# Patient Record
Sex: Male | Born: 1950
Health system: Southern US, Community
[De-identification: ages and names within clinical notes are randomized; demographics above are authoritative.]

## PROBLEM LIST (undated history)

## (undated) DIAGNOSIS — D649 Anemia, unspecified: Secondary | ICD-10-CM

## (undated) DIAGNOSIS — I2510 Atherosclerotic heart disease of native coronary artery without angina pectoris: Secondary | ICD-10-CM

## (undated) DIAGNOSIS — I6529 Occlusion and stenosis of unspecified carotid artery: Secondary | ICD-10-CM

## (undated) DIAGNOSIS — E559 Vitamin D deficiency, unspecified: Secondary | ICD-10-CM

## (undated) DIAGNOSIS — I10 Essential (primary) hypertension: Secondary | ICD-10-CM

## (undated) DIAGNOSIS — E785 Hyperlipidemia, unspecified: Secondary | ICD-10-CM

## (undated) DIAGNOSIS — E119 Type 2 diabetes mellitus without complications: Secondary | ICD-10-CM

## (undated) HISTORY — DX: Occlusion and stenosis of unspecified carotid artery: I65.29

## (undated) HISTORY — DX: Hyperlipidemia, unspecified: E78.5

## (undated) HISTORY — DX: Vitamin D deficiency, unspecified: E55.9

## (undated) HISTORY — DX: Anemia, unspecified: D64.9

## (undated) HISTORY — DX: Essential (primary) hypertension: I10

## (undated) HISTORY — DX: Type 2 diabetes mellitus without complications: E11.9

## (undated) HISTORY — DX: Atherosclerotic heart disease of native coronary artery without angina pectoris: I25.10

---

## 2000-09-17 ENCOUNTER — Encounter: Admission: RE | Admit: 2000-09-17 | Discharge: 2000-09-17 | Payer: Self-pay | Admitting: Gastroenterology

## 2000-09-17 ENCOUNTER — Encounter: Payer: Self-pay | Admitting: Gastroenterology

## 2000-12-19 ENCOUNTER — Encounter (INDEPENDENT_AMBULATORY_CARE_PROVIDER_SITE_OTHER): Payer: Self-pay | Admitting: *Deleted

## 2000-12-19 ENCOUNTER — Ambulatory Visit (HOSPITAL_COMMUNITY): Admission: RE | Admit: 2000-12-19 | Discharge: 2000-12-19 | Payer: Self-pay | Admitting: Gastroenterology

## 2003-04-13 ENCOUNTER — Ambulatory Visit (HOSPITAL_COMMUNITY): Admission: RE | Admit: 2003-04-13 | Discharge: 2003-04-13 | Payer: Self-pay | Admitting: Internal Medicine

## 2004-04-18 ENCOUNTER — Ambulatory Visit (HOSPITAL_COMMUNITY): Admission: RE | Admit: 2004-04-18 | Discharge: 2004-04-18 | Payer: Self-pay | Admitting: Internal Medicine

## 2006-03-11 HISTORY — PX: CORONARY ARTERY BYPASS GRAFT: SHX141

## 2006-07-24 ENCOUNTER — Emergency Department (HOSPITAL_COMMUNITY): Admission: EM | Admit: 2006-07-24 | Discharge: 2006-07-24 | Payer: Self-pay | Admitting: Emergency Medicine

## 2006-08-05 ENCOUNTER — Encounter: Payer: Self-pay | Admitting: Cardiology

## 2006-08-29 ENCOUNTER — Inpatient Hospital Stay (HOSPITAL_COMMUNITY): Admission: AD | Admit: 2006-08-29 | Discharge: 2006-09-08 | Payer: Self-pay | Admitting: Cardiology

## 2006-08-29 ENCOUNTER — Ambulatory Visit: Payer: Self-pay

## 2006-08-29 ENCOUNTER — Ambulatory Visit: Payer: Self-pay | Admitting: Cardiology

## 2006-09-01 ENCOUNTER — Encounter: Payer: Self-pay | Admitting: Cardiology

## 2006-09-03 ENCOUNTER — Ambulatory Visit: Payer: Self-pay | Admitting: Thoracic Surgery (Cardiothoracic Vascular Surgery)

## 2006-09-03 ENCOUNTER — Encounter: Payer: Self-pay | Admitting: Thoracic Surgery (Cardiothoracic Vascular Surgery)

## 2006-09-29 ENCOUNTER — Ambulatory Visit: Payer: Self-pay | Admitting: Thoracic Surgery (Cardiothoracic Vascular Surgery)

## 2006-09-29 ENCOUNTER — Encounter
Admission: RE | Admit: 2006-09-29 | Discharge: 2006-09-29 | Payer: Self-pay | Admitting: Thoracic Surgery (Cardiothoracic Vascular Surgery)

## 2006-09-29 ENCOUNTER — Ambulatory Visit: Payer: Self-pay | Admitting: Cardiology

## 2006-10-09 ENCOUNTER — Encounter (HOSPITAL_COMMUNITY): Admission: RE | Admit: 2006-10-09 | Discharge: 2007-01-07 | Payer: Self-pay | Admitting: Cardiology

## 2006-12-16 ENCOUNTER — Ambulatory Visit: Payer: Self-pay | Admitting: Cardiology

## 2007-04-03 ENCOUNTER — Inpatient Hospital Stay (HOSPITAL_COMMUNITY): Admission: EM | Admit: 2007-04-03 | Discharge: 2007-04-05 | Payer: Self-pay | Admitting: Emergency Medicine

## 2007-04-04 ENCOUNTER — Encounter (INDEPENDENT_AMBULATORY_CARE_PROVIDER_SITE_OTHER): Payer: Self-pay | Admitting: Internal Medicine

## 2007-04-20 ENCOUNTER — Ambulatory Visit: Payer: Self-pay | Admitting: Cardiology

## 2007-05-06 ENCOUNTER — Ambulatory Visit: Payer: Self-pay

## 2008-01-07 ENCOUNTER — Ambulatory Visit: Payer: Self-pay | Admitting: Cardiology

## 2008-01-28 ENCOUNTER — Ambulatory Visit: Payer: Self-pay

## 2008-01-28 ENCOUNTER — Encounter: Payer: Self-pay | Admitting: Cardiology

## 2008-01-28 ENCOUNTER — Encounter: Payer: Self-pay | Admitting: Cardiovascular Disease

## 2008-01-30 IMAGING — CR DG CHEST 2V
2 series · 2 of 2 positions shown · non-contrast
Comparison: 09/06/06.

CLINICAL DATA: Chest pain.  CABG three weeks ago.
 TWO VIEW CHEST:

[view not recorded (1 of 2)]
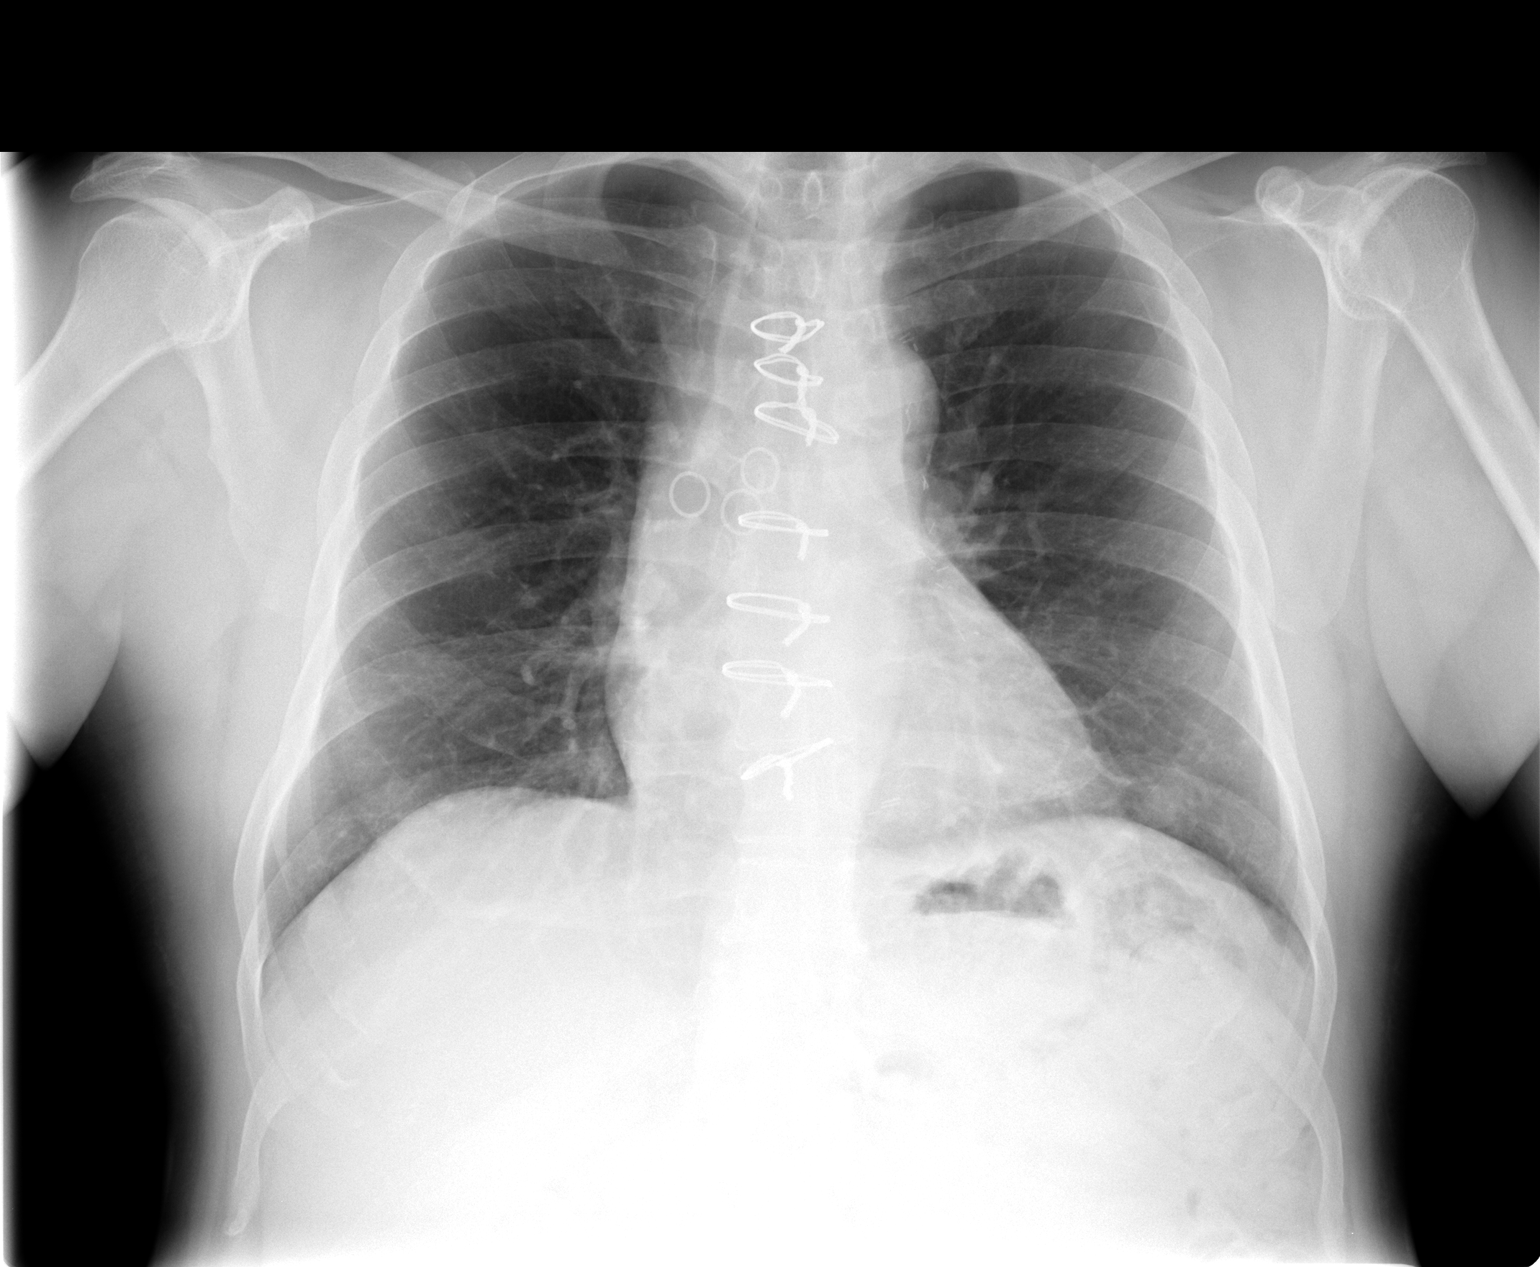

[view not recorded (2 of 2)]
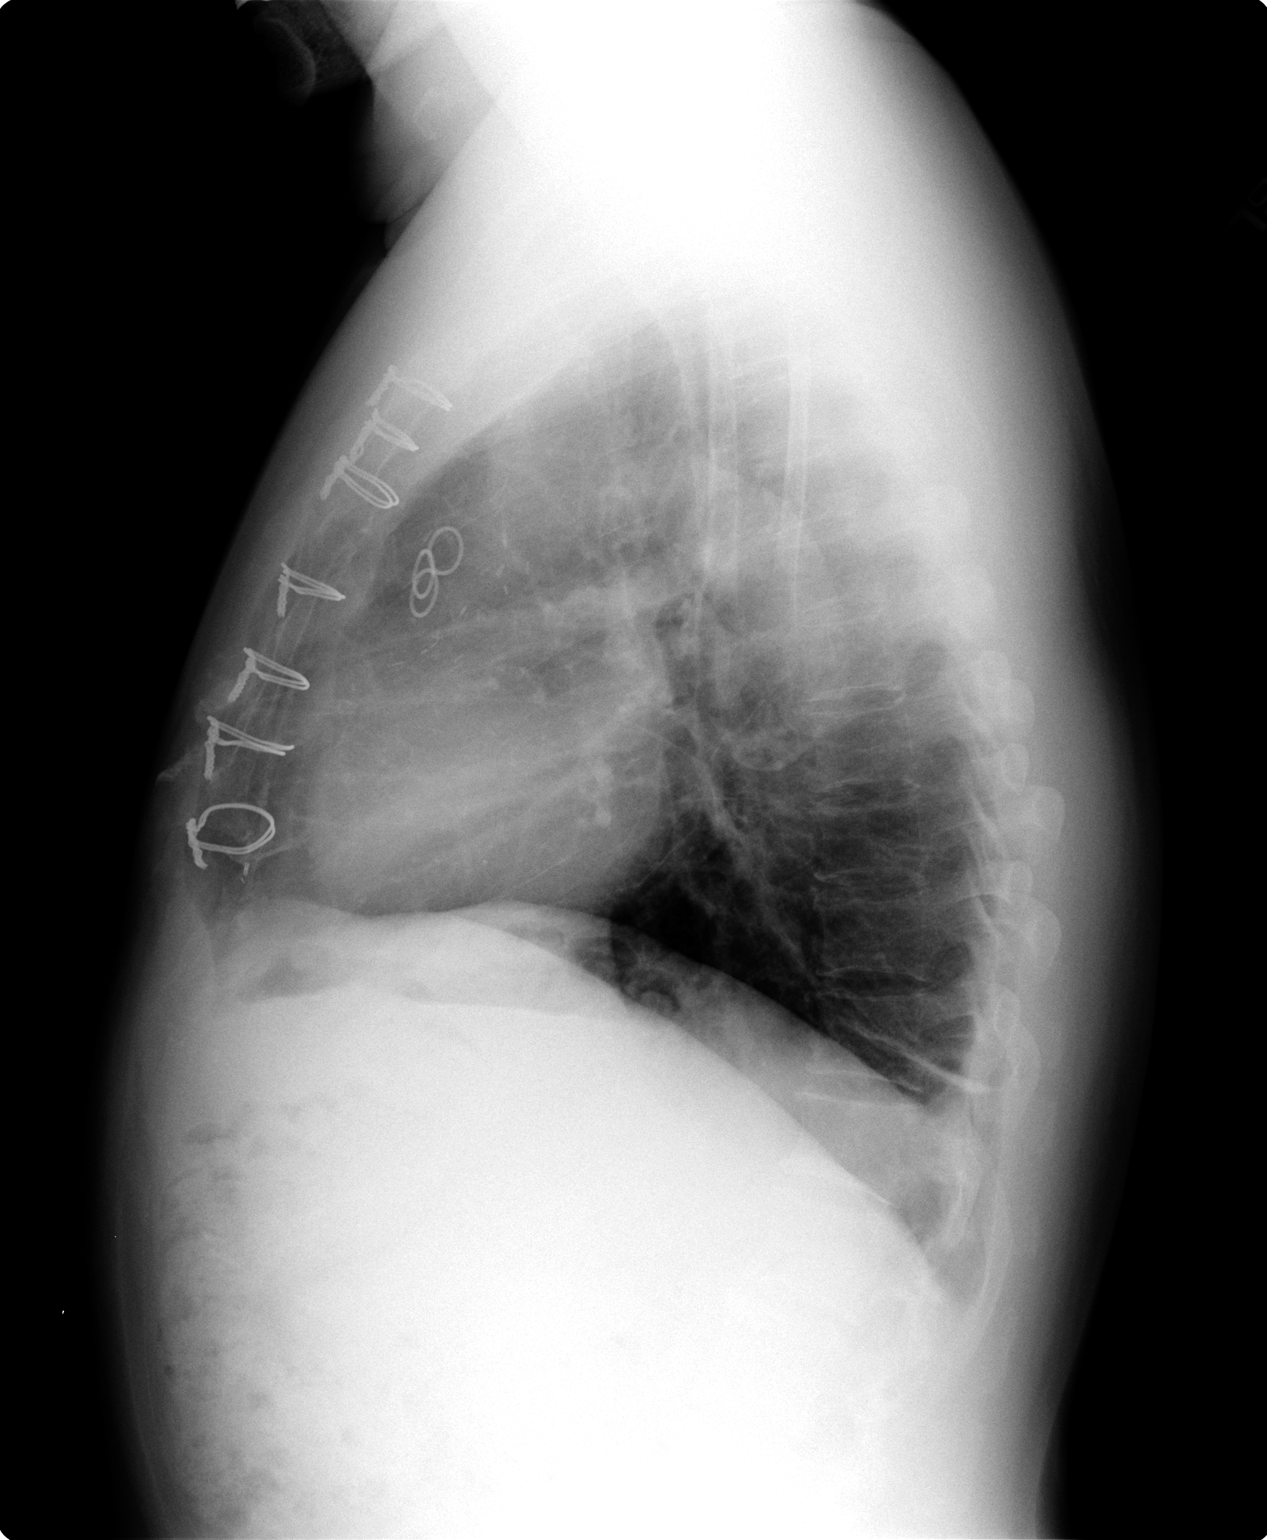

[2 of 2 positions shown; findings below may reference images not displayed]

FINDINGS: Trachea is midline.  Heart size normal.  Sternal wires are unchanged in position and alignment.  Left basilar scarring.  Lungs are otherwise clear.
IMPRESSION: No acute findings.

## 2008-09-15 ENCOUNTER — Encounter: Payer: Self-pay | Admitting: Cardiology

## 2010-03-01 DIAGNOSIS — I6529 Occlusion and stenosis of unspecified carotid artery: Secondary | ICD-10-CM | POA: Insufficient documentation

## 2010-03-01 DIAGNOSIS — I2510 Atherosclerotic heart disease of native coronary artery without angina pectoris: Secondary | ICD-10-CM

## 2010-03-01 DIAGNOSIS — E782 Mixed hyperlipidemia: Secondary | ICD-10-CM

## 2010-03-07 ENCOUNTER — Ambulatory Visit: Payer: Self-pay | Admitting: Cardiology

## 2010-03-07 ENCOUNTER — Encounter: Payer: Self-pay | Admitting: Cardiology

## 2010-03-28 ENCOUNTER — Encounter: Payer: Self-pay | Admitting: Cardiology

## 2010-03-28 ENCOUNTER — Ambulatory Visit: Admission: RE | Admit: 2010-03-28 | Discharge: 2010-03-28 | Payer: Self-pay | Source: Home / Self Care

## 2010-03-29 ENCOUNTER — Telehealth (INDEPENDENT_AMBULATORY_CARE_PROVIDER_SITE_OTHER): Payer: Self-pay | Admitting: *Deleted

## 2010-04-12 NOTE — Letter (Signed)
Summary: [facility]  [facility]   Imported By: Marylou Mccoy 03/22/2010 09:49:18  _____________________________________________________________________  External Attachment:    Type:   Image     Comment:   External Document

## 2010-04-12 NOTE — Assessment & Plan Note (Signed)
Summary: 57YR F/U MEDICATION REFILL/SL  Medications Added GLIMEPIRIDE 4 MG TABS (GLIMEPIRIDE) 1 1/2 by mouth daily METFORMIN HCL 500 MG TABS (METFORMIN HCL) 2 by mouth two times a day CRESTOR 20 MG TABS (ROSUVASTATIN CALCIUM) 1 by mouth daily WELCHOL 625 MG TABS (COLESEVELAM HCL) 3 tab by mouth two times a day ASPIRIN 81 MG  TABS (ASPIRIN) 1 by mouth daily      Allergies Added: NKDA  Visit Type:  Follow-up Primary Provider:  Dr. Marisue Brooklyn  CC:  CAD.  History of Present Illness: The patient presents for followup of his known coronary disease. Since I last saw him he has had no new cardiovascular complaints. It seems like he participates for the most part and risk reduction. He doesn't smoke. He has his lipids and diabetes followed. He doesn't exercise routinely though he walks routinely at work. He denies any ongoing symptoms such as chest pressure, neck or arm discomfort. He has no shortness of breath, PND or orthopnea. He has no palpitations, presyncope or syncope. He has had no weight gain or edema. He does complain of sneezing and sinus problems since his surgery. This has been ongoing for a year and very bothersome to him.  Current Medications (verified): 1)  Metoprolol Tartrate 25 Mg Tabs (Metoprolol Tartrate) .[name].. 1 By Mouth Two Times A Day 2)  Glimepiride 4 Mg Tabs (Glimepiride) .[name].. 1 1/2 By Mouth Daily 3)  Metformin Hcl 500 Mg Tabs (Metformin Hcl) .... 2 By Mouth Two Times A Day 4)  Crestor 20 Mg Tabs (Rosuvastatin Calcium) .[name].. 1 By Mouth Daily 5)  Welchol 625 Mg Tabs (Colesevelam Hcl) .... 3 Tab By Mouth Two Times A Day 6)  Aspirin 81 Mg  Tabs (Aspirin) .[name].. 1 By Mouth Daily  Allergies (verified): No Known Drug Allergies  Past History:  Past Medical History: Coronary artery disease 60-80% right internal carotid  artery stenosis Dyslipidemia,  Iron-deficiency anemia.   Past Surgical History: CABG (LIMA to LAD, SVG to OM, SVG to posterior or lateral, SVG to  diagonal 2008)  Review of Systems       As stated in the HPI and negative for all other systems.   Vital Signs:  Patient profile:   60 year old male Height:      61 inches Weight:      136 pounds BMI:     25.79 Pulse rate:   97 / minute Resp:     16 per minute BP sitting:   152 / 88  (right arm)  Vitals Entered By: Marrion Coy, CNA (March 07, 2010 9:58 AM)  Physical Exam  General:  Well developed, well nourished, in no acute distress. Head:  normocephalic and atraumatic Eyes:  PERRLA/EOM intact; conjunctiva and lids normal. Mouth:  Teeth, gums and palate normal. Oral mucosa normal. Neck:  Neck supple, no JVD. No masses, thyromegaly or abnormal cervical nodes. Chest Wall:  Well-healed sternotomy scar Lungs:  Clear bilaterally to auscultation and percussion. Abdomen:  Bowel sounds positive; abdomen soft and non-tender without masses, organomegaly, or hernias noted. No hepatosplenomegaly. Msk:  Back normal, normal gait. Muscle strength and tone normal. Extremities:  No clubbing or cyanosis. Neurologic:  Alert and oriented x 3. Skin:  Intact without lesions or rashes. Cervical Nodes:  no significant adenopathy Inguinal Nodes:  no significant adenopathy Psych:  Normal affect.   Detailed Cardiovascular Exam  Neck    Carotids: Carotids full and equal bilaterally without bruits.      Neck Veins: Normal, no  JVD.    Heart    Inspection: no deformities or lifts noted.      Palpation: normal PMI with no thrills palpable.      Auscultation: regular rate and rhythm, S1, S2 without murmurs, rubs, gallops, or clicks.    Vascular    Abdominal Aorta: no palpable masses, pulsations, or audible bruits.      Femoral Pulses: normal femoral pulses bilaterally.      Pedal Pulses: normal pedal pulses bilaterally.      Radial Pulses: normal radial pulses bilaterally.      Peripheral Circulation: no clubbing, cyanosis, or edema noted with normal capillary refill.      EKG  Procedure date:  03/07/2010  Findings:      Sinus rhythm, rate 101, old inferior infarct with probable posterior involvement, inferior and lateral T-wave inversions  Impression & Recommendations:  Problem # 1:  CAD (ICD-414.00) The patient has had no new anginal symptoms. We will continue with risk reduction. For that and I did discuss with him at length the benefits of walking. He tells me he has his diabetes and lipids followed closely by Dr. Elisabeth Most.  I will defer to her management with an LDL goal less than 70 and HDL greater than 40. Orders: EKG w/ Interpretation (93000)  Problem # 2:  CAROTID STENOSIS (ICD-433.10) He is overdue for carotid Dopplers. I have arrange for these to be done and discussed with him the importance of followup and management of this. He will continue with risk reduction. Orders: Carotid Duplex (Carotid Duplex)  Problem # 3:  DYSLIPIDEMIA (ICD-272.4) Lipid goals are listed above.  Problem # 4:  SINUS PAIN (ICD-478.19) This was his predominant complaint. I assured him that I did not think this was related to his cardiac situation. I have offered to refer him to an ENT and he will discuss a referral with his primary physician as he sure his insurance will require this.  Patient Instructions: 1)  Your physician recommends that you schedule a follow-up appointment in: 18 months with Dr Antoine Poche 2)  Your physician recommends that you continue on your current medications as directed. Please refer to the Current Medication list given to you today. 3)  Your physician has requested that you have a carotid duplex. This test is an ultrasound of the carotid arteries in your neck. It looks at blood flow through these arteries that supply the brain with blood. Allow one hour for this exam. There are no restrictions or special instructions.

## 2010-04-12 NOTE — Progress Notes (Signed)
   Walk in Patient Form Recieved " Pt needs Refill on Metoprolol" sent to [facility] [place]  March 29, 2010 9:40 AM

## 2010-07-24 NOTE — H&P (Signed)
William Haney, LOCKLIN NO.:  [id number]   MEDICAL RECORD NO.:  [id number]          PATIENT TYPE:  INP   LOCATION:  3743                         FACILITY:  [facility]   PHYSICIAN:  Rollene Rotunda, MD, FACCDATE OF BIRTH:  1950/08/10   DATE OF ADMISSION:  08/29/2006  DATE OF DISCHARGE:                              HISTORY & PHYSICAL   PRIMARY CARE PHYSICIAN:  Lovenia Kim, D.O.   REASON FOR PRESENTATION:  The patient was referred for exercise  treadmill test, abnormal EKG and multiple cardiovascular risk factors.  He is 60 years old.  Has had no prior cardiac history.  However, his EKG  recently demonstrated T-wave inversions in leads II, III and aVF with  possible old inferior infarct and premature ectopic complexes.  The  patient did not describe any symptoms.  He says he does some exercising  with push-ups but does not exercise routinely.  He denies any chest  pressure, neck discomfort, arm discomfort, activity  induced nausea and  vomiting, excessive  diaphoresis.  He has no shortness of breath, PND or  orthopnea.   He was on the treadmill for only the first stage of Bruce protocol.  This was for 4.7 METS and 3 minutes.  His heart rate went up very  quickly to 166 beats per minute.  He actually had a blood pressure drop  from an initial of 120 to 111 systolic.  She quickly developed ST-  segment depression in the anterior leads.  These persisted into  recovery.  He had no chest pain or shortness of breath.  Because of  this, the a treadmill test was stopped.   PAST MEDICAL HISTORY:  1. The patient has had diabetes for 10 years and seems to be poorly      controlled.  2. He also has dyslipidemia.  3. Iron-deficiency anemia.   PAST SURGICAL HISTORY:  None.   ALLERGIES:  None.   MEDICATIONS:  1. Aspirin 81 mg daily.  2. Crestor 20 mg daily.  3. Avandia 4 mg daily.  4. Glimepiride 6 mg daily.   SOCIAL HISTORY:  Works at [employer].  He is married and has  four  children.  Likes gardening.  He has smoked one half pack cigarettes per  day for 30 years, quitting 6 months ago.   FAMILY HISTORY:  Noncontributory for early coronary disease though his  father had heart disease in the 57s.   REVIEW OF SYSTEMS:  As stated in the HPI and positive for pain in of  both his knees.  Negative for all other systems.   PHYSICAL EXAMINATION:  GENERAL:  The patient is in no distress.  VITAL SIGNS:  Blood pressure 136/84, heart rate 99 and regular.  HEENT:  Pupils equal, round, reactive.  Fundi within normal.  Mucosa  normal.  NECK:  No jugular distension at 45 degrees.  Carotid upstroke brisk and  symmetrical.  No bruits, thyromegaly.  LYMPHATICS:  No cervical, axillary, inguinal.  LUNGS:  Clear to auscultation bilaterally.  BACK:  No costovertebral angle tenderness.  CHEST:  Unremarkable.  HEART:  PMI not displaced or sustained.  S1-S2 within normal limits.  No  S3, no S4, no clicks, no rubs, no murmurs.  ABDOMEN:  Flat.  Positive bowel sounds.  Normal frequency and pitch.  No  bruits, rebound, guarding.  No midline pulsatile mass.  No hepatomegaly.  No splenomegaly.  SKIN:  No rashes, no nodules.  EXTREMITIES:  2+ pulses throughout.  No edema.  No cyanosis or clubbing.  NEUROLOGIC:  Oriented to person, place, and time.  Cranial nerves  grossly intact.  Motor grossly intact.   ASSESSMENT/PLAN:  Abnormal treadmill test.  The patient had an early  positive abnormal treadmill test.  He dropped his blood pressure.  He  had ST-segment depression in the anterior leads.  He has significant  cardiovascular risk factors and a baseline abnormal EKG.  At this point  I am highly suspicious of high-grade obstructive coronary disease and I  am concerned about multivessel disease or left main.  He is not having  any symptoms.  I think it is most prudent and rule out myocardial  infarction. based on his persistent EKG changes.  He will be on heparin  and aspirin.   We will manage him with beta blockers and check risk  factors with a lipid profile.  He will need cardiac catheterization.  I  have extensively outlined the risks and benefits of this and he agrees  to proceed.  1. Dyslipidemia.  Will check a lipid profile.  Continue his Crestor      for now.  2. Diabetes.  He will continue his oral medicines.  Will cover with      sliding scale.  3. Tobacco.  He will continue to be educated about the need to remain      off of cigarettes.  4. Follow-up will be in the hospital as described.      Rollene Rotunda, MD, [facility]  Electronically Signed     JH/MEDQ  D:  08/29/2006  T:  08/30/2006  Job:  [id number]

## 2010-07-24 NOTE — Discharge Summary (Signed)
NAMEFLOYED, MASOUD NO.:  [id number]   MEDICAL RECORD NO.:  [id number]          PATIENT TYPE:  INP   LOCATION:  2005                         FACILITY:  [facility]   PHYSICIAN:  Salvatore Decent. Cornelius Moras, M.D. DATE OF BIRTH:  [birth year]   DATE OF ADMISSION:  08/29/2006  DATE OF DISCHARGE:  09/08/2006                               DISCHARGE SUMMARY   PRIMARY ADMITTING DIAGNOSIS:  Coronary artery disease.   ADDITIONAL/DISCHARGE DIAGNOSES:  1. Severe three-vessel coronary artery disease.  2. Hyperlipidemia.  3. Type 2 diabetes mellitus.  4 . Remote history of tobacco abuse.  1. A 60-80% right internal carotid artery stenosis.   PROCEDURES PERFORMED:  1. Cardiac catheterization.  2. Coronary artery bypass grafting x4 (left internal mammary artery to      the distal LAD, saphenous vein graft to the first diagonal,      saphenous vein graft to the circumflex marginal, second vein graft      to the posterolateral).  3. Endoscopic vein harvest right leg.   HISTORY OF PRESENT ILLNESS:  The patient is a 60 year old male with no  prior noted cardiac history.  He recently was seen by his family  physician for routine follow-up and was normal was noted to have EKG  abnormalities suggestive of an old inferior wall myocardial infarction.  He was subsequently referred for an exercise treadmill Cardiolite exam  which was positive and was stopped early in the first stage.  He was  seen by Dr. Antoine Poche and it was felt that he should be admitted for  cardiac catheterization and further cardiac workup.   HOSPITAL COURSE:  He was admitted to [facility] on August 29, 2006.  He did rule out for myocardial infarction.  Because of his  positive stress test, he underwent cardiac catheterization of September 01, 2006, and this showed severe three-vessel coronary artery disease with  moderate left ventricular dysfunction.  A cardiothoracic surgery  consultation was obtained, and the patient  was seen by Dr. Tressie Stalker  who also reviewed his films.  Dr. Cornelius Moras felt that his best course of  action would be to proceed with surgical revascularization at this time  given the diffuse nature of his disease.  He explained the risks,  benefits and alternatives of surgery to the patient, and he agreed to  proceed.  He remained pain free in the hospital prior to his surgery.  His preoperative workup included carotid Doppler studies which showed a  60-80% right ICA stenosis and no significant left ICA stenosis.  Also,  ABIs were greater than 1.0 bilaterally.  Pulmonary function studies  showed a normal FEV-1.  He was taken to the operating room on September 03, 2006, and underwent CABG x4 as described in detail above.  He tolerated  the procedure well and was transferred to the SICU in stable condition.  He was able to be extubated shortly after surgery.  He was  hemodynamically stable and doing well on postop day #1.  He did  initially require a low-dose Neo-Synephrine drip.  This was weaned and  discontinued over the course of his first postoperative day.  Overall,  his postoperative course has been uneventful.  His blood pressure has  remained in the 100 systolic range, and although he has been started on  a beta blocker and is tolerating it well, his blood pressure has not  been high enough to warrant starting an ACE inhibitor.  He has been  restarted on his statin therapy.  He was initially maintained on the  glucomander insulin protocol, and once his p.o. intake had begun, he was  started on Lantus and sliding scale insulin.  His preoperative  hemoglobin A1c was 8.0.  At this point, he has been restarted back on  his oral medications, although there has been some confusion as to what  he was actually taking at home.  His wife has brought in his home  medication bottles, and they have been subsequently clarified.  Despite  an oral agent, low-dose Lantus and sliding scale insulin has  continued  to run somewhat elevated CBG's in the 180-200 range and has now been  started on metformin.  She did have an episode of low grade fever the  evening of postop day #4 and early in the a.m. on postop day #5.  This  has subsequently resolved, and his white blood cell count has remained  normal.  He has no signs or symptoms on physical exam of infection.  He  is overall progressing well.  He is ambulating in the halls without  difficulty.  He has remained in sinus rhythm.  He has been diuresed back  down to his preoperative weight and has trace lower extremity edema on  physical exam.  He has been seen and evaluated by Dr. Cornelius Moras on the  morning of postop day #5, and it is felt that this time he may be  discharged home.  His most recent labs showed hemoglobin of 10.1,  hematocrit 31.4, white count 8.3, platelets 359, sodium 137, potassium  4.1, BUN 4, creatinine 0.72.   DISCHARGE MEDICATIONS:  1. Enteric-coated aspirin 81 mg daily.  2. Lopressor 25 mg b.i.d.  3. Crestor 20 mg q.h.s.  4. Lasix 40 mg daily x3 days.  5. K-Dur 20 mEq daily x3 days.  6. Metformin 500 mg daily.  7. Amaryl 6 mg daily.  8. Tylox one to two q.4 h p.r.n. for pain.   DISCHARGE INSTRUCTIONS:  He is asked to refrain from driving, heavy  lifting or strenuous activity.  He may continue ambulating daily and  using his incentive spirometer.  He may shower daily and clean his  incisions with soap and water.  He will continue low-fat, low-sodium,  carbohydrate modified diet.   DISCHARGE FOLLOWUP:  He will see Dr. Antoine Poche back in 2 weeks with a  chest x-ray.  He will see Dr. Cornelius Moras back the following week and will be  contacted by the [facility] office with an appointment.  He is asked to make  an appointment with Dr. Carmela Hurt in 1-2 weeks to recheck his blood  sugars.  In the interim, if he experiences any problems or has questions, he is asked to contact our office immediately.      Coral Ceo,  P.A.      Salvatore Decent. Cornelius Moras, M.D.  Electronically Signed    GC/MEDQ  D:  09/08/2006  T:  09/08/2006  Job:  [id number]   cc:   Rollene Rotunda, MD, [facility]  Lovenia Kim, D.O.  [facility] Office

## 2010-07-24 NOTE — Letter (Signed)
December 16, 2006     RE:  DANZIG, MACGREGOR  MRN:  [id number]  /  DOB:  Jul 07, 1950   To Whom it May Concern:   Mr. Hamid Brookens has been under my care with coronary disease requiring  bypass surgery.  He has had a good recovery and has participated in  cardiac rehab.  He is now able to return to work full time without  limitations.   Thank you for your attention to this matter.  If you have any questions,  please do not hesitate to call my office at [phone].    Sincerely,      Rollene Rotunda, MD, [facility]  Electronically Signed    JH/MedQ  DD: 12/16/2006  DT: 12/17/2006  Job #: [id number]

## 2010-07-24 NOTE — H&P (Signed)
NAMEPEPPER, WYNDHAM                ACCOUNT NO.:  [id number]   MEDICAL RECORD NO.:  [id number]          PATIENT TYPE:  INP   LOCATION:  3706                         FACILITY:  [facility]   PHYSICIAN:  Ladell Pier, M.D.   DATE OF BIRTH:  [birth year]   DATE OF ADMISSION:  04/03/2007  DATE OF DISCHARGE:                              HISTORY & PHYSICAL   CHIEF COMPLAINT:  Syncopal episode at [facility] today.   HISTORY OF PRESENT ILLNESS:  The patient is a 60 year old gentleman who  stated that he was at [employer] today shopping.  He suddenly just felt  blurred vision as if he was going to pass out.  That has happened to him  in the past when his blood sugar was low.  He tried to get something to  drink but before that, when he woke up, the ambulance was there.  He is  not sure how long he was out for.  He had no chest pain, no shortness of  breath.  The last time he passed out was in 2001 and that time it was  from low blood sugars.  He states that his blood sugar was checked and  it was 101.  Did not bite his tongue, did not lose control of his  bladder.  Does not think he was confused after the event.  No recent  travel.   PAST MEDICAL HISTORY:  Significant for coronary artery disease status  post bypass surgery.  He also has a history of 60-80% right internal  carotid artery stenosis.  He has a mildly reduced ejection fraction of  45%.  He also has dyslipidemia, iron-deficiency anemia, and diabetes.   FAMILY HISTORY:  Parents are both deceased, history unknown.   SOCIAL HISTORY:  Quit tobacco about a year ago.  No alcohol use.  He is  married with four children.  Works in Journalist, newspaper.   MEDICATIONS:  1. Aspirin 81 mg daily.  2. Lopressor 25 mg b.i.d.  3. Crestor 20 mg q.h.s.  4. Metformin 1000 mg twice daily.  5. Amaryl 6 mg daily.  6. WelChol 625 mg three tablets twice a day.   ALLERGIES:  None.   REVIEW OF SYSTEMS:  As per stated in the HPI, otherwise negative.   PHYSICAL EXAMINATION:  Temperature 97.7, blood pressure 120/84, pulse  93, respirations 19, pulse oximetry 99%.  HEENT:  Head is normocephalic, atraumatic.  Pupils reactive to light.  Throat without erythema.  CARDIOVASCULAR:  Regular rate and rhythm.  LUNGS:  Clear bilaterally.  ABDOMEN:  Positive bowel sounds.  EXTREMITIES:  Without edema.  NEUROLOGIC:  Nonfocal.   LABORATORIES:  Sodium is 136, potassium 4.7, chloride 105, BUN 14,  glucose 171.  Cardiac enzymes are negative.  WBC 9.2, hemoglobin 11.1,  MCV 65.3.  Chest x-ray:  No acute diseases.   ASSESSMENT AND PLAN:  1. Syncopal episode.  Unsure of the etiology.  Will admit him to the      hospital.  Check MRI, MRA, 2-D echocardiogram, carotid Dopplers.      Will check D-dimer.  Will monitor him on telemetry,  continue his      aspirin.  Will also check EKG.  2. Diabetes:  Will continue him on his home medication.  Will monitor      his blood sugars.  Accu-Cheks q.a.c. and q.h.s.  3. Dyslipidemia:  Will continue his cholesterol medications.  4. Coronary artery disease:  He has had no chest pain or shortness of      breath.  Continue him on all his cardiac medications.      Ladell Pier, M.D.  Electronically Signed     NJ/MEDQ  D:  04/03/2007  T:  04/04/2007  Job:  [id number]   cc:   Lovenia Kim, D.O.  Rollene Rotunda, MD, [facility]

## 2010-07-24 NOTE — Cardiovascular Report (Signed)
[name], [name] NO.:  [id number]   MEDICAL RECORD NO.:  [id number]          PATIENT TYPE:  INP   LOCATION:  3743                         FACILITY:  [facility]   PHYSICIAN:  Salvadore Farber, MD  DATE OF BIRTH:  [birth year]   DATE OF PROCEDURE:  09/01/2006  DATE OF DISCHARGE:                            CARDIAC CATHETERIZATION   PROCEDURE:  Left heart catheterization, left ventriculography, coronary  angiography.   INDICATIONS:  William Haney is a 60 year old gentleman with 10 years of  diabetes mellitus as well as hypercholesterolemia and family history of  premature atherosclerotic disease.  He quit smoking 6 months ago.  Recent EKG done by his primary care physician demonstrated Q-wave  inversions inferiorly.  That prompted referral for exercise Cardiolite  which showed impaired systolic function and was markedly positive for  ischemia.  He was, therefore, referred for diagnostic angiography.   PROCEDURAL TECHNIQUE:  Informed consent was obtained.  Under 1%  lidocaine local anesthesia, a 5-French sheath was placed in the right  common femoral artery using the modified Seldinger technique.  Diagnostic angiography and ventriculography using JL-4, JR-4, and  pigtail catheters.  The pigtail catheter was then pulled back to the  suprarenal abdominal aorta.  Abdominal aortography was performed by  power injection.  The patient tolerated the procedure well; and was  transferred to holding room in stable condition.  Sheath were removed  there.   COMPLICATIONS:  None.   FINDINGS:  1. LV:  127/3/15.  EF approximately 45% with hypokinesis of the mid      anterior wall and akinesis of the posterobasal wall and mid      inferior wall.  The inferoapex contracts normally.  2. Left main:  Calcified but no significant stenosis.  3. LAD:  Calcified proximally.  It gives rise to two diagonals.  The      proximal LAD is diffusely diseased with 70% stenosis.  The mid-LAD  is a focal 80% stenosis.  The LAD just distal to the second      diagonal has a 90% stenosis.  The first diagonal has a 95% ostial      stenosis and the second diagonal an 80% ostial stenosis.  4. Circumflex:  Moderate-sized vessel giving rise to marginals.  There      is a 30% stenosis of the ostium.  There are no other significant      stenoses.  5. RCA:  Large vessel.  There is a 98% stenosis proximally.  The      distal vessel is supplied via left-to-right collaterals as well as      antegrade flow.  6. No aortic stenosis or mitral regurgitation.  7. Renal arteries:  Single vessels bilaterally; both are normal.  8. Abdominal aorta:  Diffuse plaquing with approximately 20% stenosis      of the distal aorta just above its bifurcation.  The iliac arteries      are angiographically normal.   IMPRESSION/RECOMMENDATIONS:  The patient has severe 2-vessel coronary  disease with impaired systolic function in the setting of diabetes  mellitus.  I think  we will be best served by coronary artery bypass  grafting.  We will refer him for consideration of such.      Salvadore Farber, MD  Electronically Signed     WED/MEDQ  D:  09/01/2006  T:  09/01/2006  Job:  [id number]

## 2010-07-24 NOTE — Assessment & Plan Note (Signed)
OFFICE VISIT   William Haney, William Haney  DOB:  02-02-1951                                        September 29, 2006  CHART #:  [id number]   HISTORY OF PRESENT ILLNESS:  Mr. William Haney returns for routine followup  status post coronary artery bypass grafting x4 on September 03, 2006. His  postoperative recovery has been uneventful. Following hospital  discharge, he has continued to recover well. He has been seen in  followup by Marisue Brooklyn who looks after his diabetes, and apparently  his blood sugars have been under very good control. He returns to our  office for routine followup today. Overall, he feels well. He has mild  residual soreness in his chest. He denies any fevers, chills or  productive cough. He denies any exertional chest pain or chest  tightness. He has not had any shortness of breath. His appetite is good.  He is sleeping well at night. His activity level is quite good. He has  no complaints. His medications remain unchanged from the time of  hospital discharge.   PHYSICAL EXAMINATION:  Was notable for a well-appearing male with blood  pressure 121/83, pulse 88, oxygen saturation 98% on room air.  Examination of the chest is notable for a median sternotomy incision  that is healing nicely. The sternum is stable on palpation. Breath  sounds are clear to auscultation and symmetrical bilaterally. No wheezes  or rhonchi are noted. Cardiovascular exam includes regular rate and  rhythm. No murmurs, rubs, or gallops are noted. The abdomen is soft and  nontender. The extremities are warm and well perfused. There is no lower  extremity edema. The small incision from endoscopic vein harvest from  the right thigh has healed nicely. The remainder of his physical exam is  unrevealing.   DIAGNOSTIC TESTS:  Chest x-ray obtained today at the [facility] is reviewed. This demonstrate clear lung fields bilaterally.  There are no significant pleural effusions. All  of the sternal wires  appear intact. No other abnormalities are noted.   IMPRESSION:  Excellent progress following recent coronary artery bypass  grafting.   PLAN:  I have encouraged William Haney to continue to gradually increase  his physical therapy as tolerated. His only limitations remaining at  this point that he refrain from heavy lifting or strenuous use of his  arms or shoulders for at least another two months. I have encouraged her  to get started in the cardiac rehabilitation program. With his continued  recovery, I would except he should be able to return to work within  three months of the date of surgery. All of his questions have been  addressed. He has been reminded how important it will remain for him to  keep his diabetes under very good control indefinitely in the long run.   Salvatore Decent. Cornelius Moras, M.D.  Electronically Signed   CHO/MEDQ  D:  09/29/2006  T:  09/29/2006  Job:  [id number]   cc:   Rollene Rotunda, MD, [facility]  Lovenia Kim, D.O.

## 2010-07-24 NOTE — Assessment & Plan Note (Signed)
[facility]                            CARDIOLOGY OFFICE NOTE   William Haney, William Haney                         MRN:          [id number]  DATE:12/16/2006                            DOB:          [birth year]    PRIMARY William PHYSICIAN:  Dr. Marisue Haney.   REASON FOR PRESENTATION:  Evaluate patient who is status post CABG.   HISTORY OF PRESENT ILLNESS:  Patient is a pleasant 60 year old gentleman  with coronary disease status post CABG.  He has done well since I last  saw him.  He completed cardiac rehab last week.  He does plan on  exercising on his own.  He does not describe a chest discomfort or  shortness of breath.  He has had no palpitations, presyncope, or  syncope.  He has had PND or orthopnea.   PAST MEDICAL HISTORY:  1. Coronary artery disease (status post LIMA to the distal LAD,      saphenous vein graft to the 1st diagonal, saphenous vein graft to      the circumflex marginal, saphenous vein graft to the      posterolateral).  A 60% to 80% right internal carotid artery      stenosis, mildly reduced ejection fraction (45%).  2. Dyslipidemia.  3. Iron deficiency anemia.   ALLERGIES:  NONE.   MEDICATIONS:  1. Aspirin 81 mg daily.  2. Lopressor 25 mg b.i.d.  3. Crestor 20 mg nightly.  4. Metformin 500 mg daily.  5. Amaryl 6 mg daily.  6. Welchol 625 mg 3 tablets b.i.d.   REVIEW OF SYSTEMS:  As stated in the HPI, and otherwise negative for  other systems.   PHYSICAL EXAMINATION:  Patient is in no distress.  Blood pressure 128/78.  Heart rate 78 and regular.  Weight 141 pounds.  Body mass index 23.  HEENT:  Eyes unremarkable.  Pupils equal, round, and reactive to light.  Fundi not visualized.  Oral mucosa unremarkable.  NECK:  No jugular venous distention.  Wave form within normal limits.  Carotid upstroke brisk and symmetric.  No bruits.  No thyromegaly.  LYMPHATICS:  No cervical, axillary, or inguinal adenopathy.  LUNGS:  Clear to  auscultation bilaterally.  BACK:  No costovertebral angle tenderness.  CHEST:  Well-healed sternotomy scar.  HEART:  PMI not displaced or sustained.  S1 and S2 within normal limits.  No S3.  No S4.  No clicks.  No rubs.  No murmurs.  ABDOMEN:  Flat.  Positive bowel sounds.  Normal in frequency and pitch.  No bruits.  No rebound.  No guarding.  No midline pulsatile mass.  No  organomegaly.  SKIN:  No rashes.  No nodules.  EXTREMITIES:  Two plus pulses.  Trace right lower extremity edema.  Well-  healed endoscopic saphenous vein graft harvest site on the right.  NEUROLOGIC:  Grossly intact.   EKG:  Sinus rhythm.  Rate 78.  Axis within normal limits.  Old inferior  infarct with persistent T wave inversion.  No acute ST-T wave changes.   ASSESSMENT AND PLAN:  1. Coronary disease.  Patient is doing well status post bypass.  I      encouraged him to continue to exercise now that he has graduated on      cardiac rehab.  He will continue with aggressive risk reduction.  2. Dyslipidemia.  Per Dr. Elisabeth Haney.  The goal is an LDL less than 70,      and HDL in the 40s.  3. Diabetes mellitus.  Per Dr. Elisabeth Haney.  4. Peripheral vascular disease.  Patient probably should have carotid      Doppler in 1 year, and I will arrange this.  5. Followup.  I will see the patient back in 6 months, and then      probably yearly thereafter.     William Rotunda, MD, William Haney  Electronically Signed    JH/MedQ  DD: 12/16/2006  DT: 12/17/2006  Job #: [id number]   cc:   William Haney, D.O.

## 2010-07-24 NOTE — Consult Note (Signed)
William Haney, William Haney                ACCOUNT NO.:  [id number]   MEDICAL RECORD NO.:  [id number]          PATIENT TYPE:  INP   LOCATION:  3743                         FACILITY:  [facility]   PHYSICIAN:  Salvatore Decent. Cornelius Moras, M.D. DATE OF BIRTH:  Aug 17, 1950   DATE OF CONSULTATION:  09/01/2006  DATE OF DISCHARGE:                                 CONSULTATION   REQUESTING PHYSICIAN:  Dr. Randa Evens.   REASON FOR CONSULTATION:  Severe three-vessel coronary artery disease.   HISTORY OF PRESENT ILLNESS:  William Haney is a 60 year old male from  [place], [place], with no previous history of coronary artery  disease but risk factors notable for history of type 2 diabetes  mellitus, hyperlipidemia, and a previous history of tobacco abuse.  The  patient also has a family history of premature coronary artery disease.  The patient reports recently feeling his usual self, with no significant  problems.  However, he recently was seen in follow-up by his primary  care physician, Dr. Marisue Brooklyn, and a 12-lead electrocardiogram was  noted to have findings suggestive of old inferior wall myocardial  infarction.  He was subsequently referred for an exercise treadmill  Cardiolite exam which was early positive, notable for ST-segment  depression across the anterior leads, early during the first stage of a  standard Bruce protocol.  The patient was admitted to the hospital and  ruled out for acute myocardial infarction.  He underwent cardiac  catheterization today by Dr. Samule Ohm demonstrating severe three-vessel  coronary artery disease with moderate left ventricular dysfunction.  Cardiac surgical consultation has been requested to consider surgical  revascularization.   REVIEW OF SYSTEMS:  GENERAL:  The patient reports normal appetite.  He  states he has gained 5-10 pounds since he quit smoking 6 months ago.  He  does admit to progressive exertional fatigue in recent months.  CARDIAC:  The patient  denies any chest pain, chest tightness or chest pressure  either with activity or at rest.  In retrospect, he does report that a  couple of months ago he had suffered an episode of substernal chest  pressure that occurred in the setting of stress.  He blamed this to  stress and other factors at the time.  He denies any shortness of breath  either with activity or at rest.  He denies any PND, orthopnea, or lower  extremity edema.  He is not had any palpitations or syncope.  RESPIRATORY:  Negative.  The patient denies productive cough,  hemoptysis, wheezing.  GASTROINTESTINAL:  Negative.  The patient has no  difficulty swallowing.  He denies any hematochezia, hematemesis, melena.  MUSCULOSKELETAL:  Notable for chronic arthritis and arthralgias  afflicting both knees.  NEUROLOGIC:  Negative.  The patient denies  symptoms suggestive of previous TIA or stroke.  GENITOURINARY:  Negative.  HEENT:  Negative.   PAST MEDICAL HISTORY:  1. Type 2 diabetes mellitus.  2. Hyperlipidemia.  3. Iron deficient anemia.  4. Remote tobacco use.   PAST SURGICAL HISTORY:  None.   FAMILY HISTORY:  The patient has numerous family members with history  of  coronary artery disease including father had heart disease in his 25s  and brother who had bypass surgery.   MEDICATIONS PRIOR TO ADMISSION:  1. Aspirin 81 mg daily.  2. Crestor 20 mg daily.  3. Avandia 4 mg daily.  4. Glimepiride 6 mg daily.   DRUG ALLERGIES:  NONE KNOWN.   SOCIAL HISTORY:  The patient is married with four children.  He works  and lives here in [place], [place].  He works in a pharmacy.  He has a previous history of tobacco use, although he quit smoking 6  months ago.   PHYSICAL EXAM:  GENERAL APPEARANCE:  Notable for a well-appearing male  who appears his stated age in no acute distress.  VITAL SIGNS:  He is afebrile and most recent blood pressure measured  106/59.  HEENT:  Exam is unrevealing.  NECK:  The neck is  supple.  There is no cervical or supraclavicular  lymphadenopathy.  There is no jugular venous distension.  No carotid  bruits are noted.  CHEST:  Auscultation of the chest demonstrates clear breath sounds which  are symmetrical bilaterally.  No wheezes or rhonchi are appreciated.  CARDIOVASCULAR:  Exam includes regular rate and rhythm.  No murmurs,  rubs or gallops noted.  ABDOMEN:  The abdomen is soft and nontender.  Bowel sounds are present.  EXTREMITIES:  Warm and well-perfused.  There is no lower extremity  edema.  Distal pulses are slightly diminished but palpable in both lower  legs in the dorsalis pedis position.  SKIN:  Skin is clean, dry healthy-appearing throughout.  RECTAL AND GU:  Exams are both deferred.  NEUROLOGIC:  Examination is grossly nonfocal.   DIAGNOSTIC TEST:  Cardiac catheterization performed by Dr. Samule Ohm today  is reviewed.  This demonstrates 50% stenosis of the distal left main  coronary artery with 70% proximal stenosis of the left anterior  descending coronary artery, 80% stenosis of mid left anterior descending  coronary artery and focal 90% stenosis of the left anterior descending  coronary artery after takeoff of the second diagonal branch.  There is  99% proximal stenosis of a small first diagonal branch.  There is 50%  ostial stenosis of the left circumflex coronary artery.  There is  subtotal proximal occlusion of the right coronary artery with left-to-  right collateral filling of the posterior descending coronary artery and  its branches.  Left ventricular function is moderately reduced with  inferior basilar akinesis.  Ejection fraction is estimated between 35  and 40%.  There is no mitral regurgitation.   IMPRESSION:  Severe three-vessel coronary artery disease with moderate  left ventricular dysfunction.  William Haney is remarkably free of symptoms of present.  However, I feel he would benefit from surgical  revascularization to decrease his risk  of future cardiac event and  prolong his long-term survival.   PLAN:  I have discussed options at length with William Haney this  afternoon.  Alternative treatment strategies have been reviewed.  He  understands and accepts all associated risks of surgery including but  not limited to risk of death, stroke, myocardial infarction, congestive  heart failure, respiratory failure, pneumonia, bleeding requiring blood  transfusion, arrhythmia, infection, and recurrent coronary artery  disease.  All of his questions have been addressed.  We have also discussed his long-term prognosis with risk for recurrent  coronary artery disease in the distant future.  We have discussed the  need for tight diabetes management and the impact this  will make on his  long-term care.  All of his questions have been addressed.  We plan for  surgery Wednesday, September 03, 2006.      Salvatore Decent. Cornelius Moras, M.D.  Electronically Signed     CHO/MEDQ  D:  09/01/2006  T:  09/02/2006  Job:  [id number]   cc:   Lovenia Kim, D.O.

## 2010-07-24 NOTE — Assessment & Plan Note (Signed)
[facility]                            CARDIOLOGY OFFICE NOTE   William Haney, William Haney                         MRN:          [id number]  DATE:01/07/2008                            DOB:          1951/03/07    PRIMARY CARE PHYSICIAN:  Lovenia Kim, DO   REASON FOR PRESENTATION:  Evaluate the patient with coronary artery  disease.   HISTORY OF PRESENT ILLNESS:  The patient returns for followup of the  above.  Since I last saw him, he has had no new cardiovascular  complaints.  He exercises a couple days a week and he is active on his  feet at his job.  With this level of activity, he denies any chest  discomfort, neck, or arm discomfort.  He has had no palpitations,  presyncope, or syncope.  He has had no PND or orthopnea.  Of note, he  had a syncopal episode prior to the last appointment.  He had a drop in  his blood pressure that was probably related to this.  Since that time,  he has had no further symptoms.   PAST MEDICAL HISTORY:  Coronary artery disease (status post CABG in June  2008.  LIMA to the LAD, SVG to first diagonal, SVG to circumflex  marginal branch, SVG to posterolateral), 60-80% right internal carotid  artery stenosis, mildly reduced ejection fraction (45%), dyslipidemia,  and iron-deficiency anemia.   ALLERGIES:  None.   MEDICATIONS:  1. Aspirin 81 mg daily.  2. Crestor 20 mg nightly.  3. Amaryl 6 mg daily.  4. Welchol June 625 mg 3 tablets b.i.d.  5. Metoprolol 25 mg b.i.d.  6. Metformin 1000 mg b.i.d.   REVIEW OF SYSTEMS:  As stated in the HPI and otherwise negative for  other systems.   PHYSICAL EXAMINATION:  GENERAL:  The patient is in no distress.  VITAL SIGNS:  Blood pressure 119/83, heart rate 83 and regular, weight  140 pounds, and body mass index 23.  HEENT:  Eyes are unremarkable; pupils equal, round, and reactive to  light; fundi not visualized; oral mucosa unremarkable.  NECK:  No jugular venous distention at  45 degrees; carotid upstroke  brisk and symmetric; no bruits, no thyromegaly.  LYMPHATICS:  No cervical, axillary, or inguinal adenopathy.  LUNGS:  Clear to auscultation bilaterally.  BACK:  No costovertebral angle tenderness.  CHEST:  Well-healed sternotomy scar.  HEART:  PMI not displaced or sustained; S1 and S2 within normals limits;  no S3, no S4; no clicks, rubs, murmurs.  ABDOMEN:  Flat; positive bowel sounds; normal in frequency and pitch; no  bruits, rebound, guarding, or midline pulsatile mass; no hepatomegaly,  no splenomegaly.  SKIN:  No rashes; no nodules.  EXTREMITIES:  2+ pulses; trace lower extremity edema; well-healed  endoscopic saphenous vein graft harvest site on the right.  NEURO:  Oriented to person, place and time; cranial nerves II through XII  grossly intact; motor grossly intact.   EKG; sinus rhythm, rate 83, axis within normal limits, intervals within  normal limits, inferior infarct with a persistent inferolateral T-wave  inversions are not changed from previous EKGs, early transition lead V2  indicates posterior involvement of the inferior infarct.   ASSESSMENT AND PLAN:  1. Coronary artery disease.  The patient has had no ongoing symptoms.      No further cardiovascular testing is suggested.  I will screen him      periodically with stress perfusion study since he had no chest pain      or shortness of breath prior to his diagnosis.  2. Syncope.  He has had no further episodes of this.  No further      cardiovascular testing is suggested.  3. Cardiomyopathy.  The patient does have a reduced ejection fraction.      He has not been reassessed since his bypass.  I will go ahead and      get an echocardiogram to make sure his ejection fraction is      improved, though at least not worsened.  4. Peripheral vascular disease.  He missed a carotid Doppler and he      will get rescheduled for this.  5. Dyslipidemia, per Dr. Elisabeth Most.  He is having aggressive  therapy.      I would suggest a goal should be an LDL less than 70 and HDL      greater than 40.  6. Diabetes, per Dr. Elisabeth Most.  7. Followup.  We will see the patient back in 1 year or sooner based      on the results of the above.     Rollene Rotunda, MD, [facility]  Electronically Signed    JH/MedQ  DD: 01/07/2008  DT: 01/08/2008  Job #: [id number]   cc:   Lovenia Kim, D.O.

## 2010-07-24 NOTE — Discharge Summary (Signed)
NAMECORNELIOUS, William Haney                ACCOUNT NO.:  [id number]   MEDICAL RECORD NO.:  [id number]          PATIENT TYPE:  INP   LOCATION:  3706                         FACILITY:  [facility]   PHYSICIAN:  Ladell Pier, M.D.   DATE OF BIRTH:  [birth year]   DATE OF ADMISSION:  04/03/2007  DATE OF DISCHARGE:  04/05/2007                               DISCHARGE SUMMARY   DISCHARGE DIAGNOSES:  1. Syncopal episode.  2. Coronary artery disease, status post coronary artery bypass graft.  3. Diabetes.  4. Dyslipidemia.  5. Iron-deficiency anemia.  6. Internal carotid artery stenosis 60-80% on preoperative carotid      Doppler done in June 2008.  7. Mildly elevated creatinine kinase.   DISCHARGE MEDICATIONS:  1. Aspirin 81 mg daily.  2. Lopressor 12.5 mg twice daily.  3. Crestor 20 mg q.h.s.  4. Metformin 1000 mg twice daily.  5. Amaryl 60 mg daily.  6. Welchol 625 mg three tabs twice daily.   FOLLOWUP APPOINTMENT:  The patient is to follow up with Dr. Antoine Poche and  Dr. Marisue Brooklyn.  Follow up with Dr. Elisabeth Most on Monday or Tuesday.   PROCEDURES:  None.   CONSULTATIONS:  None.   HISTORY OF PRESENT ILLNESS:  The patient is a 60 year old gentleman who  stated that he was [employer] today shopping.  He had just come off the  night shift, only slept for 4 hours, and went to mosque and then he took  all his medicines, had a vagal.  Then he went to [employer].  When he was in  [place], he felt like his vision went black.  He could not see anything,  then he just passed out.  He is not sure how long he was out for.  He  does not remember coming around until the ambulance came.  There was not  witnessed jerking movements.  He did not loose control of his bladder or  bowels.  There was no chest pain or shortness of breath.  He stated that  this happened previously in 2000/2001, and at that time his blood sugar  was low.  His blood sugar checked by EMS at this time was 101.   PAST MEDICAL HISTORY:  Per  admission H&P.   FAMILY HISTORY:  Per admission H&P.   SOCIAL HISTORY:  Per admission H&P.   MEDICATIONS:  Per admission H&P.   ALLERGIES:  Per admission H&P.   REVIEW OF SYSTEMS:  Per admission H&P.   DISCHARGE PHYSICAL EXAMINATION:  VITAL SIGNS:  Temperature 98.4, pulse  is 87, respirations 20, blood pressure 105/75, pulse ox 94% on room air,  blood sugar 145-160.  HEENT:  Head is normocephalic atraumatic.  Pupils are equal, round, and  reactive to light.  Throat without erythema.  CARDIOVASCULAR:  Regular rate and rhythm.  LUNGS:  Clear bilaterally.  ABDOMEN:  Positive bowel sounds.  EXTREMITIES:  Without edema.   HOSPITAL COURSE:  1. Syncopal episode.  Unsure of the etiology.  His blood sugar was      normal.  His blood pressure has been running in the 90s systolic.  He said it has run in the 90s prior to him coming to the hospital      when he was in rehab at one time.  He had MRI MRA that did not show      any acute stroke.  Since it is the weekend, a carotid Doppler or 2D      echo cannot be done.  He will follow up with Dr. Elisabeth Most on      Monday to schedule his carotid Doppler and 2D echo.  He had a D-      dimer done that was normal.  A chest x-ray did not show anything      acute.  Not sure if his syncopal episode could be related to his      ICA stenosis as he has ICA stenosis on one side.  2. ICA stenosis.  The patient will follow up with PCP and/or Dr.      Antoine Poche regarding this.  3. Diabetes.  Continue him on his previous medications.  4. Dyslipidemia.  Continue him on his previous medications.  5. Anemia.  He will continue taking his iron supplement.  6. Mildly elevated creatinine kinase.  The patient's creatinine kinase      was mildly elevated.  Suspect this is secondary to the fall.  It is      trending down.  He can follow up outpatient.   DISCHARGE LABORATORY:  CK 541, MB 2.4, relative index 0.4, troponin  0.03.  Hemoglobin A1c 7.  First set of  cardiac enzymes:  CK 641, MB 2.8,  relative index 0.4, troponin 0.03.  Lipid profile:  Total cholesterol  93, triglycerides 109, HDL 31, LDL 40.  WBC 9.1, hemoglobin 10.9,  platelets 279.  D-dimer 0.37.  Urinalysis negative.  Chest x-ray:  No  acute cardiopulmonary process.  MRI MRA showed no acute intracranial  abnormality, advanced scattered white matter signal abnormality together  with multiple lacunar infarcts in the inferior cerebellum.  MRA with  possible mild atherosclerotic disease of the bilateral MCA.  Head CT was  no acute abnormality.  His EKG showed T wave inverted in the lateral  leads.      Ladell Pier, M.D.  Electronically Signed     NJ/MEDQ  D:  04/05/2007  T:  04/05/2007  Job:  [id number]   cc:   Rollene Rotunda, MD, [facility]  Lovenia Kim, D.O.

## 2010-07-24 NOTE — Op Note (Signed)
William Haney, William Haney                ACCOUNT NO.:  [id number]   MEDICAL RECORD NO.:  [id number]          PATIENT TYPE:  INP   LOCATION:  2315                         FACILITY:  [facility]   PHYSICIAN:  Salvatore Decent. Cornelius Moras, M.D. DATE OF BIRTH:  1950/08/27   DATE OF PROCEDURE:  09/03/2006  DATE OF DISCHARGE:                               OPERATIVE REPORT   PREOPERATIVE DIAGNOSIS:  Severe three vessel coronary artery disease.   POSTOPERATIVE DIAGNOSIS:  Severe three vessel coronary artery disease.   PROCEDURE:  Median sternotomy for coronary artery bypass grafting x4  (left internal mammary artery to distal left anterior descending  coronary artery, saphenous vein graft to first diagonal branch,  saphenous vein graft to circumflex marginal branch, saphenous vein graft  to right posterolateral branch, endoscopic saphenous vein harvest from  right thigh and right lower leg).   SURGEON:  Salvatore Decent. Cornelius Moras, M.D.   ASSISTANT:  Coral Ceo, P.A.-C.   ANESTHESIA:  General.   BRIEF CLINICAL NOTE:  The patient is a 60 year old male with a history  of diabetes and hyperlipidemia who was recently noted to have abnormal  electrocardiogram on routine follow-up.  He subsequently underwent a  stress test which was early positive and abnormal prompting hospital  admission.  The patient underwent elective cardiac catheterization  revealing severe three vessel coronary artery disease with mild left  ventricular dysfunction.  A full consultation note has been dictated  previously.  The patient and his family have been counseled at length  regarding the indications, risks, and potential benefits of surgery.  Alternative treatment strategies have been discussed.  They understand  and accept all associated risks of surgery and desire to proceed as  described.   OPERATIVE FINDINGS:  1. Mild left ventricular dysfunction with akinetic posterolateral wall      consistent with previous transmural myocardial  infarction  2. Good quality left internal mammary artery and saphenous vein      conduit for grafting.  3. Good quality target vessels for grafting with the exception of the      posterolateral branch of the distal right coronary artery.   OPERATIVE NOTE IN DETAIL:  The patient was brought to the operating room  on the above mentioned date and central monitoring was established by  the anesthesia service under the care and direction of Dr. Jairo Ben.  Specifically, a Swan-Ganz catheter is placed through the right  internal jugular approach.  A radial arterial line was placed.  Intravenous antibiotics were administered.  Following induction with  general endotracheal anesthesia, a Foley catheter is placed.  The  patient's chest, abdomen, both groins, and both lower extremities were  prepared and draped in a sterile manner.   Baseline transesophageal echocardiogram was performed by Dr. Jean Rosenthal.  This demonstrates mild left ventricular dysfunction.  The posterolateral  wall was akinetic.  The inferior wall was hypokinetic.  The remainder of  the left ventricle appears to contract normally.  No other significant  abnormalities are appreciated.   A median sternotomy incision is performed and the left internal mammary  artery is dissected from  the chest wall and prepared for bypass  grafting.  The left internal mammary artery is a good quality conduit.  Simultaneously, saphenous vein was obtained from the patient's right  thigh and the upper portion of the right lower leg using endoscopic vein  harvest technique.  The saphenous vein is a good quality conduit.  After  the saphenous vein is removed, the small incisions in the right lower  extremity are closed in multiple layers with running absorbable suture.  The patient is heparinized systemically.  The left internal mammary  artery is transected distally and noted to have excellent flow.   The pericardium is opened.  The ascending  aorta is normal in appearance.  The ascending aorta and the right atrium were cannulated for  cardiopulmonary bypass.  Adequate heparinization is verified.  Cardiopulmonary bypass is begun and the surface of the heart was  inspected.  There are dense adhesions along the posterolateral wall of  the left ventricle between the visceral and epicardial surface of the  pericardium in the vicinity of previous transmural myocardial  infarction.  These adhesions were divided sharply.  The underlying  posterolateral wall is completely chronically infarcted with very thin  wall scar tissue.  There is no aneurysm.  Distal sites are selected for  coronary bypass grafting.  Of note, the dominant terminal branch of the  distal right coronary arteries system is a posterolateral branch which  perfuses the posterolateral wall.  The posterior descending coronary  artery is quite small and the distal right coronary artery, itself, is  chronically occluded.  A temperature probe is placed in the left  ventricular septum.  A cardioplegic catheter was placed in the ascending  aorta.   The patient is allowed to cool passively to 32 degrees systemic  temperature.  The aortic crossclamp was applied and cold blood  cardioplegia is administered initially in antegrade fashion through the  aortic root.  Iced saline slush was applied for topical hypothermia.  The initial cardioplegic arrest and myocardial cooling is felt to be  excellent.  Repeat doses of cardioplegia are administered intermittently  throughout the crossclamp portion of the operation through the aortic  root and down the subsequently placed vein grafts to maintain left  ventricular septal temperature below 15 degrees centigrade.   The following distal coronary anastomoses were performed:  1. The posterolateral branch of the distal right coronary artery is      grafted with a saphenous vein graft in an end-to-side fashion.     This vessel was 1.5 mm  in diameter but is a poor quality target      vessel due to no significant runoff.  A 1 mm probe will pass      distally.  Neither probe will pass proximally as the distal right      coronary artery and its branches are chronically occluded.  2. The circumflex marginal branch is grafted with a saphenous vein      graft in an end-to-side fashion.  This vessel measures 2 mm in      diameter and is a good quality target vessel for grafting.  3. The first diagonal branch of the left anterior descending coronary      artery is grafted with a saphenous vein graft in an end-to-side      fashion.  This vessel measured 1.3 mm in diameter and is a good      quality target vessel at the site of distal grafting, although it  is diffusely diseased proximally.  4. The distal left anterior descending coronary artery is grafted with      the left internal mammary artery in an end-to-side fashion.  This      vessel was diffusely diseased and the distal anastomoses is placed      quite far towards the apex of the heart.  However, at the site of      distal grafting, it measures 1.5 mm in diameter and is a good      quality target vessel.   All three proximal saphenous vein anastomoses were performed directly to  the ascending aorta prior to removal of the aortic crossclamp.  The left  ventricular septal temperature rises rapidly with reperfusion of the  left internal mammary artery.  The aortic crossclamp was removed after  total crossclamp time of 68 minutes.  The heart is defibrillated and  subsequently normal sinus rhythm resumes spontaneously.  All proximal  and distal coronary anastomoses are inspected for hemostasis and  appropriate graft orientation.  Epicardial pacing wires were fixed to  the right ventricular outflow tract and to the right atrial appendage.  The patient is rewarmed to 37 degrees centigrade temperature.  The  patient weaned from cardiopulmonary bypass without difficulty.   The  patient's rhythm at separation from bypass is normal sinus rhythm.  No  inotropic support is required.  Total cardiopulmonary bypass time for  the operation is 89 minutes.  Follow up transesophageal echocardiogram  performed by Dr. Jean Rosenthal after separation from bypass demonstrates  preserved left ventricular function with no significant changes.   The venous and arterial cannulae are removed uneventfully.  Protamine is  administered to reverse the anticoagulation.  The mediastinum and left  chest are irrigated with saline solution containing vancomycin.  Meticulous surgical hemostasis ascertained.  The mediastinum and left  chest are drained with three chest tubes exited through separate stab  incisions inferiorly.  The soft tissues and pericardium are  reapproximated loosely anterior to the aorta.   The On-Q continuous pain management system was utilized to facilitate  postoperative pain control.  Two 10 inch catheters supplied with the On- Q kit are tunneled into the deep subcutaneous tissues and positioned  just lateral to the lateral border of the sternum on either side.  Each  catheter is flushed with 5 mL of 0.5% bupivacaine solution and  ultimately connected to a continuous infusion pump.  The sternum was  closed with double strength sternal wire.  The soft tissues anterior to  the sternum are closed in multiple layers and the skin is closed with a  running subcuticular skin closure.   The patient tolerated the procedure well and was transported to the  surgical intensive care unit in stable condition.  There are no  intraoperative complications.  All sponge, instrument and needle counts  were verified correct at the completion of the operation.  The patient  was transfused 2 units packed red blood cells during cardiopulmonary  bypass due to iron deficiency anemia which was present preoperatively  and exacerbated by hemodilution during surgery.      Salvatore Decent. Cornelius Moras,  M.D.  Electronically Signed     CHO/MEDQ  D:  09/03/2006  T:  09/03/2006  Job:  [id number]   cc:   Rollene Rotunda, MD, [facility]  Salvadore Farber, MD  Lovenia Kim, D.O.

## 2010-07-24 NOTE — Assessment & Plan Note (Signed)
[facility]                            CARDIOLOGY OFFICE NOTE   ARSHIA, RONDON                         MRN:          [id number]  DATE:04/20/2007                            DOB:          1951/02/05    PRIMARY CARE PHYSICIAN:  Lovenia Kim, D.O.   REASON FOR PRESENTATION:  Evaluate patient with recent hospitalization  for syncope.   HISTORY OF PRESENT ILLNESS:  The the patient is a pleasant 60 year old  gentleman with coronary disease, as described below.  He was  hospitalized on April 03, 2007 with syncope.  This happened while he  was standing in line at [facility].  There were some exacerbating factors,  as he had not eaten that morning.  He was up on his feet.  He had not  slept much.  He was walking around and felt things go black and then  went down.  He did not have any other prodrome.  He knew where he was  when he came to.  It is not clear how long he was down.  There was no  seizure activity witnessed.  EMS was called.  His blood sugar was  checked, and it was fine.  He did not have orthostatic blood pressure  drops in the ER.  He had no further cardiovascular testing.  He had no  stress testing, although I suspect he was monitored on telemetry.   Since then, he has had some lightheadedness, like a fullness in his  head.  He has not had any near episodes of syncope or syncope.  He has  not had any palpitations.  He denies any chest discomfort, neck or arm  discomfort.  He has had no shortness of breath.  Denies any PND or  orthopnea.  He does get a little lightheaded, particularly when he  stands.  He did have a lower blood pressure apparently in the hospital  and had his dose of beta-blocker reduced.  He has been checking his  blood pressure at home, and his pressures have actually been in the  160s.  However, at Dr. Hardie Pulley office and here they are normal.  He  is not sure if his blood pressure cuff is accurate.   PAST MEDICAL  HISTORY:  1. Coronary artery disease (status post LIMA to the LAD, SVG to first      diagonal, SVG to the circumflex marginal, SVG to posterolateral).  2. 60%-80% right internal carotid artery stenosis,  3. Mildly reduced ejection fraction (45%).  4. Dyslipidemia.  5. Iron-deficiency anemia.   ALLERGIES:  None.   CURRENT MEDICATIONS:  1. Aspirin 81 mg daily.  2. Lopressor 12.5 mg b.i.d.  3. Crestor 20 mg at bedtime.  4. Metformin 500 mg b.i.d.  5. Amaryl 6 mg daily.  6. Welchol 625 mg 3 tablets b.i.d..   REVIEW OF SYSTEMS:  As stated in the HPI, otherwise negative for other  systems.   PHYSICAL EXAMINATION:  GENERAL:  The patient is in no distress.  VITAL SIGNS:  Blood pressure 128/88, heart rate 70 and regular, weight  144 pounds,  body mass index 23.  HEENT:  Eyes unremarkable.  Pupils equal, round, and reactive to light.  Fundi not visualized.  Oral mucosa unremarkable.  NECK:  No jugular venous distention at 45 degrees.  Carotid upstroke  brisk and symmetric, no bruits, no thyromegaly.  LYMPHATIC:  No cervical, axillary, or inguinal adenopathy.  LUNGS:  Clear to auscultation bilaterally.  BACK:  No costovertebral angle tenderness.  CHEST:  Well-healed sternotomy scar.  HEART:  PMI not displaced or sustained.  S1 and S2 within normal limits,  no S3, no S4, no clicks, no rubs, no murmurs.  ABDOMEN:  Flat, positive bowel sounds, normal in frequency and pitch, no  bruits, no rebound, no guarding, no midline pulsatile mass, no  hepatomegaly, no splenomegaly.  SKIN:  No rashes, no nodules.  EXTREMITIES:  2+ pulses, trace lower extremity edema, well-healed  endoscopic saphenous vein graft harvest site on the right.  NEUROLOGIC:  Grossly intact.   ASSESSMENT AND PLAN:  1. Syncope.  The patient had a syncopal episode.  He did have mild      orthostasis in the office today.  (Blood pressure dropped from      112/73 to 103/75 upon standing.  It did rebound after 2-5 minutes.       He did feel dizzy with the blood pressure drop.)  It sounded like a      vagal episode, with perhaps some orthostatic component.  At this      point, I described maneuvers to avoid a precipitous drop in blood      pressure.  I have asked him to pump his legs and sit slowly.  If      this persists, I would probably prescribe compression stockings.  I      do not think any further cardiovascular testing is suggested at      this point.  I do agree that his blood pressure is running on the      lower side, and that he should stay on a reduced dose of beta      blocker.  2. Hypertension.  Blood pressure has been low actually.  This is      mentioned above.  It is not clear whether his blood pressure cuff      at home is accurate, and he is going to bring it somewhere to have      been verified, as he says he is running high at home.  Once he      keeps a blood pressure diary with an accurate cuff, we will be able      to make a determination of what to do next.  3. Peripheral vascular disease.  He does have carotid stenosis      already, as described above.  We were going to get Dopplers in the      spring.  We will get the now.  4. Dyslipidemia.  Per Dr. Elisabeth Most, with a goal LDL of less than 70      and HDL greater than 40.  5. Diabetes.  The patient is on oral medicines per Dr. Elisabeth Most.  6. Coronary disease.  No further cardiovascular sting is suggested.      He will continue with risk      reduction.  7. Followup.  I will see him back in April 2009.     Rollene Rotunda, MD, [facility]  Electronically Signed    JH/MedQ  DD: 04/20/2007  DT: 04/21/2007  Job #: [id number]  cc:   Lovenia Kim, D.O.

## 2010-07-24 NOTE — Assessment & Plan Note (Signed)
[facility]                            CARDIOLOGY OFFICE NOTE   SAMMUEL, William Haney                         MRN:          [id number]  DATE:09/29/2006                            DOB:          [birth year]    PRIMARY CARE PHYSICIAN:  Lovenia Kim, D.O.   REASON FOR PRESENTATION:  Evaluate patient with coronary disease status  post CABG.   HISTORY OF PRESENT ILLNESS:  The patient presents for followup of the  above.  He did very well with bypass surgery.  He is recovering nicely  and has some mild incisional pain.  He saw Dr. Cornelius Moras today and had a  chest x-ray which he reports was okay. He has had no symptoms consistent  with angina, though he never had any of these symptoms before.  He has  had no shortness of breath.  He has done a little walking for exercise.  He has not been having any PND or orthopnea.  He has not been having any  palpitations, presyncope, or syncope. He has had no fevers or chills.  He has had some mild right leg swelling.   PAST MEDICAL HISTORY:  1. Coronary artery disease (catheterization demonstrated left main no      significant stenosis, LAD diffuse 70% followed by mid 80% followed      by 90%, first diagonal 95% ostial stenosis, second diagonal 80%      ostial stenosis, circumflex 30% stenosis at the ostium.  The right      coronary artery had greater than 90% proximal stenosis and then had      left-to-right collaterals.  The EF was approximately 45% with      hypokinesis.  The patient underwent CABG by Dr. Cornelius Moras with a LIMA to      the distal LAD, saphenous vein graft to first diagonal, saphenous      vein graft to circumflex marginal, saphenous vein graft to      posterolateral).  2. A 60-80% right internal carotid artery stenosis.  3. Diabetes mellitus.  4. Dyslipidemia.  5. Iron-deficiency anemia.   ALLERGIES:  None.   MEDICATIONS:  1. Aspirin 81 mg daily.  2. Lopressor 25 mg b.i.d.  3. Crestor 20 mg daily.  4. Metformin 500 mg daily.  5. Amaryl 6 mg daily.  6. Welchol 625 mg 3 tablets b.i.d.   REVIEW OF SYSTEMS:  As stated in the HPI, otherwise negative for all  other systems.   PHYSICAL EXAMINATION:  GENERAL:  The patient is in no acute distress.  VITAL SIGNS:  Blood pressure 119/82, heart rate 81 and regular, weight  136 pounds, body mass index 23.  HEENT:  Eyelids unremarkable.  Pupils equal, round, and reactive to  light.  Fundi not visualized.  Oral mucosa unremarkable.  NECK:  No jugular venous distention at 45 degrees.  Carotid upstroke  brisk and symmetric.  No bruits, no thyromegaly.  LYMPHATICS:  No cervical, axillary, or inguinal adenopathy.  LUNGS:  Clear to auscultation bilaterally.  BACK:  No costovertebral angle tenderness.  CHEST:  Well-healed sternotomy scar.  HEART:  PMI not displaced or sustained.  S1 and S2 within normal limits.  No S3, no S4, no clicks, no rubs, no murmurs.  ABDOMEN:  Flat, positive bowel sounds normal in frequency and pitch.  No  bruits, rebound, guarding, or midline pulsatile mass.  No hepatomegaly  or splenomegaly.  SKIN:  No rashes, no nodules.  EXTREMITIES:  2+ pulses, trace right lower extremity edema, well-healed  endoscopic saphenous vein graft harvest site on the right.  NEUROLOGIC:  Oriented to person, place, and time.  Cranial nerves II-XII  grossly intact.  Motor grossly intact.   EKG:  Sinus rhythm, rate 82, axis within normal limits, inferior T wave  inversion, inferior Q waves, and old inferior infarct.  No acute  changes.   ASSESSMENT AND PLAN:  1. Coronary disease.  The patient is doing well status post coronary      artery bypass grafting.  We are going to practice aggressive      secondary risk reduction.  2. Mild reduced ejection fraction.  His blood pressure is marginal.      In the future, I am going to add an ACE inhibitor for its multiple      benefits.  For now, we are going to keep a watch on his blood      pressure  while at rehab and see if he can start this at any point.  3. Dyslipidemia.  When he comes back to see me, I will make sure he      gets a lipid profile and liver enzymes if he has not had one      before.  The goal will be an LDL of less than 70 and HDL in the      40s.  4. Diabetes per Dr. Elisabeth Most.  We discussed the need for closer      followup of this.  5. Followup.  I will see him back in 3 months or sooner if needed.     Rollene Rotunda, MD, [facility]  Electronically Signed    JH/MedQ  DD: 09/29/2006  DT: 09/29/2006  Job #: [id number]   cc:   Lovenia Kim, D.O.

## 2010-07-27 NOTE — Procedures (Signed)
[place]. [facility]  Patient:    BRYDEN, DARDEN Visit Number: [id number] MRN: [id number]          Service Type: Attending:  Petra Kuba, M.D. Dictated by:   Petra Kuba, M.D. Proc. Date: 12/19/00   CC:         Ammie Dalton, M.D.                           Procedure Report  PROCEDURE:  Colonoscopy.  INDICATION:  Anemia, questionable etiology.  Consent was signed after risks, benefits, methods, and options thoroughly discussed in the office.  MEDICATIONS:  Demerol 60 mg, Versed 6 mg.  DESCRIPTION OF PROCEDURE:  Rectal inspection was pertinent for small internal hemorrhoids.  Digital exam was negative.  The pediatric video colonoscope was inserted and fairly easily advanced around the colon to the cecum, which did require rolling him on his back and some abdominal pressure.  The cecum was identified by the appendiceal orifice and the ileocecal valve.  No abnormalities were seen on insertion, nor was there any blood.  The scope was inserted a short way in the terminal ileum, which was normal.  Photo documentation was obtained.  The scope was slowly withdrawn.  The prep was adequate.  There was some liquid stool that required washing and suctioning, but on slow withdrawal through the colon, no abnormalities were seen.  Once back in the rectum, the scope was retroflexed, pertinent for some small internal hemorrhoids.  The scope was straightened and readvanced a short way up the sigmoid, air was suctioned, the scope removed.  The patient tolerated the procedure well.  There was no obvious complication.  ENDOSCOPIC DIAGNOSES: 1. Small internal-external hemorrhoids. 2. Otherwise negative exam to the terminal ileum.  PLAN:  Continue workup with an EGD.  Repeat screening in five to 10 years or p.r.n. Dictated by:   Petra Kuba, M.D. Attending:  Petra Kuba, M.D. DD:  12/19/00 TD:  12/19/00 Job: [id number]

## 2010-07-27 NOTE — Procedures (Signed)
[place]. [facility]  Patient:    LYNDON, CHAPEL Visit Number: [id number] MRN: [id number]          Service Type: Attending:  Petra Kuba, M.D. Dictated by:   Petra Kuba, M.D. Proc. Date: 12/19/00   CC:         Osie Cheeks. Christakos, M.D.                           Procedure Report  PROCEDURE:  Esophagogastroduodenoscopy with biopsy.  INDICATION:  Patient with questionable history of ulcers, anemia, and nondiagnostic colonoscopy.  Want to recheck the upper end and rule out any abnormality.  Consent was signed after risks, benefits, methods, options thoroughly discussed in the office.  MEDICATIONS:  Additional 20 mg Demerol, 2 mg Versed, since this followed the colonoscopy.  DESCRIPTION OF PROCEDURE:  The video endoscope was inserted by direct vision. The esophagus was normal.  In the distal esophagus was a small hiatal hernia. The scope passed into the stomach and advanced through a normal pylorus into a normal duodenal bulb, around the C-loop to a normal second portion of the duodenum.  The scope was withdrawn back to the bulb, and a good look there ruled out ulcers in that location.  The scope was withdrawn back to the stomach and retroflexed.  High in the cardia, a small hiatal hernia was confirmed.  The fundus, angularis, lesser and greater curve were evaluated on retroflex visualization.  Other than some minimal gastritis and antritis, no other abnormalities were seen.  The scope was straightened, and straight visualization in the stomach did not reveal any additional findings.  The scope was advanced to the antrum, and one biopsy for the CLOtest was obtained to rule out Helicobacter.  The scope was then reinserted into the second portion of the duodenum.  Again no abnormalities were seen, including a good look at the bulb, and scattered second portion of the duodenum biopsies were obtained to rule out any malabsorption.  The scope was then slowly  withdrawn back to the stomach, which was re-evaluated on straight and retroflex visualization without additional findings.  Air was suctioned and the scope slowly withdrawn.  Again a good look at the esophagus on slow withdrawal was normal except for a small hiatal hernia.  The scope was removed.  The patient tolerated the procedure well.  There was no obvious immediate complication.  ENDOSCOPIC DIAGNOSES: 1. Small hiatal hernia. 2. Minimal gastritis, status post CLO biopsy. 3. Otherwise within normal limits EGD, status post duodenal biopsy to rule out    malabsorption.  PLAN:  Await pathology, recheck CBC and smear.  Possibly will need a hematology consult.  Possibly will need to recheck guaiacs just to make sure no further workup plans are needed.  Would consider a one-time small bowel follow-through and CT if needed. Dictated by:   Petra Kuba, M.D. Attending:  Petra Kuba, M.D. DD:  12/19/00 TD:  12/19/00 Job: [id number]

## 2010-11-05 ENCOUNTER — Other Ambulatory Visit (HOSPITAL_COMMUNITY): Payer: Self-pay | Admitting: Internal Medicine

## 2010-11-05 ENCOUNTER — Ambulatory Visit (HOSPITAL_COMMUNITY)
Admission: RE | Admit: 2010-11-05 | Discharge: 2010-11-05 | Disposition: A | Discharge status: Home / Self Care | Payer: Managed Care, Other (non HMO) | Source: Ambulatory Visit | Attending: Internal Medicine | Admitting: Internal Medicine

## 2010-11-05 ENCOUNTER — Encounter: Payer: Self-pay | Admitting: Cardiology

## 2010-11-05 DIAGNOSIS — Z0000 Encounter for general adult medical examination without abnormal findings: Secondary | ICD-10-CM

## 2010-11-05 DIAGNOSIS — I10 Essential (primary) hypertension: Secondary | ICD-10-CM

## 2010-11-05 DIAGNOSIS — E119 Type 2 diabetes mellitus without complications: Secondary | ICD-10-CM | POA: Insufficient documentation

## 2010-11-09 ENCOUNTER — Encounter: Payer: Self-pay | Admitting: Cardiology

## 2010-11-29 LAB — URINALYSIS, ROUTINE W REFLEX MICROSCOPIC
Bilirubin Urine: NEGATIVE
Ketones, ur: NEGATIVE
Leukocytes, UA: NEGATIVE
Nitrite: NEGATIVE
Protein, ur: NEGATIVE
Urobilinogen, UA: 0.2
pH: 5.5

## 2010-11-29 LAB — LIPID PANEL
Cholesterol: 93
LDL Cholesterol: 40
Total CHOL/HDL Ratio: 3

## 2010-11-29 LAB — CBC
HCT: 34.8 — ABNORMAL LOW
Hemoglobin: 11.1 — ABNORMAL LOW
MCHC: 31.1
Platelets: 279
Platelets: 319
RBC: 5.32
RBC: 5.34
WBC: 9.1
WBC: 9.2

## 2010-11-29 LAB — CARDIAC PANEL(CRET KIN+CKTOT+MB+TROPI)
CK, MB: 2.4
CK, MB: 2.8
Relative Index: 0.4
Total CK: 641 — ABNORMAL HIGH
Troponin I: 0.03
Troponin I: 0.03

## 2010-11-29 LAB — TSH: TSH: 1.144

## 2010-11-29 LAB — BASIC METABOLIC PANEL
CO2: 26
Calcium: 8.9
Chloride: 102
GFR calc Af Amer: >60
Sodium: 134 — ABNORMAL LOW

## 2010-11-29 LAB — DIFFERENTIAL
Eosinophils Relative: 6 — ABNORMAL HIGH
Lymphs Abs: 1.8
Monocytes Relative: 6

## 2010-11-29 LAB — HEMOGLOBIN A1C: Mean Plasma Glucose: 172

## 2010-11-29 LAB — I-STAT 8, (EC8 V) (CONVERTED LAB)
BUN: 14
Bicarbonate: 21.3
HCT: 41
Hemoglobin: 13.9
Operator id: 294501
Sodium: 136
TCO2: 23

## 2010-11-29 LAB — D-DIMER, QUANTITATIVE

## 2010-11-29 LAB — POCT CARDIAC MARKERS: Myoglobin, poc: 301

## 2010-11-29 LAB — CK TOTAL AND CKMB (NOT AT ARMC): Relative Index: 0.7

## 2010-12-26 LAB — CBC
HCT: 25.7 — ABNORMAL LOW
HCT: 28.1 — ABNORMAL LOW
HCT: 28.8 — ABNORMAL LOW
HCT: 33.9 — ABNORMAL LOW
HCT: 34 — ABNORMAL LOW
HCT: 34 — ABNORMAL LOW
HCT: 34.7 — ABNORMAL LOW
Hemoglobin: 10.1 — ABNORMAL LOW
Hemoglobin: 10.7 — ABNORMAL LOW
Hemoglobin: 10.7 — ABNORMAL LOW
Hemoglobin: 11.4 — ABNORMAL LOW
Hemoglobin: 8.2 — ABNORMAL LOW
Hemoglobin: 8.9 — ABNORMAL LOW
Hemoglobin: 9.1 — ABNORMAL LOW
MCHC: 31
MCHC: 31.5
MCHC: 31.6
MCHC: 31.7
MCHC: 31.7
MCHC: 31.8
MCHC: 32.1
MCHC: 32.1
MCHC: 32.3
MCHC: 32.3
MCV: 64.4 — ABNORMAL LOW
MCV: 64.6 — ABNORMAL LOW
MCV: 64.7 — ABNORMAL LOW
MCV: 65.7 — ABNORMAL LOW
MCV: 69.3 — ABNORMAL LOW
MCV: 69.6 — ABNORMAL LOW
MCV: 70.2 — ABNORMAL LOW
MCV: 70.3 — ABNORMAL LOW
Platelets: 130 — ABNORMAL LOW
Platelets: 166
Platelets: 262
Platelets: 287
Platelets: 297
Platelets: 301
Platelets: 312
Platelets: 359
Platelets: 97 — ABNORMAL LOW
RBC: 3.71 — ABNORMAL LOW
RBC: 4.01 — ABNORMAL LOW
RBC: 4.11 — ABNORMAL LOW
RBC: 5.14
RBC: 5.25
RBC: 5.33
RBC: 5.6
RDW: 17.3 — ABNORMAL HIGH
RDW: 17.4 — ABNORMAL HIGH
RDW: 17.6 — ABNORMAL HIGH
RDW: 17.6 — ABNORMAL HIGH
RDW: 23.8 — ABNORMAL HIGH
RDW: 23.9 — ABNORMAL HIGH
RDW: 23.9 — ABNORMAL HIGH
RDW: 24.2 — ABNORMAL HIGH
RDW: 24.8 — ABNORMAL HIGH
WBC: 10.3
WBC: 11.1 — ABNORMAL HIGH
WBC: 11.6 — ABNORMAL HIGH
WBC: 6.1
WBC: 8
WBC: 8.5
WBC: 8.9

## 2010-12-26 LAB — BASIC METABOLIC PANEL
BUN: 4 — ABNORMAL LOW
CO2: 26
CO2: 28
CO2: 28
Calcium: 7.8 — ABNORMAL LOW
Calcium: 8.7
Calcium: 9
Chloride: 100
Chloride: 107
Creatinine, Ser: 0.72
Creatinine, Ser: 0.72
GFR calc Af Amer: >60
GFR calc Af Amer: >60
GFR calc Af Amer: >60
GFR calc non Af Amer: >60
GFR calc non Af Amer: >60
Glucose, Bld: 64 — ABNORMAL LOW
Glucose, Bld: 84
Glucose, Bld: 86
Potassium: 3.8
Potassium: 4.1
Potassium: 4.2
Potassium: 4.4
Sodium: 135
Sodium: 136
Sodium: 137

## 2010-12-26 LAB — COMPREHENSIVE METABOLIC PANEL
ALT: 25
Alkaline Phosphatase: 56
BUN: 6
BUN: 7
CO2: 25
CO2: 27
Calcium: 8.9
Chloride: 105
Creatinine, Ser: 0.74
Creatinine, Ser: 0.84
GFR calc non Af Amer: >60
GFR calc non Af Amer: >60
Glucose, Bld: 166 — ABNORMAL HIGH
Glucose, Bld: 242 — ABNORMAL HIGH
Potassium: 4.4
Sodium: 133 — ABNORMAL LOW
Total Bilirubin: 0.9
Total Protein: 6.3

## 2010-12-26 LAB — POCT I-STAT 3, ART BLOOD GAS (G3+)
Acid-base deficit: 2
Bicarbonate: 22.7
Bicarbonate: 26.8 — ABNORMAL HIGH
O2 Saturation: 100
Operator id: 277261
Patient temperature: 35.8
TCO2: 24
TCO2: 28
pCO2 arterial: 42.7
pH, Arterial: 7.347 — ABNORMAL LOW
pH, Arterial: 7.41

## 2010-12-26 LAB — TYPE AND SCREEN: Antibody Screen: NEGATIVE

## 2010-12-26 LAB — LIPID PANEL
Cholesterol: 124
HDL: 36 — ABNORMAL LOW
LDL Cholesterol: 41
LDL Cholesterol: 67
Total CHOL/HDL Ratio: 3.4
Triglycerides: 127
VLDL: 21
VLDL: 25

## 2010-12-26 LAB — IRON AND TIBC
Saturation Ratios: 49
TIBC: 377
UIBC: 193

## 2010-12-26 LAB — DIFFERENTIAL
Basophils Absolute: 0.2 — ABNORMAL HIGH
Basophils Relative: 2 — ABNORMAL HIGH
Lymphocytes Relative: 30
Neutro Abs: 6.1
Neutrophils Relative %: 55

## 2010-12-26 LAB — HEPARIN LEVEL (UNFRACTIONATED)
Heparin Unfractionated: 0.24 — ABNORMAL LOW
Heparin Unfractionated: 0.38
Heparin Unfractionated: 0.51
Heparin Unfractionated: 0.58
Heparin Unfractionated: 0.63

## 2010-12-26 LAB — PROTIME-INR
INR: 1
INR: 1.1
Prothrombin Time: 13.3
Prothrombin Time: 14.4
Prothrombin Time: 20.8 — ABNORMAL HIGH

## 2010-12-26 LAB — POCT I-STAT 4, (NA,K, GLUC, HGB,HCT)
Glucose, Bld: 135 — ABNORMAL HIGH
Glucose, Bld: 165 — ABNORMAL HIGH
Glucose, Bld: 176 — ABNORMAL HIGH
HCT: 23 — ABNORMAL LOW
HCT: 25 — ABNORMAL LOW
HCT: 26 — ABNORMAL LOW
Hemoglobin: 10.9 — ABNORMAL LOW
Hemoglobin: 8.8 — ABNORMAL LOW
Operator id: 3402
Operator id: 3402
Operator id: 3402
Potassium: 3.4 — ABNORMAL LOW
Potassium: 3.6
Potassium: 3.8
Potassium: 4
Sodium: 135
Sodium: 136

## 2010-12-26 LAB — URINALYSIS, ROUTINE W REFLEX MICROSCOPIC
Glucose, UA: 500 — AB
Hgb urine dipstick: NEGATIVE
Ketones, ur: NEGATIVE
Protein, ur: NEGATIVE
Urobilinogen, UA: 0.2

## 2010-12-26 LAB — CARDIAC PANEL(CRET KIN+CKTOT+MB+TROPI)
CK, MB: 1.6
CK, MB: 2.1
Total CK: 65
Troponin I: 0.04

## 2010-12-26 LAB — CREATININE, SERUM
Creatinine, Ser: 0.67
GFR calc Af Amer: >60
GFR calc Af Amer: >60

## 2010-12-26 LAB — I-STAT 8, (EC8 V) (CONVERTED LAB)
BUN: 6
Chloride: 98
Potassium: 4.6
pCO2, Ven: 52.8 — ABNORMAL HIGH
pH, Ven: 7.316 — ABNORMAL HIGH

## 2010-12-26 LAB — BLOOD GAS, ARTERIAL
Acid-Base Excess: 1
Bicarbonate: 25.1 — ABNORMAL HIGH
TCO2: 26.4
pCO2 arterial: 40.7

## 2010-12-26 LAB — RETICULOCYTES
RBC.: 5.3
Retic Count, Absolute: 84.8
Retic Ct Pct: 1.6

## 2010-12-26 LAB — TSH: TSH: 1.05

## 2010-12-26 LAB — I-STAT EC8
BUN: 5 — ABNORMAL LOW
Bicarbonate: 21.3
HCT: 29 — ABNORMAL LOW
Operator id: 277261
pCO2 arterial: 43.1

## 2010-12-26 LAB — HEMOGLOBIN A1C: Hgb A1c MFr Bld: 8 — ABNORMAL HIGH

## 2010-12-26 LAB — PLATELET COUNT: Platelets: 184

## 2010-12-26 LAB — APTT: aPTT: 64 — ABNORMAL HIGH

## 2011-05-07 ENCOUNTER — Other Ambulatory Visit: Payer: Self-pay | Admitting: Cardiology

## 2011-05-07 NOTE — Telephone Encounter (Signed)
..   Requested Prescriptions   Pending Prescriptions Disp Refills  . metoprolol tartrate (LOPRESSOR) 25 MG tablet [Pharmacy Med Name: METOPROLOL TART 25MG TAB] 60 tablet 6    Sig: TAKE ONE TABLET BY MOUTH TWICE DAILY   

## 2011-06-19 ENCOUNTER — Other Ambulatory Visit: Payer: Self-pay | Admitting: *Deleted

## 2011-06-19 DIAGNOSIS — I6529 Occlusion and stenosis of unspecified carotid artery: Secondary | ICD-10-CM

## 2011-06-21 ENCOUNTER — Encounter (INDEPENDENT_AMBULATORY_CARE_PROVIDER_SITE_OTHER): Payer: Managed Care, Other (non HMO)

## 2011-06-21 DIAGNOSIS — I6529 Occlusion and stenosis of unspecified carotid artery: Secondary | ICD-10-CM

## 2011-07-22 ENCOUNTER — Encounter: Payer: Self-pay | Admitting: *Deleted

## 2011-08-22 ENCOUNTER — Ambulatory Visit: Payer: Managed Care, Other (non HMO) | Admitting: Cardiology

## 2011-08-29 ENCOUNTER — Encounter: Payer: Self-pay | Admitting: Cardiology

## 2011-08-29 ENCOUNTER — Ambulatory Visit (INDEPENDENT_AMBULATORY_CARE_PROVIDER_SITE_OTHER): Payer: Managed Care, Other (non HMO) | Admitting: Cardiology

## 2011-08-29 VITALS — BP 158/94 | HR 98 | Ht 61.0 in | Wt 134.0 lb

## 2011-08-29 DIAGNOSIS — E785 Hyperlipidemia, unspecified: Secondary | ICD-10-CM

## 2011-08-29 DIAGNOSIS — E1159 Type 2 diabetes mellitus with other circulatory complications: Secondary | ICD-10-CM | POA: Insufficient documentation

## 2011-08-29 DIAGNOSIS — E119 Type 2 diabetes mellitus without complications: Secondary | ICD-10-CM

## 2011-08-29 DIAGNOSIS — I6529 Occlusion and stenosis of unspecified carotid artery: Secondary | ICD-10-CM

## 2011-08-29 DIAGNOSIS — I2510 Atherosclerotic heart disease of native coronary artery without angina pectoris: Secondary | ICD-10-CM

## 2011-08-29 NOTE — Assessment & Plan Note (Signed)
We discussed the fact he does not know what his hemoglobin A1c is. I reviewed with him the importance of risk factor modification and participating in diet lifestyle changes and medical management.

## 2011-08-29 NOTE — Patient Instructions (Addendum)
The current medical regimen is effective;  continue present plan and medications.  Your physician has requested that you have an exercise tolerance test. For further information please visit [email or web address]. Please also follow instruction sheet, as given.  Follow up in 1 year with Dr Hochrein.  You will receive a letter in the mail 2 months before you are due.  Please call us when you receive this letter to schedule your follow up appointment.  

## 2011-08-29 NOTE — Progress Notes (Signed)
   HPI The patient presents for followup of his known coronary disease status post CABG. Since I last saw him he has done well. He denies any chest pressure, neck or arm discomfort. 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 He denies any new shortness of breath, PND or orthopnea. He has no new palpitations, presyncope or syncope. He's had no weight gain or edema. He does try to walk about 3 times per week. However, he's not consistent about exercise.  No Known Allergies  Current Outpatient Prescriptions  Medication Sig Dispense Refill  . aspirin 81 MG tablet Take 81 mg by mouth daily.      . colesevelam (WELCHOL) 625 MG tablet Take 1,875 mg by mouth 2 (two) times daily with a meal. 3 tabs bid      . glimepiride (AMARYL) 4 MG tablet Take 4 mg by mouth daily before breakfast. Take 1-1/2 tab daily      . metFORMIN (GLUCOPHAGE) 500 MG tablet Take 500 mg by mouth daily with breakfast. 4 tabs daily      . metoprolol tartrate (LOPRESSOR) 25 MG tablet TAKE ONE TABLET BY MOUTH TWICE DAILY  60 tablet  6  . rosuvastatin (CRESTOR) 20 MG tablet Take 20 mg by mouth daily.        Past Medical History  Diagnosis Date  . Coronary artery disease   . Carotid artery stenosis     60 %-80 %  . Dyslipidemia   . Anemia   . Sinus pain   . Syncope     Past Surgical History  Procedure Date  . Coronary artery bypass graft     ROS:  As stated in the HPI and negative for all other systems.  PHYSICAL EXAM BP 158/94  Pulse 98  Ht 5\' 1"  (1.549 m)  Wt 134 lb (60.782 kg)  BMI 25.32 kg/m2 GENERAL:  Well appearing HEENT:  Pupils equal round and reactive, fundi not visualized, oral mucosa unremarkable NECK:  No jugular venous distention, waveform within normal limits, carotid upstroke brisk and symmetric, no bruits, no thyromegaly LYMPHATICS:  No cervical, inguinal adenopathy LUNGS:  Clear to auscultation bilaterally BACK:  No CVA tenderness CHEST:  Well healed sternotomy scar. HEART:  PMI  not displaced or sustained,S1 and S2 within normal limits, no S3, no S4, no clicks, no rubs, no murmurs ABD:  Flat, positive bowel sounds normal in frequency in pitch, no bruits, no rebound, no guarding, no midline pulsatile mass, no hepatomegaly, no splenomegaly EXT:  2 plus pulses throughout, no edema, no cyanosis no clubbing SKIN:  No rashes no nodules NEURO:  Cranial nerves II through XII grossly intact, motor grossly intact throughout [facility]:  Cognitively intact, oriented to person place and time  EKG: Sinus rhythm, rate 98, axis within normal limits, intervals within normal limits, old inferior posterior infarct, inferolateral T wave inversions unchanged from previous 08/29/2011  ASSESSMENT AND PLAN

## 2011-08-29 NOTE — Assessment & Plan Note (Signed)
I want his LDL to be less than 70 and his HDL to be greater than 40. I will obtain the results from his primary provider to review. We discussed this at length.

## 2011-08-29 NOTE — Assessment & Plan Note (Signed)
He needs to participate more in risk reduction. I will bring the patient back for a POET (Plain Old Exercise Test). This will allow me to screen for obstructive coronary disease, risk stratify and very importantly provide a prescription for exercise.

## 2011-08-29 NOTE — Assessment & Plan Note (Signed)
EF 60-79% right stenosi s and 40-59% LAD stenosis on Doppler in April and will have this repeated next year.

## 2011-10-28 ENCOUNTER — Encounter: Payer: Self-pay | Admitting: Nurse Practitioner

## 2011-10-28 ENCOUNTER — Ambulatory Visit (INDEPENDENT_AMBULATORY_CARE_PROVIDER_SITE_OTHER): Payer: Managed Care, Other (non HMO) | Admitting: Nurse Practitioner

## 2011-10-28 VITALS — BP 147/98 | HR 87

## 2011-10-28 DIAGNOSIS — I2510 Atherosclerotic heart disease of native coronary artery without angina pectoris: Secondary | ICD-10-CM

## 2011-10-28 NOTE — Procedures (Signed)
Exercise Treadmill Test  Pre-Exercise Testing Evaluation Rhythm: normal sinus  Rate: 87   PR:  .16 QRS:  .08  QT:  .34 QTc: .41     Test  Exercise Tolerance Test Ordering MD: Angelina Sheriff, MD  Interpreting MD: Norma Fredrickson , NP  Unique Test No: 1  Treadmill:  1  Indication for ETT: known ASHD  Contraindication to ETT: No   Stress Modality: exercise - treadmill  Cardiac Imaging Performed: non   Protocol: standard Bruce - maximal  Max BP: 183/85  Max MPHR (bpm):  157 85% MPR (bpm):  133  MPHR obtained (bpm):  162 % MPHR obtained:  103%  Reached 85% MPHR (min:sec):  2:30 Total Exercise Time (min-sec):  6:00  Workload in METS: 7.0 Borg Scale: 15  Reason ETT Terminated:  desired heart rate attained    ST Segment Analysis At Rest: Resting EKG with inferolateral T wave changes.  With Exercise: no evidence of significant ST depression  Other Information Arrhythmia:  No Angina during ETT:  absent (0) Quality of ETT:  diagnostic  ETT Interpretation:  normal - no evidence of ischemia by ST analysis  Comments: Patient presents today for routine GXT. Has known CAD with remote CABG. Other problems include DM, HLD and HTN. Does not exercise routinely. Works [employer]pm to [employer].   He exercised on the standard Bruce protocol for a total of 6 minutes. He has reduced exercise tolerance. His target was achieved at 2:30. The test was stopped due to fatigue. No chest pain reported. EKG is abnormal at rest with inferolateral T wave changes. No ischemia with exercise. Adequate blood pressure response.   Recommendations: We have discussed the need for a regular exercise program. He does seem motivated. Goal is 45 to 60 minutes per day. We will see him back as scheduled next June. Patient is agreeable to this plan and will call if any problems develop in the interim.

## 2011-12-11 ENCOUNTER — Encounter: Payer: Self-pay | Admitting: Cardiology

## 2012-06-05 ENCOUNTER — Other Ambulatory Visit: Payer: Self-pay | Admitting: Cardiology

## 2012-06-08 ENCOUNTER — Other Ambulatory Visit: Payer: Self-pay | Admitting: *Deleted

## 2012-06-08 MED ORDER — METOPROLOL TARTRATE 25 MG PO TABS: ORAL_TABLET | ORAL | Status: DC

## 2012-07-03 ENCOUNTER — Encounter: Payer: Managed Care, Other (non HMO) | Admitting: Internal Medicine

## 2012-12-10 ENCOUNTER — Other Ambulatory Visit (HOSPITAL_COMMUNITY): Payer: Self-pay | Admitting: Internal Medicine

## 2012-12-10 ENCOUNTER — Ambulatory Visit (HOSPITAL_COMMUNITY)
Admission: RE | Admit: 2012-12-10 | Discharge: 2012-12-10 | Disposition: A | Discharge status: Home / Self Care | Payer: Managed Care, Other (non HMO) | Source: Ambulatory Visit | Attending: Internal Medicine | Admitting: Internal Medicine

## 2012-12-10 ENCOUNTER — Encounter: Payer: Self-pay | Admitting: Cardiology

## 2012-12-10 DIAGNOSIS — Z951 Presence of aortocoronary bypass graft: Secondary | ICD-10-CM | POA: Insufficient documentation

## 2012-12-10 DIAGNOSIS — R05 Cough: Secondary | ICD-10-CM

## 2012-12-10 DIAGNOSIS — R059 Cough, unspecified: Secondary | ICD-10-CM | POA: Insufficient documentation

## 2012-12-25 ENCOUNTER — Encounter: Payer: Self-pay | Admitting: Cardiology

## 2013-01-15 ENCOUNTER — Other Ambulatory Visit: Payer: Self-pay | Admitting: Internal Medicine

## 2013-01-26 ENCOUNTER — Other Ambulatory Visit: Payer: Self-pay

## 2013-01-26 DIAGNOSIS — E871 Hypo-osmolality and hyponatremia: Secondary | ICD-10-CM

## 2013-01-27 LAB — BASIC METABOLIC PANEL WITH GFR
BUN: 11 mg/dL (ref 6–23)
CO2: 28 mEq/L (ref 19–32)
Calcium: 9.2 mg/dL (ref 8.4–10.5)
Chloride: 99 mEq/L (ref 96–112)
Creat: 0.87 mg/dL (ref 0.50–1.35)
GFR, Est African American: >89 mL/min
GFR, Est Non African American: >89 mL/min
Glucose, Bld: 148 mg/dL — ABNORMAL HIGH (ref 70–99)

## 2013-01-28 ENCOUNTER — Ambulatory Visit (HOSPITAL_COMMUNITY)
Admission: RE | Admit: 2013-01-28 | Discharge: 2013-01-28 | Disposition: A | Discharge status: Home / Self Care | Payer: Managed Care, Other (non HMO) | Source: Ambulatory Visit | Attending: Emergency Medicine | Admitting: Emergency Medicine

## 2013-01-28 DIAGNOSIS — Z951 Presence of aortocoronary bypass graft: Secondary | ICD-10-CM | POA: Insufficient documentation

## 2013-01-28 DIAGNOSIS — I2510 Atherosclerotic heart disease of native coronary artery without angina pectoris: Secondary | ICD-10-CM | POA: Insufficient documentation

## 2013-01-28 DIAGNOSIS — I6529 Occlusion and stenosis of unspecified carotid artery: Secondary | ICD-10-CM | POA: Insufficient documentation

## 2013-01-28 DIAGNOSIS — D649 Anemia, unspecified: Secondary | ICD-10-CM | POA: Insufficient documentation

## 2013-01-28 DIAGNOSIS — E871 Hypo-osmolality and hyponatremia: Secondary | ICD-10-CM

## 2013-01-28 DIAGNOSIS — E119 Type 2 diabetes mellitus without complications: Secondary | ICD-10-CM | POA: Insufficient documentation

## 2013-03-17 ENCOUNTER — Encounter: Payer: Self-pay | Admitting: Internal Medicine

## 2013-03-22 ENCOUNTER — Ambulatory Visit: Payer: Self-pay | Admitting: Emergency Medicine

## 2013-03-24 ENCOUNTER — Ambulatory Visit: Payer: Self-pay | Admitting: Emergency Medicine

## 2013-03-25 ENCOUNTER — Encounter: Payer: Self-pay | Admitting: Emergency Medicine

## 2013-03-25 ENCOUNTER — Ambulatory Visit (INDEPENDENT_AMBULATORY_CARE_PROVIDER_SITE_OTHER): Payer: BC Managed Care – PPO | Admitting: Emergency Medicine

## 2013-03-25 VITALS — BP 124/80 | HR 76 | Temp 98.2°F | Resp 16 | Ht 61.5 in | Wt 145.0 lb

## 2013-03-25 DIAGNOSIS — Z79899 Other long term (current) drug therapy: Secondary | ICD-10-CM

## 2013-03-25 DIAGNOSIS — I10 Essential (primary) hypertension: Secondary | ICD-10-CM

## 2013-03-25 DIAGNOSIS — E119 Type 2 diabetes mellitus without complications: Secondary | ICD-10-CM

## 2013-03-25 DIAGNOSIS — E559 Vitamin D deficiency, unspecified: Secondary | ICD-10-CM

## 2013-03-25 DIAGNOSIS — M25561 Pain in right knee: Secondary | ICD-10-CM

## 2013-03-25 DIAGNOSIS — E782 Mixed hyperlipidemia: Secondary | ICD-10-CM

## 2013-03-25 DIAGNOSIS — M25562 Pain in left knee: Secondary | ICD-10-CM

## 2013-03-25 DIAGNOSIS — R809 Proteinuria, unspecified: Secondary | ICD-10-CM

## 2013-03-25 NOTE — Progress Notes (Signed)
Subjective:    Patient ID: William Haney, male    DOB: 04-03-1950, 63 y.o.   MRN: [id number]  HPI Comments: 63 YO MALE presents for 3 month F/U for HTN, Cholesterol, DM, D. Deficient. LAST LABS SODIUM 129- 134 WITH NEG CXR T 168 TG 202 L 88 A1C 7.3  D 37 he is not exercising routinely due to knee pain. He notes he is eating a little better. He notes BS good at home. He notes BP 120s/80s.  He notes both knees ache, Right > left. He has not seen an otrtho. He notes exercise makes them worse. He notes occasional mild edema. He denies injury/ trauma.   Hyperlipidemia  Hypertension  Diabetes   Current Outpatient Prescriptions on File Prior to Visit  Medication Sig Dispense Refill  . aspirin 81 MG tablet Take 81 mg by mouth daily.      . colesevelam (WELCHOL) 625 MG tablet Take 1,875 mg by mouth 2 (two) times daily with a meal. 3 tabs bid      . glimepiride (AMARYL) 4 MG tablet Take 4 mg by mouth daily before breakfast. Take 1-1/2 tab daily      . metFORMIN (GLUCOPHAGE) 500 MG tablet TAKE 2 TABLETS BY MOUTH EVERY MORNING AND TAKE 2 TABLET EVERY EVENING  120 tablet  3  . metoprolol tartrate (LOPRESSOR) 25 MG tablet TAKE ONE TABLET BY MOUTH TWICE DAILY  60 tablet  4  . Multiple Vitamin (MULTIVITAMIN) tablet Take 1 tablet by mouth daily.       No current facility-administered medications on file prior to visit.   ALLERGIES Statins  Past Medical History  Diagnosis Date  . Coronary artery disease     s/p CABG in 2008  . Carotid artery stenosis     60 %-80 %  . Dyslipidemia   . Anemia   . Sinus pain   . Syncope   . Diabetes mellitus type II   . Hypertension   . Hyperlipidemia   . Vitamin D deficiency        Review of Systems  Musculoskeletal: Positive for arthralgias, gait problem and joint swelling.  All other systems reviewed and are negative.   BP 124/80  Pulse 76  Temp(Src) 98.2 F (36.8 C) (Temporal)  Resp 16  Ht 5' 1.5" (1.562 m)  Wt 145 lb (65.772 kg)  BMI  26.96 kg/m2     Objective:   Physical Exam  Nursing note and vitals reviewed. Constitutional: He is oriented to person, place, and time. He appears well-developed and well-nourished.  HENT:  Head: Normocephalic and atraumatic.  Right Ear: External ear normal.  Left Ear: External ear normal.  Nose: Nose normal.  Mouth/Throat: No oropharyngeal exudate.  Eyes: Conjunctivae and EOM are normal.  Neck: Normal range of motion. Neck supple. No JVD present. No thyromegaly present.  Cardiovascular: Normal rate, regular rhythm, normal heart sounds and intact distal pulses.   Pulmonary/Chest: Effort normal and breath sounds normal.  Abdominal: Soft. Bowel sounds are normal. He exhibits no distension and no mass. There is no tenderness. There is no rebound and no guarding.  Musculoskeletal: Normal range of motion. He exhibits tenderness. He exhibits no edema.  Severe crepitus bilateral. Tenderness with ROM Bilateral. + Difficulty with initiating gait. Walks with limp  Lymphadenopathy:    He has no cervical adenopathy.  Neurological: He is alert and oriented to person, place, and time. He has normal reflexes. No cranial nerve deficit. Coordination normal.  Skin: Skin is warm and  dry.  Psychiatric: He has a normal mood and affect. His behavior is normal. Judgment and thought content normal.          Assessment & Plan:  1. 3 month F/U for HTN, Cholesterol,DM, D. Deficient. Needs healthy diet, cardio QD and obtain healthy weight. Check Labs, Check BP if >130/80 call office, Check BS if >200 call office.   2.Knee pain bilateral- Ref to Ortho  3. Microalbumin elevated- recheck lab

## 2013-03-25 NOTE — Patient Instructions (Signed)
Knee Pain Knee pain can be a result of an injury or other medical conditions. Treatment will depend on the cause of your pain. HOME CARE  Only take medicine as told by your doctor.  Keep a healthy weight. Being overweight can make the knee hurt more.  Stretch before exercising or playing sports.  If there is constant knee pain, change the way you exercise. Ask your doctor for advice.  Make sure shoes fit well. Choose the right shoe for the sport or activity.  Protect your knees. Wear kneepads if needed.  Rest when you are tired. GET HELP RIGHT AWAY IF:   Your knee pain does not stop.  Your knee pain does not get better.  Your knee joint feels hot to the touch.  You have a fever. MAKE SURE YOU:   Understand these instructions.  Will watch this condition.  Will get help right away if you are not doing well or get worse. Document Released: 05/24/2008 Document Revised: 05/20/2011 Document Reviewed: 05/24/2008 [employer] Patient Information 2014 [employer].  

## 2013-03-26 LAB — HEPATIC FUNCTION PANEL
ALT: 20 U/L (ref 0–53)
AST: 16 U/L (ref 0–37)
Albumin: 4.2 g/dL (ref 3.5–5.2)
Alkaline Phosphatase: 58 U/L (ref 39–117)
BILIRUBIN DIRECT: 0.1 mg/dL (ref 0.0–0.3)
BILIRUBIN INDIRECT: 0.4 mg/dL (ref 0.0–0.9)
Total Bilirubin: 0.5 mg/dL (ref 0.3–1.2)
Total Protein: 6.7 g/dL (ref 6.0–8.3)

## 2013-03-26 LAB — LIPID PANEL
Cholesterol: 111 mg/dL (ref 0–200)
HDL: 40 mg/dL (ref >39)
LDL CALC: 51 mg/dL (ref 0–99)
TRIGLYCERIDES: 98 mg/dL (ref <150)
Total CHOL/HDL Ratio: 2.8 Ratio
VLDL: 20 mg/dL (ref 0–40)

## 2013-03-26 LAB — CBC WITH DIFFERENTIAL/PLATELET
BASOS ABS: 0.1 10*3/uL (ref 0.0–0.1)
Basophils Relative: 1 % (ref 0–1)
EOS PCT: 11 % — AB (ref 0–5)
Eosinophils Absolute: 0.9 10*3/uL — ABNORMAL HIGH (ref 0.0–0.7)
HCT: 36.7 % — ABNORMAL LOW (ref 39.0–52.0)
Hemoglobin: 11.3 g/dL — ABNORMAL LOW (ref 13.0–17.0)
Lymphocytes Relative: 23 % (ref 12–46)
Lymphs Abs: 1.9 10*3/uL (ref 0.7–4.0)
MCH: 20 pg — ABNORMAL LOW (ref 26.0–34.0)
MCHC: 30.8 g/dL (ref 30.0–36.0)
MCV: 65.1 fL — ABNORMAL LOW (ref 78.0–100.0)
Monocytes Absolute: 0.8 10*3/uL (ref 0.1–1.0)
Monocytes Relative: 10 % (ref 3–12)
NEUTROS ABS: 4.7 10*3/uL (ref 1.7–7.7)
Neutrophils Relative %: 55 % (ref 43–77)
PLATELETS: 323 10*3/uL (ref 150–400)
RBC: 5.64 MIL/uL (ref 4.22–5.81)
RDW: 16.8 % — AB (ref 11.5–15.5)
WBC: 8.4 10*3/uL (ref 4.0–10.5)

## 2013-03-26 LAB — MICROALBUMIN / CREATININE URINE RATIO
Creatinine, Urine: 90.7 mg/dL
Microalb Creat Ratio: 8 mg/g (ref 0.0–30.0)
Microalb, Ur: 0.73 mg/dL (ref 0.00–1.89)

## 2013-03-26 LAB — BASIC METABOLIC PANEL WITH GFR
BUN: 12 mg/dL (ref 6–23)
CO2: 27 mEq/L (ref 19–32)
CREATININE: 0.91 mg/dL (ref 0.50–1.35)
Calcium: 9.2 mg/dL (ref 8.4–10.5)
Chloride: 100 mEq/L (ref 96–112)
GFR, EST NON AFRICAN AMERICAN: 89 mL/min
Glucose, Bld: 129 mg/dL — ABNORMAL HIGH (ref 70–99)
Potassium: 5.4 mEq/L — ABNORMAL HIGH (ref 3.5–5.3)
Sodium: 134 mEq/L — ABNORMAL LOW (ref 135–145)

## 2013-03-26 LAB — INSULIN, FASTING: INSULIN FASTING, SERUM: 10 u[IU]/mL (ref 3–28)

## 2013-03-26 LAB — HEMOGLOBIN A1C
Hgb A1c MFr Bld: 8.6 % — ABNORMAL HIGH (ref <5.7)
MEAN PLASMA GLUCOSE: 200 mg/dL — AB (ref <117)

## 2013-03-26 LAB — URIC ACID: Uric Acid, Serum: 4.9 mg/dL (ref 4.0–7.8)

## 2013-03-31 NOTE — Progress Notes (Signed)
SCHEDULED PT FOR 2/27TH 9AM

## 2013-04-10 ENCOUNTER — Other Ambulatory Visit: Payer: Self-pay | Admitting: Internal Medicine

## 2013-05-04 ENCOUNTER — Other Ambulatory Visit: Payer: Self-pay | Admitting: Internal Medicine

## 2013-05-07 ENCOUNTER — Telehealth: Payer: Self-pay | Admitting: Internal Medicine

## 2013-05-07 ENCOUNTER — Ambulatory Visit: Payer: Self-pay | Admitting: Emergency Medicine

## 2013-05-07 NOTE — Telephone Encounter (Signed)
CALLED PT TO [place] FROM 05-07-13

## 2013-06-17 ENCOUNTER — Other Ambulatory Visit: Payer: Self-pay | Admitting: Cardiology

## 2013-06-22 ENCOUNTER — Other Ambulatory Visit: Payer: Self-pay | Admitting: Emergency Medicine

## 2013-06-27 ENCOUNTER — Other Ambulatory Visit: Payer: Self-pay | Admitting: Emergency Medicine

## 2013-06-30 ENCOUNTER — Encounter: Payer: Self-pay | Admitting: Emergency Medicine

## 2013-06-30 ENCOUNTER — Ambulatory Visit (INDEPENDENT_AMBULATORY_CARE_PROVIDER_SITE_OTHER): Payer: BC Managed Care – PPO | Admitting: Emergency Medicine

## 2013-06-30 VITALS — BP 126/88 | HR 76 | Temp 97.8°F | Resp 16 | Ht 61.5 in | Wt 143.0 lb

## 2013-06-30 DIAGNOSIS — E119 Type 2 diabetes mellitus without complications: Secondary | ICD-10-CM

## 2013-06-30 DIAGNOSIS — I10 Essential (primary) hypertension: Secondary | ICD-10-CM

## 2013-06-30 DIAGNOSIS — E782 Mixed hyperlipidemia: Secondary | ICD-10-CM

## 2013-06-30 DIAGNOSIS — M25569 Pain in unspecified knee: Secondary | ICD-10-CM

## 2013-06-30 LAB — CBC WITH DIFFERENTIAL/PLATELET
Basophils Absolute: 0.1 10*3/uL (ref 0.0–0.1)
Basophils Relative: 1 % (ref 0–1)
EOS ABS: 0.9 10*3/uL — AB (ref 0.0–0.7)
EOS PCT: 10 % — AB (ref 0–5)
HCT: 37.5 % — ABNORMAL LOW (ref 39.0–52.0)
HEMOGLOBIN: 12.1 g/dL — AB (ref 13.0–17.0)
Lymphocytes Relative: 20 % (ref 12–46)
Lymphs Abs: 1.7 10*3/uL (ref 0.7–4.0)
MCH: 20.2 pg — AB (ref 26.0–34.0)
MCHC: 32.3 g/dL (ref 30.0–36.0)
MCV: 62.6 fL — AB (ref 78.0–100.0)
MONOS PCT: 10 % (ref 3–12)
Monocytes Absolute: 0.9 10*3/uL (ref 0.1–1.0)
Neutro Abs: 5 10*3/uL (ref 1.7–7.7)
Neutrophils Relative %: 59 % (ref 43–77)
Platelets: 336 10*3/uL (ref 150–400)
RBC: 5.99 MIL/uL — ABNORMAL HIGH (ref 4.22–5.81)
RDW: 17.1 % — ABNORMAL HIGH (ref 11.5–15.5)
WBC: 8.5 10*3/uL (ref 4.0–10.5)

## 2013-06-30 NOTE — Progress Notes (Signed)
Subjective:    Patient ID: William Haney, male    DOB: Dec 26, 1950, 63 y.o.   MRN: [id number]  HPI Comments: 63 yo male presents for 3 month F/U for HTN, Cholesterol, DM, D. Deficient. He is concerned about cost of Welchol/ Crestor. He notes BP is good at home. He notes BS good. He is eating healthy. He has started treadmill 2-3 days a week. He checks feet routinely and denies skin break down or neuropathy increase. Patient was on Invokana samples but ran out, he notes he had improved numbers with samples.   Last labs  CHOL         111   03/25/2013 HDL           40   03/25/2013 LDLCALC       51   03/25/2013 TRIG          98   03/25/2013 CHOLHDL      2.8   03/25/2013 ALT           20   03/25/2013 AST           16   03/25/2013 ALKPHOS       58   03/25/2013 BILITOT      0.5   03/25/2013 CREATININE     0.91   03/25/2013 BUN              12   03/25/2013 NA              134   03/25/2013 K               5.4   03/25/2013 CL              100   03/25/2013 CO2              27   03/25/2013 WBC      8.4   03/25/2013 HGB     11.3   03/25/2013 HCT     36.7   03/25/2013 MCV     65.1   03/25/2013 PLT      323   03/25/2013 HGBA1C      8.6   03/25/2013  He notes knee improvement with shots in both.     Medication List       This list is accurate as of: 06/30/13 11:12 AM.  Always use your most recent med list.               aspirin 81 MG tablet  Take 81 mg by mouth daily.     glimepiride 4 MG tablet  Commonly known as:  AMARYL  TAKE 1 TABLET BY MOUTH TWICE A DAY     metFORMIN 500 MG tablet  Commonly known as:  GLUCOPHAGE  TAKE 2 TABLETS BY MOUTH EVERY MORNING AND TAKE 2 TABLET EVERY EVENING     metoprolol tartrate 25 MG tablet  Commonly known as:  LOPRESSOR  TAKE ONE TABLET BY MOUTH TWICE DAILY     multivitamin tablet  Take 1 tablet by mouth daily.     rosuvastatin 20 MG tablet  Commonly known as:  CRESTOR  Take 20 mg by mouth daily.     WELCHOL 625 MG tablet  Generic drug:  colesevelam  TAKE  3 TABLETS BY MOUTH TWICE A DAY         Allergies  Allergen Reactions  . Statins Other (See Comments)    Elevated LFT's   Past Medical History  Diagnosis Date  .  Coronary artery disease     s/p CABG in 2008  . Carotid artery stenosis     60 %-80 %  . Dyslipidemia   . Anemia   . Sinus pain   . Syncope   . Diabetes mellitus type II   . Hypertension   . Hyperlipidemia   . Vitamin D deficiency      Review of Systems  All other systems reviewed and are negative.  BP 126/88  Pulse 76  Temp(Src) 97.8 F (36.6 C) (Temporal)  Resp 16  Ht 5' 1.5" (1.562 m)  Wt 143 lb (64.864 kg)  BMI 26.59 kg/m2     Objective:   Physical Exam  Nursing note and vitals reviewed. Constitutional: He is oriented to person, place, and time. He appears well-developed and well-nourished.  HENT:  Head: Normocephalic and atraumatic.  Right Ear: External ear normal.  Left Ear: External ear normal.  Nose: Nose normal.  Eyes: Conjunctivae and EOM are normal.  Neck: Normal range of motion. Neck supple. No JVD present. No thyromegaly present.  Cardiovascular: Normal rate, regular rhythm, normal heart sounds and intact distal pulses.   Pulmonary/Chest: Effort normal and breath sounds normal.  Abdominal: Soft. Bowel sounds are normal. He exhibits no distension and no mass. There is no tenderness. There is no rebound and no guarding.  Musculoskeletal: Normal range of motion. He exhibits no edema and no tenderness.  Mild limp with improvement since last OV  Lymphadenopathy:    He has no cervical adenopathy.  Neurological: He is alert and oriented to person, place, and time. He has normal reflexes. No cranial nerve deficit. Coordination normal.  Skin: Skin is warm and dry.  Dry Heels  Psychiatric: He has a normal mood and affect. His behavior is normal. Judgment and thought content normal.          Assessment & Plan:  1.  3 month F/U for HTN, Cholesterol,DM, D. Deficient. Needs healthy diet,  cardio QD and obtain healthy weight. Check Labs, Check BP if >130/80 call office, Check BS if >200 call office SX Crestor # 70  , Invokana 300 mg # 10, Welchol # 10 bottles  2. dry skin DM- Advised may need podiatry evaluation with  DM Hx, epsom salt soaks, vaseline treatment QD, monitor QD and call with any concerns/ adverse changes  3. Knee pain- increase activity with water,keep f/u ortho

## 2013-06-30 NOTE — Patient Instructions (Addendum)
Bad carbs also include fruit juice, alcohol, and sweet tea. These are empty calories that do not signal to your brain that you are full.   Please remember the good carbs are still carbs which convert into sugar. So please measure them out no more than 1/2-1 cup of rice, oatmeal, pasta, and beans.  Veggies are however free foods! Pile them on.   I like lean protein at every meal such as chicken, [place], pork chops, cottage cheese, etc. Just do not fry these meats and please center your meal around vegetable, the meats should be a side dish.   No all fruit is created equal. Please see the list below, the fruit at the bottom is higher in sugars than the fruit at the top   We want weight loss that will last so you should lose 1-2 pounds a week.  THAT IS IT! Please pick THREE things a month to change. Once it is a habit check off the item. Then pick another three items off the list to become habits.  If you are already doing a habit on the list GREAT!  Cross that item off! o Don't drink your calories. Ie, alcohol, soda, fruit juice, and sweet tea.  o Drink more water. Drink a glass when you feel hungry or before each meal.  o Eat breakfast - Complex carb and protein (likeDannon light and fit yogurt, oatmeal, fruit, eggs, [place] bacon). o Measure your cereal.  Eat no more than one cup a day. (ie [place]) o Eat an apple a day. o Add a vegetable a day. o Try a new vegetable a month. o Use Pam! Stop using oil or butter to cook. o Don't finish your plate or use smaller plates. o Share your dessert. o Eat sugar free Jello for dessert or frozen grapes. o Don't eat 2-3 hours before bed. o Switch to whole wheat bread, pasta, and brown rice. o Make healthier choices when you eat out. No fries! o Pick baked chicken, NOT fried. o Don't forget to SLOW DOWN when you eat. It is not going anywhere.  o Take the stairs. o Park far away in the parking lot o [employer] (or weights) for 10 minutes while  watching TV. o Walk at work for 10 minutes during break. o Walk outside 1 time a week with your friend, kids, dog, or significant other. o Start a walking group at [place] the mall as much as you can tolerate.  o Keep a food diary. o Weigh yourself daily. o Walk for 15 minutes 3 days per week. o Cook at home more often and eat out less.  If life happens and you go back to old habits, it is okay.  Just start over. You can do it!   If you experience chest pain, get short of breath, or tired during the exercise, please stop immediately and inform your doctor.  Diabetes and Foot Care Diabetes may cause you to have problems because of poor blood supply (circulation) to your feet and legs. This may cause the skin on your feet to become thinner, break easier, and heal more slowly. Your skin may become dry, and the skin may peel and crack. You may also have nerve damage in your legs and feet causing decreased feeling in them. You may not notice minor injuries to your feet that could lead to infections or more serious problems. Taking care of your feet is one of the most important things you can  do for yourself.  HOME CARE INSTRUCTIONS  Wear shoes at all times, even in the house. Do not go barefoot. Bare feet are easily injured.  Check your feet daily for blisters, cuts, and redness. If you cannot see the bottom of your feet, use a mirror or ask someone for help.  Wash your feet with warm water (do not use hot water) and mild soap. Then pat your feet and the areas between your toes until they are completely dry. Do not soak your feet as this can dry your skin.  Apply a moisturizing lotion or petroleum jelly (that does not contain alcohol and is unscented) to the skin on your feet and to dry, brittle toenails. Do not apply lotion between your toes.  Trim your toenails straight across. Do not dig under them or around the cuticle. File the edges of your nails with an emery board or nail file.  Do  not cut corns or calluses or try to remove them with medicine.  Wear clean socks or stockings every day. Make sure they are not too tight. Do not wear knee-high stockings since they may decrease blood flow to your legs.  Wear shoes that fit properly and have enough cushioning. To break in new shoes, wear them for just a few hours a day. This prevents you from injuring your feet. Always look in your shoes before you put them on to be sure there are no objects inside.  Do not cross your legs. This may decrease the blood flow to your feet.  If you find a minor scrape, cut, or break in the skin on your feet, keep it and the skin around it clean and dry. These areas may be cleansed with mild soap and water. Do not cleanse the area with peroxide, alcohol, or iodine.  When you remove an adhesive bandage, be sure not to damage the skin around it.  If you have a wound, look at it several times a day to make sure it is healing.  Do not use heating pads or hot water bottles. They may burn your skin. If you have lost feeling in your feet or legs, you may not know it is happening until it is too late.  Make sure your health care provider performs a complete foot exam at least annually or more often if you have foot problems. Report any cuts, sores, or bruises to your health care provider immediately. SEEK MEDICAL CARE IF:   You have an injury that is not healing.  You have cuts or breaks in the skin.  You have an ingrown nail.  You notice redness on your legs or feet.  You feel burning or tingling in your legs or feet.  You have pain or cramps in your legs and feet.  Your legs or feet are numb.  Your feet always feel cold. SEEK IMMEDIATE MEDICAL CARE IF:   There is increasing redness, swelling, or pain in or around a wound.  There is a red line that goes up your leg.  Pus is coming from a wound.  You develop a fever or as directed by your health care provider.  You notice a bad smell  coming from an ulcer or wound. Document Released: 02/23/2000 Document Revised: 10/28/2012 Document Reviewed: 08/04/2012 [facility] Patient Information 2014 [place].

## 2013-07-01 LAB — LIPID PANEL
CHOL/HDL RATIO: 2.5 ratio
Cholesterol: 119 mg/dL (ref 0–200)
HDL: 48 mg/dL (ref >39)
LDL Cholesterol: 51 mg/dL (ref 0–99)
TRIGLYCERIDES: 99 mg/dL (ref <150)
VLDL: 20 mg/dL (ref 0–40)

## 2013-07-01 LAB — INSULIN, FASTING: Insulin fasting, serum: 4 u[IU]/mL (ref 3–28)

## 2013-07-01 LAB — HEPATIC FUNCTION PANEL
ALBUMIN: 4.3 g/dL (ref 3.5–5.2)
ALT: 24 U/L (ref 0–53)
AST: 20 U/L (ref 0–37)
Alkaline Phosphatase: 55 U/L (ref 39–117)
BILIRUBIN TOTAL: 0.6 mg/dL (ref 0.2–1.2)
Bilirubin, Direct: 0.2 mg/dL (ref 0.0–0.3)
Indirect Bilirubin: 0.4 mg/dL (ref 0.2–1.2)
Total Protein: 6.9 g/dL (ref 6.0–8.3)

## 2013-07-01 LAB — HEMOGLOBIN A1C
Hgb A1c MFr Bld: 8.6 % — ABNORMAL HIGH (ref <5.7)
Mean Plasma Glucose: 200 mg/dL — ABNORMAL HIGH (ref <117)

## 2013-07-01 LAB — BASIC METABOLIC PANEL WITH GFR
BUN: 8 mg/dL (ref 6–23)
CALCIUM: 9.6 mg/dL (ref 8.4–10.5)
CO2: 25 meq/L (ref 19–32)
Chloride: 98 mEq/L (ref 96–112)
Creat: 0.85 mg/dL (ref 0.50–1.35)
GFR, Est Non African American: >89 mL/min
GLUCOSE: 158 mg/dL — AB (ref 70–99)
Potassium: 5.6 mEq/L — ABNORMAL HIGH (ref 3.5–5.3)
Sodium: 131 mEq/L — ABNORMAL LOW (ref 135–145)

## 2013-07-12 ENCOUNTER — Other Ambulatory Visit: Payer: Self-pay

## 2013-07-14 ENCOUNTER — Other Ambulatory Visit: Payer: Self-pay

## 2013-07-14 MED ORDER — METOPROLOL TARTRATE 25 MG PO TABS: ORAL_TABLET | ORAL | Status: DC

## 2013-08-12 ENCOUNTER — Telehealth: Payer: Self-pay | Admitting: Cardiology

## 2013-08-12 MED ORDER — METOPROLOL TARTRATE 25 MG PO TABS: ORAL_TABLET | ORAL | Status: DC

## 2013-08-12 NOTE — Telephone Encounter (Signed)
New message     Refill metoprolol tartrate to [facility] [address]

## 2013-08-19 ENCOUNTER — Encounter: Payer: Self-pay | Admitting: Cardiology

## 2013-08-19 ENCOUNTER — Ambulatory Visit (INDEPENDENT_AMBULATORY_CARE_PROVIDER_SITE_OTHER): Payer: BC Managed Care – PPO | Admitting: Cardiology

## 2013-08-19 VITALS — BP 142/94 | HR 79 | Ht 61.5 in | Wt 144.8 lb

## 2013-08-19 DIAGNOSIS — I2510 Atherosclerotic heart disease of native coronary artery without angina pectoris: Secondary | ICD-10-CM

## 2013-08-19 DIAGNOSIS — I6529 Occlusion and stenosis of unspecified carotid artery: Secondary | ICD-10-CM

## 2013-08-19 MED ORDER — METOPROLOL TARTRATE 25 MG PO TABS: ORAL_TABLET | ORAL | Status: DC

## 2013-08-19 NOTE — Progress Notes (Signed)
HPI The patient presents for followup of his known coronary disease status post CABG. Since I last saw him he has done well. He denies any chest pressure, neck or arm discomfort. He is walking daily. [name] He denies any new shortness of breath, PND or orthopnea. He has no new palpitations, presyncope or syncope. He's had no weight gain or edema.   Allergies  Allergen Reactions  . Statins Other (See Comments)    Elevated LFT's    Current Outpatient Prescriptions  Medication Sig Dispense Refill  . aspirin 81 MG tablet Take 81 mg by mouth daily.      [name] glimepiride (AMARYL) 4 MG tablet TAKE 1 TABLET BY MOUTH TWICE A DAY  60 tablet  5  . metFORMIN (GLUCOPHAGE) 500 MG tablet TAKE 2 TABLETS BY MOUTH EVERY MORNING AND TAKE 2 TABLET EVERY EVENING  120 tablet  0  . metoprolol tartrate (LOPRESSOR) 25 MG tablet TAKE ONE TABLET BY MOUTH TWICE DAILY  30 tablet  0  . Multiple Vitamin (MULTIVITAMIN) tablet Take 1 tablet by mouth daily.      . rosuvastatin (CRESTOR) 20 MG tablet Take 20 mg by mouth daily.      Earnestine Mealing 625 MG tablet TAKE 3 TABLETS BY MOUTH TWICE A DAY  180 tablet  5   No current facility-administered medications for this visit.    Past Medical History  Diagnosis Date  . Coronary artery disease     s/p CABG in 2008  . Carotid artery stenosis     60 %-80 %  . Dyslipidemia   . Anemia   . Sinus pain   . Syncope   . Diabetes mellitus type II   . Hypertension   . Hyperlipidemia   . Vitamin D deficiency     Past Surgical History  Procedure Laterality Date  . Coronary artery bypass graft  2008    ROS:  As stated in the HPI and negative for all other systems.  PHYSICAL EXAM BP 142/94  Pulse 79  Ht 5' 1.5" (1.562 m)  Wt 144 lb 12.8 oz (65.681 kg)  BMI 26.92 kg/m2 GENERAL:  Well appearing HEENT:  Pupils equal round and reactive, fundi not visualized, oral mucosa unremarkable NECK:  No jugular venous distention, waveform within normal limits, carotid upstroke brisk and  symmetric, no bruits, no thyromegaly LYMPHATICS:  No cervical, inguinal adenopathy LUNGS:  Clear to auscultation bilaterally BACK:  No CVA tenderness CHEST:  Well healed sternotomy scar. HEART:  PMI not displaced or sustained,S1 and S2 within normal limits, no S3, no S4, no clicks, no rubs, no murmurs ABD:  Flat, positive bowel sounds normal in frequency in pitch, no bruits, no rebound, no guarding, no midline pulsatile mass, no hepatomegaly, no splenomegaly EXT:  2 plus pulses throughout, no edema, no cyanosis no clubbing  EKG: Sinus rhythm, rate 79, axis within normal limits, intervals within normal limits, old inferior posterior infarct, inferolateral T wave inversions unchanged from previous, PAC.  08/19/2013  ASSESSMENT AND PLAN  CAD:  The patient has no new sypmtoms.  No further cardiovascular testing is indicated.  We will continue with aggressive risk reduction and meds as listed.  HTN:  BP is slightly elevated today. However, this is unusual.  He'll continue the meds as listed.  DYSLIPIDEMIA:  His lipid profile done recently was excellent. He will remain on the meds as listed.  DM:  His A1C was 8.6. He was recently started on Invokana.   I will defer to  MCKEOWN,WILLIAM DAVID, MD  CAROTID STENOSIS:  He is overdue for follow up and we will arrange this.

## 2013-08-19 NOTE — Patient Instructions (Addendum)
Your physician recommends that you schedule a follow-up appointment in: 18 months. No changes were made today in your therapy.  Your physician has requested that you have a carotid duplex. This test is an ultrasound of the carotid arteries in your neck. It looks at blood flow through these arteries that supply the brain with blood. Allow one hour for this exam. There are no restrictions or special instructions.

## 2013-08-24 ENCOUNTER — Inpatient Hospital Stay (HOSPITAL_COMMUNITY): Admission: RE | Admit: 2013-08-24 | Payer: BC Managed Care – PPO | Source: Ambulatory Visit

## 2013-08-25 ENCOUNTER — Other Ambulatory Visit: Payer: Self-pay | Admitting: Cardiology

## 2013-08-26 ENCOUNTER — Other Ambulatory Visit (HOSPITAL_COMMUNITY): Payer: Self-pay | Admitting: Cardiology

## 2013-08-26 DIAGNOSIS — I6529 Occlusion and stenosis of unspecified carotid artery: Secondary | ICD-10-CM

## 2013-08-31 ENCOUNTER — Other Ambulatory Visit: Payer: Self-pay | Admitting: Emergency Medicine

## 2013-08-31 MED ORDER — CANAGLIFLOZIN 300 MG PO TABS: ORAL_TABLET | ORAL | Status: DC

## 2013-09-01 ENCOUNTER — Ambulatory Visit (HOSPITAL_COMMUNITY)
Admission: RE | Admit: 2013-09-01 | Discharge: 2013-09-01 | Disposition: A | Discharge status: Home / Self Care | Payer: BC Managed Care – PPO | Source: Ambulatory Visit | Attending: Cardiology | Admitting: Cardiology

## 2013-09-01 DIAGNOSIS — I63239 Cerebral infarction due to unspecified occlusion or stenosis of unspecified carotid arteries: Secondary | ICD-10-CM | POA: Insufficient documentation

## 2013-09-01 DIAGNOSIS — I6529 Occlusion and stenosis of unspecified carotid artery: Secondary | ICD-10-CM

## 2013-09-01 NOTE — Progress Notes (Signed)
Carotid Duplex Completed. °Brianna L Mazza,RVT °

## 2013-10-10 ENCOUNTER — Other Ambulatory Visit: Payer: Self-pay | Admitting: Emergency Medicine

## 2013-10-11 ENCOUNTER — Encounter: Payer: Self-pay | Admitting: Internal Medicine

## 2013-10-11 ENCOUNTER — Ambulatory Visit (INDEPENDENT_AMBULATORY_CARE_PROVIDER_SITE_OTHER): Payer: BC Managed Care – PPO | Admitting: Internal Medicine

## 2013-10-11 VITALS — BP 136/82 | HR 64 | Temp 97.4°F | Resp 16 | Ht 61.5 in | Wt 146.0 lb

## 2013-10-11 DIAGNOSIS — E782 Mixed hyperlipidemia: Secondary | ICD-10-CM

## 2013-10-11 DIAGNOSIS — E119 Type 2 diabetes mellitus without complications: Secondary | ICD-10-CM

## 2013-10-11 DIAGNOSIS — Z79899 Other long term (current) drug therapy: Secondary | ICD-10-CM | POA: Insufficient documentation

## 2013-10-11 DIAGNOSIS — E559 Vitamin D deficiency, unspecified: Secondary | ICD-10-CM

## 2013-10-11 DIAGNOSIS — I10 Essential (primary) hypertension: Secondary | ICD-10-CM

## 2013-10-11 LAB — LIPID PANEL
CHOL/HDL RATIO: 2.9 ratio
Cholesterol: 115 mg/dL (ref 0–200)
HDL: 40 mg/dL (ref >39)
LDL CALC: 54 mg/dL (ref 0–99)
TRIGLYCERIDES: 107 mg/dL (ref <150)
VLDL: 21 mg/dL (ref 0–40)

## 2013-10-11 LAB — HEPATIC FUNCTION PANEL
ALT: 19 U/L (ref 0–53)
AST: 16 U/L (ref 0–37)
Albumin: 4 g/dL (ref 3.5–5.2)
Alkaline Phosphatase: 55 U/L (ref 39–117)
BILIRUBIN INDIRECT: 0.5 mg/dL (ref 0.2–1.2)
BILIRUBIN TOTAL: 0.6 mg/dL (ref 0.2–1.2)
Bilirubin, Direct: 0.1 mg/dL (ref 0.0–0.3)
TOTAL PROTEIN: 6.3 g/dL (ref 6.0–8.3)

## 2013-10-11 LAB — CBC WITH DIFFERENTIAL/PLATELET
BASOS ABS: 0.1 10*3/uL (ref 0.0–0.1)
Basophils Relative: 1 % (ref 0–1)
Eosinophils Absolute: 0.8 10*3/uL — ABNORMAL HIGH (ref 0.0–0.7)
Eosinophils Relative: 10 % — ABNORMAL HIGH (ref 0–5)
HCT: 38.3 % — ABNORMAL LOW (ref 39.0–52.0)
HEMOGLOBIN: 11.9 g/dL — AB (ref 13.0–17.0)
LYMPHS PCT: 23 % (ref 12–46)
Lymphs Abs: 1.9 10*3/uL (ref 0.7–4.0)
MCH: 20.1 pg — ABNORMAL LOW (ref 26.0–34.0)
MCHC: 31.1 g/dL (ref 30.0–36.0)
MCV: 64.6 fL — ABNORMAL LOW (ref 78.0–100.0)
MONO ABS: 0.8 10*3/uL (ref 0.1–1.0)
Monocytes Relative: 10 % (ref 3–12)
NEUTROS ABS: 4.5 10*3/uL (ref 1.7–7.7)
Neutrophils Relative %: 56 % (ref 43–77)
Platelets: 294 10*3/uL (ref 150–400)
RBC: 5.93 MIL/uL — ABNORMAL HIGH (ref 4.22–5.81)
RDW: 18 % — AB (ref 11.5–15.5)
WBC: 8.1 10*3/uL (ref 4.0–10.5)

## 2013-10-11 LAB — BASIC METABOLIC PANEL WITH GFR
BUN: 14 mg/dL (ref 6–23)
CHLORIDE: 101 meq/L (ref 96–112)
CO2: 24 mEq/L (ref 19–32)
Calcium: 9 mg/dL (ref 8.4–10.5)
Creat: 0.9 mg/dL (ref 0.50–1.35)
GFR, Est Non African American: 89 mL/min
Glucose, Bld: 148 mg/dL — ABNORMAL HIGH (ref 70–99)
Potassium: 5.7 mEq/L — ABNORMAL HIGH (ref 3.5–5.3)
SODIUM: 134 meq/L — AB (ref 135–145)

## 2013-10-11 LAB — HEMOGLOBIN A1C
Hgb A1c MFr Bld: 8.1 % — ABNORMAL HIGH (ref <5.7)
Mean Plasma Glucose: 186 mg/dL — ABNORMAL HIGH (ref <117)

## 2013-10-11 LAB — MAGNESIUM: Magnesium: 2 mg/dL (ref 1.5–2.5)

## 2013-10-11 LAB — TSH: TSH: 2.192 u[IU]/mL (ref 0.350–4.500)

## 2013-10-11 NOTE — Progress Notes (Signed)
Patient ID: William Haney, male   DOB: [birth year], 63 y.o.   MRN: [id number]   This very nice 63 y.o.MWF presents for 3 month follow up with Hypertension, Hyperlipidemia, T2_NIDDM and Vitamin D Deficiency.    Patient is treated for HTN & BP has been controlled at home. Today's BP: 136/82 mmHg. In 2008 patient was Dx'd with HTN and also had a CABG that year. Patient is followed by Dr Percival Spanish. Patient denies any cardiac type chest pain, palpitations, dyspnea/orthopnea/PND, dizziness, claudication, or dependent edema.   Hyperlipidemia is controlled with diet & meds. Patient denies myalgias or other med SE's. Last Lipids were at goal - 06/30/2013: Cholesterol, Total 119; HDL Cholesterol by NMR 48; LDL (calc) 51; Triglycerides 99   Also, the patient has history of T2_NIDDM predating since 2000 and patient reports episodes of reactive hypoglycemia and therefore self d/c'd his Invokana. He denies any symptoms of  diabetic polys, paresthesias or visual blurring.  Last A1c was 06/30/2013: Hemoglobin-A1c 8.6*    Further, Patient has history of Vitamin D Deficiency and patient supplements vitamin D without any suspected side-effects. Last vitamin D was severely low at 30 in Oct 2014.    Medication List   aspirin 81 MG tablet  Take 81 mg by mouth daily.     Canagliflozin 300 MG Tabs  Commonly known as:  INVOKANA  1poQD AD     glimepiride 4 MG tablet  Commonly known as:  AMARYL  TAKE 1 TABLET BY MOUTH TWICE A DAY     meloxicam 15 MG tablet  Commonly known as:  MOBIC     metFORMIN 500 MG tablet  Commonly known as:  GLUCOPHAGE  TAKE 2 TABLETS BY MOUTH EVERY MORNING AND TAKE 2 TABLET EVERY EVENING     metoprolol tartrate 25 MG tablet  Commonly known as:  LOPRESSOR  1 tablet twice daily     multivitamin tablet  Take 1 tablet by mouth daily.     rosuvastatin 20 MG tablet  Commonly known as:  CRESTOR  Take 20 mg by mouth daily.     WELCHOL 625 MG tablet  Generic drug:  colesevelam  TAKE 3  TABLETS BY MOUTH TWICE A DAY     Allergies  Allergen Reactions  . Statins Other (See Comments)    Elevated LFT's   PMHx:   Past Medical History  Diagnosis Date  . Coronary artery disease     s/p CABG in 2008  . Carotid artery stenosis     60 %-80 %  . Dyslipidemia   . Anemia   . Sinus pain   . Syncope   . Diabetes mellitus type II   . Hypertension   . Hyperlipidemia   . Vitamin D deficiency    FHx:    Reviewed / unchanged SHx:    Reviewed / unchanged  Systems Review:  Constitutional: Denies fever, chills, wt changes, headaches, insomnia, fatigue, night sweats, change in appetite. Eyes: Denies redness, blurred vision, diplopia, discharge, itchy, watery eyes.  ENT: Denies discharge, congestion, post nasal drip, epistaxis, sore throat, earache, hearing loss, dental pain, tinnitus, vertigo, sinus pain, snoring.  CV: Denies chest pain, palpitations, irregular heartbeat, syncope, dyspnea, diaphoresis, orthopnea, PND, claudication or edema. Respiratory: denies cough, dyspnea, DOE, pleurisy, hoarseness, laryngitis, wheezing.  Gastrointestinal: Denies dysphagia, odynophagia, heartburn, reflux, water brash, abdominal pain or cramps, nausea, vomiting, bloating, diarrhea, constipation, hematemesis, melena, hematochezia  or hemorrhoids. Genitourinary: Denies dysuria, frequency, urgency, nocturia, hesitancy, discharge, hematuria or flank pain. Musculoskeletal: Denies arthralgias, myalgias,  stiffness, jt. swelling, pain, limping or strain/sprain.  Skin: Denies pruritus, rash, hives, warts, acne, eczema or change in skin lesion(s). Neuro: No weakness, tremor, incoordination, spasms, paresthesia or pain. Psychiatric: Denies confusion, memory loss or sensory loss. Endo: Denies change in weight, skin or hair change.  Heme/Lymph: No excessive bleeding, bruising or enlarged lymph nodes.  Exam:  BP 136/82  Pulse 64  Temp(Src) 97.4 F (36.3 C) (Temporal)  Resp 16  Ht 5' 1.5" (1.562 m)  Wt  146 lb (66.225 kg)  BMI 27.14 kg/m2  Appears well nourished and in no distress. Eyes: PERRLA, EOMs, conjunctiva no swelling or erythema. Sinuses: No frontal/maxillary tenderness ENT/Mouth: EAC's clear, TM's nl w/o erythema, bulging. Nares clear w/o erythema, swelling, exudates. Oropharynx clear without erythema or exudates. Oral hygiene is good. Tongue normal, non obstructing. Hearing intact.  Neck: Supple. Thyroid nl. Car 2+/2+ without bruits, nodes or JVD. Chest: Respirations nl with BS clear & equal w/o rales, rhonchi, wheezing or stridor.  Cor: Heart sounds normal w/ regular rate and rhythm without sig. murmurs, gallops, clicks, or rubs. Peripheral pulses normal and equal  without edema.  Abdomen: Soft & bowel sounds normal. Non-tender w/o guarding, rebound, hernias, masses, or organomegaly.  Lymphatics: Unremarkable.  Musculoskeletal: Full ROM all peripheral extremities, joint stability, 5/5 strength, and normal gait.  Skin: Warm, dry without exposed rashes, lesions or ecchymosis apparent.  Neuro: Cranial nerves intact, reflexes equal bilaterally. Sensory-motor testing grossly intact. Tendon reflexes grossly intact.  Pysch: Alert & oriented x 3. Insight and judgement nl & appropriate. No ideations.  Assessment and Plan:  1. Hypertension - Continue monitor blood pressure at home. Continue diet/meds same.  2. Hyperlipidemia - Continue diet/meds, exercise,& lifestyle modific  3. T2_NIDDM -  D/C Glimipiride and re-start Invokana 300 mg x 1/2 tab qd  4. Vitamin D Deficiency - Continue supplementation.  5. ASCAD s/p CABG  Recommended regular exercise, BP monitoring, weight control, and discussed med and SE's. Recommended labs to assess and monitor clinical status. Further disposition pending results of labs.

## 2013-10-11 NOTE — Patient Instructions (Addendum)
Stop Amaryl / Glimipiride  Take Invokana 300 mg x 1/2 tab = 150 mg daily +++++++++++++++++++++++++++++++++++  Recommend the book "The END of DIETING" by Dr Baker Janus   and the book "The END of DIABETES " by Dr Excell Seltzer  At [email or web address] - get book & Audio CD's      Being diabetic has a  300% increased risk for heart attack, stroke, cancer, and alzheimer- type vascular dementia. It is very important that you work harder with diet by avoiding all foods that are white except chicken & fish. Avoid white rice (brown & wild rice is OK), white potatoes (sweetpotatoes in moderation is OK), White bread or wheat bread or anything made out of white flour like bagels, donuts, rolls, buns, biscuits, cakes, pastries, cookies, pizza crust, and pasta (made from white flour & egg whites) - vegetarian pasta or spinach or wheat pasta is OK. Multigrain breads like Arnold's or [employer], or multigrain sandwich thins or flatbreads.  Diet, exercise and weight loss can reverse and cure diabetes in the early stages.  Diet, exercise and weight loss is very important in the control and prevention of complications of diabetes which affects every system in your body, ie. Brain - dementia/stroke, eyes - glaucoma/blindness, heart - heart attack/heart failure, kidneys - dialysis, stomach - gastric paralysis, intestines - malabsorption, nerves - severe painful neuritis, circulation - gangrene & loss of a leg(s), and finally cancer and Alzheimers.    I recommend avoid fried & greasy foods,  sweets/candy, white rice (brown or wild rice or Quinoa is OK), white potatoes (sweet potatoes are OK) - anything made from white flour - bagels, doughnuts, rolls, buns, biscuits,white and wheat breads, pizza crust and traditional pasta made of white flour & egg white(vegetarian pasta or spinach or wheat pasta is OK).  Multi-grain bread is OK - like multi-grain flat bread or sandwich thins. Avoid alcohol in excess. Exercise is also  important.    Eat all the vegetables you want - avoid meat, especially red meat and dairy - especially cheese.  Cheese is the most concentrated form of trans-fats which is the worst thing to clog up our arteries. Veggie cheese is OK which can be found in the fresh produce section at [employer] or [employer] or [employer]    Low Blood Sugar Low blood sugar (hypoglycemia) means that the level of sugar in your blood is lower than it should be. Signs of low blood sugar include:  Getting sweaty.  Feeling hungry.  Feeling dizzy or weak.  Feeling sleepier than normal.  Feeling nervous.  Headaches.  Having a fast heartbeat. Low blood sugar can happen fast and can be an emergency. Your doctor can do tests to check your blood sugar level. You can have low blood sugar and not have diabetes. HOME CARE  Check your blood sugar as told by your doctor. If it is less than 70 mg/dl or as told by your doctor, take 1 of the following:  3 to 4 glucose tablets.   cup clear juice.   cup soda pop, not diet.  1 cup milk.  5 to 6 hard candies.  Recheck blood sugar after 15 minutes. Repeat until it is at the right level.  Eat a snack if it is more than 1 hour until the next meal.  Only take medicine as told by your doctor.  Do not skip meals. Eat on time.  Do not drink alcohol except with meals.  Check your blood glucose before  driving.  Check your blood glucose before and after exercise.  Always carry treatment with you, such as glucose pills.  Always wear a medical alert bracelet if you have diabetes. GET HELP RIGHT AWAY IF:   Your blood glucose goes below 70 mg/dl or as told by your doctor, and you:  Are confused.  Are not able to swallow.  Pass out (faint).  You cannot treat yourself. You may need someone to help you.  You have low blood sugar problems often.  You have problems from your medicines.  You are not feeling better after 3 to 4 days.  You have vision  changes. MAKE SURE YOU:   Understand these instructions.  Will watch this condition.  Will get help right away if you are not doing well or get worse. Document Released: 05/22/2009 Document Revised: 05/20/2011 Document Reviewed: 05/22/2009 [facility] Patient Information 2015 [place], [place]. This information is not intended to replace advice given to you by your health care provider. Make sure you discuss any questions you have with your health care provider.

## 2013-10-12 LAB — VITAMIN D 25 HYDROXY (VIT D DEFICIENCY, FRACTURES): Vit D, 25-Hydroxy: 32 ng/mL (ref 30–89)

## 2013-10-12 LAB — INSULIN, FASTING: Insulin fasting, serum: 7 u[IU]/mL (ref 3–28)

## 2013-11-10 ENCOUNTER — Other Ambulatory Visit: Payer: Self-pay | Admitting: Emergency Medicine

## 2013-12-29 ENCOUNTER — Other Ambulatory Visit: Payer: Self-pay | Admitting: *Deleted

## 2013-12-29 MED ORDER — ROSUVASTATIN CALCIUM 20 MG PO TABS: 20.0000 mg | ORAL_TABLET | Freq: Every day | ORAL | Status: DC

## 2013-12-30 ENCOUNTER — Encounter: Payer: Self-pay | Admitting: Emergency Medicine

## 2014-02-02 ENCOUNTER — Ambulatory Visit (INDEPENDENT_AMBULATORY_CARE_PROVIDER_SITE_OTHER): Payer: BC Managed Care – PPO | Admitting: Physician Assistant

## 2014-02-02 ENCOUNTER — Encounter: Payer: Self-pay | Admitting: Physician Assistant

## 2014-02-02 VITALS — BP 130/80 | HR 76 | Temp 98.2°F | Resp 16 | Ht 61.5 in | Wt 148.0 lb

## 2014-02-02 DIAGNOSIS — I2510 Atherosclerotic heart disease of native coronary artery without angina pectoris: Secondary | ICD-10-CM

## 2014-02-02 DIAGNOSIS — I10 Essential (primary) hypertension: Secondary | ICD-10-CM

## 2014-02-02 DIAGNOSIS — K120 Recurrent oral aphthae: Secondary | ICD-10-CM

## 2014-02-02 DIAGNOSIS — Z23 Encounter for immunization: Secondary | ICD-10-CM

## 2014-02-02 DIAGNOSIS — E782 Mixed hyperlipidemia: Secondary | ICD-10-CM

## 2014-02-02 DIAGNOSIS — E559 Vitamin D deficiency, unspecified: Secondary | ICD-10-CM

## 2014-02-02 DIAGNOSIS — R6889 Other general symptoms and signs: Secondary | ICD-10-CM

## 2014-02-02 DIAGNOSIS — E118 Type 2 diabetes mellitus with unspecified complications: Secondary | ICD-10-CM

## 2014-02-02 DIAGNOSIS — Z79899 Other long term (current) drug therapy: Secondary | ICD-10-CM

## 2014-02-02 DIAGNOSIS — Z0001 Encounter for general adult medical examination with abnormal findings: Secondary | ICD-10-CM

## 2014-02-02 LAB — CBC WITH DIFFERENTIAL/PLATELET
Basophils Absolute: 0.1 10*3/uL (ref 0.0–0.1)
Basophils Relative: 1 % (ref 0–1)
EOS PCT: 11 % — AB (ref 0–5)
Eosinophils Absolute: 0.9 10*3/uL — ABNORMAL HIGH (ref 0.0–0.7)
HCT: 39.8 % (ref 39.0–52.0)
HEMOGLOBIN: 12.2 g/dL — AB (ref 13.0–17.0)
LYMPHS ABS: 2 10*3/uL (ref 0.7–4.0)
LYMPHS PCT: 24 % (ref 12–46)
MCH: 19.9 pg — ABNORMAL LOW (ref 26.0–34.0)
MCHC: 30.7 g/dL (ref 30.0–36.0)
MCV: 65 fL — AB (ref 78.0–100.0)
MONOS PCT: 10 % (ref 3–12)
Monocytes Absolute: 0.8 10*3/uL (ref 0.1–1.0)
Neutro Abs: 4.4 10*3/uL (ref 1.7–7.7)
Neutrophils Relative %: 54 % (ref 43–77)
PLATELETS: 342 10*3/uL (ref 150–400)
RBC: 6.12 MIL/uL — AB (ref 4.22–5.81)
RDW: 17.8 % — ABNORMAL HIGH (ref 11.5–15.5)
WBC: 8.2 10*3/uL (ref 4.0–10.5)

## 2014-02-02 MED ORDER — METFORMIN HCL ER 500 MG PO TB24: ORAL_TABLET | ORAL | Status: DC

## 2014-02-02 MED ORDER — METFORMIN HCL 500 MG PO TABS: ORAL_TABLET | ORAL | Status: DC

## 2014-02-02 NOTE — Progress Notes (Signed)
Complete Physical  Assessment and Plan: 1. Atherosclerosis of native coronary artery of native heart without angina pectoris Control blood pressure, cholesterol, glucose, increase exercise.   2. Essential hypertension Discussed adding ACE/ARB but patient wants to wait until after the holidays  3. Hyperlipidemia -continue medications, check lipids, decrease fatty foods, increase activity.  -Questionable myalgias versus OA- check CPK  4. Vitamin D deficiency Check vitamin D  5. Aphthous ulcer of mouth Check labs, B12, iron, do mouth wash with 1 capful of peroxide and 1 capful of white vinegra twice a day, if not better call the office.   6. Encounter for general adult medical examination with abnormal findings - CBC with Differential - BASIC METABOLIC PANEL WITH GFR - Hepatic function panel - Lipid panel - TSH - Hemoglobin A1c - Insulin, fasting - Vit D  25 hydroxy (rtn osteoporosis monitoring) - Urinalysis, Routine w reflex microscopic - Microalbumin / creatinine urine ratio - Vitamin B12 - Magnesium - Iron and TIBC - Ferritin - Uric acid - CK, total  7. Type 2 diabetes mellitus with complication Discussed general issues about diabetes pathophysiology and management., Educational material distributed., Suggested low cholesterol diet., Encouraged aerobic exercise., Discussed foot care., Reminded to get yearly retinal exam. Feels that he does not do well with invokana, has decreased his glyburide to 1/2 BID, long discussion about uncontrolled DM, his CABG and his kidney function, if still very elevated need to try another medication like Januvia/Onglyza.  - [facility] DIABETES FOOT EXAM  8. Need for prophylactic vaccination against Streptococcus pneumoniae (pneumococcus) - Pneumococcal conjugate vaccine 13-valent IM   Discussed med's effects and SE's. Screening labs and tests as requested with regular follow-up as recommended.  HPI Patient presents for a complete physical.    His blood pressure has been controlled at home, today their BP is BP: 130/80 mmHg He does workout, walks. He denies chest pain, shortness of breath, dizziness.  He is on cholesterol medication, crestor and has some myaglias. His cholesterol is at goal. The cholesterol last visit was:   Lab Results  Component Value Date   CHOL 115 10/11/2013   HDL 40 10/11/2013   LDLCALC 54 10/11/2013   TRIG 107 10/11/2013   CHOLHDL 2.9 10/11/2013   He has had diabetes since 2000.  He has been working on diet and exercise for diabetes with CAD, he is on bASA, MF and amaryl, took himself off invokana, and denies paresthesia of the feet, polydipsia and polyuria. Last A1C in the office was:  Lab Results  Component Value Date   HGBA1C 8.1* 10/11/2013   Patient is on Vitamin D supplement.   Lab Results  Component Value Date   VD25OH 32 10/11/2013     He has been having bilateral knee pain, has been having steroid injections with Dr. Berenice Primas that have helped and has been able to walk, but he states he wants the surgery for next year.  He has seen Dr. Percival Spanish and doing well, he had a CABG in 2008. He is on bASA, he is on lopressor but not on ACE/ARB Complains of sores in his mouth for the last 6 months.   Current Medications:  Current Outpatient Prescriptions on File Prior to Visit  Medication Sig Dispense Refill  . aspirin 81 MG tablet Take 81 mg by mouth daily.    . Canagliflozin (INVOKANA) 300 MG TABS 1poQD AD 42 tablet 0  . glimepiride (AMARYL) 4 MG tablet TAKE 1 TABLET BY MOUTH TWICE A DAY 60 tablet 5  .  meloxicam (MOBIC) 15 MG tablet     . metoprolol tartrate (LOPRESSOR) 25 MG tablet 1 tablet twice daily 180 tablet 3  . Multiple Vitamin (MULTIVITAMIN) tablet Take 1 tablet by mouth daily.    . rosuvastatin (CRESTOR) 20 MG tablet Take 1 tablet (20 mg total) by mouth daily. 30 tablet 11  . WELCHOL 625 MG tablet TAKE 3 TABLETS BY MOUTH TWICE A DAY 180 tablet 5   No current facility-administered  medications on file prior to visit.   Health Maintenance:  Immunization History  Administered Date(s) Administered  . Influenza Whole 12/10/2012  . Influenza-Unspecified 12/31/2013  . Pneumococcal Polysaccharide-23 04/13/2003  . Td 04/18/2004    Tetanus:2006 Pneumovax: 2005 Prevnar 13: DUE Flu vaccine:2015 Zostavax: declines DEXA: Colonoscopy: 2014 due 2024 EGD: Dr. Clydene Laming eye doctor - jan 2015 Dr. Percival Spanish Dr. Watt Climes  Allergies:  Allergies  Allergen Reactions  . Statins Other (See Comments)    Elevated LFT's   Medical History:  Past Medical History  Diagnosis Date  . Coronary artery disease     s/p CABG in 2008  . Carotid artery stenosis     60 %-80 %  . Dyslipidemia   . Anemia   . Sinus pain   . Syncope   . Diabetes mellitus type II   . Hypertension   . Hyperlipidemia   . Vitamin D deficiency    Surgical History:  Past Surgical History  Procedure Laterality Date  . Coronary artery bypass graft  2008    LIMA to the LAD, saphenous vein graft to diagonal, saphenous vein graft to OM, saphenous vein graft to posterolateral 2008   Family History:  Family History  Problem Relation Age of Onset  . Hypertension Father   . Heart disease Father   . Hyperlipidemia Father   . Heart attack Father   . Diabetes Sister   . Heart disease Brother    Social History:   History  Substance Use Topics  . Smoking status: Former Smoker    Quit date: 08/29/1995  . Smokeless tobacco: Not on file  . Alcohol Use: No    Review of Systems: [X]  = complains of  [ ]  = denies  General: Fatigue [ ]  Fever [ ]  Chills [ ]  Weakness [ ]   Insomnia [ ] Weight change [ ]  Night sweats [ ]   Change in appetite [ ]  Head: Head Trauma [ ]  Eyes: Wears glasses or corrective lens [ ]  Redness [ ]  Blurred vision [ ]  Diplopia [ ]  Discharge [ ]  Floaters [ ]  [id number]:[id number] [ ]  hearing loss [ ]  Tinnitus [ ]  Ear Discharge [ ]   Congestion [ ]  Sinus Pain [ ]  Post Nasal Drip [ ]  Nose Bleeds [ ]  Rhinorrhea [ ]     Difficulty Swallowing [ ]  Snoring [ ]  Sore Throat [ ]  Cardiac:  Chest pain/pressure [ ]  SOB [ ]  Orthopnea [ ]   Palpitations [ ]   Paroxysmal nocturnal dyspnea[ ]  Claudication [ ]  Edema [ ]  Difficulty walking around block or climbing stairs [ ]  Pulmonary: Cough [ ]  Wheezing[ ]   SOB [ ]   Pleurisy [ ]  Asthma [ ]  GI: Nausea [ ]  Vomiting[ ]  Dysphagia[ ]  Heartburn[ ]  Abdominal pain [ ]  Constipation [ ] ; Diarrhea [ ]  BRBPR [ ]  Melena[ ]  Bloating [ ]  Hemorrhoids [ ]  Incontinence [ ]  GU: Hematuria[ ]  Dysuria [ ]  Nocturia[ ]  Urgency [ ]   Hesitancy [ ]  Discharge [ ]  Frequency [ ]  Incontinence [ ]   Neuro: Headaches[ ]  Vertigo[ ]   Paresthesias[ ]  Spasm [ ]  Speech changes [ ]  Incoordination [ ]  Dizziness [ ]  Numbness [ ]  Ortho: Arthritis [ ]  Joint pain [ ]  Muscle pain [ ]  Joint swelling [ ]  Back Pain [ ]  Weakness [ ]  Stiffness [ ]  Skin:  Rash [ ]   Pruritis [ ]  Change in skin lesion [ ]  Change in hair [ ]  Change in nails [ ]  Psych: Depression[ ]  Anxiety[ ]  Stress [ ]  Confusion [ ]  Memory loss [ ]   Heme/Lymph: Bleeding [ ]  Bruising [ ]  History of anemia [ ]  Enlarged lymph nodes [ ]   Endocrine: Visual blurring [ ]  Paresthesia [ ]  Polyuria [ ]  Polydipsia [ ]  Polyphagia [ ]   Heat/cold intolerance [ ]  Hypoglycemia [ ]  Thyroid Issues [ ]  Diabetes [ ]   Physical Exam: Estimated body mass index is 27.51 kg/(m^2) as calculated from the following:   Height as of this encounter: 5' 1.5" (1.562 m).   Weight as of this encounter: 148 lb (67.132 kg). BP 130/80 mmHg  Pulse 76  Temp(Src) 98.2 F (36.8 C)  Resp 16  Ht 5' 1.5" (1.562 m)  Wt 148 lb (67.132 kg)  BMI 27.51 kg/m2 General Appearance: Well nourished, in no apparent distress.  Eyes: PERRLA, EOMs, conjunctiva no swelling or erythema, normal fundi and vessels.  Sinuses: No Frontal/maxillary tenderness  ENT/Mouth: Ext aud canals clear, normal light reflex with TMs without erythema, bulging. Good dentition. No erythema, swelling, or exudate on post pharynx. Tonsils  not swollen or erythematous. Hearing normal.  Neck: Supple, thyroid normal. No bruits  Respiratory: Respiratory effort normal, BS equal bilaterally without rales, rhonchi, wheezing or stridor.  Cardio: RRR without murmurs, rubs or gallops. Brisk peripheral pulses without edema.  Chest: symmetric, with normal excursions and percussion.  Abdomen: Soft, nontender, no guarding, rebound, hernias, masses, or organomegaly. .  Lymphatics: Non tender without lymphadenopathy.  Genitourinary: defer Musculoskeletal: Full ROM all peripheral extremities,5/5 strength, and normal gait.  Skin: Warm, dry without rashes, lesions, ecchymosis. Neuro: Cranial nerves intact, reflexes equal bilaterally. Normal muscle tone, no cerebellar symptoms. Sensation intact.  Psych: Awake and oriented X 3, normal affect, Insight and Judgment appropriate.   EKG: defer cardio AORTA SCAN: defer  [name] 10:34 AM [facility]

## 2014-02-03 LAB — FERRITIN: FERRITIN: 39 ng/mL (ref 22–322)

## 2014-02-03 LAB — HEPATIC FUNCTION PANEL
ALK PHOS: 59 U/L (ref 39–117)
ALT: 18 U/L (ref 0–53)
AST: 16 U/L (ref 0–37)
Albumin: 4.5 g/dL (ref 3.5–5.2)
BILIRUBIN INDIRECT: 0.4 mg/dL (ref 0.2–1.2)
BILIRUBIN TOTAL: 0.5 mg/dL (ref 0.2–1.2)
Bilirubin, Direct: 0.1 mg/dL (ref 0.0–0.3)
Total Protein: 7 g/dL (ref 6.0–8.3)

## 2014-02-03 LAB — BASIC METABOLIC PANEL WITH GFR
BUN: 13 mg/dL (ref 6–23)
CHLORIDE: 100 meq/L (ref 96–112)
CO2: 26 meq/L (ref 19–32)
Calcium: 9.6 mg/dL (ref 8.4–10.5)
Creat: 0.98 mg/dL (ref 0.50–1.35)
GFR, Est African American: >89 mL/min
GFR, Est Non African American: 81 mL/min
Glucose, Bld: 197 mg/dL — ABNORMAL HIGH (ref 70–99)
POTASSIUM: 5.5 meq/L — AB (ref 3.5–5.3)
Sodium: 134 mEq/L — ABNORMAL LOW (ref 135–145)

## 2014-02-03 LAB — MAGNESIUM: Magnesium: 1.8 mg/dL (ref 1.5–2.5)

## 2014-02-03 LAB — URINALYSIS, ROUTINE W REFLEX MICROSCOPIC
BILIRUBIN URINE: NEGATIVE
GLUCOSE, UA: NEGATIVE mg/dL
Hgb urine dipstick: NEGATIVE
Ketones, ur: NEGATIVE mg/dL
Leukocytes, UA: NEGATIVE
NITRITE: NEGATIVE
Protein, ur: NEGATIVE mg/dL
SPECIFIC GRAVITY, URINE: 1.005 (ref 1.005–1.030)
Urobilinogen, UA: 0.2 mg/dL (ref 0.0–1.0)
pH: 7 (ref 5.0–8.0)

## 2014-02-03 LAB — IRON AND TIBC
%SAT: 20 % (ref 20–55)
Iron: 76 ug/dL (ref 42–165)
TIBC: 376 ug/dL (ref 215–435)
UIBC: 300 ug/dL (ref 125–400)

## 2014-02-03 LAB — MICROALBUMIN / CREATININE URINE RATIO
Creatinine, Urine: 36.7 mg/dL
MICROALB/CREAT RATIO: 16.3 mg/g (ref 0.0–30.0)
Microalb, Ur: 0.6 mg/dL (ref <2.0)

## 2014-02-03 LAB — LIPID PANEL
CHOL/HDL RATIO: 2.6 ratio
Cholesterol: 126 mg/dL (ref 0–200)
HDL: 48 mg/dL (ref >39)
LDL CALC: 56 mg/dL (ref 0–99)
Triglycerides: 110 mg/dL (ref <150)
VLDL: 22 mg/dL (ref 0–40)

## 2014-02-03 LAB — CK: Total CK: 69 U/L (ref 7–232)

## 2014-02-03 LAB — HEMOGLOBIN A1C
Hgb A1c MFr Bld: 8.8 % — ABNORMAL HIGH (ref <5.7)
Mean Plasma Glucose: 206 mg/dL — ABNORMAL HIGH (ref <117)

## 2014-02-03 LAB — TSH: TSH: 2.789 u[IU]/mL (ref 0.350–4.500)

## 2014-02-03 LAB — VITAMIN D 25 HYDROXY (VIT D DEFICIENCY, FRACTURES): Vit D, 25-Hydroxy: 31 ng/mL (ref 30–100)

## 2014-02-03 LAB — URIC ACID: Uric Acid, Serum: 4.7 mg/dL (ref 4.0–7.8)

## 2014-02-03 LAB — INSULIN, FASTING: Insulin fasting, serum: 6.1 u[IU]/mL (ref 2.0–19.6)

## 2014-02-03 LAB — VITAMIN B12: Vitamin B-12: 395 pg/mL (ref 211–911)

## 2014-03-15 ENCOUNTER — Ambulatory Visit: Payer: Medicare Other | Admitting: Physician Assistant

## 2014-03-15 ENCOUNTER — Encounter: Payer: Self-pay | Admitting: Physician Assistant

## 2014-03-15 VITALS — BP 138/80 | HR 58 | Temp 98.2°F | Resp 16 | Ht 61.5 in | Wt 150.0 lb

## 2014-03-15 MED ORDER — SITAGLIPTIN PHOSPHATE 100 MG PO TABS: 100.0000 mg | ORAL_TABLET | Freq: Every day | ORAL | Status: DC

## 2014-03-15 NOTE — Progress Notes (Signed)
HPI A 64 y.o.male presents for one month follow up.  However, patient never came to pick up Januvia samples.  Told patient to start taking Januvia 100mg - 1 tablet daily to help lower sugar levels for diabetes.  Patient expressed understanding.  Told patient there is no reason for visit today.     ROS- not obtained   Discussed medication effects and SE's.  Pt agreed to treatment plan.  No charge for office visit.  Noel Rodier, Stephani Police, PA-C 9:02 AM [facility]

## 2014-04-12 ENCOUNTER — Ambulatory Visit: Payer: Self-pay | Admitting: Physician Assistant

## 2014-04-13 ENCOUNTER — Encounter: Payer: Self-pay | Admitting: Physician Assistant

## 2014-04-13 ENCOUNTER — Other Ambulatory Visit: Payer: Self-pay | Admitting: Physician Assistant

## 2014-04-13 ENCOUNTER — Ambulatory Visit (INDEPENDENT_AMBULATORY_CARE_PROVIDER_SITE_OTHER): Payer: Medicare Other | Admitting: Physician Assistant

## 2014-04-13 VITALS — BP 128/80 | HR 80 | Temp 97.9°F | Resp 16 | Ht 61.5 in | Wt 149.0 lb

## 2014-04-13 DIAGNOSIS — D649 Anemia, unspecified: Secondary | ICD-10-CM | POA: Diagnosis not present

## 2014-04-13 DIAGNOSIS — E1159 Type 2 diabetes mellitus with other circulatory complications: Secondary | ICD-10-CM | POA: Diagnosis not present

## 2014-04-13 DIAGNOSIS — R6889 Other general symptoms and signs: Secondary | ICD-10-CM | POA: Diagnosis not present

## 2014-04-13 DIAGNOSIS — Z0001 Encounter for general adult medical examination with abnormal findings: Secondary | ICD-10-CM

## 2014-04-13 DIAGNOSIS — I6523 Occlusion and stenosis of bilateral carotid arteries: Secondary | ICD-10-CM

## 2014-04-13 DIAGNOSIS — I10 Essential (primary) hypertension: Secondary | ICD-10-CM

## 2014-04-13 DIAGNOSIS — Z9181 History of falling: Secondary | ICD-10-CM

## 2014-04-13 DIAGNOSIS — Z1331 Encounter for screening for depression: Secondary | ICD-10-CM

## 2014-04-13 DIAGNOSIS — E559 Vitamin D deficiency, unspecified: Secondary | ICD-10-CM

## 2014-04-13 DIAGNOSIS — I2510 Atherosclerotic heart disease of native coronary artery without angina pectoris: Secondary | ICD-10-CM

## 2014-04-13 DIAGNOSIS — E782 Mixed hyperlipidemia: Secondary | ICD-10-CM

## 2014-04-13 DIAGNOSIS — E538 Deficiency of other specified B group vitamins: Secondary | ICD-10-CM | POA: Diagnosis not present

## 2014-04-13 LAB — CBC WITH DIFFERENTIAL/PLATELET
Basophils Absolute: 0.1 10*3/uL (ref 0.0–0.1)
Basophils Relative: 1 % (ref 0–1)
EOS PCT: 9 % — AB (ref 0–5)
Eosinophils Absolute: 0.8 10*3/uL — ABNORMAL HIGH (ref 0.0–0.7)
HEMATOCRIT: 37.9 % — AB (ref 39.0–52.0)
HEMOGLOBIN: 11.7 g/dL — AB (ref 13.0–17.0)
LYMPHS PCT: 21 % (ref 12–46)
Lymphs Abs: 1.8 10*3/uL (ref 0.7–4.0)
MCH: 19.8 pg — ABNORMAL LOW (ref 26.0–34.0)
MCHC: 30.9 g/dL (ref 30.0–36.0)
MCV: 64.1 fL — AB (ref 78.0–100.0)
Monocytes Absolute: 0.7 10*3/uL (ref 0.1–1.0)
Monocytes Relative: 8 % (ref 3–12)
NEUTROS ABS: 5.1 10*3/uL (ref 1.7–7.7)
Neutrophils Relative %: 61 % (ref 43–77)
Platelets: 302 10*3/uL (ref 150–400)
RBC: 5.91 MIL/uL — AB (ref 4.22–5.81)
RDW: 16.8 % — ABNORMAL HIGH (ref 11.5–15.5)
WBC: 8.4 10*3/uL (ref 4.0–10.5)

## 2014-04-13 LAB — HEPATIC FUNCTION PANEL
ALT: 21 U/L (ref 0–53)
AST: 17 U/L (ref 0–37)
Albumin: 3.8 g/dL (ref 3.5–5.2)
Alkaline Phosphatase: 58 U/L (ref 39–117)
Bilirubin, Direct: 0.1 mg/dL (ref 0.0–0.3)
Indirect Bilirubin: 0.4 mg/dL (ref 0.2–1.2)
Total Bilirubin: 0.5 mg/dL (ref 0.2–1.2)
Total Protein: 6.8 g/dL (ref 6.0–8.3)

## 2014-04-13 LAB — BASIC METABOLIC PANEL WITH GFR
BUN: 8 mg/dL (ref 6–23)
CO2: 26 mEq/L (ref 19–32)
CREATININE: 0.84 mg/dL (ref 0.50–1.35)
Calcium: 10.1 mg/dL (ref 8.4–10.5)
Chloride: 100 mEq/L (ref 96–112)
GFR, Est African American: >89 mL/min
GLUCOSE: 158 mg/dL — AB (ref 70–99)
POTASSIUM: 5.2 meq/L (ref 3.5–5.3)
Sodium: 135 mEq/L (ref 135–145)

## 2014-04-13 MED ORDER — SITAGLIPTIN PHOSPHATE 100 MG PO TABS: 100.0000 mg | ORAL_TABLET | Freq: Every day | ORAL | Status: DC

## 2014-04-13 NOTE — Patient Instructions (Signed)
Diabetes is a very complicated disease...lets simplify it.  An easy way to look at it to understand the complications is if you think of the extra sugar floating in your blood stream as glass shards floating through your blood stream.    Diabetes affects your small vessels first: 1) The glass shards (sugar) scraps down the tiny blood vessels in your eyes and lead to diabetic retinopathy, the leading cause of blindness in the [place]. Diabetes is the leading cause of newly diagnosed adult (74 to 66 years of age) blindness in the [place].  2) The glass shards scratches down the tiny vessels of your legs leading to nerve damage called neuropathy and can lead to amputations of your feet. More than 60% of all non-traumatic amputations of lower limbs occur in people with diabetes.  3) Over time the small vessels in your brain are shredded and closed off, individually this does not cause any problems but over a long period of time many of the small vessels being blocked can lead to Vascular Dementia.   4) Your kidney's are a filter system and have a "net" that keeps certain things in the body and lets bad things out. Sugar shreds this net and leads to kidney damage and eventually failure. Decreasing the sugar that is destroying the net and certain blood pressure medications can help stop or decrease progression of kidney disease. Diabetes was the primary cause of kidney failure in 44 percent of all new cases in 2011.  5) Diabetes also destroys the small vessels in your penis that lead to erectile dysfunction. Eventually the vessels are so damaged that you may not be responsive to cialis or viagra.   Diabetes and your large vessels: Your larger vessels consist of your coronary arteries in your heart and the carotid vessels to your brain. Diabetes or even increased sugars put you at 300% increased risk of heart attack and stroke and this is why.. The sugar scrapes down your large blood vessels and your body  sees this as an internal injury and tries to repair itself. Just like you get a scab on your skin, your platelets will stick to the blood vessel wall trying to heal it. This is why we have diabetics on low dose aspirin daily, this prevents the platelets from sticking and can prevent plaque formation. In addition, your body takes cholesterol and tries to shove it into the open wound. This is why we want your LDL, or bad cholesterol, below 70.   The combination of platelets and cholesterol over 5-10 years forms plaque that can break off and cause a heart attack or stroke.   PLEASE REMEMBER:  Diabetes is preventable! Up to 29 percent of complications and morbidities among individuals with type 2 diabetes can be prevented, delayed, or effectively treated and minimized with regular visits to a health professional, appropriate monitoring and medication, and a healthy diet and lifestyle.  ACE inhibitors are blood pressure medications that protect your heart and kidneys. It can cause two symptoms: The most common symptom is a dry cough/tickle in your throat that can happen the first day you take it or 5 years after you have been taking it. Please call us if you have this and we can switch it to a different medications. The least common side effect is called angioedema which is swelling of your lips and tongue and can cause problems with your breathing. This is a very very rare side effect but very serious. If this happens please  stop the medication and go to the ER.   Recommendations For Diabetic/Prediabetic Patients:   -  Take medications as prescribed  -  Recommend Dr Fara Olden Fuhrman's book "The End of Diabetes "  And "The End of Dieting"- Can get at  www.[employer].com and encourage also get the Audio CD book  - AVOID Animal products, ie. Meat - red/white, Poultry and Dairy/especially cheese - Exercise at least 5 times a week for 30 minutes or preferably daily.  - No Smoking - Drink less than 2 drinks a day.   - Monitor your feet for sores - Have yearly Eye Exams - Recommend annual Flu vaccine  - Recommend Pneumovax and Prevnar vaccines - Shingles Vaccine (Zostavax) if over 50 y.o.  Goals:   - BMI less than 24 - Fasting sugar less than 130 or less than 150 if tapering medicines to lose weight  - Systolic BP less than 532  - Diastolic BP less than 80 - Bad LDL Cholesterol less than 70 - Triglycerides less than 150

## 2014-04-13 NOTE — Progress Notes (Signed)
MEDICARE ANNUAL WELLNESS VISIT AND FOLLOW UP Assessment:   1. Atherosclerosis of native coronary artery of native heart without angina pectoris Control blood pressure, cholesterol, glucose, increase exercise.  Continue cardio follow up  2. Carotid stenosis, bilateral Control blood pressure, cholesterol, glucose, increase exercise.   3. Essential hypertension - continue medications, DASH diet, exercise and monitor at home. Call if greater than 130/80.  Wiling to get on ACE/ARB BUT leaving for [place] for 1 month and does not want to switch meds - CBC with Differential/Platelet - BASIC METABOLIC PANEL WITH GFR - Hepatic function panel  4. Type 2 diabetes mellitus with other circulatory complications Discussed general issues about diabetes pathophysiology and management., Educational material distributed., Suggested low cholesterol diet., Encouraged aerobic exercise., Discussed foot care., Reminded to get yearly retinal exam. Continue Januiva, LONG discussion about disease process and comorbidities, has better insight.  - [facility] DIABETES FOOT EXAM  5. Hyperlipidemia -continue medications, check lipids, decrease fatty foods, increase activity.  Goal <70  6. Anemia, unspecified anemia type Check CBC  7. Vitamin D deficiency Continue supplement  Too soon to check A1C, lipids, will follow up 2 months after [place].    Plan:   During the course of the visit the patient was educated and counseled about appropriate screening and preventive services including:    Pneumococcal vaccine   Influenza vaccine  Td vaccine  Screening electrocardiogram  Colorectal cancer screening  Diabetes screening  Glaucoma screening  Nutrition counseling   Screening recommendations, referrals: Vaccinations: Please see documentation below and orders this visit.  Nutrition assessed and recommended  Colonoscopy due 2024 Recommended yearly ophthalmology/optometry visit for glaucoma screening and  checkup Recommended yearly dental visit for hygiene and checkup Advanced directives - requested  Conditions/risks identified: BMI: Discussed weight loss, diet, and increase physical activity.  Increase physical activity: AHA recommends 150 minutes of physical activity a week.  Medications reviewed Diabetes is not at goal, ACE/ARB therapy: No, Reason not on Ace Inhibitor/ARB therapy:  hypotension Urinary Incontinence is not an issue: discussed non pharmacology and pharmacology options.  Fall risk: low- discussed PT, home fall assessment, medications.    Subjective:  William Haney is a 64 y.o. male who presents for Medicare Annual Wellness Visit and 3 month follow up for HTN, hyperlipidemia, diabetes, and vitamin D Def.  Date of last medicare wellness visit was is unknown.  His blood pressure has been controlled at home, today their BP is BP: 128/80 mmHg He does not workout. He denies chest pain, shortness of breath, dizziness.  He has a history of CAD s/p CABG in 2008, follows with Dr. Percival Spanish.  He is on cholesterol medication, welchol and crestor.His cholesterol is at goal. The cholesterol last visit was:   Lab Results  Component Value Date   CHOL 126 02/02/2014   HDL 48 02/02/2014   LDLCALC 56 02/02/2014   TRIG 110 02/02/2014   CHOLHDL 2.6 02/02/2014   He has been working on diet and exercise for diabetes,he is on 81mg  ASA,he is not on ACE/ARB due to hypotension, he is on glimeperide 4mg  once a day, metformin, and januvia 100mg  was added last visit, sugars are running 140's and denies paresthesia of the feet, polydipsia, polyuria and visual disturbances. Last A1C in the office was:  Lab Results  Component Value Date   HGBA1C 8.8* 02/02/2014   Patient is on Vitamin D supplement.   Lab Results  Component Value Date   VD25OH 31 02/02/2014     Complains of right hand  numbness at 4th and 5th digit for past week, no pain, good grip.  He follows with Dr. Berenice Primas for knee pain.     Names of Other Physician/Practitioners you currently use: 1. [facility] here for primary care 2. [facility], Dr. Clydene Laming, eye doctor, last visit Jan 2015, due 3. Dr. Burt Knack, dentist, last visit q 6 months Patient Care Team: Unk Pinto, MD as PCP - General (Internal Medicine) Jenel Lucks. Clydene Laming, MD as Consulting Physician (Ophthalmology) Minus Breeding, MD as Consulting Physician (Cardiology) Jeryl Columbia, MD as Consulting Physician (Gastroenterology) Berenice Primas, MD as Referring Physician (Orthopedic Surgery)  Medication Review: Current Outpatient Prescriptions on File Prior to Visit  Medication Sig Dispense Refill  . aspirin 81 MG tablet Take 81 mg by mouth daily.    [name] glimepiride (AMARYL) 4 MG tablet TAKE 1 TABLET BY MOUTH TWICE A DAY 60 tablet 5  . meloxicam (MOBIC) 15 MG tablet     . metFORMIN (GLUCOPHAGE) 500 MG tablet Take 2 tablets by mouth every morning and take 2 tablets every evening 120 tablet 3  . metoprolol tartrate (LOPRESSOR) 25 MG tablet 1 tablet twice daily 180 tablet 3  . Multiple Vitamin (MULTIVITAMIN) tablet Take 1 tablet by mouth daily.    . rosuvastatin (CRESTOR) 20 MG tablet Take 1 tablet (20 mg total) by mouth daily. 30 tablet 11  . sitaGLIPtin (JANUVIA) 100 MG tablet Take 1 tablet (100 mg total) by mouth daily. 30 tablet 0  . WELCHOL 625 MG tablet TAKE 3 TABLETS BY MOUTH TWICE A DAY 180 tablet 5   No current facility-administered medications on file prior to visit.    Current Problems (verified) Patient Active Problem List   Diagnosis Date Noted  . Essential hypertension 10/11/2013  . Vitamin D deficiency 10/11/2013  . Encounter for long-term (current) use of other medications 10/11/2013  . T2_NIDDM 08/29/2011  . Hyperlipidemia 03/01/2010  . ANEMIA 03/01/2010  . Coronary atherosclerosis 03/01/2010  . CAROTID STENOSIS 03/01/2010    Screening Tests Health Maintenance  Topic Date Due  . OPHTHALMOLOGY EXAM   06/08/1958  . COLONOSCOPY  06/08/1998  . ZOSTAVAX  06/07/2008  . TETANUS/TDAP  04/18/2014  . HEMOGLOBIN A1C  08/03/2014  . INFLUENZA VACCINE  10/10/2014  . FOOT EXAM  02/03/2015  . URINE MICROALBUMIN  02/03/2015  . PNEUMOCOCCAL POLYSACCHARIDE VACCINE AGE 55 AND OVER  Completed    Immunization History  Administered Date(s) Administered  . Influenza Whole 12/10/2012  . Influenza-Unspecified 12/31/2013  . Pneumococcal Conjugate-13 02/02/2014  . Pneumococcal Polysaccharide-23 04/13/2003  . Td 04/18/2004   Preventative care: Tetanus:2006 DUE this year Pneumovax: 2005 Prevnar 13: 2015 Flu vaccine:2015 Zostavax: declines DEXA: declines Colonoscopy: 2014 due 2024 EGD:N/A Echo 01/2008 MRI head 03/2007 Carotid US- 08/2013 CXR 01/2013   Past Surgical History  Procedure Laterality Date  . Coronary artery bypass graft  2008    LIMA to the LAD, saphenous vein graft to diagonal, saphenous vein graft to OM, saphenous vein graft to posterolateral 2008   Family History  Problem Relation Age of Onset  . Hypertension Father   . Heart disease Father   . Hyperlipidemia Father   . Heart attack Father   . Diabetes Sister   . Heart disease Brother     History reviewed: allergies, current medications, past family history, past medical history, past social history, past surgical history and problem list   Risk Factors: Tobacco History  Substance Use Topics  . Smoking status: Former Smoker  Quit date: 08/29/1995  . Smokeless tobacco: Not on file  . Alcohol Use: No   He does not smoke.  Patient is a former smoker. Are there smokers in your home (other than you)?  No  Alcohol Current alcohol use: none  Caffeine Current caffeine use: coffee 1 /day  Exercise Current exercise: walking  Nutrition/Diet Current diet: in general, a "healthy" diet    Cardiac risk factors: advanced age (older than 17 for men, 85 for women), diabetes mellitus, dyslipidemia, hypertension, male  gender and sedentary lifestyle.  Depression Screen (Note: if answer to either of the following is "Yes", a more complete depression screening is indicated)   Q1: Over the past two weeks, have you felt down, depressed or hopeless? No  Q2: Over the past two weeks, have you felt little interest or pleasure in doing things? No  Have you lost interest or pleasure in daily life? No  Do you often feel hopeless? No  Do you cry easily over simple problems? No  Activities of Daily Living In your present state of health, do you have any difficulty performing the following activities?:  Driving? No Managing money?  No Feeding yourself? No Getting from bed to chair? No Climbing a flight of stairs? No Preparing food and eating?: No Bathing or showering? No Getting dressed: No Getting to the toilet? No Using the toilet:No Moving around from place to place: No In the past year have you fallen or had a near fall?:No   Are you sexually active?  Yes  Do you have more than one partner?  No  Vision Difficulties: No  Hearing Difficulties: No Do you often ask people to speak up or repeat themselves? No Do you experience ringing or noises in your ears? No Do you have difficulty understanding soft or whispered voices? No  Cognition  Do you feel that you have a problem with memory?No  Do you often misplace items? No  Do you feel safe at home?  Yes  Advanced directives Does patient have a [place]? Yes Does patient have a Living Will? Yes   Objective:   Blood pressure 128/80, pulse 80, temperature 97.9 F (36.6 C), resp. rate 16, height 5' 1.5" (1.562 m), weight 149 lb (67.586 kg). Body mass index is 27.7 kg/(m^2).  General appearance: alert, no distress, WD/WN, male Cognitive Testing  Alert? Yes  Normal Appearance?Yes  Oriented to person? Yes  Place? Yes   Time? Yes  Recall of three objects?  Yes  Can perform simple calculations? Yes  Displays appropriate  judgment?Yes  Can read the correct time from a watch face?Yes  HEENT: normocephalic, sclerae anicteric, TMs pearly, nares patent, no discharge or erythema, pharynx normal Oral cavity: MMM, no lesions Neck: supple, no lymphadenopathy, no thyromegaly, no masses Heart: RRR, normal S1, S2, no murmurs Lungs: CTA bilaterally, no wheezes, rhonchi, or rales Abdomen: +bs, soft, non tender, non distended, no masses, no hepatomegaly, no splenomegaly Musculoskeletal: nontender, no swelling, no obvious deformity Extremities: no edema, no cyanosis, no clubbing Pulses: 2+ symmetric, upper and lower extremities, normal cap refill Neurological: alert, oriented x 3, CN2-12 intact, strength normal upper extremities and lower extremities, sensation normal throughout, DTRs 2+ throughout, no cerebellar signs, gait normal Psychiatric: normal affect, behavior normal, pleasant   Medicare Attestation I have personally reviewed: The patient's medical and social history Their use of alcohol, tobacco or illicit drugs Their current medications and supplements The patient's functional ability including ADLs,fall risks, home safety risks, cognitive,  and hearing and visual impairment Diet and physical activities Evidence for depression or mood disorders  The patient's weight, height, BMI, and visual acuity have been recorded in the chart.  I have made referrals, counseling, and provided education to the patient based on review of the above and I have provided the patient with a written personalized care plan for preventive services.     [name], PA-C   04/13/2014

## 2014-04-14 DIAGNOSIS — M170 Bilateral primary osteoarthritis of knee: Secondary | ICD-10-CM | POA: Diagnosis not present

## 2014-04-14 LAB — IRON AND TIBC
%SAT: 20 % (ref 20–55)
IRON: 72 ug/dL (ref 42–165)
TIBC: 368 ug/dL (ref 215–435)
UIBC: 296 ug/dL (ref 125–400)

## 2014-04-14 LAB — VITAMIN B12: Vitamin B-12: 378 pg/mL (ref 211–911)

## 2014-04-14 LAB — FERRITIN: FERRITIN: 38 ng/mL (ref 22–322)

## 2014-04-20 ENCOUNTER — Telehealth: Payer: Self-pay

## 2014-04-20 NOTE — Telephone Encounter (Signed)
Patient aware of lab results and instructions for DOS 04/13/14

## 2014-05-11 ENCOUNTER — Other Ambulatory Visit: Payer: Self-pay | Admitting: Internal Medicine

## 2014-05-19 ENCOUNTER — Encounter: Payer: Self-pay | Admitting: Internal Medicine

## 2014-05-19 ENCOUNTER — Ambulatory Visit (INDEPENDENT_AMBULATORY_CARE_PROVIDER_SITE_OTHER): Payer: Medicare Other | Admitting: Internal Medicine

## 2014-05-19 VITALS — BP 120/78 | HR 68 | Temp 98.0°F | Resp 18 | Ht 61.5 in | Wt 143.0 lb

## 2014-05-19 DIAGNOSIS — J0140 Acute pansinusitis, unspecified: Secondary | ICD-10-CM

## 2014-05-19 MED ORDER — AZITHROMYCIN 250 MG PO TABS: ORAL_TABLET | ORAL | Status: AC

## 2014-05-19 MED ORDER — SITAGLIPTIN PHOSPHATE 100 MG PO TABS: 100.0000 mg | ORAL_TABLET | Freq: Every day | ORAL | Status: DC

## 2014-05-19 MED ORDER — LIDOCAINE VISCOUS 2 % MT SOLN: 20.0000 mL | OROMUCOSAL | Status: DC | PRN

## 2014-05-19 NOTE — Patient Instructions (Signed)

## 2014-05-19 NOTE — Progress Notes (Signed)
Patient ID: William Haney, male   DOB: March 03, 1951, 64 y.o.   MRN: [id number]  HPI  Patient presents to the office for evaluation of cough.  It has been going on for 2 days.  Patient reports night > day, wet, worse with lying down.  They also endorse change in voice, chills, postnasal drip, sputum production and clear sputum production.  They have tried antihistamines or gargling salt water, tylenol, and advil.  They report that nothing has worked.  They admits to other sick contacts.  Son has similar symptoms.    Review of Systems  Constitutional: Positive for chills. Negative for fever, weight loss, malaise/fatigue and diaphoresis.  HENT: Positive for congestion and sore throat. Negative for ear discharge, ear pain, hearing loss, nosebleeds and tinnitus.   Eyes: Negative.   Respiratory: Positive for cough. Negative for sputum production, shortness of breath, wheezing and stridor.   Cardiovascular: Negative for chest pain and leg swelling.  Gastrointestinal: Negative.   Genitourinary: Negative.   Musculoskeletal: Negative.   Skin: Negative.   Neurological: Positive for headaches. Negative for weakness.    PE:  Filed Vitals:   05/19/14 1002  BP: 120/78  Pulse: 68  Temp: 98 F (36.7 C)  Resp: 18    General:  Alert and non-toxic, WDWN, NAD HEENT: NCAT, PERLA, EOM normal, no occular discharge or erythema.  Nasal mucosal edema with sinus tenderness to palpation.  Oropharynx clear with minimal oropharyngeal edema and erythema.  Mucous membranes moist and pink. Neck:  Cervical adenopathy Chest:  RRR no MRGs.  Lungs clear to auscultation A&P with no wheezes rhonchi or rales.   Abdomen: +BS x 4 quadrants, soft, non-tender, no guarding, rigidity, or rebound. Skin: warm and dry no rash Neuro: A&Ox4, CN II-XII grossly intact  Assessment and Plan:   1. Acute pansinusitis, recurrence not specified Symptomatic therapy discussed.  Doubt strep throat given centor score of 1.  Will be covered  with zpak.  Suspect that this is likely viral URI vs. Sinusitis.  Will cover with zpak and treat symptomatically with viscous lidocaine.  Patient has scheduled f/u in 06/29/14.  - azithromycin (ZITHROMAX Z-PAK) 250 MG tablet; Take 2 tablets (500 mg) on  Day 1,  followed by 1 tablet (250 mg) once daily on Days 2 through 5.  Dispense: 6 each; Refill: 0 - lidocaine (XYLOCAINE) 2 % solution; Use as directed 20 mLs in the mouth or throat as needed for mouth pain.  Dispense: 100 mL; Refill: 0

## 2014-06-08 ENCOUNTER — Other Ambulatory Visit: Payer: Self-pay | Admitting: Physician Assistant

## 2014-06-29 ENCOUNTER — Encounter: Payer: Self-pay | Admitting: Physician Assistant

## 2014-06-29 ENCOUNTER — Ambulatory Visit (INDEPENDENT_AMBULATORY_CARE_PROVIDER_SITE_OTHER): Payer: Medicare Other | Admitting: Physician Assistant

## 2014-06-29 VITALS — BP 142/80 | HR 76 | Temp 97.7°F | Resp 16 | Ht 61.5 in | Wt 147.0 lb

## 2014-06-29 DIAGNOSIS — E1159 Type 2 diabetes mellitus with other circulatory complications: Secondary | ICD-10-CM | POA: Diagnosis not present

## 2014-06-29 DIAGNOSIS — E782 Mixed hyperlipidemia: Secondary | ICD-10-CM

## 2014-06-29 DIAGNOSIS — I10 Essential (primary) hypertension: Secondary | ICD-10-CM | POA: Diagnosis not present

## 2014-06-29 DIAGNOSIS — E559 Vitamin D deficiency, unspecified: Secondary | ICD-10-CM | POA: Diagnosis not present

## 2014-06-29 DIAGNOSIS — Z79899 Other long term (current) drug therapy: Secondary | ICD-10-CM

## 2014-06-29 DIAGNOSIS — I2510 Atherosclerotic heart disease of native coronary artery without angina pectoris: Secondary | ICD-10-CM | POA: Diagnosis not present

## 2014-06-29 DIAGNOSIS — D649 Anemia, unspecified: Secondary | ICD-10-CM

## 2014-06-29 DIAGNOSIS — I6523 Occlusion and stenosis of bilateral carotid arteries: Secondary | ICD-10-CM

## 2014-06-29 LAB — CBC WITH DIFFERENTIAL/PLATELET
Basophils Absolute: 0.1 10*3/uL (ref 0.0–0.1)
Basophils Relative: 1 % (ref 0–1)
EOS ABS: 0.6 10*3/uL (ref 0.0–0.7)
Eosinophils Relative: 8 % — ABNORMAL HIGH (ref 0–5)
HEMATOCRIT: 36.7 % — AB (ref 39.0–52.0)
Hemoglobin: 11.1 g/dL — ABNORMAL LOW (ref 13.0–17.0)
Lymphocytes Relative: 22 % (ref 12–46)
Lymphs Abs: 1.6 10*3/uL (ref 0.7–4.0)
MCH: 19.9 pg — ABNORMAL LOW (ref 26.0–34.0)
MCHC: 30.2 g/dL (ref 30.0–36.0)
MCV: 65.7 fL — AB (ref 78.0–100.0)
MONO ABS: 0.7 10*3/uL (ref 0.1–1.0)
MONOS PCT: 10 % (ref 3–12)
NEUTROS ABS: 4.3 10*3/uL (ref 1.7–7.7)
Neutrophils Relative %: 59 % (ref 43–77)
Platelets: 268 10*3/uL (ref 150–400)
RBC: 5.59 MIL/uL (ref 4.22–5.81)
RDW: 17.3 % — AB (ref 11.5–15.5)
WBC: 7.3 10*3/uL (ref 4.0–10.5)

## 2014-06-29 NOTE — Progress Notes (Signed)
Assessment and Plan:  1. Hypertension -Continue medication, monitor blood pressure at home. Continue DASH diet.  Reminder to go to the ER if any CP, SOB, nausea, dizziness, severe HA, changes vision/speech, left arm numbness and tingling and jaw pain.  2. Cholesterol -Continue diet and exercise. Check cholesterol.   3. Diabetes with other circulatory complications -Continue diet and exercise. Check A1C  4. Vitamin D Def - check level and continue medications.   5. Atherosclerosis of native coronary artery of native heart without angina pectoris Control blood pressure, cholesterol, glucose, increase exercise.  Continue cardio follow up  6. Carotid stenosis, bilateral Control blood pressure, cholesterol, glucose, increase exercise.   Continue diet and meds as discussed. Further disposition pending results of labs. Discussed med's effects and SE's.    Over 30 minutes of exam, counseling, chart review, and critical decision making was performed   HPI 64 y.o. male  presents for 3 month follow up on hypertension, cholesterol, diabetes and vitamin D deficiency.   His blood pressure has been controlled at home, today their BP is BP: 128/80 mmHg He has started to workout. He denies chest pain, shortness of breath, dizziness.  He has a history of CAD s/p CABG in 2008, follows with Dr. Percival Spanish.  He is on cholesterol medication, welchol 625 mg 3 tabs BID and crestor 20mg . His cholesterol is at goal. The cholesterol last visit was:   Lab Results  Component Value Date   CHOL 126 02/02/2014   HDL 48 02/02/2014   LDLCALC 56 02/02/2014   TRIG 110 02/02/2014   CHOLHDL 2.6 02/02/2014   He has been working on diet and exercise for diabetes with history of CAD s/p CABG and PAD,he is on 81mg  ASA,he is not on ACE/ARB due to hypotension, he is on glimeperide 4mg  once a day, metformin, and januvia 100mg  was added last visit, sugars are running 140's and denies paresthesia of the feet, polydipsia,  polyuria and visual disturbances. Last A1C in the office was:  Lab Results  Component Value Date   HGBA1C 8.8* 02/02/2014   Patient is on Vitamin D supplement.   Lab Results  Component Value Date   VD25OH 31 02/02/2014   Complains of right hand numbness at 4th and 5th digit for past week, no pain, good grip.  He follows with Dr. Berenice Primas for knee pain.  He was started on iron last time for iron def.  Lab Results  Component Value Date   IRON 72 04/13/2014   TIBC 368 04/13/2014   FERRITIN 38 04/13/2014   Lab Results  Component Value Date   WBC 8.4 04/13/2014   HGB 11.7* 04/13/2014   HCT 37.9* 04/13/2014   MCV 64.1* 04/13/2014   PLT 302 04/13/2014    Current Medications:  Current Outpatient Prescriptions on File Prior to Visit  Medication Sig Dispense Refill  . aspirin 81 MG tablet Take 81 mg by mouth daily.    [name] glimepiride (AMARYL) 4 MG tablet TAKE 1 TABLET BY MOUTH TWICE A DAY 60 tablet 5  . lidocaine (XYLOCAINE) 2 % solution Use as directed 20 mLs in the mouth or throat as needed for mouth pain. 100 mL 0  . meloxicam (MOBIC) 15 MG tablet     . metFORMIN (GLUCOPHAGE) 500 MG tablet TAKE 2 TABLETS BY MOUTH EVERY MORNING AND 2 EVERY EVENING 120 tablet 0  . metoprolol tartrate (LOPRESSOR) 25 MG tablet 1 tablet twice daily 180 tablet 3  . Multiple Vitamin (MULTIVITAMIN) tablet Take 1 tablet  by mouth daily.    . rosuvastatin (CRESTOR) 20 MG tablet Take 1 tablet (20 mg total) by mouth daily. 30 tablet 11  . sitaGLIPtin (JANUVIA) 100 MG tablet Take 1 tablet (100 mg total) by mouth daily. 30 tablet 3  . WELCHOL 625 MG tablet TAKE 3 TABLETS BY MOUTH TWICE A DAY 180 tablet 5   No current facility-administered medications on file prior to visit.   Medical History:  Past Medical History  Diagnosis Date  . Coronary artery disease     s/p CABG in 2008  . Carotid artery stenosis     60 %-80 %  . Dyslipidemia   . Anemia   . Sinus pain   . Syncope   . Diabetes mellitus type II   .  Hypertension   . Hyperlipidemia   . Vitamin D deficiency    Allergies:  Allergies  Allergen Reactions  . Statins Other (See Comments)    Elevated LFT's    Review of Systems:  Review of Systems  Constitutional: Negative.   HENT: Negative.   Eyes: Negative.   Respiratory: Negative.   Cardiovascular: Negative.   Gastrointestinal: Negative.   Genitourinary: Negative.   Musculoskeletal: Negative.   Skin: Negative.   Neurological: Negative.   Endo/Heme/Allergies: Negative.   Psychiatric/Behavioral: Negative.     Family history- Review and unchanged Social history- Review and unchanged Physical Exam: BP 142/80 mmHg  Pulse 76  Temp(Src) 97.7 F (36.5 C)  Resp 16  Ht 5' 1.5" (1.562 m)  Wt 147 lb (66.679 kg)  BMI 27.33 kg/m2 Wt Readings from Last 3 Encounters:  06/29/14 147 lb (66.679 kg)  05/19/14 143 lb (64.864 kg)  04/13/14 149 lb (67.586 kg)   General Appearance: Well nourished, in no apparent distress. Eyes: PERRLA, EOMs, conjunctiva no swelling or erythema Sinuses: No Frontal/maxillary tenderness ENT/Mouth: Ext aud canals clear, TMs without erythema, bulging. No erythema, swelling, or exudate on post pharynx.  Tonsils not swollen or erythematous. Hearing normal.  Neck: Supple, thyroid normal.  Respiratory: Respiratory effort normal, BS equal bilaterally without rales, rhonchi, wheezing or stridor.  Cardio: RRR with no MRGs. Brisk peripheral pulses without edema.  Abdomen: Soft, + BS.  Non tender, no guarding, rebound, hernias, masses. Lymphatics: Non tender without lymphadenopathy.  Musculoskeletal: Full ROM, 5/5 strength, Normal gait Skin: Warm, dry without rashes, lesions, ecchymosis.  Neuro: Cranial nerves intact. No cerebellar symptoms.  Psych: Awake and oriented X 3, normal affect, Insight and Judgment appropriate.    [name], PA-C 9:46 AM [facility]

## 2014-06-29 NOTE — Patient Instructions (Signed)
Diabetes is a very complicated disease...lets simplify it.  An easy way to look at it to understand the complications is if you think of the extra sugar floating in your blood stream as glass shards floating through your blood stream.    Diabetes affects your small vessels first: 1) The glass shards (sugar) scraps down the tiny blood vessels in your eyes and lead to diabetic retinopathy, the leading cause of blindness in the [place]. Diabetes is the leading cause of newly diagnosed adult (33 to 66 years of age) blindness in the [place].  2) The glass shards scratches down the tiny vessels of your legs leading to nerve damage called neuropathy and can lead to amputations of your feet. More than 60% of all non-traumatic amputations of lower limbs occur in people with diabetes.  3) Over time the small vessels in your brain are shredded and closed off, individually this does not cause any problems but over a long period of time many of the small vessels being blocked can lead to Vascular Dementia.   4) Your kidney's are a filter system and have a "net" that keeps certain things in the body and lets bad things out. Sugar shreds this net and leads to kidney damage and eventually failure. Decreasing the sugar that is destroying the net and certain blood pressure medications can help stop or decrease progression of kidney disease. Diabetes was the primary cause of kidney failure in 44 percent of all new cases in 2011.  5) Diabetes also destroys the small vessels in your penis that lead to erectile dysfunction. Eventually the vessels are so damaged that you may not be responsive to cialis or viagra.   Diabetes and your large vessels: Your larger vessels consist of your coronary arteries in your heart and the carotid vessels to your brain. Diabetes or even increased sugars put you at 300% increased risk of heart attack and stroke and this is why.. The sugar scrapes down your large blood vessels and your body  sees this as an internal injury and tries to repair itself. Just like you get a scab on your skin, your platelets will stick to the blood vessel wall trying to heal it. This is why we have diabetics on low dose aspirin daily, this prevents the platelets from sticking and can prevent plaque formation. In addition, your body takes cholesterol and tries to shove it into the open wound. This is why we want your LDL, or bad cholesterol, below 70.   The combination of platelets and cholesterol over 5-10 years forms plaque that can break off and cause a heart attack or stroke.   PLEASE REMEMBER:  Diabetes is preventable! Up to 40 percent of complications and morbidities among individuals with type 2 diabetes can be prevented, delayed, or effectively treated and minimized with regular visits to a health professional, appropriate monitoring and medication, and a healthy diet and lifestyle.   Your A1C is a measure of your sugar over the past 3 months and is not affected by what you have eaten over the past few days. Diabetes increases your chances of stroke and heart attack over 300 % and is the leading cause of blindness and kidney failure in the [place]. Please make sure you decrease bad carbs like white bread, white rice, potatoes, corn, soft drinks, pasta, cereals, refined sugars, sweet tea, dried fruits, and fruit juice. Good carbs are okay to eat in moderation like sweet potatoes, brown rice, whole grain pasta/bread, most fruit (except  dried fruit) and you can eat as many veggies as you want.   Greater than 6.5 is considered diabetic. Between 6.4 and 5.7 is prediabetic If your A1C is less than 5.7 you are NOT diabetic.  Targets for Glucose Readings: Time of Check Target for patients WITHOUT Diabetes Target for DIABETICS  Before Meals Less than 100  less than 150  Two hours after meals Less than 200  Less than 250    Recommendations For Diabetic/Prediabetic Patients:   -  Take medications as  prescribed  -  Recommend Dr Fara Olden Fuhrman's book "The End of Diabetes "  And "The End of Dieting"- Can get at  www.[employer].com and encourage also get the Audio CD book  - AVOID Animal products, ie. Meat - red/white, Poultry and Dairy/especially cheese - Exercise at least 5 times a week for 30 minutes or preferably daily.  - No Smoking - Drink less than 2 drinks a day.  - Monitor your feet for sores - Have yearly Eye Exams - Recommend annual Flu vaccine  - Recommend Pneumovax and Prevnar vaccines - Shingles Vaccine (Zostavax) if over 60 y.o.  Goals:   - BMI less than 24 - Fasting sugar less than 130 or less than 150 if tapering medicines to lose weight  - Systolic BP less than 370  - Diastolic BP less than 80 - Bad LDL Cholesterol less than 70 - Triglycerides less than 150

## 2014-06-30 LAB — HEPATIC FUNCTION PANEL
ALBUMIN: 3.7 g/dL (ref 3.5–5.2)
ALT: 17 U/L (ref 0–53)
AST: 15 U/L (ref 0–37)
Alkaline Phosphatase: 65 U/L (ref 39–117)
Bilirubin, Direct: 0.1 mg/dL (ref 0.0–0.3)
Indirect Bilirubin: 0.4 mg/dL (ref 0.2–1.2)
TOTAL PROTEIN: 6.2 g/dL (ref 6.0–8.3)
Total Bilirubin: 0.5 mg/dL (ref 0.2–1.2)

## 2014-06-30 LAB — LIPID PANEL
CHOL/HDL RATIO: 2.6 ratio
CHOLESTEROL: 102 mg/dL (ref 0–200)
HDL: 39 mg/dL — AB (ref >=40)
LDL Cholesterol: 50 mg/dL (ref 0–99)
Triglycerides: 64 mg/dL (ref <150)
VLDL: 13 mg/dL (ref 0–40)

## 2014-06-30 LAB — BASIC METABOLIC PANEL WITH GFR
BUN: 14 mg/dL (ref 6–23)
CO2: 24 mEq/L (ref 19–32)
CREATININE: 0.89 mg/dL (ref 0.50–1.35)
Calcium: 9.5 mg/dL (ref 8.4–10.5)
Chloride: 103 mEq/L (ref 96–112)
GFR, EST NON AFRICAN AMERICAN: 89 mL/min
Glucose, Bld: 203 mg/dL — ABNORMAL HIGH (ref 70–99)
Potassium: 5.5 mEq/L — ABNORMAL HIGH (ref 3.5–5.3)
Sodium: 135 mEq/L (ref 135–145)

## 2014-06-30 LAB — MAGNESIUM: MAGNESIUM: 1.6 mg/dL (ref 1.5–2.5)

## 2014-06-30 LAB — HEMOGLOBIN A1C
Hgb A1c MFr Bld: 9.7 % — ABNORMAL HIGH
Mean Plasma Glucose: 232 mg/dL — ABNORMAL HIGH

## 2014-06-30 LAB — TSH: TSH: 2.087 u[IU]/mL (ref 0.350–4.500)

## 2014-06-30 LAB — VITAMIN D 25 HYDROXY (VIT D DEFICIENCY, FRACTURES): Vit D, 25-Hydroxy: 27 ng/mL — ABNORMAL LOW (ref 30–100)

## 2014-07-01 ENCOUNTER — Other Ambulatory Visit: Payer: Self-pay

## 2014-07-01 MED ORDER — ERGOCALCIFEROL 1.25 MG (50000 UT) PO CAPS: 50000.0000 [IU] | ORAL_CAPSULE | ORAL | Status: DC

## 2014-07-06 ENCOUNTER — Other Ambulatory Visit: Payer: Self-pay | Admitting: Internal Medicine

## 2014-08-13 ENCOUNTER — Other Ambulatory Visit: Payer: Self-pay | Admitting: Internal Medicine

## 2014-08-13 ENCOUNTER — Other Ambulatory Visit: Payer: Self-pay | Admitting: Physician Assistant

## 2014-08-13 DIAGNOSIS — E119 Type 2 diabetes mellitus without complications: Secondary | ICD-10-CM

## 2014-08-13 MED ORDER — METFORMIN HCL ER 500 MG PO TB24: ORAL_TABLET | ORAL | Status: DC

## 2014-08-15 ENCOUNTER — Other Ambulatory Visit: Payer: Self-pay | Admitting: Physician Assistant

## 2014-08-15 MED ORDER — ATORVASTATIN CALCIUM 80 MG PO TABS: 80.0000 mg | ORAL_TABLET | Freq: Every day | ORAL | Status: DC

## 2014-09-20 ENCOUNTER — Other Ambulatory Visit: Payer: Self-pay | Admitting: Cardiovascular Disease

## 2014-09-20 DIAGNOSIS — I6529 Occlusion and stenosis of unspecified carotid artery: Secondary | ICD-10-CM

## 2014-09-21 ENCOUNTER — Encounter: Payer: Self-pay | Admitting: Physician Assistant

## 2014-09-21 DIAGNOSIS — Z0000 Encounter for general adult medical examination without abnormal findings: Secondary | ICD-10-CM | POA: Insufficient documentation

## 2014-09-30 ENCOUNTER — Ambulatory Visit (INDEPENDENT_AMBULATORY_CARE_PROVIDER_SITE_OTHER): Payer: Medicare Other | Admitting: Physician Assistant

## 2014-09-30 ENCOUNTER — Encounter: Payer: Self-pay | Admitting: Physician Assistant

## 2014-09-30 VITALS — BP 138/82 | HR 68 | Temp 98.1°F | Resp 16 | Ht 61.5 in

## 2014-09-30 DIAGNOSIS — E559 Vitamin D deficiency, unspecified: Secondary | ICD-10-CM | POA: Diagnosis not present

## 2014-09-30 DIAGNOSIS — I10 Essential (primary) hypertension: Secondary | ICD-10-CM | POA: Diagnosis not present

## 2014-09-30 DIAGNOSIS — E782 Mixed hyperlipidemia: Secondary | ICD-10-CM | POA: Diagnosis not present

## 2014-09-30 DIAGNOSIS — E1159 Type 2 diabetes mellitus with other circulatory complications: Secondary | ICD-10-CM | POA: Diagnosis not present

## 2014-09-30 DIAGNOSIS — I2510 Atherosclerotic heart disease of native coronary artery without angina pectoris: Secondary | ICD-10-CM | POA: Diagnosis not present

## 2014-09-30 DIAGNOSIS — D649 Anemia, unspecified: Secondary | ICD-10-CM

## 2014-09-30 DIAGNOSIS — Z79899 Other long term (current) drug therapy: Secondary | ICD-10-CM | POA: Diagnosis not present

## 2014-09-30 LAB — LIPID PANEL
CHOL/HDL RATIO: 2.9 ratio
Cholesterol: 100 mg/dL (ref 0–200)
HDL: 34 mg/dL — ABNORMAL LOW (ref >=40)
LDL Cholesterol: 40 mg/dL (ref 0–99)
TRIGLYCERIDES: 129 mg/dL (ref <150)
VLDL: 26 mg/dL (ref 0–40)

## 2014-09-30 LAB — CBC WITH DIFFERENTIAL/PLATELET
BASOS ABS: 0.1 10*3/uL (ref 0.0–0.1)
BASOS PCT: 1 % (ref 0–1)
Eosinophils Absolute: 0.7 10*3/uL (ref 0.0–0.7)
Eosinophils Relative: 9 % — ABNORMAL HIGH (ref 0–5)
HEMATOCRIT: 37.3 % — AB (ref 39.0–52.0)
Hemoglobin: 11.2 g/dL — ABNORMAL LOW (ref 13.0–17.0)
Lymphocytes Relative: 26 % (ref 12–46)
Lymphs Abs: 2.2 10*3/uL (ref 0.7–4.0)
MCH: 19.9 pg — AB (ref 26.0–34.0)
MCHC: 30 g/dL (ref 30.0–36.0)
MCV: 66.1 fL — ABNORMAL LOW (ref 78.0–100.0)
Monocytes Absolute: 0.4 10*3/uL (ref 0.1–1.0)
Monocytes Relative: 5 % (ref 3–12)
NEUTROS ABS: 4.9 10*3/uL (ref 1.7–7.7)
Neutrophils Relative %: 59 % (ref 43–77)
Platelets: 309 10*3/uL (ref 150–400)
RBC: 5.64 MIL/uL (ref 4.22–5.81)
RDW: 17 % — AB (ref 11.5–15.5)
WBC: 8.3 10*3/uL (ref 4.0–10.5)

## 2014-09-30 LAB — BASIC METABOLIC PANEL WITH GFR
BUN: 12 mg/dL (ref 6–23)
CALCIUM: 9.3 mg/dL (ref 8.4–10.5)
CO2: 25 meq/L (ref 19–32)
Chloride: 97 mEq/L (ref 96–112)
Creat: 0.92 mg/dL (ref 0.50–1.35)
GFR, Est African American: >89 mL/min
GFR, Est Non African American: 86 mL/min
Glucose, Bld: 158 mg/dL — ABNORMAL HIGH (ref 70–99)
Potassium: 5.7 mEq/L — ABNORMAL HIGH (ref 3.5–5.3)
Sodium: 131 mEq/L — ABNORMAL LOW (ref 135–145)

## 2014-09-30 LAB — HEPATIC FUNCTION PANEL
ALBUMIN: 3.9 g/dL (ref 3.5–5.2)
ALT: 13 U/L (ref 0–53)
AST: 13 U/L (ref 0–37)
Alkaline Phosphatase: 63 U/L (ref 39–117)
BILIRUBIN INDIRECT: 0.5 mg/dL (ref 0.2–1.2)
Bilirubin, Direct: 0.2 mg/dL (ref 0.0–0.3)
Total Bilirubin: 0.7 mg/dL (ref 0.2–1.2)
Total Protein: 6.6 g/dL (ref 6.0–8.3)

## 2014-09-30 LAB — HEMOGLOBIN A1C
HEMOGLOBIN A1C: 9 % — AB (ref <5.7)
MEAN PLASMA GLUCOSE: 212 mg/dL — AB (ref <117)

## 2014-09-30 LAB — MAGNESIUM: MAGNESIUM: 1.6 mg/dL (ref 1.5–2.5)

## 2014-09-30 LAB — TSH: TSH: 2.603 u[IU]/mL (ref 0.350–4.500)

## 2014-09-30 NOTE — Progress Notes (Signed)
Assessment and Plan:  1. Hypertension -Continue medication, monitor blood pressure at home. Continue DASH diet.  Reminder to go to the ER if any CP, SOB, nausea, dizziness, severe HA, changes vision/speech, left arm numbness and tingling and jaw pain.  2. Cholesterol -Continue diet and exercise. Check cholesterol.   3. Diabetes with other circulatory complications -Continue diet and exercise. Check A1C  4. Vitamin D Def - check level and continue medications.   5. Atherosclerosis of native coronary artery of native heart without angina pectoris Control blood pressure, cholesterol, glucose, increase exercise.  Continue cardio follow up  6. Carotid stenosis, bilateral Control blood pressure, cholesterol, glucose, increase exercise.   Continue diet and meds as discussed. Further disposition pending results of labs. Discussed med's effects and SE's.    Over 30 minutes of exam, counseling, chart review, and critical decision making was performed   HPI 64 y.o. male  presents for 3 month follow up on hypertension, cholesterol, diabetes and vitamin D deficiency.   His blood pressure has been controlled at home, today their BP is BP: 138/82 mmHg  He has started to workout. He denies chest pain, shortness of breath, dizziness.  He has a history of CAD s/p CABG in 2008, follows with Dr. Percival Spanish.  He is on cholesterol medication, welchol 625 mg 3 tabs BID and crestor 20mg . His cholesterol is at goal. The cholesterol last visit was:   Lab Results  Component Value Date   CHOL 102 06/29/2014   HDL 39* 06/29/2014   LDLCALC 50 06/29/2014   TRIG 64 06/29/2014   CHOLHDL 2.6 06/29/2014   He has been working on diet and exercise for diabetes with history of CAD s/p CABG and PAD and p. Neuropathy at time,he is on 81mg  ASA,he is not on ACE/ARB due to hypotension, he is on glimepiride 4mg  once a day at night with biggest meal, metformin 2000 IU daily, and off [place] was added last visit, sugars are  running 140's and denies paresthesia of the feet, polydipsia, polyuria and visual disturbances. Last A1C in the office was:  Lab Results  Component Value Date   HGBA1C 9.7* 06/29/2014   Patient is on Vitamin D supplement., he is on 50,000 every week, may need two a week.    Lab Results  Component Value Date   VD25OH 27* 06/29/2014   Complains of right hand numbness at 4th and 5th digit for past week, no pain, good grip.  He follows with Dr. Berenice Primas for knee pain.  He was started on iron last time for iron def.  Lab Results  Component Value Date   IRON 72 04/13/2014   TIBC 368 04/13/2014   FERRITIN 38 04/13/2014   Lab Results  Component Value Date   WBC 7.3 06/29/2014   HGB 11.1* 06/29/2014   HCT 36.7* 06/29/2014   MCV 65.7* 06/29/2014   PLT 268 06/29/2014    Current Medications:  Current Outpatient Prescriptions on File Prior to Visit  Medication Sig Dispense Refill  . aspirin 81 MG tablet Take 81 mg by mouth daily.    [name] atorvastatin (LIPITOR) 80 MG tablet Take 1 tablet (80 mg total) by mouth daily. 30 tablet 3  . ergocalciferol (VITAMIN D2) 50000 UNITS capsule Take 1 capsule (50,000 Units total) by mouth once a week. 4 capsule 3  . glimepiride (AMARYL) 4 MG tablet TAKE 1 TABLET BY MOUTH TWICE A DAY 60 tablet 5  . lidocaine (XYLOCAINE) 2 % solution Use as directed 20 mLs in  the mouth or throat as needed for mouth pain. 100 mL 0  . meloxicam (MOBIC) 15 MG tablet     . metFORMIN (GLUCOPHAGE XR) 500 MG 24 hr tablet Tke 1 to 2 tablets with food 2 x daily as directed for Diabetes 120 tablet 5  . metoprolol tartrate (LOPRESSOR) 25 MG tablet 1 tablet twice daily 180 tablet 3  . Multiple Vitamin (MULTIVITAMIN) tablet Take 1 tablet by mouth daily.    . sitaGLIPtin (JANUVIA) 100 MG tablet Take 1 tablet (100 mg total) by mouth daily. 30 tablet 3  . WELCHOL 625 MG tablet TAKE 3 TABLETS BY MOUTH TWICE A DAY 180 tablet 5   No current facility-administered medications on file prior to  visit.   Medical History:  Past Medical History  Diagnosis Date  . Coronary artery disease     s/p CABG in 2008  . Carotid artery stenosis     60 %-80 %  . Dyslipidemia   . Anemia   . Sinus pain   . Syncope   . Diabetes mellitus type II   . Hypertension   . Hyperlipidemia   . Vitamin D deficiency    Allergies:  Allergies  Allergen Reactions  . Statins Other (See Comments)    Elevated LFT's    Review of Systems:  Review of Systems  Constitutional: Negative.   HENT: Negative.   Eyes: Negative.   Respiratory: Negative.   Cardiovascular: Negative.   Gastrointestinal: Negative.   Genitourinary: Negative.   Musculoskeletal: Negative.   Skin: Negative.   Neurological: Negative.   Endo/Heme/Allergies: Negative.   Psychiatric/Behavioral: Negative.     Family history- Review and unchanged Social history- Review and unchanged Physical Exam: BP 138/82 mmHg  Pulse 68  Temp(Src) 98.1 F (36.7 C)  Resp 16  Ht 5' 1.5" (1.562 m) Wt Readings from Last 3 Encounters:  06/29/14 147 lb (66.679 kg)  05/19/14 143 lb (64.864 kg)  04/13/14 149 lb (67.586 kg)   General Appearance: Well nourished, in no apparent distress. Eyes: PERRLA, EOMs, conjunctiva no swelling or erythema Sinuses: No Frontal/maxillary tenderness ENT/Mouth: Ext aud canals clear, TMs without erythema, bulging. No erythema, swelling, or exudate on post pharynx.  Tonsils not swollen or erythematous. Hearing normal.  Neck: Supple, thyroid normal.  Respiratory: Respiratory effort normal, BS equal bilaterally without rales, rhonchi, wheezing or stridor.  Cardio: RRR with no MRGs. Brisk peripheral pulses without edema.  Abdomen: Soft, + BS.  Non tender, no guarding, rebound, hernias, masses. Lymphatics: Non tender without lymphadenopathy.  Musculoskeletal: Full ROM, 5/5 strength, Normal gait Skin: Warm, dry without rashes, lesions, ecchymosis.  Neuro: Cranial nerves intact. No cerebellar symptoms.  Psych: Awake and  oriented X 3, normal affect, Insight and Judgment appropriate.    [name], PA-C 8:52 AM [facility]

## 2014-09-30 NOTE — Addendum Note (Signed)
Addended by: [name] R on: 09/30/2014 09:09 AM   Modules accepted: Orders

## 2014-09-30 NOTE — Patient Instructions (Addendum)
CALL YOUR INSURANCE OR LOOK AT YOUR BOOK TO SEE WHAT DIABETIC MEDICATION IS COVERED  NEED TO GET STRICT WITH YOUR DIET, WITH A1C YOU ARE AT RISK FOR STROKE AND HEART ATTACK  Continue to take 1 of the glimepiride with dinner, but if in the morning your sugar is over 150 take 1/2 of the glimepiride.   Depending on your vitamin D we may increase to two 50,000 a week.   Vitamin D goal is between 60-80  Please make sure that you are taking your Vitamin D as directed.   It is very important as a natural anti-inflammatory   helping hair, skin, and nails, as well as reducing stroke and heart attack risk.   It helps your bones and helps with mood.  It also decreases numerous cancer risks so please take it as directed.   Low Vit D is associated with a 200-300% higher risk for CANCER   and 200-300% higher risk for HEART   ATTACK  &  STROKE.    .....................................[name]  It is also associated with higher death rate at younger ages,   autoimmune diseases like Rheumatoid arthritis, Lupus, Multiple Sclerosis.     Also many other serious conditions, like depression, Alzheimer's  Dementia, infertility, muscle aches, fatigue, fibromyalgia - just to name a few.  +++++++++++++++++++   Diabetes is a very complicated disease...lets simplify it.  An easy way to look at it to understand the complications is if you think of the extra sugar floating in your blood stream as glass shards floating through your blood stream.    Diabetes affects your small vessels first: 1) The glass shards (sugar) scraps down the tiny blood vessels in your eyes and lead to diabetic retinopathy, the leading cause of blindness in the [place]. Diabetes is the leading cause of newly diagnosed adult (53 to 66 years of age) blindness in the [place].  2) The glass shards scratches down the tiny vessels of your legs leading to nerve damage called neuropathy and can lead to amputations of your feet. More than 60%  of all non-traumatic amputations of lower limbs occur in people with diabetes.  3) Over time the small vessels in your brain are shredded and closed off, individually this does not cause any problems but over a long period of time many of the small vessels being blocked can lead to Vascular Dementia.   4) Your kidney's are a filter system and have a "net" that keeps certain things in the body and lets bad things out. Sugar shreds this net and leads to kidney damage and eventually failure. Decreasing the sugar that is destroying the net and certain blood pressure medications can help stop or decrease progression of kidney disease. Diabetes was the primary cause of kidney failure in 44 percent of all new cases in 2011.  5) Diabetes also destroys the small vessels in your penis that lead to erectile dysfunction. Eventually the vessels are so damaged that you may not be responsive to cialis or viagra.   Diabetes and your large vessels: Your larger vessels consist of your coronary arteries in your heart and the carotid vessels to your brain. Diabetes or even increased sugars put you at 300% increased risk of heart attack and stroke and this is why.. The sugar scrapes down your large blood vessels and your body sees this as an internal injury and tries to repair itself. Just like you get a scab on your skin, your platelets will stick to the blood  vessel wall trying to heal it. This is why we have diabetics on low dose aspirin daily, this prevents the platelets from sticking and can prevent plaque formation. In addition, your body takes cholesterol and tries to shove it into the open wound. This is why we want your LDL, or bad cholesterol, below 70.   The combination of platelets and cholesterol over 5-10 years forms plaque that can break off and cause a heart attack or stroke.   PLEASE REMEMBER:  Diabetes is preventable! Up to 48 percent of complications and morbidities among individuals with type 2  diabetes can be prevented, delayed, or effectively treated and minimized with regular visits to a health professional, appropriate monitoring and medication, and a healthy diet and lifestyle.  Recommendations For Diabetic/Prediabetic Patients:   -  Take medications as prescribed  -  Recommend Dr Fara Olden Fuhrman's book "The End of Diabetes "  And "The End of Dieting"- Can get at  www.[employer].com and encourage also get the Audio CD book  - AVOID Animal products, ie. Meat - red/white, Poultry and Dairy/especially cheese - Exercise at least 5 times a week for 30 minutes or preferably daily.  - No Smoking - Drink less than 2 drinks a day.  - Monitor your feet for sores - Have yearly Eye Exams - Recommend annual Flu vaccine  - Recommend Pneumovax and Prevnar vaccines - Shingles Vaccine (Zostavax) if over 1 y.o.  Goals:   - BMI less than 24 - Fasting sugar less than 130 or less than 150 if tapering medicines to lose weight  - Systolic BP less than 315  - Diastolic BP less than 80 - Bad LDL Cholesterol less than 70 - Triglycerides less than 150

## 2014-10-01 ENCOUNTER — Other Ambulatory Visit: Payer: Self-pay | Admitting: Internal Medicine

## 2014-10-01 LAB — INSULIN, FASTING: Insulin fasting, serum: 4 u[IU]/mL (ref 2.0–19.6)

## 2014-10-01 LAB — VITAMIN D 25 HYDROXY (VIT D DEFICIENCY, FRACTURES): VIT D 25 HYDROXY: 64 ng/mL (ref 30–100)

## 2014-10-18 ENCOUNTER — Inpatient Hospital Stay (HOSPITAL_COMMUNITY): Admission: RE | Admit: 2014-10-18 | Payer: Medicare Other | Source: Ambulatory Visit

## 2014-10-19 ENCOUNTER — Other Ambulatory Visit: Payer: Self-pay | Admitting: Physician Assistant

## 2014-11-09 ENCOUNTER — Ambulatory Visit: Payer: Self-pay | Admitting: Physician Assistant

## 2014-12-26 ENCOUNTER — Telehealth: Payer: Self-pay | Admitting: *Deleted

## 2014-12-26 DIAGNOSIS — I25119 Atherosclerotic heart disease of native coronary artery with unspecified angina pectoris: Secondary | ICD-10-CM

## 2014-12-26 MED ORDER — METOPROLOL TARTRATE 25 MG PO TABS: 25.0000 mg | ORAL_TABLET | Freq: Two times a day (BID) | ORAL | Status: DC

## 2014-12-26 NOTE — Telephone Encounter (Signed)
Walk-in request for refills. Refill submitted to patient's preferred pharmacy. Informed patient. Pt voiced understanding, no other stated concerns at this time.

## 2014-12-26 NOTE — Telephone Encounter (Signed)
Resent refill

## 2015-01-28 ENCOUNTER — Other Ambulatory Visit: Payer: Self-pay | Admitting: Internal Medicine

## 2015-01-30 ENCOUNTER — Other Ambulatory Visit: Payer: Self-pay

## 2015-01-30 MED ORDER — GLIMEPIRIDE 4 MG PO TABS: 4.0000 mg | ORAL_TABLET | Freq: Two times a day (BID) | ORAL | Status: DC

## 2015-02-07 ENCOUNTER — Other Ambulatory Visit: Payer: Self-pay | Admitting: Internal Medicine

## 2015-02-07 ENCOUNTER — Ambulatory Visit (INDEPENDENT_AMBULATORY_CARE_PROVIDER_SITE_OTHER): Payer: Medicare Other | Admitting: Physician Assistant

## 2015-02-07 ENCOUNTER — Encounter: Payer: Self-pay | Admitting: Physician Assistant

## 2015-02-07 VITALS — BP 136/72 | HR 96 | Temp 98.1°F | Resp 16 | Ht 61.5 in | Wt 149.0 lb

## 2015-02-07 DIAGNOSIS — E559 Vitamin D deficiency, unspecified: Secondary | ICD-10-CM

## 2015-02-07 DIAGNOSIS — I739 Peripheral vascular disease, unspecified: Secondary | ICD-10-CM

## 2015-02-07 DIAGNOSIS — N138 Other obstructive and reflux uropathy: Secondary | ICD-10-CM

## 2015-02-07 DIAGNOSIS — N401 Enlarged prostate with lower urinary tract symptoms: Secondary | ICD-10-CM | POA: Diagnosis not present

## 2015-02-07 DIAGNOSIS — D649 Anemia, unspecified: Secondary | ICD-10-CM

## 2015-02-07 DIAGNOSIS — I2510 Atherosclerotic heart disease of native coronary artery without angina pectoris: Secondary | ICD-10-CM

## 2015-02-07 DIAGNOSIS — Z1159 Encounter for screening for other viral diseases: Secondary | ICD-10-CM

## 2015-02-07 DIAGNOSIS — Z23 Encounter for immunization: Secondary | ICD-10-CM | POA: Diagnosis not present

## 2015-02-07 DIAGNOSIS — E782 Mixed hyperlipidemia: Secondary | ICD-10-CM

## 2015-02-07 DIAGNOSIS — I10 Essential (primary) hypertension: Secondary | ICD-10-CM

## 2015-02-07 DIAGNOSIS — E1159 Type 2 diabetes mellitus with other circulatory complications: Secondary | ICD-10-CM

## 2015-02-07 DIAGNOSIS — I6523 Occlusion and stenosis of bilateral carotid arteries: Secondary | ICD-10-CM

## 2015-02-07 DIAGNOSIS — Z79899 Other long term (current) drug therapy: Secondary | ICD-10-CM

## 2015-02-07 DIAGNOSIS — Z0001 Encounter for general adult medical examination with abnormal findings: Secondary | ICD-10-CM

## 2015-02-07 LAB — HEPATIC FUNCTION PANEL
ALK PHOS: 59 U/L (ref 40–115)
ALT: 15 U/L (ref 9–46)
AST: 14 U/L (ref 10–35)
Albumin: 3.9 g/dL (ref 3.6–5.1)
BILIRUBIN INDIRECT: 0.4 mg/dL (ref 0.2–1.2)
BILIRUBIN TOTAL: 0.5 mg/dL (ref 0.2–1.2)
Bilirubin, Direct: 0.1 mg/dL (ref <=0.2)
Total Protein: 6.4 g/dL (ref 6.1–8.1)

## 2015-02-07 LAB — BASIC METABOLIC PANEL WITH GFR
BUN: 16 mg/dL (ref 7–25)
CO2: 26 mmol/L (ref 20–31)
Calcium: 9.3 mg/dL (ref 8.6–10.3)
Chloride: 100 mmol/L (ref 98–110)
Creat: 0.91 mg/dL (ref 0.70–1.25)
GFR, EST NON AFRICAN AMERICAN: 88 mL/min (ref >=60)
Glucose, Bld: 186 mg/dL — ABNORMAL HIGH (ref 65–99)
POTASSIUM: 4.8 mmol/L (ref 3.5–5.3)
SODIUM: 132 mmol/L — AB (ref 135–146)

## 2015-02-07 LAB — HEMOGLOBIN A1C
Hgb A1c MFr Bld: 8.1 % — ABNORMAL HIGH (ref <5.7)
Mean Plasma Glucose: 186 mg/dL — ABNORMAL HIGH (ref <117)

## 2015-02-07 LAB — CBC WITH DIFFERENTIAL/PLATELET
BASOS ABS: 0.1 10*3/uL (ref 0.0–0.1)
Basophils Relative: 1 % (ref 0–1)
EOS ABS: 0.6 10*3/uL (ref 0.0–0.7)
EOS PCT: 8 % — AB (ref 0–5)
HCT: 36.3 % — ABNORMAL LOW (ref 39.0–52.0)
Hemoglobin: 11.2 g/dL — ABNORMAL LOW (ref 13.0–17.0)
LYMPHS PCT: 25 % (ref 12–46)
Lymphs Abs: 1.9 10*3/uL (ref 0.7–4.0)
MCH: 20 pg — ABNORMAL LOW (ref 26.0–34.0)
MCHC: 30.9 g/dL (ref 30.0–36.0)
MCV: 64.9 fL — ABNORMAL LOW (ref 78.0–100.0)
Monocytes Absolute: 0.7 10*3/uL (ref 0.1–1.0)
Monocytes Relative: 10 % (ref 3–12)
Neutro Abs: 4.1 10*3/uL (ref 1.7–7.7)
Neutrophils Relative %: 56 % (ref 43–77)
PLATELETS: 289 10*3/uL (ref 150–400)
RBC: 5.59 MIL/uL (ref 4.22–5.81)
RDW: 17.1 % — AB (ref 11.5–15.5)
WBC: 7.4 10*3/uL (ref 4.0–10.5)

## 2015-02-07 LAB — LIPID PANEL
Cholesterol: 182 mg/dL (ref 125–200)
HDL: 41 mg/dL (ref >=40)
LDL CALC: 117 mg/dL (ref <130)
TRIGLYCERIDES: 119 mg/dL (ref <150)
Total CHOL/HDL Ratio: 4.4 Ratio (ref <=5.0)
VLDL: 24 mg/dL (ref <30)

## 2015-02-07 LAB — MAGNESIUM: Magnesium: 1.6 mg/dL (ref 1.5–2.5)

## 2015-02-07 NOTE — Progress Notes (Signed)
Complete Physical  Assessment and Plan: 1. Essential hypertension - continue medications, DASH diet, exercise and monitor at home. Call if greater than 130/80. - CBC with Differential/Platelet - BASIC METABOLIC PANEL WITH GFR - Hepatic function panel - TSH - Urinalysis, Routine w reflex microscopic (not at [facility]) - Microalbumin / creatinine urine ratio  2. Atherosclerosis of native coronary artery of native heart without angina pectoris Control blood pressure, cholesterol, glucose, increase exercise.  Continue cardio follow up  3. Carotid stenosis, bilateral Control blood pressure, cholesterol, glucose, increase exercise.   4. Type 2 diabetes mellitus with other circulatory complication [facility]) Discussed general issues about diabetes pathophysiology and management., Educational material distributed., Suggested low cholesterol diet., Encouraged aerobic exercise., Discussed foot care., Reminded to get yearly retinal exam. - Hemoglobin A1c  5. Hyperlipidemia -continue medications, check lipids, decrease fatty foods, increase activity.  - Lipid panel  6. Anemia, unspecified anemia type - monitor, continue iron supp with Vitamin C and increase green leafy veggies  7. Medication management - Magnesium  8. Vitamin D deficiency - VITAMIN D 25 Hydroxy (Vit-D Deficiency, Fractures)  9. Encounter for general adult medical examination with abnormal findings  10. Need for prophylactic vaccination with combined diphtheria-tetanus-pertussis (DTP) vaccine - Dt vaccine greater than 7yo IM  11. Screening for viral disease - Hepatitis C antibody  12. BPH with obstruction/lower urinary tract symptoms - PSA  13. Need for prophylactic vaccination and inoculation against influenza - Flu vaccine HIGH DOSE PF (Fluzone High dose)  14. Claudication ([place]) On bASA, start walking, following with cardiology in Dec, can get ABI there or we can set up later   Discussed med's effects and SE's.  Screening labs and tests as requested with regular follow-up as recommended.  HPI Patient presents for a complete physical.    His blood pressure has been controlled at home, today their BP is BP: 136/72 mmHg He does workout, walks. He denies chest pain, shortness of breath, dizziness.  He has seen Dr. Percival Spanish, has follow up in Dec, and doing well, he had a CABG in 2008, on bASA, and lopressor.  He is on cholesterol medication, he is off crestor and now on welchol due to myalgias. His cholesterol is at goal. The cholesterol last visit was:   Lab Results  Component Value Date   CHOL 100 09/30/2014   HDL 34* 09/30/2014   LDLCALC 40 09/30/2014   TRIG 129 09/30/2014   CHOLHDL 2.9 09/30/2014   He has had diabetes since 2000.  He has been working on diet and exercise for diabetes with CAD, he is on bASA, MF and amaryl, took himself off invokana and is not on ACE/ARB, states will have rare low blood sugars, and denies paresthesia of the feet, polydipsia and polyuria. Last A1C in the office was:  Lab Results  Component Value Date   HGBA1C 9.0* 09/30/2014   Lab Results  Component Value Date   GFRNONAA 86 09/30/2014   Patient is on Vitamin D supplement.   Lab Results  Component Value Date   VD25OH 72 09/30/2014     He has been having bilateral knee pain, has been having steroid injections with Dr. Berenice Primas that have helped and has been able to walk, but he states he wants the surgery for next year.   Current Medications:  Current Outpatient Prescriptions on File Prior to Visit  Medication Sig Dispense Refill  . aspirin 81 MG tablet Take 81 mg by mouth daily.    [name] glimepiride (AMARYL) 4  MG tablet Take 1 tablet (4 mg total) by mouth 2 (two) times daily. 60 tablet 4  . meloxicam (MOBIC) 15 MG tablet     . metFORMIN (GLUCOPHAGE XR) 500 MG 24 hr tablet Tke 1 to 2 tablets with food 2 x daily as directed for Diabetes 120 tablet 5  . metoprolol tartrate (LOPRESSOR) 25 MG tablet Take 1 tablet (25  mg total) by mouth 2 (two) times daily. 60 tablet 2  . Multiple Vitamin (MULTIVITAMIN) tablet Take 1 tablet by mouth daily.    Earnestine Mealing 625 MG tablet TAKE 3 TABLETS BY MOUTH TWICE A DAY 180 tablet 5   No current facility-administered medications on file prior to visit.   Health Maintenance:  Immunization History  Administered Date(s) Administered  . Influenza Whole 12/10/2012  . Influenza-Unspecified 12/31/2013  . Pneumococcal Conjugate-13 02/02/2014  . Pneumococcal Polysaccharide-23 04/13/2003  . Td 04/18/2004   Tetanus:2006 DUE Pneumovax: 2005 Prevnar 13: 2015 Flu vaccine:2016 Zostavax: declines DEXA: declines Colonoscopy: 2014 due 2024 EGD: N/A Dr. Clydene Laming eye doctor - jan 2016 Echo 2009 MRI 2009  Patient Care Team: Unk Pinto, MD as PCP - General (Internal Medicine) Jenel Lucks. Clydene Laming, MD as Consulting Physician (Ophthalmology) Minus Breeding, MD as Consulting Physician (Cardiology) Clarene Essex, MD as Consulting Physician (Gastroenterology) Berenice Primas, MD as Referring Physician (Orthopedic Surgery)   Allergies:  Allergies  Allergen Reactions  . Statins Other (See Comments)    Elevated LFT's   Medical History:  Past Medical History  Diagnosis Date  . Coronary artery disease     s/p CABG in 2008  . Carotid artery stenosis     60 %-80 %  . Dyslipidemia   . Anemia   . Sinus pain   . Syncope   . Diabetes mellitus type II   . Hypertension   . Hyperlipidemia   . Vitamin D deficiency    Surgical History:  Past Surgical History  Procedure Laterality Date  . Coronary artery bypass graft  2008    LIMA to the LAD, saphenous vein graft to diagonal, saphenous vein graft to OM, saphenous vein graft to posterolateral 2008   Family History:  Family History  Problem Relation Age of Onset  . Hypertension Father   . Heart disease Father   . Hyperlipidemia Father   . Heart attack Father   . Diabetes Sister   . Heart disease Brother    Social History:    Social History  Substance Use Topics  . Smoking status: Former Smoker    Quit date: 08/29/1995  . Smokeless tobacco: None  . Alcohol Use: No   Review of Systems  Constitutional: Negative.   HENT: Negative.   Respiratory: Negative.   Cardiovascular: Positive for claudication (He does complain of claudication, right leg worse than left with walking long distances, goes away with stopping. ). Negative for chest pain, palpitations, orthopnea, leg swelling and PND.  Gastrointestinal: Negative.   Genitourinary: Negative.   Musculoskeletal: Positive for joint pain. Negative for myalgias, back pain, falls and neck pain.  Skin: Negative.   Neurological: Negative.   Psychiatric/Behavioral: Negative.      Physical Exam: Estimated body mass index is 27.7 kg/(m^2) as calculated from the following:   Height as of this encounter: 5' 1.5" (1.562 m).   Weight as of this encounter: 149 lb (67.586 kg). BP 136/72 mmHg  Pulse 96  Temp(Src) 98.1 F (36.7 C) (Temporal)  Resp 16  Ht 5' 1.5" (1.562 m)  Wt 149 lb (67.586 kg)  BMI 27.70 kg/m2  SpO2 98% General Appearance: Well nourished, in no apparent distress.  Eyes: PERRLA, EOMs, conjunctiva no swelling or erythema, normal fundi and vessels.  Sinuses: No Frontal/maxillary tenderness  ENT/Mouth: Ext aud canals clear, normal light reflex with TMs without erythema, bulging. Good dentition. No erythema, swelling, or exudate on post pharynx. Tonsils not swollen or erythematous. Hearing normal.  Neck: Supple, thyroid normal. No bruits  Respiratory: Respiratory effort normal, BS equal bilaterally without rales, rhonchi, wheezing or stridor.  Cardio: RRR with systolic murmur RSB rubs or gallops. Decreased hair bilateral legs, decrease TP/DP pulses on right versus left, good bilateral sensation, without edema.  Chest: symmetric, with normal excursions and percussion.  Abdomen: Soft, nontender, no guarding, rebound, hernias, masses, or organomegaly. .   Lymphatics: Non tender without lymphadenopathy.  Genitourinary: defer Musculoskeletal: Full ROM all peripheral extremities,5/5 strength, and normal gait.  Skin: Warm, dry without rashes, lesions, ecchymosis. Neuro: Cranial nerves intact, reflexes equal bilaterally. Normal muscle tone, no cerebellar symptoms. Sensation intact.  Psych: Awake and oriented X 3, normal affect, Insight and Judgment appropriate.   EKG: defer cardio AORTA SCAN: defer  [name] 10:12 AM [facility]

## 2015-02-07 NOTE — Patient Instructions (Addendum)
ACE inhibitors are blood pressure medications that protect your heart and kidneys. Wants to talk with Dr. Percival Spanish before adding it, suggest adding.   It can cause two symptoms: The most common symptom is a dry cough/tickle in your throat that can happen the first day you take it or 5 years after you have been taking it. Please call us if you have this and we can switch it to a different medications. The least common side effect is called angioedema which is swelling of your lips and tongue and can cause problems with your breathing. This is a very very rare side effect but very serious. If this happens please stop the medication and go to the ER.   Diabetes is a very complicated disease...lets simplify it.  An easy way to look at it to understand the complications is if you think of the extra sugar floating in your blood stream as glass shards floating through your blood stream.    Diabetes affects your small vessels first: 1) The glass shards (sugar) scraps down the tiny blood vessels in your eyes and lead to diabetic retinopathy, the leading cause of blindness in the [place]. Diabetes is the leading cause of newly diagnosed adult (65 to 66 years of age) blindness in the [place].  2) The glass shards scratches down the tiny vessels of your legs leading to nerve damage called neuropathy and can lead to amputations of your feet. More than 60% of all non-traumatic amputations of lower limbs occur in people with diabetes.  3) Over time the small vessels in your brain are shredded and closed off, individually this does not cause any problems but over a long period of time many of the small vessels being blocked can lead to Vascular Dementia.   4) Your kidney's are a filter system and have a "net" that keeps certain things in the body and lets bad things out. Sugar shreds this net and leads to kidney damage and eventually failure. Decreasing the sugar that is destroying the net and certain blood  pressure medications can help stop or decrease progression of kidney disease. Diabetes was the primary cause of kidney failure in 44 percent of all new cases in 2011.  5) Diabetes also destroys the small vessels in your penis that lead to erectile dysfunction. Eventually the vessels are so damaged that you may not be responsive to cialis or viagra.   Diabetes and your large vessels: Your larger vessels consist of your coronary arteries in your heart and the carotid vessels to your brain. Diabetes or even increased sugars put you at 300% increased risk of heart attack and stroke and this is why.. The sugar scrapes down your large blood vessels and your body sees this as an internal injury and tries to repair itself. Just like you get a scab on your skin, your platelets will stick to the blood vessel wall trying to heal it. This is why we have diabetics on low dose aspirin daily, this prevents the platelets from sticking and can prevent plaque formation. In addition, your body takes cholesterol and tries to shove it into the open wound. This is why we want your LDL, or bad cholesterol, below 70.   The combination of platelets and cholesterol over 5-10 years forms plaque that can break off and cause a heart attack or stroke.   PLEASE REMEMBER:  Diabetes is preventable! Up to 75 percent of complications and morbidities among individuals with type 2 diabetes can be prevented, delayed,  or effectively treated and minimized with regular visits to a health professional, appropriate monitoring and medication, and a healthy diet and lifestyle.     Bad carbs also include fruit juice, alcohol, and sweet tea. These are empty calories that do not signal to your brain that you are full.   Please remember the good carbs are still carbs which convert into sugar. So please measure them out no more than 1/2-1 cup of rice, oatmeal, pasta, and beans  Veggies are however free foods! Pile them on.   Not all fruit is  created equal. Please see the list below, the fruit at the bottom is higher in sugars than the fruit at the top. Please avoid all dried fruits.     Preventive Care for Adults A healthy lifestyle and preventive care can promote health and wellness. Preventive health guidelines for men include the following key practices:  A routine yearly physical is a good way to check with your health care provider about your health and preventative screening. It is a chance to share any concerns and updates on your health and to receive a thorough exam.  Visit your dentist for a routine exam and preventative care every 6 months. Brush your teeth twice a day and floss once a day. Good oral hygiene prevents tooth decay and gum disease.  The frequency of eye exams is based on your age, health, family medical history, use of contact lenses, and other factors. Follow your health care provider's recommendations for frequency of eye exams.  Eat a healthy diet. Foods such as vegetables, fruits, whole grains, low-fat dairy products, and lean protein foods contain the nutrients you need without too many calories. Decrease your intake of foods high in solid fats, added sugars, and salt. Eat the right amount of calories for you.Get information about a proper diet from your health care provider, if necessary.  Regular physical exercise is one of the most important things you can do for your health. Most adults should get at least 150 minutes of moderate-intensity exercise (any activity that increases your heart rate and causes you to sweat) each week. In addition, most adults need muscle-strengthening exercises on 2 or more days a week.  Maintain a healthy weight. The body mass index (BMI) is a screening tool to identify possible weight problems. It provides an estimate of body fat based on height and weight. Your health care provider can find your BMI and can help you achieve or maintain a healthy weight.For adults 20 years  and older:  A BMI below 18.5 is considered underweight.  A BMI of 18.5 to 24.9 is normal.  A BMI of 25 to 29.9 is considered overweight.  A BMI of 30 and above is considered obese.  Maintain normal blood lipids and cholesterol levels by exercising and minimizing your intake of saturated fat. Eat a balanced diet with plenty of fruit and vegetables. Blood tests for lipids and cholesterol should begin at age 38 and be repeated every 5 years. If your lipid or cholesterol levels are high, you are over 50, or you are at high risk for heart disease, you may need your cholesterol levels checked more frequently.Ongoing high lipid and cholesterol levels should be treated with medicines if diet and exercise are not working.  If you smoke, find out from your health care provider how to quit. If you do not use tobacco, do not start.  Lung cancer screening is recommended for adults aged 28-80 years who are at high risk  for developing lung cancer because of a history of smoking. A yearly low-dose CT scan of the lungs is recommended for people who have at least a 30-pack-year history of smoking and are a current smoker or have quit within the past 15 years. A pack year of smoking is smoking an average of 1 pack of cigarettes a day for 1 year (for example: 1 pack a day for 30 years or 2 packs a day for 15 years). Yearly screening should continue until the smoker has stopped smoking for at least 15 years. Yearly screening should be stopped for people who develop a health problem that would prevent them from having lung cancer treatment.  If you choose to drink alcohol, do not have more than 2 drinks per day. One drink is considered to be 12 ounces (355 mL) of beer, 5 ounces (148 mL) of wine, or 1.5 ounces (44 mL) of liquor.  Avoid use of street drugs. Do not share needles with anyone. Ask for help if you need support or instructions about stopping the use of drugs.  High blood pressure causes heart disease and  increases the risk of stroke. Your blood pressure should be checked at least every 1-2 years. Ongoing high blood pressure should be treated with medicines, if weight loss and exercise are not effective.  If you are 51-27 years old, ask your health care provider if you should take aspirin to prevent heart disease.  Diabetes screening involves taking a blood sample to check your fasting blood sugar level. Testing should be considered at a younger age or be carried out more frequently if you are overweight and have at least 1 risk factor for diabetes.  Colorectal cancer can be detected and often prevented. Most routine colorectal cancer screening begins at the age of 71 and continues through age 79. However, your health care provider may recommend screening at an earlier age if you have risk factors for colon cancer. On a yearly basis, your health care provider may provide home test kits to check for hidden blood in the stool. Use of a small camera at the end of a tube to directly examine the colon (sigmoidoscopy or colonoscopy) can detect the earliest forms of colorectal cancer. Talk to your health care provider about this at age 64, when routine screening begins. Direct exam of the colon should be repeated every 5-10 years through age 76, unless early forms of precancerous polyps or small growths are found.  Hepatitis C blood testing is recommended for all people born from [birth year] through 1965 and any individual with known risks for hepatitis C.  New guidelines recommend a once time screening for HIV.   Screening for abdominal aortic aneurysm (AAA)  by ultrasound is recommended for people who have history of high blood pressure or who are current or former smokers.  Healthy men should  receive prostate-specific antigen (PSA) blood tests as part of routine cancer screening. Talk with your health care provider about prostate cancer screening.  Testicular cancer screening is  recommended for adult males.  Screening includes self-exam, a health care provider exam, and other screening tests. Consult with your health care provider about any symptoms you have or any concerns you have about testicular cancer.  Use sunscreen. Apply sunscreen liberally and repeatedly throughout the day. You should seek shade when your shadow is shorter than you. Protect yourself by wearing long sleeves, pants, a wide-brimmed hat, and sunglasses year round, whenever you are outdoors.  Once a month, do a  whole-body skin exam, using a mirror to look at the skin on your back. Tell your health care provider about new moles, moles that have irregular borders, moles that are larger than a pencil eraser, or moles that have changed in shape or color.  Stay current with required vaccines (immunizations).  Influenza vaccine. All adults should be immunized every year.  Tetanus, diphtheria, and acellular pertussis (Td, Tdap) vaccine. An adult who has not previously received Tdap or who does not know his vaccine status should receive 1 dose of Tdap. This initial dose should be followed by tetanus and diphtheria toxoids (Td) booster doses every 10 years. Adults with an unknown or incomplete history of completing a 3-dose immunization series with Td-containing vaccines should begin or complete a primary immunization series including a Tdap dose. Adults should receive a Td booster every 10 years.  Zoster vaccine. One dose is recommended for adults aged 78 years or older unless certain conditions are present.    PREVNAR - Pneumococcal 13-valent conjugate (PCV13) vaccine. When indicated, a person who is uncertain of his immunization history and has no record of immunization should receive the PCV13 vaccine. An adult aged 56 years or older who has certain medical conditions and has not been previously immunized should receive 1 dose of PCV13 vaccine. This PCV13 should be followed with a dose of pneumococcal polysaccharide (PPSV23) vaccine. The  PPSV23 vaccine dose should be obtained at least 8 weeks after the dose of PCV13 vaccine. An adult aged 18 years or older who has certain medical conditions and previously received 1 or more doses of PPSV23 vaccine should receive 1 dose of PCV13. The PCV13 vaccine dose should be obtained 1 or more years after the last PPSV23 vaccine dose.    PNEUMOVAX - Pneumococcal polysaccharide (PPSV23) vaccine. When PCV13 is also indicated, PCV13 should be obtained first. All adults aged 26 years and older should be immunized. An adult younger than age 41 years who has certain medical conditions should be immunized. Any person who resides in a nursing home or long-term care facility should be immunized. An adult smoker should be immunized. People with an immunocompromised condition and certain other conditions should receive both PCV13 and PPSV23 vaccines. People with human immunodeficiency virus (HIV) infection should be immunized as soon as possible after diagnosis. Immunization during chemotherapy or radiation therapy should be avoided. Routine use of PPSV23 vaccine is not recommended for American Indians, [place]atives, or people younger than 65 years unless there are medical conditions that require PPSV23 vaccine. When indicated, people who have unknown immunization and have no record of immunization should receive PPSV23 vaccine. One-time revaccination 5 years after the first dose of PPSV23 is recommended for people aged 19-64 years who have chronic kidney failure, nephrotic syndrome, asplenia, or immunocompromised conditions. People who received 1-2 doses of PPSV23 before age 56 years should receive another dose of PPSV23 vaccine at age 48 years or later if at least 5 years have passed since the previous dose. Doses of PPSV23 are not needed for people immunized with PPSV23 at or after age 1 years.    Hepatitis A vaccine. Adults who wish to be protected from this disease, have certain high-risk conditions, work  with hepatitis A-infected animals, work in hepatitis A research labs, or travel to or work in countries with a high rate of hepatitis A should be immunized. Adults who were previously unvaccinated and who anticipate close contact with an international adoptee during the first 60 days after arrival in the  [place] from a country with a high rate of hepatitis A should be immunized.    Hepatitis B vaccine. Adults should be immunized if they wish to be protected from this disease, have certain high-risk conditions, may be exposed to blood or other infectious body fluids, are household contacts or sex partners of hepatitis B positive people, are clients or workers in certain care facilities, or travel to or work in countries with a high rate of hepatitis B.   Preventive Service / Frequency   Ages 48 and over  Blood pressure check.  Lipid and cholesterol check.  Lung cancer screening. / Every year if you are aged 43-80 years and have a 30-pack-year history of smoking and currently smoke or have quit within the past 15 years. Yearly screening is stopped once you have quit smoking for at least 15 years or develop a health problem that would prevent you from having lung cancer treatment.  Fecal occult blood test (FOBT) of stool. You may not have to do this test if you get a colonoscopy every 10 years.  Flexible sigmoidoscopy** or colonoscopy.** / Every 5 years for a flexible sigmoidoscopy or every 10 years for a colonoscopy beginning at age 62 and continuing until age 32.  Hepatitis C blood test.** / For all people born from [birth year] through 1967 and any individual with known risks for hepatitis C.  Abdominal aortic aneurysm (AAA) screening./ Screening current or former smokers or have Hypertension.  Skin self-exam. / Monthly.  Influenza vaccine. / Every year.  Tetanus, diphtheria, and acellular pertussis (Tdap/Td) vaccine.** / 1 dose of Td every 10 years.   Zoster vaccine.** / 1 dose for  adults aged 70 years or older.         Pneumococcal 13-valent conjugate (PCV13) vaccine.    Pneumococcal polysaccharide (PPSV23) vaccine.     Hepatitis A vaccine.** / Consult your health care provider.  Hepatitis B vaccine.** / Consult your health care provider. Screening for abdominal aortic aneurysm (AAA)  by ultrasound is recommended for people who have history of high blood pressure or who are current or former smokers.

## 2015-02-08 LAB — URINALYSIS, ROUTINE W REFLEX MICROSCOPIC
Bilirubin Urine: NEGATIVE
GLUCOSE, UA: NEGATIVE
Hgb urine dipstick: NEGATIVE
Ketones, ur: NEGATIVE
LEUKOCYTES UA: NEGATIVE
NITRITE: NEGATIVE
PH: 5 (ref 5.0–8.0)
PROTEIN: NEGATIVE
Specific Gravity, Urine: 1.013 (ref 1.001–1.035)

## 2015-02-08 LAB — MICROALBUMIN / CREATININE URINE RATIO
CREATININE, URINE: 64 mg/dL (ref 20–370)
MICROALB UR: 0.7 mg/dL
MICROALB/CREAT RATIO: 11 ug/mg{creat} (ref <30)

## 2015-02-08 LAB — PSA: PSA: 2.44 ng/mL (ref <=4.00)

## 2015-02-08 LAB — VITAMIN D 25 HYDROXY (VIT D DEFICIENCY, FRACTURES): VIT D 25 HYDROXY: 49 ng/mL (ref 30–100)

## 2015-02-08 LAB — TSH: TSH: 1.961 u[IU]/mL (ref 0.350–4.500)

## 2015-02-08 LAB — HEPATITIS C ANTIBODY: HCV Ab: NEGATIVE

## 2015-02-10 ENCOUNTER — Encounter: Payer: Self-pay | Admitting: Cardiology

## 2015-02-10 ENCOUNTER — Ambulatory Visit (INDEPENDENT_AMBULATORY_CARE_PROVIDER_SITE_OTHER): Payer: Medicare Other | Admitting: Cardiology

## 2015-02-10 VITALS — BP 128/86 | HR 108 | Ht 61.0 in | Wt 150.4 lb

## 2015-02-10 DIAGNOSIS — I10 Essential (primary) hypertension: Secondary | ICD-10-CM

## 2015-02-10 DIAGNOSIS — I2510 Atherosclerotic heart disease of native coronary artery without angina pectoris: Secondary | ICD-10-CM

## 2015-02-10 DIAGNOSIS — I6529 Occlusion and stenosis of unspecified carotid artery: Secondary | ICD-10-CM | POA: Diagnosis not present

## 2015-02-10 MED ORDER — PRAVASTATIN SODIUM 80 MG PO TABS: 80.0000 mg | ORAL_TABLET | Freq: Every evening | ORAL | Status: DC

## 2015-02-10 NOTE — Progress Notes (Signed)
HPI The patient presents for followup of his known coronary disease status post CABG. Since I last saw him he has done well. He denies any chest pressure, neck or arm discomfort. He is walking sometimes.  However, he has knee pain. [name] He denies any new shortness of breath, PND or orthopnea. He has no new palpitations, presyncope or syncope. He's had no weight gain or edema.  He stopped Crestor because of cost and shoulder pain.  I reviewed his lipids which are listed below.  He is retired and has more time for exercise.    Allergies  Allergen Reactions  . Statins Other (See Comments)    Elevated LFT's    Current Outpatient Prescriptions  Medication Sig Dispense Refill  . aspirin 81 MG tablet Take 81 mg by mouth daily.    [name] glimepiride (AMARYL) 4 MG tablet Take 1 tablet (4 mg total) by mouth 2 (two) times daily. 60 tablet 4  . meloxicam (MOBIC) 15 MG tablet     . metoprolol tartrate (LOPRESSOR) 25 MG tablet Take 1 tablet (25 mg total) by mouth 2 (two) times daily. 60 tablet 2  . Multiple Vitamin (MULTIVITAMIN) tablet Take 1 tablet by mouth daily.    Earnestine Mealing 625 MG tablet TAKE 3 TABLETS BY MOUTH TWICE A DAY 180 tablet 5  . metFORMIN (GLUCOPHAGE) 500 MG tablet Take 2 tablets by mouth 2 (two) times daily. Take 2 tabs twice daily     No current facility-administered medications for this visit.    Past Medical History  Diagnosis Date  . Coronary artery disease     s/p CABG in 2008  . Carotid artery stenosis     60 %-80 %  . Dyslipidemia   . Anemia   . Sinus pain   . Syncope   . Diabetes mellitus type II   . Hypertension   . Hyperlipidemia   . Vitamin D deficiency     Past Surgical History  Procedure Laterality Date  . Coronary artery bypass graft  2008    LIMA to the LAD, saphenous vein graft to diagonal, saphenous vein graft to OM, saphenous vein graft to posterolateral 2008    ROS:  As stated in the HPI and negative for all other systems.  PHYSICAL EXAM BP 128/86 mmHg   Pulse 108  Ht 5\' 1"  (1.549 m)  Wt 150 lb 7 oz (68.238 kg)  BMI 28.44 kg/m2 GENERAL:  Well appearing NECK:  No jugular venous distention, waveform within normal limits, carotid upstroke brisk and symmetric, no bruits, no thyromegaly LUNGS:  Clear to auscultation bilaterally BACK:  No CVA tenderness CHEST:  Well healed sternotomy scar. HEART:  PMI not displaced or sustained,S1 and S2 within normal limits, no S3, no S4, no clicks, no rubs, no murmurs ABD:  Flat, positive bowel sounds normal in frequency in pitch, no bruits, no rebound, no guarding, no midline pulsatile mass, no hepatomegaly, no splenomegaly EXT:  2 plus pulses throughout, no edema, no cyanosis no clubbing  EKG: Sinus rhythm, rate 108, axis within normal limits, intervals within normal limits, old inferior posterior infarct, inferolateral T wave inversions unchanged from previous, PACs.  02/10/2015  ASSESSMENT AND PLAN  CAD:  The patient has no new sypmtoms.  No further cardiovascular testing is indicated.  We will continue with aggressive risk reduction and meds as listed.  I would like to do a screening stress test when his knees are feeling better.   HTN:  BP is OK but heart  rate is increased.  However, he didn't take his beta blocker this morning.  He will continue current meds.  DYSLIPIDEMIA:  His lipid profile done recently was not at target but he was not taking Crestor secondary to cost and shoulder pain.  Lab Results  Component Value Date   CHOL 182 02/07/2015   TRIG 119 02/07/2015   HDL 41 02/07/2015   LDLCALC 117 02/07/2015    DM:  His A1C was 8.1. I will defer to Alesia Richards, MD   CAROTID STENOSIS:  He is overdue for follow up and we will arrange this.  He had right 50 - 69% stenosis in 2015.

## 2015-02-10 NOTE — Patient Instructions (Addendum)
Your physician wants you to follow-up in: 1 Year. You will receive a reminder letter in the mail two months in advance. If you don't receive a letter, please call our office to schedule the follow-up appointment.  Your physician has requested that you have a carotid duplex. This test is an ultrasound of the carotid arteries in your neck. It looks at blood flow through these arteries that supply the brain with blood. Allow one hour for this exam. There are no restrictions or special instructions.  Your physician has recommended you make the following change in your medication: STOP Welchol and START Pravastatin 80 mg daily  If you need a refill on your cardiac medications before your next appointment, please call your pharmacy.  Merry Christmas and Happy New Year!!

## 2015-02-14 ENCOUNTER — Other Ambulatory Visit: Payer: Self-pay | Admitting: Internal Medicine

## 2015-02-22 ENCOUNTER — Other Ambulatory Visit: Payer: Self-pay | Admitting: *Deleted

## 2015-02-22 MED ORDER — METFORMIN HCL 500 MG PO TABS: 1000.0000 mg | ORAL_TABLET | Freq: Two times a day (BID) | ORAL | Status: DC

## 2015-02-27 ENCOUNTER — Ambulatory Visit (HOSPITAL_COMMUNITY)
Admission: RE | Admit: 2015-02-27 | Discharge: 2015-02-27 | Disposition: A | Discharge status: Home / Self Care | Payer: Medicare Other | Source: Ambulatory Visit | Attending: Internal Medicine | Admitting: Internal Medicine

## 2015-02-27 DIAGNOSIS — E119 Type 2 diabetes mellitus without complications: Secondary | ICD-10-CM | POA: Insufficient documentation

## 2015-02-27 DIAGNOSIS — I6523 Occlusion and stenosis of bilateral carotid arteries: Secondary | ICD-10-CM | POA: Diagnosis not present

## 2015-02-27 DIAGNOSIS — I10 Essential (primary) hypertension: Secondary | ICD-10-CM | POA: Diagnosis not present

## 2015-02-27 DIAGNOSIS — E785 Hyperlipidemia, unspecified: Secondary | ICD-10-CM | POA: Insufficient documentation

## 2015-02-27 DIAGNOSIS — I6529 Occlusion and stenosis of unspecified carotid artery: Secondary | ICD-10-CM | POA: Diagnosis not present

## 2015-03-20 ENCOUNTER — Other Ambulatory Visit: Payer: Self-pay | Admitting: Cardiology

## 2015-03-20 NOTE — Telephone Encounter (Signed)
Rx(s) sent to pharmacy electronically.  

## 2015-05-19 ENCOUNTER — Encounter: Payer: Self-pay | Admitting: Physician Assistant

## 2015-05-19 ENCOUNTER — Ambulatory Visit (INDEPENDENT_AMBULATORY_CARE_PROVIDER_SITE_OTHER): Payer: Medicare Other | Admitting: Physician Assistant

## 2015-05-19 VITALS — BP 138/86 | HR 70 | Temp 97.9°F | Resp 16 | Ht 61.0 in | Wt 155.0 lb

## 2015-05-19 DIAGNOSIS — E1159 Type 2 diabetes mellitus with other circulatory complications: Secondary | ICD-10-CM

## 2015-05-19 DIAGNOSIS — E1142 Type 2 diabetes mellitus with diabetic polyneuropathy: Secondary | ICD-10-CM | POA: Diagnosis not present

## 2015-05-19 DIAGNOSIS — I10 Essential (primary) hypertension: Secondary | ICD-10-CM | POA: Diagnosis not present

## 2015-05-19 DIAGNOSIS — E782 Mixed hyperlipidemia: Secondary | ICD-10-CM | POA: Diagnosis not present

## 2015-05-19 DIAGNOSIS — Z79899 Other long term (current) drug therapy: Secondary | ICD-10-CM

## 2015-05-19 DIAGNOSIS — Z0000 Encounter for general adult medical examination without abnormal findings: Secondary | ICD-10-CM

## 2015-05-19 DIAGNOSIS — R6889 Other general symptoms and signs: Secondary | ICD-10-CM

## 2015-05-19 DIAGNOSIS — I2510 Atherosclerotic heart disease of native coronary artery without angina pectoris: Secondary | ICD-10-CM | POA: Diagnosis not present

## 2015-05-19 DIAGNOSIS — Z0001 Encounter for general adult medical examination with abnormal findings: Secondary | ICD-10-CM | POA: Diagnosis not present

## 2015-05-19 DIAGNOSIS — E559 Vitamin D deficiency, unspecified: Secondary | ICD-10-CM | POA: Diagnosis not present

## 2015-05-19 DIAGNOSIS — E1140 Type 2 diabetes mellitus with diabetic neuropathy, unspecified: Secondary | ICD-10-CM | POA: Insufficient documentation

## 2015-05-19 DIAGNOSIS — D649 Anemia, unspecified: Secondary | ICD-10-CM

## 2015-05-19 DIAGNOSIS — I6529 Occlusion and stenosis of unspecified carotid artery: Secondary | ICD-10-CM

## 2015-05-19 LAB — LIPID PANEL
Cholesterol: 156 mg/dL (ref 125–200)
HDL: 34 mg/dL — AB (ref >=40)
LDL CALC: 85 mg/dL (ref <130)
TRIGLYCERIDES: 187 mg/dL — AB (ref <150)
Total CHOL/HDL Ratio: 4.6 Ratio (ref <=5.0)
VLDL: 37 mg/dL — ABNORMAL HIGH (ref <30)

## 2015-05-19 LAB — CBC WITH DIFFERENTIAL/PLATELET
BASOS ABS: 0.1 10*3/uL (ref 0.0–0.1)
BASOS PCT: 1 % (ref 0–1)
EOS ABS: 0.6 10*3/uL (ref 0.0–0.7)
EOS PCT: 7 % — AB (ref 0–5)
HCT: 36.3 % — ABNORMAL LOW (ref 39.0–52.0)
Hemoglobin: 11.5 g/dL — ABNORMAL LOW (ref 13.0–17.0)
Lymphocytes Relative: 25 % (ref 12–46)
Lymphs Abs: 2.1 10*3/uL (ref 0.7–4.0)
MCH: 20 pg — ABNORMAL LOW (ref 26.0–34.0)
MCHC: 31.7 g/dL (ref 30.0–36.0)
MCV: 63.1 fL — ABNORMAL LOW (ref 78.0–100.0)
Monocytes Absolute: 0.8 10*3/uL (ref 0.1–1.0)
Monocytes Relative: 10 % (ref 3–12)
Neutro Abs: 4.7 10*3/uL (ref 1.7–7.7)
Neutrophils Relative %: 57 % (ref 43–77)
PLATELETS: 290 10*3/uL (ref 150–400)
RBC: 5.75 MIL/uL (ref 4.22–5.81)
RDW: 16.4 % — AB (ref 11.5–15.5)
WBC: 8.2 10*3/uL (ref 4.0–10.5)

## 2015-05-19 LAB — TSH: TSH: 2.39 mIU/L (ref 0.40–4.50)

## 2015-05-19 LAB — BASIC METABOLIC PANEL WITH GFR
BUN: 14 mg/dL (ref 7–25)
CO2: 23 mmol/L (ref 20–31)
Calcium: 9.1 mg/dL (ref 8.6–10.3)
Chloride: 101 mmol/L (ref 98–110)
Creat: 0.98 mg/dL (ref 0.70–1.25)
GFR, EST NON AFRICAN AMERICAN: 80 mL/min (ref >=60)
Glucose, Bld: 150 mg/dL — ABNORMAL HIGH (ref 65–99)
POTASSIUM: 5.3 mmol/L (ref 3.5–5.3)
SODIUM: 134 mmol/L — AB (ref 135–146)

## 2015-05-19 LAB — VITAMIN D 25 HYDROXY (VIT D DEFICIENCY, FRACTURES): VIT D 25 HYDROXY: 37 ng/mL (ref 30–100)

## 2015-05-19 LAB — HEPATIC FUNCTION PANEL
ALBUMIN: 3.9 g/dL (ref 3.6–5.1)
ALK PHOS: 50 U/L (ref 40–115)
ALT: 14 U/L (ref 9–46)
AST: 14 U/L (ref 10–35)
BILIRUBIN INDIRECT: 0.3 mg/dL (ref 0.2–1.2)
BILIRUBIN TOTAL: 0.4 mg/dL (ref 0.2–1.2)
Bilirubin, Direct: 0.1 mg/dL (ref <=0.2)
Total Protein: 6.4 g/dL (ref 6.1–8.1)

## 2015-05-19 LAB — HEMOGLOBIN A1C
Hgb A1c MFr Bld: 9.3 % — ABNORMAL HIGH (ref <5.7)
Mean Plasma Glucose: 220 mg/dL — ABNORMAL HIGH (ref <117)

## 2015-05-19 LAB — MAGNESIUM: Magnesium: 1.6 mg/dL (ref 1.5–2.5)

## 2015-05-19 MED ORDER — ENALAPRIL MALEATE 10 MG PO TABS: 10.0000 mg | ORAL_TABLET | Freq: Every day | ORAL | Status: DC

## 2015-05-19 NOTE — Patient Instructions (Addendum)
ACE inhibitors are blood pressure medications that protect your heart and kidneys. It can cause two symptoms: The most common symptom is a dry cough/tickle in your throat that can happen the first day you take it or 5 years after you have been taking it. Please call us if you have this and we can switch it to a different medications. The least common side effect is called angioedema which is swelling of your lips and tongue and can cause problems with your breathing. This is a very very rare side effect but very serious. If this happens please stop the medication and go to the ER.    Will check cholesterol and possibly start crestor pending labs May start another oral medication VERSUS insuling pending A1C VERY important to have strict diet  Diabetes is a very complicated disease...lets simplify it.  An easy way to look at it to understand the complications is if you think of the extra sugar floating in your blood stream as glass shards floating through your blood stream.    Diabetes affects your small vessels first: 1) The glass shards (sugar) scraps down the tiny blood vessels in your eyes and lead to diabetic retinopathy, the leading cause of blindness in the [place]. Diabetes is the leading cause of newly diagnosed adult (62 to 67 years of age) blindness in the [place].  2) The glass shards scratches down the tiny vessels of your legs leading to nerve damage called neuropathy and can lead to amputations of your feet. More than 60% of all non-traumatic amputations of lower limbs occur in people with diabetes.  3) Over time the small vessels in your brain are shredded and closed off, individually this does not cause any problems but over a long period of time many of the small vessels being blocked can lead to Vascular Dementia.   4) Your kidney's are a filter system and have a "net" that keeps certain things in the body and lets bad things out. Sugar shreds this net and leads to kidney  damage and eventually failure. Decreasing the sugar that is destroying the net and certain blood pressure medications can help stop or decrease progression of kidney disease. Diabetes was the primary cause of kidney failure in 44 percent of all new cases in 2011.  5) Diabetes also destroys the small vessels in your penis that lead to erectile dysfunction. Eventually the vessels are so damaged that you may not be responsive to cialis or viagra.   Diabetes and your large vessels: Your larger vessels consist of your coronary arteries in your heart and the carotid vessels to your brain. Diabetes or even increased sugars put you at 300% increased risk of heart attack and stroke and this is why.. The sugar scrapes down your large blood vessels and your body sees this as an internal injury and tries to repair itself. Just like you get a scab on your skin, your platelets will stick to the blood vessel wall trying to heal it. This is why we have diabetics on low dose aspirin daily, this prevents the platelets from sticking and can prevent plaque formation. In addition, your body takes cholesterol and tries to shove it into the open wound. This is why we want your LDL, or bad cholesterol, below 70.   The combination of platelets and cholesterol over 5-10 years forms plaque that can break off and cause a heart attack or stroke.   PLEASE REMEMBER:  Diabetes is preventable! Up to 85 percent of  complications and morbidities among individuals with type 2 diabetes can be prevented, delayed, or effectively treated and minimized with regular visits to a health professional, appropriate monitoring and medication, and a healthy diet and lifestyle.     Bad carbs also include fruit juice, alcohol, and sweet tea. These are empty calories that do not signal to your brain that you are full.   Please remember the good carbs are still carbs which convert into sugar. So please measure them out no more than 1/2-1 cup of rice,  oatmeal, pasta, and beans  Veggies are however free foods! Pile them on.   Not all fruit is created equal. Please see the list below, the fruit at the bottom is higher in sugars than the fruit at the top. Please avoid all dried fruits.     Here is some information to help you keep your heart healthy: Move it! - Aim for 30 mins of activity every day. Take it slowly at first. Talk to [place] before starting any new exercise program.   Lose it.  -Body Mass Index (BMI) can indicate if you need to lose weight. A healthy range is 18.5-24.9. For a BMI calculator, go to [email or web address]  Waist Management -Excess abdominal fat is a risk factor for heart disease, diabetes, asthma, stroke and more. Ideal waist circumference is less than 35" for women and less than 40" for men.   Eat Right -focus on fruits, vegetables, whole grains, and meals you make yourself. Avoid foods with trans fat and high sugar/sodium content.   Snooze or Snore? - Loud snoring can be a sign of sleep apnea, a significant risk factor for high blood pressure, heart attach, stroke, and heart arrhythmias.  Kick the habit -Quit Smoking! Avoid second hand smoke. A single cigarette raises your blood pressure for 20 mins and increases the risk of heart attack and stroke for the next 24 hours.   Are Aspirin and Supplements right for you? -Add ENTERIC COATED low dose 81 mg Aspirin daily OR can do every other day if you have easy bruising to protect your heart and head. As well as to reduce risk of Colon Cancer by 20 %, Skin Cancer by 26 % , Melanoma by 46% and Pancreatic cancer by 60%  Say "No to Stress -There may be little you can do about problems that cause stress. However, techniques such as long walks, meditation, and exercise can help you manage it.   Start Now! - Make changes one at a time and set reasonable goals to increase your likelihood of success.

## 2015-05-19 NOTE — Progress Notes (Signed)
MEDICARE ANNUAL WELLNESS VISIT AND FOLLOW UP Assessment:   1. Atherosclerosis of native coronary artery of native heart without angina pectoris Control blood pressure, cholesterol, glucose, increase exercise.  Continue cardio follow up - could not tolerate pravastatin due to myalgias,  willing to see the cost of crestor and restart pending cholesterol today.   2. Carotid stenosis, bilateral Control blood pressure, cholesterol, glucose, increase exercise.   3. Essential hypertension - continue medications, DASH diet, exercise and monitor at home. Call if greater than 130/80.  - start on ACE - CBC with Differential/Platelet - BASIC METABOLIC PANEL WITH GFR - Hepatic function panel  4. Type 2 diabetes mellitus with other circulatory complications Discussed general issues about diabetes pathophysiology and management., Educational material distributed., Suggested low cholesterol diet., Encouraged aerobic exercise., Discussed foot care., Reminded to get yearly retinal exam. Discussed oral versus insulin LONG discussion about disease process and comorbidities, has better insight.  - added ACE - [facility] DIABETES FOOT EXAM  5. Hyperlipidemia -continue medications, check lipids, decrease fatty foods, increase activity.  Goal <70  6. Anemia, unspecified anemia type Check CBC  7. Vitamin D deficiency Continue supplement   Plan:   During the course of the visit the patient was educated and counseled about appropriate screening and preventive services including:    Pneumococcal vaccine   Influenza vaccine  Td vaccine  Screening electrocardiogram  Colorectal cancer screening  Diabetes screening  Glaucoma screening  Nutrition counseling   Conditions/risks identified: BMI: Discussed weight loss, diet, and increase physical activity.  Increase physical activity: AHA recommends 150 minutes of physical activity a week.  Medications reviewed Diabetes is not at goal, ACE/ARB  therapy: No, Reason not on Ace Inhibitor/ARB therapy:  hypotension Urinary Incontinence is not an issue: discussed non pharmacology and pharmacology options.  Fall risk: low- discussed PT, home fall assessment, medications.    Subjective:  William Haney is a 65 y.o. male who presents for Medicare Annual Wellness Visit and 3 month follow up for HTN, hyperlipidemia, diabetes, and vitamin D Def.  Date of last medicare wellness visit was 04/2014.  His blood pressure has been controlled at home, today their BP is BP: 138/86 mmHg He does not workout. He denies chest pain, shortness of breath, dizziness.  He has a history of CAD s/p CABG in 2008, follows with Dr. Percival Spanish, saw him in Dec, he was started on pravastatin due to cost of crestor but states he had severe back/shoulder pain with this and stopped it. His pain is better since off the medication. [name]  He is on cholesterol medication, welchol. .His cholesterol is at goal. The cholesterol last visit was:   Lab Results  Component Value Date   CHOL 182 02/07/2015   HDL 41 02/07/2015   LDLCALC 117 02/07/2015   TRIG 119 02/07/2015   CHOLHDL 4.4 02/07/2015   He has been working on diet and exercise for diabetes with history of CAD/PAD ,he is on 81mg  ASA,he is not on ACE/ARB, he is on glimeperide 4mg  once a day, metformin, and he is not on januvia, sugars are running 140's and admits to some paresthesia's in his feet at times but denies hypoglycemia , polydipsia, polyuria and visual disturbances. Last A1C in the office was:  Lab Results  Component Value Date   HGBA1C 8.1* 02/07/2015    Lab Results  Component Value Date   GFRNONAA 88 02/07/2015   Patient is on Vitamin D supplement.   Lab Results  Component Value Date   VD25OH  49 02/07/2015     He follows with Dr. Berenice Primas for knee pain.    Names of Other Physician/Practitioners you currently use: 1. [facility] here for primary care 2. [facility],  Dr. Clydene Laming, eye doctor, last visit July 2016 3. Dr. Burt Knack, dentist, last visit q 6 months Patient Care Team: Unk Pinto, MD as PCP - General (Internal Medicine) Jenel Lucks. Clydene Laming, MD as Consulting Physician (Ophthalmology) Minus Breeding, MD as Consulting Physician (Cardiology) Clarene Essex, MD as Consulting Physician (Gastroenterology) Berenice Primas, MD as Referring Physician (Orthopedic Surgery)  Medication Review: Current Outpatient Prescriptions on File Prior to Visit  Medication Sig Dispense Refill  . aspirin 81 MG tablet Take 81 mg by mouth daily.    [name] glimepiride (AMARYL) 4 MG tablet Take 1 tablet (4 mg total) by mouth 2 (two) times daily. 60 tablet 4  . meloxicam (MOBIC) 15 MG tablet     . metFORMIN (GLUCOPHAGE) 500 MG tablet TAKE 2 TABLETS BY MOUTH EVERY MORNING AND 2 EVERY EVENING 120 tablet 1  . metoprolol tartrate (LOPRESSOR) 25 MG tablet Take 1 tablet (25 mg total) by mouth 2 (two) times daily. 60 tablet 2  . Multiple Vitamin (MULTIVITAMIN) tablet Take 1 tablet by mouth daily.    . pravastatin (PRAVACHOL) 80 MG tablet Take 1 tablet (80 mg total) by mouth every evening. 30 tablet 9   No current facility-administered medications on file prior to visit.    Current Problems (verified) Patient Active Problem List   Diagnosis Date Noted  . Encounter for Medicare annual wellness exam 09/21/2014  . Essential hypertension 10/11/2013  . Vitamin D deficiency 10/11/2013  . Medication management 10/11/2013  . Diabetes mellitus with circulatory complication ([place]) [id number]  . Hyperlipidemia 03/01/2010  . Anemia 03/01/2010  . Coronary atherosclerosis 03/01/2010  . Carotid stenosis 03/01/2010    Screening Tests Health Maintenance  Topic Date Due  . PNA vac Low Risk Adult (2 of 2 - PPSV23) 02/03/2015  . OPHTHALMOLOGY EXAM  03/31/2015  . ZOSTAVAX  01/26/2016 (Originally 06/07/2008)  . HEMOGLOBIN A1C  08/07/2015  . INFLUENZA VACCINE  10/10/2015  . FOOT EXAM  02/07/2016  .  URINE MICROALBUMIN  02/07/2016  . COLONOSCOPY  08/22/2022  . TETANUS/TDAP  02/06/2025  . Hepatitis C Screening  Completed    Immunization History  Administered Date(s) Administered  . DT 02/07/2015  . Influenza Whole 12/10/2012  . Influenza, High Dose Seasonal PF 02/07/2015  . Influenza-Unspecified 12/31/2013  . Pneumococcal Conjugate-13 02/02/2014  . Pneumococcal Polysaccharide-23 04/13/2003  . Td 04/18/2004   Preventative care: Tetanus:2016 Pneumovax: 2005 Prevnar 13: 2015 Flu vaccine:2016 Zostavax: declines  DEXA: declines Colonoscopy: 2014 due 2024 EGD:N/A Echo 01/2008 Stress test 2013 MRI head 03/2007 Carotid US- 02/2015 CXR 01/2013   Past Surgical History  Procedure Laterality Date  . Coronary artery bypass graft  2008    LIMA to the LAD, saphenous vein graft to diagonal, saphenous vein graft to OM, saphenous vein graft to posterolateral 2008   Family History  Problem Relation Age of Onset  . Hypertension Father   . Heart disease Father   . Hyperlipidemia Father   . Heart attack Father   . Diabetes Sister   . Heart disease Brother     History reviewed: allergies, current medications, past family history, past medical history, past social history, past surgical history and problem list  Risk Factors: Tobacco Social History  Substance Use Topics  . 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 Smokeless tobacco: None  . Alcohol Use: No   He does not smoke.  Patient is a former smoker.  Alcohol Current alcohol use: none  Caffeine Current caffeine use: coffee 1 /day  Exercise Current exercise: walking  Nutrition/Diet Current diet: in general, a "healthy" diet    Cardiac risk factors: advanced age (older than 53 for men, 74 for women), diabetes mellitus, dyslipidemia, hypertension, male gender and sedentary lifestyle.  Depression Screen (Note: if answer to either of the following is "Yes", a more complete depression screening is  indicated)   Q1: Over the past two weeks, have you felt down, depressed or hopeless? No  Q2: Over the past two weeks, have you felt little interest or pleasure in doing things? No  Have you lost interest or pleasure in daily life? No  Do you often feel hopeless? No  Do you cry easily over simple problems? No  Activities of Daily Living In your present state of health, do you have any difficulty performing the following activities?:  Driving? No Managing money?  No Feeding yourself? No Getting from bed to chair? No Climbing a flight of stairs? No Preparing food and eating?: No Bathing or showering? No Getting dressed: No Getting to the toilet? No Using the toilet:No Moving around from place to place: No In the past year have you fallen or had a near fall?:No  Vision Difficulties: No  Hearing Difficulties: No Do you often ask people to speak up or repeat themselves? No Do you experience ringing or noises in your ears? No Do you have difficulty understanding soft or whispered voices? No  Cognition  Do you feel that you have a problem with memory?No  Do you often misplace items? No  Do you feel safe at home?  Yes  Advanced directives Does patient have a Hana? Yes Does patient have a Living Will? Yes   Objective:   Blood pressure 138/86, pulse 70, temperature 97.9 F (36.6 C), temperature source Temporal, resp. rate 16, height 5\' 1"  (1.549 m), weight 155 lb (70.308 kg), SpO2 96 %. Body mass index is 29.3 kg/(m^2).  General appearance: alert, no distress, WD/WN, male Cognitive Testing  Alert? Yes  Normal Appearance?Yes  Oriented to person? Yes  Place? Yes   Time? Yes  Recall of three objects?  Yes  Can perform simple calculations? Yes  Displays appropriate judgment?Yes  Can read the correct time from a watch face?Yes  HEENT: normocephalic, sclerae anicteric, TMs pearly, nares patent, no discharge or erythema, pharynx normal Oral cavity: MMM,  no lesions Neck: supple, no lymphadenopathy, no thyromegaly, no masses Heart: RRR, normal S1, S2, no murmurs Lungs: CTA bilaterally, no wheezes, rhonchi, or rales Abdomen: +bs, soft, non tender, non distended, no masses, no hepatomegaly, no splenomegaly Musculoskeletal: nontender, no swelling, no obvious deformity Extremities: no edema, no cyanosis, no clubbing Pulses: 2+ symmetric, upper and lower extremities, normal cap refill Neurological: alert, oriented x 3, CN2-12 intact, strength normal upper extremities and lower extremities, sensation abnormal bilateral feet, DTRs 2+ throughout, no cerebellar signs, gait normal Psychiatric: normal affect, behavior normal, pleasant   Medicare Attestation I have personally reviewed: The patient's medical and social history Their use of alcohol, tobacco or illicit drugs Their current medications and supplements The patient's functional ability including ADLs,fall risks, home safety risks, cognitive, and hearing and visual impairment Diet and physical activities Evidence for depression or mood disorders  The patient's weight, height, BMI, and visual acuity have  been recorded in the chart.  I have made referrals, counseling, and provided education to the patient based on review of the above and I have provided the patient with a written personalized care plan for preventive services.     [name], PA-C   05/19/2015

## 2015-06-15 ENCOUNTER — Other Ambulatory Visit: Payer: Self-pay | Admitting: Internal Medicine

## 2015-06-20 ENCOUNTER — Encounter: Payer: Self-pay | Admitting: Physician Assistant

## 2015-06-20 ENCOUNTER — Ambulatory Visit (INDEPENDENT_AMBULATORY_CARE_PROVIDER_SITE_OTHER): Payer: Medicare Other | Admitting: Physician Assistant

## 2015-06-20 VITALS — BP 126/78 | HR 87 | Temp 97.5°F | Resp 16 | Ht 61.0 in | Wt 146.2 lb

## 2015-06-20 DIAGNOSIS — E1159 Type 2 diabetes mellitus with other circulatory complications: Secondary | ICD-10-CM | POA: Diagnosis not present

## 2015-06-20 DIAGNOSIS — Z91048 Other nonmedicinal substance allergy status: Secondary | ICD-10-CM

## 2015-06-20 DIAGNOSIS — E782 Mixed hyperlipidemia: Secondary | ICD-10-CM

## 2015-06-20 DIAGNOSIS — I10 Essential (primary) hypertension: Secondary | ICD-10-CM | POA: Diagnosis not present

## 2015-06-20 DIAGNOSIS — Z9109 Other allergy status, other than to drugs and biological substances: Secondary | ICD-10-CM

## 2015-06-20 DIAGNOSIS — I2510 Atherosclerotic heart disease of native coronary artery without angina pectoris: Secondary | ICD-10-CM

## 2015-06-20 MED ORDER — SITAGLIPTIN PHOSPHATE 100 MG PO TABS: 100.0000 mg | ORAL_TABLET | Freq: Every day | ORAL | Status: DC

## 2015-06-20 NOTE — Patient Instructions (Addendum)
Avoid bananas NO DRIED FRUIT  Take the crestor 1/2 pill or 10mg  daily, recheck cholesterol in 2 months with A1C  Will refer you to DM educator  TAKE THE SAMPLES OF JANUMET XR 100/1000MG  ONCE IN THE MORNING AND 2 OF YOUR METFORMIN (1000MG ) LATER IN THE DAY FOLLOW UP 2 MONTHS  Call medicare and see what diabetic medication they cover Specifically like DDP 4 inhibitors.  Januvia Nesina Onglyza Trajenta      Bad carbs also include fruit juice, alcohol, and sweet tea. These are empty calories that do not signal to your brain that you are full.   Please remember the good carbs are still carbs which convert into sugar. So please measure them out no more than 1/2-1 cup of rice, oatmeal, pasta, and beans  Veggies are however free foods! Pile them on.   Not all fruit is created equal. Please see the list below, the fruit at the bottom is higher in sugars than the fruit at the top. Please avoid all dried fruits.     Potassium Content of Foods  The body needs potassium to control blood pressure and to keep the muscles and nervous system healthy. Here are some healthy foods below that are high in potassium. Also you can get the white label salt of "NO SALT" salt substitute, 1/4 teaspoon of this is equivalent to 22meq potassium.   FOODS AND DRINKS HIGH IN POTASSIUM FOODS MODERATE IN POTASSIUM   Fruits  Avocado (cubed),  c / 50 g.  Banana (sliced), 75 g.  Cantaloupe (cubed), 80 g.  Honeydew, 1 wedge / 85 g.  Kiwi (sliced), 90 g.  Nectarine, 1 small / 129 g.  Orange, 1 medium / 131 g. Vegetables  Artichoke,  of a medium / 64 g.  Asparagus (boiled), 90 g..  Broccoli (boiled), 78 g.  Brussels sprout (boiled), 78 g.  Butternut squash (baked), 103 g.  Chickpea (cooked), 82 g.  Green peas (cooked), 80 g.  Kidney beans (cooked), 5 tbsp / 55 g.  Lima beans (cooked),  c / 43 g.  Navy beans (cooked),  c / 61 g.  Spinach (cooked),  c / 45 g.  Sweet potato (baked),  c  / 50 g.  Tomato (chopped or sliced), 90 g.  Vegetable juice.  White mushrooms (cooked), 78 g.  Yam (cooked or baked),  c / 34 g.  Zucchini squash (boiled), 90 g. Other Foods and Drinks  Almonds (whole),  c / 36 g.  Fish, 3 oz / 85 g.  Nonfat fruit variety yogurt, 123 g.  Pistachio nuts, 1 oz / 28 g.  Pumpkin seeds, 1 oz / 28 g.  Red meat (broiled, cooked, grilled), 3 oz / 85 g.  Scallops (steamed), 3 oz / 85 g.  Spaghetti sauce,  c / 66 g.  Sunflower seeds (dry roasted), 1 oz / 28 g.  Veggie burger, 1 patty / 70 g. Fruits  Grapefruit,  of the fruit / [id number] g  Plums (sliced), 83 g.  Tangerine, 1 large / 120 g. Vegetables  Carrots (boiled), 78 g.  Carrots (sliced), 61 g.  Rhubarb (cooked with sugar), 120 g.  Rutabaga (cooked), 120 g.  Yellow snap beans (cooked), 63 g. Other Foods and Drinks   Chicken breast (roasted and chopped),  c / 70 g.  Pita bread, 1 large / 64 g.  Shrimp (steamed), 4 oz / 113 g.  Swiss cheese (diced), 70 g.      HOW TO TREAT VIRAL  COUGH AND COLD SYMPTOMS:  -Symptoms usually last at least 1 week with the worst symptoms being around day 4.  - colds usually start with a sore throat and end with a cough, and the cough can take 2 weeks to get better.  -No antibiotics are needed for colds, flu, sore throats, cough, bronchitis UNLESS symptoms are longer than 7 days OR if you are getting better then get drastically worse.  -There are a lot of combination medications (Dayquil, Nyquil, Vicks 44, tyelnol cold and sinus, ETC). Please look at the ingredients on the back so that you are treating the correct symptoms and not doubling up on medications/ingredients.    Medicines you can use  Nasal congestion  - pseudoephedrine (Sudafed)- behind the counter, do not use if you have high blood pressure, medicine that have -D in them.  - phenylephrine (Sudafed PE) -Dextormethorphan + chlorpheniramine (Coridcidin HBP)- okay if you have high  blood pressure -Oxymetazoline (Afrin) nasal spray- LIMIT to 3 days -Saline nasal spray -Neti pot (used distilled or bottled water)  Ear pain/congestion  -pseudoephedrine (sudafed) - Nasonex/flonase nasal spray  Fever  -Acetaminophen (Tyelnol) -Ibuprofen (Advil, motrin, aleve)  Sore Throat  -Acetaminophen (Tyelnol) -Ibuprofen (Advil, motrin, aleve) -Drink a lot of water -Gargle with salt water - Rest your voice (don't talk) -Throat sprays -Cough drops  Body Aches  -Acetaminophen (Tyelnol) -Ibuprofen (Advil, motrin, aleve)  Headache  -Acetaminophen (Tyelnol) -Ibuprofen (Advil, motrin, aleve) - Exedrin, Exedrin Migraine  Allergy symptoms (cough, sneeze, runny nose, itchy eyes) -Claritin or loratadine cheapest but likely the weakest  -Zyrtec or certizine at night because it can make you sleepy -The strongest is allegra or fexafinadine  Cheapest at [employer], [employer], [employer]  Cough  -Dextromethorphan (Delsym)- medicine that has DM in it -Guafenesin (Mucinex/Robitussin) - cough drops - drink lots of water  Chest Congestion  -Guafenesin (Mucinex/Robitussin)  Red Itchy Eyes  - Naphcon-A  Upset Stomach  - Bland diet (nothing spicy, greasy, fried, and high acid foods like tomatoes, oranges, berries) -OKAY- cereal, bread, soup, crackers, rice -Eat smaller more frequent meals -reduce caffeine, no alcohol -Loperamide (Imodium-AD) if diarrhea -Prevacid for heart burn  General health when sick  -Hydration -wash your hands frequently -keep surfaces clean -change pillow cases and sheets often -Get fresh air but do not exercise strenuously -Vitamin D, double up on it - Vitamin C -Zinc

## 2015-06-20 NOTE — Progress Notes (Signed)
Diabetes Education and Follow-Up Visit  65 y.o.male presents for diabetic education.  Patient denies hyperglycemia, hypoglycemia , paresthesia of the feet, polydipsia, polyuria and visual disturbances. The patient is on an ACE/ARB, he is on a low dose aspirin at this time. The patient does exercise, has a treadmill at home, has some issues with his knees, will go see Dr. Berenice Primas. He does not currently smoke, and does not currently drink alcohol.  The patient is checking his sugars at home. In the AM his sugars are running 140. Has had hypoglycemia in the past 3-4 years ago.  He is unable to tolerate statins expect crestor, has pharmacist friend and got 20mg  pills that he is starting and tolerating.  He is on metformin 4 tabs a day, he is on amaryl once a day but states it will make his sugar go too low sometimes.   ROS: no polyuria or polydipsia, no chest pain, dyspnea or TIA's, no numbness, tingling or pain in extremities  Physical Exam: Blood pressure 126/78, pulse 87, temperature 97.5 F (36.4 C), temperature source Temporal, resp. rate 16, height 5\' 1"  (1.549 m), weight 146 lb 3.2 oz (66.316 kg), SpO2 96 %. Body mass index is 27.64 kg/(m^2).  Wt Readings from Last 3 Encounters:  06/20/15 146 lb 3.2 oz (66.316 kg)  05/19/15 155 lb (70.308 kg)  02/10/15 150 lb 7 oz (68.238 kg)   General Appearance:  alert, oriented, no acute distress and well nourished heart sounds regular rate and rhythm, S1, S2 normal, no murmur, click, rub or gallop, chest clear, no hepatosplenomegaly, no carotid bruits, feet: normal DP and PT pulses, no trophic changes or ulcerative lesions and normal sensory exam  Labs: Lab Results  Component Value Date   HGBA1C 9.3* 05/19/2015    Lab Results  Component Value Date   MICROALBUR 0.7 02/07/2015    Lab Results  Component Value Date   CHOL 156 05/19/2015   HDL 34* 05/19/2015   LDLCALC 85 05/19/2015   TRIG 187* 05/19/2015   CHOLHDL 4.6 05/19/2015     Plan and  Assessment: Diabetes Mellitus type 2:  Diabetes mellitus Type II, under poor control. Education: Reviewed 'ABCs' of diabetes management (respective goals in parentheses):  A1C (<7), blood pressure (<130/80), and cholesterol (LDL <100) Eye Exam yearly and Dental Exam every 6 months. Dietary recommendations Physical Activity recommendations Continue metformin 1000mg  a day Will call his insurance to see what pills are covered, will give samples of januvia Will refer to DM educator  Compliance at present is estimated to be good.   Blood pressure: normal blood pressure.   An ACE/ARB is currently part of their treatment regimen.  LDL goal of < 100, HDL > 40 and TG < 150. Dyslipidemia under fair control. A statin is currently part of their treatment regimen.  Follow up: 2 months   Future Appointments Date Time Provider [place]  08/24/2015 8:45 AM Unk Pinto, MD [facility] None  02/13/2016 10:00 AM [name], PA-C [facility] None

## 2015-07-16 ENCOUNTER — Other Ambulatory Visit: Payer: Self-pay | Admitting: Physician Assistant

## 2015-08-13 ENCOUNTER — Other Ambulatory Visit: Payer: Self-pay | Admitting: Internal Medicine

## 2015-08-24 ENCOUNTER — Ambulatory Visit: Payer: Self-pay | Admitting: Internal Medicine

## 2015-09-08 ENCOUNTER — Ambulatory Visit (INDEPENDENT_AMBULATORY_CARE_PROVIDER_SITE_OTHER): Payer: Medicare Other | Admitting: Internal Medicine

## 2015-09-08 VITALS — BP 126/80 | HR 80 | Temp 97.7°F | Resp 16 | Ht 61.0 in | Wt 148.4 lb

## 2015-09-08 DIAGNOSIS — N181 Chronic kidney disease, stage 1: Secondary | ICD-10-CM | POA: Diagnosis not present

## 2015-09-08 DIAGNOSIS — Z79899 Other long term (current) drug therapy: Secondary | ICD-10-CM

## 2015-09-08 DIAGNOSIS — E559 Vitamin D deficiency, unspecified: Secondary | ICD-10-CM | POA: Diagnosis not present

## 2015-09-08 DIAGNOSIS — E782 Mixed hyperlipidemia: Secondary | ICD-10-CM

## 2015-09-08 DIAGNOSIS — Z91199 Patient's noncompliance with other medical treatment and regimen due to unspecified reason: Secondary | ICD-10-CM

## 2015-09-08 DIAGNOSIS — I10 Essential (primary) hypertension: Secondary | ICD-10-CM | POA: Diagnosis not present

## 2015-09-08 DIAGNOSIS — E1122 Type 2 diabetes mellitus with diabetic chronic kidney disease: Secondary | ICD-10-CM

## 2015-09-08 DIAGNOSIS — Z9119 Patient's noncompliance with other medical treatment and regimen: Secondary | ICD-10-CM

## 2015-09-08 DIAGNOSIS — E119 Type 2 diabetes mellitus without complications: Secondary | ICD-10-CM | POA: Insufficient documentation

## 2015-09-08 LAB — CBC WITH DIFFERENTIAL/PLATELET
BASOS ABS: 96 {cells}/uL (ref 0–200)
BASOS PCT: 1 %
EOS ABS: 672 {cells}/uL — AB (ref 15–500)
Eosinophils Relative: 7 %
HEMATOCRIT: 34.5 % — AB (ref 38.5–50.0)
Hemoglobin: 10.6 g/dL — ABNORMAL LOW (ref 13.2–17.1)
LYMPHS PCT: 22 %
Lymphs Abs: 2112 cells/uL (ref 850–3900)
MCH: 20 pg — AB (ref 27.0–33.0)
MCHC: 30.7 g/dL — ABNORMAL LOW (ref 32.0–36.0)
MCV: 65.1 fL — AB (ref 80.0–100.0)
MONO ABS: 1056 {cells}/uL — AB (ref 200–950)
MPV: 10.8 fL (ref 7.5–12.5)
Monocytes Relative: 11 %
NEUTROS PCT: 59 %
Neutro Abs: 5664 cells/uL (ref 1500–7800)
Platelets: 326 10*3/uL (ref 140–400)
RBC: 5.3 MIL/uL (ref 4.20–5.80)
RDW: 17.4 % — AB (ref 11.0–15.0)
WBC: 9.6 10*3/uL (ref 3.8–10.8)

## 2015-09-08 LAB — BASIC METABOLIC PANEL WITH GFR
BUN: 9 mg/dL (ref 7–25)
CO2: 23 mmol/L (ref 20–31)
CREATININE: 0.9 mg/dL (ref 0.70–1.25)
Calcium: 9.1 mg/dL (ref 8.6–10.3)
Chloride: 100 mmol/L (ref 98–110)
GFR, Est Non African American: 88 mL/min (ref >=60)
GLUCOSE: 118 mg/dL — AB (ref 65–99)
POTASSIUM: 5.7 mmol/L — AB (ref 3.5–5.3)
Sodium: 132 mmol/L — ABNORMAL LOW (ref 135–146)

## 2015-09-08 LAB — HEPATIC FUNCTION PANEL
ALBUMIN: 4.1 g/dL (ref 3.6–5.1)
ALK PHOS: 51 U/L (ref 40–115)
ALT: 13 U/L (ref 9–46)
AST: 15 U/L (ref 10–35)
Bilirubin, Direct: 0.1 mg/dL (ref <=0.2)
Indirect Bilirubin: 0.4 mg/dL (ref 0.2–1.2)
TOTAL PROTEIN: 6.4 g/dL (ref 6.1–8.1)
Total Bilirubin: 0.5 mg/dL (ref 0.2–1.2)

## 2015-09-08 LAB — LIPID PANEL
Cholesterol: 98 mg/dL — ABNORMAL LOW (ref 125–200)
HDL: 42 mg/dL (ref >=40)
LDL CALC: 42 mg/dL (ref <130)
TRIGLYCERIDES: 71 mg/dL (ref <150)
Total CHOL/HDL Ratio: 2.3 Ratio (ref <=5.0)
VLDL: 14 mg/dL (ref <30)

## 2015-09-08 LAB — TSH: TSH: 2.23 m[IU]/L (ref 0.40–4.50)

## 2015-09-08 LAB — HEMOGLOBIN A1C
Hgb A1c MFr Bld: 7.1 % — ABNORMAL HIGH (ref <5.7)
MEAN PLASMA GLUCOSE: 157 mg/dL

## 2015-09-08 LAB — MAGNESIUM: Magnesium: 1.6 mg/dL (ref 1.5–2.5)

## 2015-09-08 NOTE — Patient Instructions (Addendum)

## 2015-09-08 NOTE — Progress Notes (Signed)
Patient ID: William Haney, male   DOB: 1950-11-21, 65 y.o.   MRN: [id number]   [facility]                       Unk Pinto, M.D.        Uvaldo Bristle. Silverio Lay, P.A.-C       Starlyn Skeans, P.A.-C   [facility]                [address] [place], [place] [id number] Telephone [phone] Telefax [phone] ______________________________________________________________________     This very nice 65 y.o. M Arabic Male presents for follow up with Hypertension, ASCADF/CABG,  Hyperlipidemia, Pre-Diabetes and Vitamin D Deficiency.      Patient is treated for HTN since 2008 when he presented with ACS and underwent CABG and has been followed by Dr Percival Spanish.  BP has been controlled at home. Today's BP is 126/80 mmHg. Patient has had no complaints of any cardiac type chest pain, palpitations, dyspnea/orthopnea/PND, dizziness, claudication, or dependent edema.     Hyperlipidemia is controlled with diet & meds. Patient denies myalgias or other med SE's. Last Lipids were Cholesterol 156; HDL 34*; LDL 85; Triglycerides 187 on 05/19/2015. Since last OV, patient has self d/c'd his Pravastatin due to perceived side -effects and currently he is on a low dose of Crestor that he procures from some relative in the medical field.      Also, the patient has history of T2_NIDDM since circa 2000 and his control is limited by poor dietary compliance and chart review finds that in the past he has self d/c'd Invokana and more recently Januvia.  He denies symptoms of reactive hypoglycemia, diabetic polys, paresthesias or visual blurring.  Last A1c was not at goal with A1c 9.3% on 05/19/2015.       Further, the patient also has history of Vitamin D Deficiency and does not supplements vitamin D as recommended on several occasions in the past.  Last vitamin D was still very low at 37 on 05/19/2015.  Medication Sig  . aspirin 81 MG Take 81 mg  by mouth daily.  . Enalapril  10 MG  Take 1 tablet (10 mg total) by mouth daily.   Well Chol  Takes 2 tabletys 2 x / daily   . glimepiride 4 MG  TAKE 1 TABLET (4 MG TOTAL) BY MOUTH 2 (TWO) TIMES DAILY.  . metFORMIN 500 MG  TAKE 2 TABLETS BY MOUTH EVERY MORNING AND 2 EVERY EVENING  . metoprolol tartrate  25 MG  Take 1 tablet (25 mg total) by mouth 2 (two) times daily.  . Multiple Vitamin  Take 1 tablet by mouth daily.   Crestor 10 mg  Takes 1/2 tab = 5 mg  of samples that he got from a relative who's in the medical field  . JANUVIA  100 MG  STOPPED due to COST $$$   Allergies  Allergen Reactions  . Statins Other (See Comments)    Elevated LFT's   PMHx:   Past Medical History  Diagnosis Date  . Coronary artery disease     s/p CABG in 2008  . Carotid artery stenosis     60 %-80 %  . Dyslipidemia   . Anemia   . Diabetes mellitus type II   . Hypertension   . Hyperlipidemia   . Vitamin  D deficiency    Immunization History  Administered Date(s) Administered  . DT 02/07/2015  . Influenza Whole 12/10/2012  . Influenza, High Dose Seasonal PF 02/07/2015  . Influenza-Unspecified 12/31/2013  . Pneumococcal Conjugate-13 02/02/2014  . Pneumococcal Polysaccharide-23 04/13/2003  . Td 04/18/2004   Past Surgical History  Procedure Laterality Date  . Coronary artery bypass graft  2008    LIMA to the LAD, saphenous vein graft to diagonal, saphenous vein graft to OM, saphenous vein graft to posterolateral 2008   FHx:    Reviewed / unchanged  SHx:    Reviewed / unchanged  Systems Review:  Constitutional: Denies fever, chills, wt changes, headaches, insomnia, fatigue, night sweats, change in appetite. Eyes: Denies redness, blurred vision, diplopia, discharge, itchy, watery eyes.  ENT: Denies discharge, congestion, post nasal drip, epistaxis, sore throat, earache, hearing loss, dental pain, tinnitus, vertigo, sinus pain, snoring.  CV: Denies chest pain, palpitations, irregular heartbeat,  syncope, dyspnea, diaphoresis, orthopnea, PND, claudication or edema. Respiratory: denies cough, dyspnea, DOE, pleurisy, hoarseness, laryngitis, wheezing.  Gastrointestinal: Denies dysphagia, odynophagia, heartburn, reflux, water brash, abdominal pain or cramps, nausea, vomiting, bloating, diarrhea, constipation, hematemesis, melena, hematochezia  or hemorrhoids. Genitourinary: Denies dysuria, frequency, urgency, nocturia, hesitancy, discharge, hematuria or flank pain. Musculoskeletal: Denies arthralgias, myalgias, stiffness, jt. swelling, pain, limping or strain/sprain.  Skin: Denies pruritus, rash, hives, warts, acne, eczema or change in skin lesion(s). Neuro: No weakness, tremor, incoordination, spasms, paresthesia or pain. Psychiatric: Denies confusion, memory loss or sensory loss. Endo: Denies change in weight, skin or hair change.  Heme/Lymph: No excessive bleeding, bruising or enlarged lymph nodes.  Physical Exam  BP 126/80  Pulse 80  Temp 97.7 F  Resp 16  Ht 5\' 1"    Wt 148 lb 6.4 oz     BMI 28.05   Appears well nourished and in no distress. Eyes: PERRLA, EOMs, conjunctiva no swelling or erythema. Sinuses: No frontal/maxillary tenderness ENT/Mouth: EAC's clear, TM's nl w/o erythema, bulging. Nares clear w/o erythema, swelling, exudates. Oropharynx clear without erythema or exudates. Oral hygiene is good. Tongue normal, non obstructing. Hearing intact.  Neck: Supple. Thyroid nl. Car 2+/2+ without bruits, nodes or JVD. Chest: Respirations nl with BS clear & equal w/o rales, rhonchi, wheezing or stridor.  Cor: Heart sounds normal w/ regular rate and rhythm without sig. murmurs, gallops, clicks, or rubs. Peripheral pulses normal and equal  without edema.  Abdomen: Soft & bowel sounds normal. Non-tender w/o guarding, rebound, hernias, masses, or organomegaly.  Lymphatics: Unremarkable.  Musculoskeletal: Full ROM all peripheral extremities, joint stability, 5/5 strength, and normal  gait.  Skin: Warm, dry without exposed rashes, lesions or ecchymosis apparent.  Neuro: Cranial nerves intact, reflexes equal bilaterally. Sensory-motor testing grossly intact. Tendon reflexes grossly intact.  Pysch: Alert & oriented x 3.  Insight and judgement nl & appropriate. No ideations.  Assessment and Plan:  1. Essential hypertension  - Continue monitor blood pressure at home. Continue diet/meds same. - TSH  2. Hyperlipidemia  - Continue diet/meds, exercise,& lifestyle modifications. Continue monitor periodic cholesterol/liver & renal functions  - Lipid panel - TSH  3. Type 2 diabetes mellitus with stage 1 chronic kidney disease, without long-term current use of insulin (HCC)  - Continue diet, exercise, lifestyle modifications. Monitor appropriate labs. - Hemoglobin A1c - Insulin, random  4. Vitamin D deficiency   - Continue supplementation. - VITAMIN D 25 Hydroxy   5. Medication management  - CBC with Differential/Platelet - BASIC METABOLIC PANEL WITH GFR - Hepatic function panel -  Magnesium  6. Poor Compliance   Recommended regular exercise, BP monitoring, weight control, and discussed med and SE's. Recommended labs to assess and monitor clinical status. Further disposition pending results of labs. Over 30 minutes of exam, counseling, chart review was performed

## 2015-09-09 ENCOUNTER — Encounter: Payer: Self-pay | Admitting: Internal Medicine

## 2015-09-09 DIAGNOSIS — Z91199 Patient's noncompliance with other medical treatment and regimen due to unspecified reason: Secondary | ICD-10-CM | POA: Insufficient documentation

## 2015-09-09 DIAGNOSIS — Z9119 Patient's noncompliance with other medical treatment and regimen: Secondary | ICD-10-CM | POA: Insufficient documentation

## 2015-09-09 LAB — INSULIN, RANDOM: INSULIN: 2.5 u[IU]/mL (ref 2.0–19.6)

## 2015-09-09 MED ORDER — COLESEVELAM HCL 625 MG PO TABS: ORAL_TABLET | ORAL | Status: DC

## 2015-09-11 ENCOUNTER — Other Ambulatory Visit: Payer: Self-pay | Admitting: Internal Medicine

## 2015-09-11 DIAGNOSIS — D649 Anemia, unspecified: Secondary | ICD-10-CM

## 2015-09-11 LAB — VITAMIN D 25 HYDROXY (VIT D DEFICIENCY, FRACTURES): Vit D, 25-Hydroxy: 37 ng/mL (ref 30–100)

## 2015-09-19 ENCOUNTER — Other Ambulatory Visit: Payer: Self-pay | Admitting: Physician Assistant

## 2015-09-20 ENCOUNTER — Other Ambulatory Visit: Payer: Self-pay | Admitting: Physician Assistant

## 2015-09-21 ENCOUNTER — Other Ambulatory Visit: Payer: Self-pay

## 2015-09-21 MED ORDER — GLIMEPIRIDE 4 MG PO TABS: ORAL_TABLET | ORAL | Status: DC

## 2015-10-21 ENCOUNTER — Other Ambulatory Visit: Payer: Self-pay | Admitting: Cardiology

## 2015-11-08 DIAGNOSIS — H3561 Retinal hemorrhage, right eye: Secondary | ICD-10-CM | POA: Diagnosis not present

## 2015-11-08 DIAGNOSIS — E113291 Type 2 diabetes mellitus with mild nonproliferative diabetic retinopathy without macular edema, right eye: Secondary | ICD-10-CM | POA: Diagnosis not present

## 2015-11-08 DIAGNOSIS — E113292 Type 2 diabetes mellitus with mild nonproliferative diabetic retinopathy without macular edema, left eye: Secondary | ICD-10-CM | POA: Diagnosis not present

## 2015-11-08 DIAGNOSIS — H3562 Retinal hemorrhage, left eye: Secondary | ICD-10-CM | POA: Diagnosis not present

## 2015-11-08 LAB — HM DIABETES EYE EXAM

## 2015-11-28 DIAGNOSIS — M25561 Pain in right knee: Secondary | ICD-10-CM | POA: Diagnosis not present

## 2015-11-28 DIAGNOSIS — M25562 Pain in left knee: Secondary | ICD-10-CM | POA: Diagnosis not present

## 2015-12-16 ENCOUNTER — Other Ambulatory Visit: Payer: Self-pay | Admitting: Cardiology

## 2016-01-02 ENCOUNTER — Ambulatory Visit (INDEPENDENT_AMBULATORY_CARE_PROVIDER_SITE_OTHER): Payer: Medicare Other | Admitting: Internal Medicine

## 2016-01-02 VITALS — BP 146/78 | HR 90 | Temp 98.2°F | Resp 16 | Ht 61.0 in | Wt 150.0 lb

## 2016-01-02 DIAGNOSIS — E1142 Type 2 diabetes mellitus with diabetic polyneuropathy: Secondary | ICD-10-CM

## 2016-01-02 DIAGNOSIS — E1159 Type 2 diabetes mellitus with other circulatory complications: Secondary | ICD-10-CM

## 2016-01-02 DIAGNOSIS — I10 Essential (primary) hypertension: Secondary | ICD-10-CM | POA: Diagnosis not present

## 2016-01-02 DIAGNOSIS — Z23 Encounter for immunization: Secondary | ICD-10-CM

## 2016-01-02 DIAGNOSIS — E782 Mixed hyperlipidemia: Secondary | ICD-10-CM

## 2016-01-02 DIAGNOSIS — N181 Chronic kidney disease, stage 1: Secondary | ICD-10-CM | POA: Diagnosis not present

## 2016-01-02 DIAGNOSIS — E1122 Type 2 diabetes mellitus with diabetic chronic kidney disease: Secondary | ICD-10-CM

## 2016-01-02 DIAGNOSIS — E559 Vitamin D deficiency, unspecified: Secondary | ICD-10-CM | POA: Diagnosis not present

## 2016-01-02 DIAGNOSIS — Z79899 Other long term (current) drug therapy: Secondary | ICD-10-CM | POA: Diagnosis not present

## 2016-01-02 LAB — HEPATIC FUNCTION PANEL
ALT: 18 U/L (ref 9–46)
AST: 15 U/L (ref 10–35)
Albumin: 3.9 g/dL (ref 3.6–5.1)
Alkaline Phosphatase: 48 U/L (ref 40–115)
BILIRUBIN DIRECT: 0.1 mg/dL (ref <=0.2)
BILIRUBIN INDIRECT: 0.3 mg/dL (ref 0.2–1.2)
Total Bilirubin: 0.4 mg/dL (ref 0.2–1.2)
Total Protein: 6.1 g/dL (ref 6.1–8.1)

## 2016-01-02 LAB — CBC WITH DIFFERENTIAL/PLATELET
BASOS PCT: 1 %
Basophils Absolute: 72 cells/uL (ref 0–200)
EOS PCT: 6 %
Eosinophils Absolute: 432 cells/uL (ref 15–500)
HCT: 34.5 % — ABNORMAL LOW (ref 38.5–50.0)
HEMOGLOBIN: 10.4 g/dL — AB (ref 13.2–17.1)
LYMPHS ABS: 1368 {cells}/uL (ref 850–3900)
Lymphocytes Relative: 19 %
MCH: 20 pg — ABNORMAL LOW (ref 27.0–33.0)
MCHC: 30.1 g/dL — ABNORMAL LOW (ref 32.0–36.0)
MCV: 66.3 fL — ABNORMAL LOW (ref 80.0–100.0)
Monocytes Absolute: 792 cells/uL (ref 200–950)
Monocytes Relative: 11 %
NEUTROS ABS: 4536 {cells}/uL (ref 1500–7800)
Neutrophils Relative %: 63 %
Platelets: 336 10*3/uL (ref 140–400)
RBC: 5.2 MIL/uL (ref 4.20–5.80)
RDW: 17.9 % — ABNORMAL HIGH (ref 11.0–15.0)
WBC: 7.2 10*3/uL (ref 3.8–10.8)

## 2016-01-02 LAB — LIPID PANEL
Cholesterol: 140 mg/dL (ref 125–200)
HDL: 33 mg/dL — ABNORMAL LOW (ref >=40)
LDL CALC: 92 mg/dL (ref <130)
TRIGLYCERIDES: 76 mg/dL (ref <150)
Total CHOL/HDL Ratio: 4.2 Ratio (ref <=5.0)
VLDL: 15 mg/dL (ref <30)

## 2016-01-02 LAB — BASIC METABOLIC PANEL WITH GFR
BUN: 9 mg/dL (ref 7–25)
CALCIUM: 8.9 mg/dL (ref 8.6–10.3)
CHLORIDE: 102 mmol/L (ref 98–110)
CO2: 26 mmol/L (ref 20–31)
Creat: 0.95 mg/dL (ref 0.70–1.25)
GFR, EST NON AFRICAN AMERICAN: 82 mL/min (ref >=60)
GFR, Est African American: >89 mL/min (ref >=60)
Glucose, Bld: 141 mg/dL — ABNORMAL HIGH (ref 65–99)
Potassium: 5.2 mmol/L (ref 3.5–5.3)
SODIUM: 135 mmol/L (ref 135–146)

## 2016-01-02 LAB — TSH: TSH: 3.17 m[IU]/L (ref 0.40–4.50)

## 2016-01-02 NOTE — Progress Notes (Signed)
Assessment and Plan:  Hypertension:  -Continue medication -monitor blood pressure at home. -Continue DASH diet -Reminder to go to the ER if any CP, SOB, nausea, dizziness, severe HA, changes vision/speech, left arm numbness and tingling and jaw pain.  Cholesterol -Dr. Percival Spanish changed patient off the welchol and placed him on 80 mg pravastatin -patient is having myalgias with this -consider adding in zetia and cutting back on pravastatin - Continue diet and exercise -Check cholesterol.   Diabetes without complications  -blood sugars have been well controlled -cont current meds -Continue diet and exercise.  -Check A1C  Vitamin D Def -check level -continue medications.   Shoulder pain -cont diclofenac -take breaks from pushups   Continue diet and meds as discussed. Further disposition pending results of labs. Discussed med's effects and SE's.    HPI 65 y.o. male  presents for 3 month follow up with hypertension, hyperlipidemia, diabetes and vitamin D deficiency.   His blood pressure has been controlled at home, today their BP is BP: (!) 146/78.He does workout. He denies chest pain, shortness of breath, dizziness.  He reports that Dr. Percival Spanish changed him to pravastatin recently   He is on cholesterol medication and denies myalgias. His cholesterol is at goal. The cholesterol was:  09/08/2015: Cholesterol 98; HDL 42; LDL Cholesterol 42; Triglycerides 71   He has been working on diet and exercise for diabetes without complications, he is on bASA, he is on ACE/ARB, and denies  foot ulcerations, hyperglycemia, hypoglycemia , increased appetite, nausea, paresthesia of the feet, polydipsia, polyuria, visual disturbances, vomiting and weight loss. Last A1C was: 09/08/2015: Hgb A1c MFr Bld 7.1   Patient is on Vitamin D supplement. 09/08/2015: Vit D, 25-Hydroxy 37  He is seeing Dr. Berenice Primas for his shoulder pain.  He is doing a lot of pushups at home for his exercise which he thinks is  making his shoulders hurt worse.  They did put him on diclofenac for this.    Current Medications:  Current Outpatient Prescriptions on File Prior to Visit  Medication Sig Dispense Refill  . aspirin 81 MG tablet Take 81 mg by mouth daily.    [name] glimepiride (AMARYL) 4 MG tablet TAKE 1 TABLET (4 MG TOTAL) BY MOUTH 2 (TWO) TIMES DAILY. 60 tablet 4  . metFORMIN (GLUCOPHAGE) 500 MG tablet TAKE 2 TABLETS BY MOUTH EVERY MORNING AND 2 EVERY EVENING 360 tablet 1  . metoprolol tartrate (LOPRESSOR) 25 MG tablet Take 1 tablet (25 mg total) by mouth 2 (two) times daily. 60 tablet 2  . metoprolol tartrate (LOPRESSOR) 25 MG tablet TAKE 1 TABLET (25 MG TOTAL) BY MOUTH 2 (TWO) TIMES DAILY. 60 tablet 6  . Multiple Vitamin (MULTIVITAMIN) tablet Take 1 tablet by mouth daily.    . pravastatin (PRAVACHOL) 80 MG tablet TAKE 1 TABLET (80 MG TOTAL) BY MOUTH EVERY EVENING. 30 tablet 2   No current facility-administered medications on file prior to visit.    Medical History:  Past Medical History:  Diagnosis Date  . Anemia   . Carotid artery stenosis    60 %-80 %  . Coronary artery disease    s/p CABG in 2008  . Diabetes mellitus type II   . Dyslipidemia   . Hyperlipidemia   . Hypertension   . Vitamin D deficiency    Allergies:  Allergies  Allergen Reactions  . Statins Other (See Comments)    Elevated LFT's     Review of Systems:  Review of Systems  Constitutional: Negative for  chills, fever and malaise/fatigue.  HENT: Negative for congestion, ear pain and sore throat.   Eyes: Negative.   Respiratory: Negative for cough, shortness of breath and wheezing.   Cardiovascular: Negative for chest pain, palpitations and leg swelling.  Gastrointestinal: Negative for abdominal pain, blood in stool, constipation, diarrhea, heartburn and melena.  Genitourinary: Negative.   Skin: Negative.   Neurological: Negative for dizziness, sensory change, loss of consciousness and headaches.  Psychiatric/Behavioral:  Negative for depression. The patient is not nervous/anxious and does not have insomnia.     Family history- Review and unchanged  Social history- Review and unchanged  Physical Exam: BP (!) 146/78   Pulse 90   Temp 98.2 F (36.8 C) (Temporal)   Resp 16   Ht 5\' 1"  (1.549 m)   Wt 150 lb (68 kg)   BMI 28.34 kg/m  Wt Readings from Last 3 Encounters:  01/02/16 150 lb (68 kg)  09/08/15 148 lb 6.4 oz (67.3 kg)  06/20/15 146 lb 3.2 oz (66.3 kg)   General Appearance: Well nourished well developed, non-toxic appearing, in no apparent distress. Eyes: PERRLA, EOMs, conjunctiva no swelling or erythema ENT/Mouth: Ear canals clear with no erythema, swelling, or discharge.  TMs normal bilaterally, oropharynx clear, moist, with no exudate.   Neck: Supple, thyroid normal, no JVD, no cervical adenopathy.  Respiratory: Respiratory effort normal, breath sounds clear A&P, no wheeze, rhonchi or rales noted.  No retractions, no accessory muscle usage Cardio: RRR with no MRGs. No noted edema.  Abdomen: Soft, + BS.  Non tender, no guarding, rebound, hernias, masses. Musculoskeletal: Full ROM, 5/5 strength, Normal gait Skin: Warm, dry without rashes, lesions, ecchymosis.  Neuro: Awake and oriented X 3, Cranial nerves intact. No cerebellar symptoms.  Psych: normal affect, Insight and Judgment appropriate.    Starlyn Skeans, PA-C 9:05 AM [facility]

## 2016-01-03 LAB — HEMOGLOBIN A1C
Hgb A1c MFr Bld: 7.8 % — ABNORMAL HIGH (ref <5.7)
Mean Plasma Glucose: 177 mg/dL

## 2016-02-13 ENCOUNTER — Encounter: Payer: Self-pay | Admitting: Physician Assistant

## 2016-02-14 ENCOUNTER — Other Ambulatory Visit: Payer: Self-pay | Admitting: Physician Assistant

## 2016-03-13 ENCOUNTER — Other Ambulatory Visit: Payer: Self-pay | Admitting: Cardiology

## 2016-03-13 NOTE — Telephone Encounter (Signed)
REFILL 

## 2016-03-21 DIAGNOSIS — E113291 Type 2 diabetes mellitus with mild nonproliferative diabetic retinopathy without macular edema, right eye: Secondary | ICD-10-CM | POA: Diagnosis not present

## 2016-03-21 DIAGNOSIS — H53413 Scotoma involving central area, bilateral: Secondary | ICD-10-CM | POA: Diagnosis not present

## 2016-03-21 DIAGNOSIS — H40052 Ocular hypertension, left eye: Secondary | ICD-10-CM | POA: Diagnosis not present

## 2016-03-21 DIAGNOSIS — H40051 Ocular hypertension, right eye: Secondary | ICD-10-CM | POA: Diagnosis not present

## 2016-03-21 DIAGNOSIS — E113292 Type 2 diabetes mellitus with mild nonproliferative diabetic retinopathy without macular edema, left eye: Secondary | ICD-10-CM | POA: Diagnosis not present

## 2016-04-03 ENCOUNTER — Encounter: Payer: Self-pay | Admitting: Physician Assistant

## 2016-04-03 ENCOUNTER — Ambulatory Visit (INDEPENDENT_AMBULATORY_CARE_PROVIDER_SITE_OTHER): Payer: Medicare Other | Admitting: Physician Assistant

## 2016-04-03 VITALS — BP 132/80 | HR 82 | Temp 97.9°F | Resp 16 | Ht 64.5 in | Wt 148.4 lb

## 2016-04-03 DIAGNOSIS — I10 Essential (primary) hypertension: Secondary | ICD-10-CM

## 2016-04-03 DIAGNOSIS — D649 Anemia, unspecified: Secondary | ICD-10-CM

## 2016-04-03 DIAGNOSIS — E782 Mixed hyperlipidemia: Secondary | ICD-10-CM | POA: Diagnosis not present

## 2016-04-03 DIAGNOSIS — N181 Chronic kidney disease, stage 1: Secondary | ICD-10-CM

## 2016-04-03 DIAGNOSIS — I6529 Occlusion and stenosis of unspecified carotid artery: Secondary | ICD-10-CM | POA: Diagnosis not present

## 2016-04-03 DIAGNOSIS — Z125 Encounter for screening for malignant neoplasm of prostate: Secondary | ICD-10-CM

## 2016-04-03 DIAGNOSIS — R6889 Other general symptoms and signs: Secondary | ICD-10-CM | POA: Diagnosis not present

## 2016-04-03 DIAGNOSIS — E1142 Type 2 diabetes mellitus with diabetic polyneuropathy: Secondary | ICD-10-CM

## 2016-04-03 DIAGNOSIS — Z0001 Encounter for general adult medical examination with abnormal findings: Secondary | ICD-10-CM

## 2016-04-03 DIAGNOSIS — Z79899 Other long term (current) drug therapy: Secondary | ICD-10-CM

## 2016-04-03 DIAGNOSIS — E1159 Type 2 diabetes mellitus with other circulatory complications: Secondary | ICD-10-CM | POA: Diagnosis not present

## 2016-04-03 DIAGNOSIS — E1122 Type 2 diabetes mellitus with diabetic chronic kidney disease: Secondary | ICD-10-CM

## 2016-04-03 DIAGNOSIS — M542 Cervicalgia: Secondary | ICD-10-CM

## 2016-04-03 DIAGNOSIS — E559 Vitamin D deficiency, unspecified: Secondary | ICD-10-CM

## 2016-04-03 DIAGNOSIS — M25561 Pain in right knee: Secondary | ICD-10-CM

## 2016-04-03 DIAGNOSIS — M25562 Pain in left knee: Secondary | ICD-10-CM

## 2016-04-03 DIAGNOSIS — I2510 Atherosclerotic heart disease of native coronary artery without angina pectoris: Secondary | ICD-10-CM

## 2016-04-03 LAB — CBC WITH DIFFERENTIAL/PLATELET
BASOS PCT: 1 %
Basophils Absolute: 76 cells/uL (ref 0–200)
Eosinophils Absolute: 836 cells/uL — ABNORMAL HIGH (ref 15–500)
Eosinophils Relative: 11 %
HCT: 35.9 % — ABNORMAL LOW (ref 38.5–50.0)
Hemoglobin: 11.5 g/dL — ABNORMAL LOW (ref 13.2–17.1)
LYMPHS PCT: 22 %
Lymphs Abs: 1672 cells/uL (ref 850–3900)
MCH: 20.5 pg — ABNORMAL LOW (ref 27.0–33.0)
MCHC: 32 g/dL (ref 32.0–36.0)
MCV: 64 fL — AB (ref 80.0–100.0)
MONOS PCT: 12 %
Monocytes Absolute: 912 cells/uL (ref 200–950)
Neutro Abs: 4104 cells/uL (ref 1500–7800)
Neutrophils Relative %: 54 %
Platelets: 313 10*3/uL (ref 140–400)
RBC: 5.61 MIL/uL (ref 4.20–5.80)
RDW: 16.4 % — AB (ref 11.0–15.0)
WBC: 7.6 10*3/uL (ref 3.8–10.8)

## 2016-04-03 LAB — HEPATIC FUNCTION PANEL
ALK PHOS: 71 U/L (ref 40–115)
ALT: 23 U/L (ref 9–46)
AST: 19 U/L (ref 10–35)
Albumin: 3.8 g/dL (ref 3.6–5.1)
BILIRUBIN TOTAL: 0.6 mg/dL (ref 0.2–1.2)
Bilirubin, Direct: 0.1 mg/dL (ref <=0.2)
Indirect Bilirubin: 0.5 mg/dL (ref 0.2–1.2)
TOTAL PROTEIN: 6.6 g/dL (ref 6.1–8.1)

## 2016-04-03 LAB — URINALYSIS, ROUTINE W REFLEX MICROSCOPIC
BILIRUBIN URINE: NEGATIVE
GLUCOSE, UA: NEGATIVE
HGB URINE DIPSTICK: NEGATIVE
KETONES UR: NEGATIVE
LEUKOCYTES UA: NEGATIVE
Nitrite: NEGATIVE
PROTEIN: NEGATIVE
Specific Gravity, Urine: 1.016 (ref 1.001–1.035)
pH: 6.5 (ref 5.0–8.0)

## 2016-04-03 LAB — TSH: TSH: 2.52 mIU/L (ref 0.40–4.50)

## 2016-04-03 LAB — URIC ACID: Uric Acid, Serum: 5.5 mg/dL (ref 4.0–8.0)

## 2016-04-03 LAB — LIPID PANEL
CHOL/HDL RATIO: 4.4 ratio (ref <5.0)
CHOLESTEROL: 136 mg/dL (ref <200)
HDL: 31 mg/dL — ABNORMAL LOW (ref >40)
LDL Cholesterol: 85 mg/dL (ref <100)
Triglycerides: 101 mg/dL (ref <150)
VLDL: 20 mg/dL (ref <30)

## 2016-04-03 LAB — BASIC METABOLIC PANEL WITH GFR
BUN: 14 mg/dL (ref 7–25)
CO2: 29 mmol/L (ref 20–31)
Calcium: 9.6 mg/dL (ref 8.6–10.3)
Chloride: 100 mmol/L (ref 98–110)
Creat: 1.01 mg/dL (ref 0.70–1.25)
GFR, EST AFRICAN AMERICAN: 89 mL/min (ref >=60)
GFR, EST NON AFRICAN AMERICAN: 77 mL/min (ref >=60)
Glucose, Bld: 206 mg/dL — ABNORMAL HIGH (ref 65–99)
POTASSIUM: 5.8 mmol/L — AB (ref 3.5–5.3)
SODIUM: 134 mmol/L — AB (ref 135–146)

## 2016-04-03 LAB — MAGNESIUM: MAGNESIUM: 1.9 mg/dL (ref 1.5–2.5)

## 2016-04-03 LAB — MICROALBUMIN / CREATININE URINE RATIO
Creatinine, Urine: 119 mg/dL (ref 20–370)
Microalb Creat Ratio: 7 mcg/mg creat (ref <30)
Microalb, Ur: 0.8 mg/dL

## 2016-04-03 NOTE — Patient Instructions (Addendum)
Please be careful with glimepiride (Amaryl). This medication forces your blood sugar down no matter what it is starting at. Only take 1/2 of the medication if your sugar is above 120 and you can take a whole pill if your sugar is above 150 in the morning. If at any time you start to have low blood sugars in the morning or during the day please stop this medication. Please never take this medication if you are sick or can not eat. A low blood sugar is much more dangerous than a high blood sugar. Your brain needs two things, sugar and oxygen.    Your A1C is a measure of your sugar over the past 3 months and is not affected by what you have eaten over the past few days. Diabetes increases your chances of stroke and heart attack over 300 % and is the leading cause of blindness and kidney failure in the [place]. Please make sure you decrease bad carbs like white bread, white rice, potatoes, corn, soft drinks, pasta, cereals, refined sugars, sweet tea, dried fruits, and fruit juice. Good carbs are okay to eat in moderation like sweet potatoes, brown rice, whole grain pasta/bread, most fruit (except dried fruit) and you can eat as many veggies as you want.   Greater than 6.5 is considered diabetic. Between 6.4 and 5.7 is prediabetic If your A1C is less than 5.7 you are NOT diabetic.  Targets for Glucose Readings: Time of Check Target for patients WITHOUT Diabetes Target for DIABETICS  Before Meals Less than 100  less than 150  Two hours after meals Less than 200  Less than 250    Cervical Radiculopathy  Follow up with ortho Suggest PT Continue voltern Introduction Cervical radiculopathy happens when a nerve in the neck (cervical nerve) is pinched or bruised. This condition can develop because of an injury or as part of the normal aging process. Pressure on the cervical nerves can cause pain or numbness that runs from the neck all the way down into the arm and fingers. Usually, this condition gets  better with rest. Treatment may be needed if the condition does not improve. What are the causes? This condition may be caused by:  Injury.  Slipped (herniated) disk.  Muscle tightness in the neck because of overuse.  Arthritis.  Breakdown or degeneration in the bones and joints of the spine (spondylosis) due to aging.  Bone spurs that may develop near the cervical nerves. What are the signs or symptoms? Symptoms of this condition include:  Pain that runs from the neck to the arm and hand. The pain can be severe or irritating. It may be worse when the neck is moved.  Numbness or weakness in the affected arm and hand. How is this diagnosed? This condition may be diagnosed based on symptoms, medical history, and a physical exam. You may also have tests, including:  X-rays.  CT scan.  MRI.  Electromyogram (EMG).  Nerve conduction tests. How is this treated? In many cases, treatment is not needed for this condition. With rest, the condition usually gets better over time. If treatment is needed, options may include:  Wearing a soft neck collar for short periods of time.  Physical therapy to strengthen your neck muscles.  Medicines, such as NSAIDs, oral corticosteroids, or spinal injections.  Surgery. This may be needed if other treatments do not help. Various types of surgery may be done depending on the cause of your problems. Follow these instructions at home: Managing  pain  Take over-the-counter and prescription medicines only as told by your health care provider.  If directed, apply ice to the affected area.  Put ice in a plastic bag.  Place a towel between your skin and the bag.  Leave the ice on for 20 minutes, 2-3 times per day.  If ice does not help, you can try using heat. Take a warm shower or warm bath, or use a heat pack as told by your health care provider.  Try a gentle neck and shoulder massage to help relieve symptoms. Activity  Rest as needed.  Follow instructions from your health care provider about any restrictions on activities.  Do stretching and strengthening exercises as told by your health care provider or physical therapist. General instructions  If you were given a soft collar, wear it as told by your health care provider.  Use a flat pillow when you sleep.  Keep all follow-up visits as told by your health care provider. This is important. Contact a health care provider if:  Your condition does not improve with treatment. Get help right away if:  Your pain gets much worse and cannot be controlled with medicines.  You have weakness or numbness in your hand, arm, face, or leg.  You have a high fever.  You have a stiff, rigid neck.  You lose control of your bowels or your bladder (have incontinence).  You have trouble with walking, balance, or speaking. This information is not intended to replace advice given to you by your health care provider. Make sure you discuss any questions you have with your health care provider. Document Released: 11/20/2000 Document Revised: 08/03/2015 Document Reviewed: 04/21/2014  2017 [employer]

## 2016-04-03 NOTE — Progress Notes (Signed)
Complete Physical  Assessment and Plan: Essential hypertension - continue medications, DASH diet, exercise and monitor at home. Call if greater than 130/80. - CBC with Differential/Platelet - BASIC METABOLIC PANEL WITH GFR - Hepatic function panel - TSH - Urinalysis, Routine w reflex microscopic (not at [facility]) - Microalbumin / creatinine urine ratio  Atherosclerosis of native coronary artery of native heart without angina pectoris Control blood pressure, cholesterol, glucose, increase exercise.  Continue cardio follow up   Carotid stenosis, bilateral Control blood pressure, cholesterol, glucose, increase exercise.   Type 2 diabetes mellitus with other circulatory complication [facility]) Discussed general issues about diabetes pathophysiology and management., Educational material distributed., Suggested low cholesterol diet., Encouraged aerobic exercise., Discussed foot care., Reminded to get yearly retinal exam. - Hemoglobin A1c   Hyperlipidemia -continue medications, check lipids, decrease fatty foods, increase activity.  - Lipid panel   Anemia, unspecified anemia type - monitor, continue iron supp with Vitamin C and increase green leafy veggies   Medication management - Magnesium   Vitamin D deficiency - VITAMIN D 25 Hydroxy (Vit-D Deficiency, Fractures)   Encounter for general adult medical examination with abnormal findings  Diabetic polyneuropathy associated with type 2 diabetes mellitus (HCC) -     Hemoglobin A1c  Type 2 diabetes mellitus with stage 1 chronic kidney disease, without long-term current use of insulin (HCC) -     Hemoglobin A1c  Pain in both knees, unspecified chronicity -     Uric acid - continue voltern - continue follow up Dr. Berenice Primas  Neck pain -     Ambulatory referral to Orthopedics - continue voltern - declines PT at this time, follow up ortho for eval   Discussed med's effects and SE's. Screening labs and tests as requested with regular  follow-up as recommended.  HPI Patient presents for a complete physical.    His blood pressure has been controlled at home, today their BP is BP: 132/80 He does workout, walks. He denies chest pain, shortness of breath, dizziness.  He has seen Dr. Percival Spanish, has follow up in Dec, and doing well, he had a CABG in 2008, on bASA, and lopressor.  He has been having left neck/shoulder pain, painful with palpation and movement, has neck pain, some numbness right hand 4 and 5th digit.  He is on cholesterol medication, he is off crestor and on pravastatin daily, has some myalgias. His cholesterol is at goal. The cholesterol last visit was:   Lab Results  Component Value Date   CHOL 140 01/02/2016   HDL 33 (L) 01/02/2016   LDLCALC 92 01/02/2016   TRIG 76 01/02/2016   CHOLHDL 4.2 01/02/2016   He has had diabetes since 2000.  He has been working on diet and exercise for diabetes with CAD, he is on bASA, MF and amaryl once a day, will occ get low sugar, lowest is 70, and denies paresthesia of the feet, polydipsia and polyuria. Last A1C in the office was:  Lab Results  Component Value Date   HGBA1C 7.8 (H) 01/02/2016   Lab Results  Component Value Date   GFRNONAA 82 01/02/2016   Patient is on Vitamin D supplement.   Lab Results  Component Value Date   VD25OH 37 09/08/2015     He has been having bilateral knee pain, has been having steroid injections with Dr. Berenice Primas that have helped and has been able to walk.   Current Medications:  Current Outpatient Prescriptions on File Prior to Visit  Medication Sig Dispense Refill  .  aspirin 81 MG tablet Take 81 mg by mouth daily.    . diclofenac (VOLTAREN) 75 MG EC tablet Take 75 mg by mouth 2 (two) times daily.    [name] glimepiride (AMARYL) 4 MG tablet TAKE 1 TABLET (4 MG TOTAL) BY MOUTH 2 (TWO) TIMES DAILY. 60 tablet 4  . metFORMIN (GLUCOPHAGE) 500 MG tablet TAKE 2 TABLETS BY MOUTH EVERY MORNING AND 2 EVERY EVENING 120 tablet 2  . metoprolol tartrate  (LOPRESSOR) 25 MG tablet TAKE 1 TABLET (25 MG TOTAL) BY MOUTH 2 (TWO) TIMES DAILY. 60 tablet 6  . Multiple Vitamin (MULTIVITAMIN) tablet Take 1 tablet by mouth daily.    . pravastatin (PRAVACHOL) 80 MG tablet Take 1 tablet (80 mg total) by mouth every evening. NEED OV. 30 tablet 0   No current facility-administered medications on file prior to visit.    Health Maintenance:  Immunization History  Administered Date(s) Administered  . DT 02/07/2015  . Influenza Whole 12/10/2012  . Influenza, High Dose Seasonal PF 02/07/2015, 01/02/2016  . Influenza-Unspecified 12/31/2013  . Pneumococcal Conjugate-13 02/02/2014  . Pneumococcal Polysaccharide-23 04/13/2003  . Td 04/18/2004   Tetanus:2016 Pneumovax: 2005 Prevnar 13: 2015 Flu vaccine:2017 Zostavax: declines  DEXA: declines Colonoscopy: 2014 due 2024 EGD: N/A Dr. Clydene Laming eye doctor - jan 2017, getting another glaucoma test in 6 months Echo 2009 MRI 2009 US carotid 2016  Patient Care Team: Unk Pinto, MD as PCP - General (Internal Medicine) Jenel Lucks. Clydene Laming, MD as Consulting Physician (Ophthalmology) Minus Breeding, MD as Consulting Physician (Cardiology) Clarene Essex, MD as Consulting Physician (Gastroenterology) Berenice Primas, MD as Referring Physician (Orthopedic Surgery)  Medical History:  Past Medical History:  Diagnosis Date  . Anemia   . Carotid artery stenosis    60 %-80 %  . Coronary artery disease    s/p CABG in 2008  . Diabetes mellitus type II   . Dyslipidemia   . Hyperlipidemia   . Hypertension   . Vitamin D deficiency    Allergies Allergies  Allergen Reactions  . Statins Other (See Comments)    Elevated LFT's    SURGICAL HISTORY He  has a past surgical history that includes Coronary artery bypass graft (2008). FAMILY HISTORY His family history includes Diabetes in his sister; Heart attack in his father; Heart disease in his brother and father; Hyperlipidemia in his father; Hypertension in his  father. SOCIAL HISTORY He  reports that he quit smoking about 20 years ago. He has never used smokeless tobacco. He reports that he does not drink alcohol or use drugs.  Review of Systems  Constitutional: Negative.   HENT: Negative.   Respiratory: Negative.   Cardiovascular: Negative for chest pain, palpitations, orthopnea, claudication, leg swelling and PND.  Gastrointestinal: Negative.   Genitourinary: Negative.   Musculoskeletal: Positive for joint pain and neck pain (with bilateral trap pain, intermittent, worse with movement/palpitations). Negative for back pain, falls and myalgias.  Skin: Negative.   Neurological: Positive for tingling (right hand 4th and 5th digit off and on). Negative for dizziness, tremors, sensory change, speech change, focal weakness, seizures, loss of consciousness and headaches.  Psychiatric/Behavioral: Negative.      Physical Exam: Estimated body mass index is 25.08 kg/m as calculated from the following:   Height as of this encounter: 5' 4.5" (1.638 m).   Weight as of this encounter: 148 lb 6.4 oz (67.3 kg). BP 132/80   Pulse 82   Temp 97.9 F (36.6 C)   Resp 16   Ht 5'  4.5" (1.638 m)   Wt 148 lb 6.4 oz (67.3 kg)   SpO2 98%   BMI 25.08 kg/m  General Appearance: Well nourished, in no apparent distress.  Eyes: PERRLA, EOMs, conjunctiva no swelling or erythema, normal fundi and vessels.  Sinuses: No Frontal/maxillary tenderness  ENT/Mouth: Ext aud canals clear, normal light reflex with TMs without erythema, bulging. Good dentition. No erythema, swelling, or exudate on post pharynx. Tonsils not swollen or erythematous. Hearing normal.  Neck: Supple, thyroid normal. No bruits  Respiratory: Respiratory effort normal, BS equal bilaterally without rales, rhonchi, wheezing or stridor.  Cardio: RRR with systolic murmur RSB without rubs or gallops. Decreased hair bilateral legs, decrease TP/DP pulses, good bilateral sensation, without edema or ulcers.   Chest: symmetric, with normal excursions and percussion.  Abdomen: Soft, nontender, no guarding, rebound, hernias, masses, or organomegaly. .  Lymphatics: Non tender without lymphadenopathy.  Genitourinary: defer Musculoskeletal: Full ROM all peripheral extremities,5/5 strength, and normal gait, nontender C7, + tender traps bilateral arms, good distal neurovascular bilateral arms.   Skin: Warm, dry without rashes, lesions, ecchymosis. Neuro: Cranial nerves intact, reflexes equal bilaterally. Normal muscle tone, no cerebellar symptoms. Sensation intact.  Psych: Awake and oriented X 3, normal affect, Insight and Judgment appropriate.   EKG: defer cardio AORTA SCAN: defer  [name] 9:16 AM [facility]

## 2016-04-04 LAB — HEMOGLOBIN A1C
HEMOGLOBIN A1C: 9.6 % — AB (ref <5.7)
Mean Plasma Glucose: 229 mg/dL

## 2016-04-04 LAB — VITAMIN D 25 HYDROXY (VIT D DEFICIENCY, FRACTURES): VIT D 25 HYDROXY: 35 ng/mL (ref 30–100)

## 2016-04-04 NOTE — Progress Notes (Signed)
Pt aware of lab results & instructions & voiced understanding of those results. A message was sent to front office for DM teaching.

## 2016-04-19 ENCOUNTER — Other Ambulatory Visit: Payer: Self-pay | Admitting: Cardiology

## 2016-04-19 MED ORDER — PRAVASTATIN SODIUM 80 MG PO TABS: 80.0000 mg | ORAL_TABLET | Freq: Every evening | ORAL | 0 refills | Status: DC

## 2016-04-19 NOTE — Telephone Encounter (Signed)
Rx(s) sent to pharmacy electronically.  

## 2016-04-22 ENCOUNTER — Other Ambulatory Visit: Payer: Self-pay | Admitting: *Deleted

## 2016-04-22 MED ORDER — GLIMEPIRIDE 4 MG PO TABS: ORAL_TABLET | ORAL | 0 refills | Status: DC

## 2016-04-23 ENCOUNTER — Other Ambulatory Visit: Payer: Self-pay

## 2016-04-23 ENCOUNTER — Other Ambulatory Visit: Payer: Self-pay | Admitting: *Deleted

## 2016-04-23 MED ORDER — METFORMIN HCL 500 MG PO TABS: ORAL_TABLET | ORAL | 2 refills | Status: DC

## 2016-04-23 MED ORDER — PRAVASTATIN SODIUM 80 MG PO TABS: 80.0000 mg | ORAL_TABLET | Freq: Every evening | ORAL | 0 refills | Status: DC

## 2016-04-25 ENCOUNTER — Ambulatory Visit (INDEPENDENT_AMBULATORY_CARE_PROVIDER_SITE_OTHER): Payer: Medicare Other | Admitting: Physician Assistant

## 2016-04-25 ENCOUNTER — Encounter: Payer: Self-pay | Admitting: Physician Assistant

## 2016-04-25 VITALS — BP 140/80 | HR 93 | Temp 97.5°F | Resp 16 | Ht 61.0 in | Wt 151.0 lb

## 2016-04-25 DIAGNOSIS — J0140 Acute pansinusitis, unspecified: Secondary | ICD-10-CM | POA: Diagnosis not present

## 2016-04-25 MED ORDER — AZITHROMYCIN 250 MG PO TABS: ORAL_TABLET | ORAL | 1 refills | Status: AC

## 2016-04-25 NOTE — Progress Notes (Signed)
   Subjective:    Patient ID: William Haney, male    DOB: [birth year], 66 y.o.   MRN: [id number]  HPI 66 y.o. M with history of DM presents with cold symptoms x 1 week. He has been taking tylenol cold and flu, sudafed, advil, vicks syrup without help. He has had fever, chills, sinus congestion, cough with yellow mucus and wheezing. No SOB, no CP.   Blood pressure 140/80, pulse 93, temperature 97.5 F (36.4 C), resp. rate 16, height 5\' 1"  (1.549 m), weight 151 lb (68.5 kg), SpO2 98 %.  Medications Current Outpatient Prescriptions on File Prior to Visit  Medication Sig  . aspirin 81 MG tablet Take 81 mg by mouth daily.  . diclofenac (VOLTAREN) 75 MG EC tablet Take 75 mg by mouth 2 (two) times daily.  [name] glimepiride (AMARYL) 4 MG tablet TAKE 1 TABLET (4 MG TOTAL) BY MOUTH 2 (TWO) TIMES DAILY.  . metFORMIN (GLUCOPHAGE) 500 MG tablet TAKE 2 TABLETS BY MOUTH EVERY MORNING AND 2 EVERY EVENING  . metoprolol tartrate (LOPRESSOR) 25 MG tablet TAKE 1 TABLET (25 MG TOTAL) BY MOUTH 2 (TWO) TIMES DAILY.  . Multiple Vitamin (MULTIVITAMIN) tablet Take 1 tablet by mouth daily.  . pravastatin (PRAVACHOL) 80 MG tablet Take 1 tablet (80 mg total) by mouth every evening. <PLEASE MAKE APPOINTMENT FOR REFILLS>   No current facility-administered medications on file prior to visit.     Problem list He has Hyperlipidemia; Anemia; Coronary atherosclerosis; Carotid stenosis; Diabetes mellitus with circulatory complication ([place]); Essential hypertension; Vitamin D deficiency; Medication management; Encounter for Medicare annual wellness exam; Neuropathy, diabetic ([place]); DM type 2 (diabetes mellitus, type 2) ([place]); and Poor compliance on his problem list.   Review of Systems  Constitutional: Negative for chills, diaphoresis and fever.  HENT: Positive for congestion, postnasal drip, rhinorrhea, sinus pressure and sore throat. Negative for ear pain, sneezing, trouble swallowing and voice change.   Eyes: Negative.    Respiratory: Positive for cough and wheezing. Negative for chest tightness and shortness of breath.   Cardiovascular: Negative.   Gastrointestinal: Negative.   Genitourinary: Negative.   Musculoskeletal: Negative.  Negative for neck pain.  Neurological: Negative.  Negative for headaches.       Objective:   Physical Exam  Constitutional: He is oriented to person, place, and time. He appears well-developed and well-nourished.  HENT:  Head: Normocephalic and atraumatic.  Right Ear: External ear normal.  Left Ear: External ear normal.  Nose: Right sinus exhibits maxillary sinus tenderness. Left sinus exhibits maxillary sinus tenderness.  Mouth/Throat: Oropharynx is clear and moist.  Eyes: Conjunctivae are normal. Pupils are equal, round, and reactive to light.  Neck: Normal range of motion. Neck supple.  Cardiovascular: Normal rate, regular rhythm and normal heart sounds.   No murmur heard. Pulmonary/Chest: Effort normal. No respiratory distress. He has wheezes (worse LLQ). He has no rales. He exhibits no tenderness.  Abdominal: Soft. Bowel sounds are normal.  Lymphadenopathy:    He has no cervical adenopathy.  Neurological: He is alert and oriented to person, place, and time.  Skin: Skin is warm and dry.      Assessment & Plan:  1. Acute pansinusitis, recurrence not specified Zpak, continue allergy pill, cough syrup, inhaler given with instructions, if not better or worse call the office or go to ER

## 2016-04-25 NOTE — Patient Instructions (Signed)
Can do samples steroid inhaler if do not tolerate oral steroids, do 1 puff twice a day and wash mouth out afterwards to avoid yeast.   HOW TO TREAT VIRAL COUGH AND COLD SYMPTOMS:  -Symptoms usually last at least 1 week with the worst symptoms being around day 4.  - colds usually start with a sore throat and end with a cough, and the cough can take 2 weeks to get better.  -No antibiotics are needed for colds, flu, sore throats, cough, bronchitis UNLESS symptoms are longer than 7 days OR if you are getting better then get drastically worse.  -There are a lot of combination medications (Dayquil, Nyquil, Vicks 44, tyelnol cold and sinus, ETC). Please look at the ingredients on the back so that you are treating the correct symptoms and not doubling up on medications/ingredients.    Medicines you can use  Nasal congestion  - pseudoephedrine (Sudafed)- behind the counter, do not use if you have high blood pressure, medicine that have -D in them.  - phenylephrine (Sudafed PE) -Dextormethorphan + chlorpheniramine (Coridcidin HBP)- okay if you have high blood pressure -Oxymetazoline (Afrin) nasal spray- LIMIT to 3 days -Saline nasal spray -Neti pot (used distilled or bottled water)  Ear pain/congestion  -pseudoephedrine (sudafed) - Nasonex/flonase nasal spray  Fever  -Acetaminophen (Tyelnol) -Ibuprofen (Advil, motrin, aleve)  Sore Throat  -Acetaminophen (Tyelnol) -Ibuprofen (Advil, motrin, aleve) -Drink a lot of water -Gargle with salt water - Rest your voice (don't talk) -Throat sprays -Cough drops  Body Aches  -Acetaminophen (Tyelnol) -Ibuprofen (Advil, motrin, aleve)  Headache  -Acetaminophen (Tyelnol) -Ibuprofen (Advil, motrin, aleve) - Exedrin, Exedrin Migraine  Allergy symptoms (cough, sneeze, runny nose, itchy eyes) -Claritin or loratadine cheapest but likely the weakest  -Zyrtec or certizine at night because it can make you sleepy -The strongest is allegra or  fexafinadine  Cheapest at [employer], [employer], [employer]  Cough  -Dextromethorphan (Delsym)- medicine that has DM in it -Guafenesin (Mucinex/Robitussin) - cough drops - drink lots of water  Chest Congestion  -Guafenesin (Mucinex/Robitussin)  Red Itchy Eyes  - Naphcon-A  Upset Stomach  - Bland diet (nothing spicy, greasy, fried, and high acid foods like tomatoes, oranges, berries) -OKAY- cereal, bread, soup, crackers, rice -Eat smaller more frequent meals -reduce caffeine, no alcohol -Loperamide (Imodium-AD) if diarrhea -Prevacid for heart burn  General health when sick  -Hydration -wash your hands frequently -keep surfaces clean -change pillow cases and sheets often -Get fresh air but do not exercise strenuously -Vitamin D, double up on it - Vitamin C -Zinc

## 2016-04-30 ENCOUNTER — Telehealth: Payer: Self-pay | Admitting: Physician Assistant

## 2016-04-30 MED ORDER — PROMETHAZINE-CODEINE 6.25-10 MG/5ML PO SYRP: 5.0000 mL | ORAL_SOLUTION | Freq: Four times a day (QID) | ORAL | 0 refills | Status: DC | PRN

## 2016-04-30 MED ORDER — PREDNISONE 20 MG PO TABS: ORAL_TABLET | ORAL | 0 refills | Status: DC

## 2016-04-30 NOTE — Telephone Encounter (Signed)
Pt states he will like to try the cough syrup & prednisone if that will help him. He also states that the cough keeps him up all night.  Pt agreed & hung up.

## 2016-04-30 NOTE — Telephone Encounter (Signed)
Cough syrup was called into pharmacy on 20 th Feb 2018 @ 5:26pm by dd

## 2016-04-30 NOTE — Telephone Encounter (Signed)
Patient seen 5 days a go for cold, given zpak pack.  Calling today stating that still with lingering mucus/cough, wants more ABX  Explain to patient that zpak is in system x 10 days, that cough is ALWAYS last thing to go.  Infection can be gone but inflammation can stay for up to 2 weeks, make sure he is on allergy pill, we can send in cough syrup or prednisone to help. Does not need ABX, can call Monday if not better/worse.

## 2016-04-30 NOTE — Addendum Note (Signed)
Addended by: Vladimir Crofts on: 04/30/2016 05:23 PM   Modules accepted: Orders

## 2016-05-15 ENCOUNTER — Other Ambulatory Visit: Payer: Self-pay | Admitting: Cardiology

## 2016-05-15 ENCOUNTER — Other Ambulatory Visit: Payer: Self-pay | Admitting: Physician Assistant

## 2016-05-21 ENCOUNTER — Other Ambulatory Visit: Payer: Self-pay | Admitting: Cardiology

## 2016-06-15 ENCOUNTER — Other Ambulatory Visit: Payer: Self-pay | Admitting: Cardiology

## 2016-07-02 ENCOUNTER — Other Ambulatory Visit: Payer: Self-pay | Admitting: Cardiology

## 2016-07-02 NOTE — Telephone Encounter (Signed)
Rx request sent to pharmacy.  

## 2016-07-05 ENCOUNTER — Encounter: Payer: Self-pay | Admitting: Internal Medicine

## 2016-07-05 ENCOUNTER — Ambulatory Visit (INDEPENDENT_AMBULATORY_CARE_PROVIDER_SITE_OTHER): Payer: Medicare Other | Admitting: Internal Medicine

## 2016-07-05 VITALS — BP 126/78 | HR 76 | Temp 97.3°F | Resp 16 | Ht 61.0 in | Wt 147.0 lb

## 2016-07-05 DIAGNOSIS — E1129 Type 2 diabetes mellitus with other diabetic kidney complication: Secondary | ICD-10-CM | POA: Diagnosis not present

## 2016-07-05 DIAGNOSIS — E782 Mixed hyperlipidemia: Secondary | ICD-10-CM

## 2016-07-05 DIAGNOSIS — Z79899 Other long term (current) drug therapy: Secondary | ICD-10-CM

## 2016-07-05 DIAGNOSIS — E559 Vitamin D deficiency, unspecified: Secondary | ICD-10-CM | POA: Diagnosis not present

## 2016-07-05 DIAGNOSIS — E1165 Type 2 diabetes mellitus with hyperglycemia: Secondary | ICD-10-CM

## 2016-07-05 DIAGNOSIS — I10 Essential (primary) hypertension: Secondary | ICD-10-CM

## 2016-07-05 LAB — CBC WITH DIFFERENTIAL/PLATELET
BASOS PCT: 1 %
Basophils Absolute: 82 cells/uL (ref 0–200)
EOS PCT: 8 %
Eosinophils Absolute: 656 cells/uL — ABNORMAL HIGH (ref 15–500)
HEMATOCRIT: 36.7 % — AB (ref 38.5–50.0)
Hemoglobin: 11.1 g/dL — ABNORMAL LOW (ref 13.2–17.1)
LYMPHS ABS: 2132 {cells}/uL (ref 850–3900)
Lymphocytes Relative: 26 %
MCH: 19.6 pg — ABNORMAL LOW (ref 27.0–33.0)
MCHC: 30.2 g/dL — ABNORMAL LOW (ref 32.0–36.0)
MCV: 64.8 fL — ABNORMAL LOW (ref 80.0–100.0)
Monocytes Absolute: 902 cells/uL (ref 200–950)
Monocytes Relative: 11 %
Neutro Abs: 4428 cells/uL (ref 1500–7800)
Neutrophils Relative %: 54 %
Platelets: 323 10*3/uL (ref 140–400)
RBC: 5.66 MIL/uL (ref 4.20–5.80)
RDW: 18.2 % — AB (ref 11.0–15.0)
WBC: 8.2 10*3/uL (ref 3.8–10.8)

## 2016-07-05 LAB — LIPID PANEL
CHOLESTEROL: 155 mg/dL (ref <200)
HDL: 37 mg/dL — AB (ref >40)
LDL Cholesterol: 94 mg/dL (ref <100)
Total CHOL/HDL Ratio: 4.2 Ratio (ref <5.0)
Triglycerides: 119 mg/dL (ref <150)
VLDL: 24 mg/dL (ref <30)

## 2016-07-05 LAB — HEPATIC FUNCTION PANEL
ALT: 20 U/L (ref 9–46)
AST: 16 U/L (ref 10–35)
Albumin: 3.9 g/dL (ref 3.6–5.1)
Alkaline Phosphatase: 51 U/L (ref 40–115)
BILIRUBIN DIRECT: 0.1 mg/dL (ref <=0.2)
BILIRUBIN INDIRECT: 0.4 mg/dL (ref 0.2–1.2)
Total Bilirubin: 0.5 mg/dL (ref 0.2–1.2)
Total Protein: 6.5 g/dL (ref 6.1–8.1)

## 2016-07-05 LAB — BASIC METABOLIC PANEL WITH GFR
BUN: 16 mg/dL (ref 7–25)
CO2: 24 mmol/L (ref 20–31)
Calcium: 9.3 mg/dL (ref 8.6–10.3)
Chloride: 98 mmol/L (ref 98–110)
Creat: 1.07 mg/dL (ref 0.70–1.25)
GFR, EST AFRICAN AMERICAN: 82 mL/min (ref >=60)
GFR, EST NON AFRICAN AMERICAN: 71 mL/min (ref >=60)
Glucose, Bld: 168 mg/dL — ABNORMAL HIGH (ref 65–99)
POTASSIUM: 5.6 mmol/L — AB (ref 3.5–5.3)
SODIUM: 133 mmol/L — AB (ref 135–146)

## 2016-07-05 NOTE — Patient Instructions (Signed)

## 2016-07-05 NOTE — Progress Notes (Signed)
This very nice 66 y.o. married arabic man presents for 3 month follow up with Hypertension, ASHD/CABG, Hyperlipidemia, Pre-Diabetes and Vitamin D Deficiency.      Patient is treated for HTN & BP has been controlled at home. Patient underwent CABG in 2008 and is followed by Dr Percival Spanish. Today's BP is at goal - 126/78. Patient has had no complaints of any cardiac type chest pain, palpitations, dyspnea/orthopnea/PND, dizziness, claudication, or dependent edema.     Hyperlipidemia is controlled with diet & meds. Patient denies myalgias or other med SE's. Last Lipids were at goal: Lab Results  Component Value Date   CHOL 136 04/03/2016   HDL 31 (L) 04/03/2016   LDLCALC 85 04/03/2016   TRIG 101 04/03/2016   CHOLHDL 4.4 04/03/2016      Also, the patient has history of T2_NIDDM (2000)  and has had no symptoms of reactive hypoglycemia, diabetic polys, paresthesias or visual blurring.  His compliance has been felt poor as consistent with his last A1c which was not at goal: Lab Results  Component Value Date   HGBA1C 9.6 (H) 04/03/2016      Further, the patient also has history of Vitamin D Deficiency ("37" in Mar 2017) and he does not  supplement vitamin D as recommended numerous times in the past.  Last vitamin D was  still very low: Lab Results  Component Value Date   VD25OH 35 04/03/2016   Current Outpatient Prescriptions on File Prior to Visit  Medication Sig  . aspirin 81 MG tablet Take 81 mg by mouth daily.  . diclofenac (VOLTAREN) 75 MG EC tablet Take 75 mg by mouth 2 (two) times daily.  . enalapril (VASOTEC) 10 MG tablet TAKE 1 TABLET BY MOUTH EVERY DAY  . glimepiride (AMARYL) 4 MG tablet TAKE 1 TABLET (4 MG TOTAL) BY MOUTH 2 (TWO) TIMES DAILY.  . metFORMIN (GLUCOPHAGE) 500 MG tablet TAKE 2 TABLETS BY MOUTH EVERY MORNING AND 2 EVERY EVENING  . metoprolol tartrate (LOPRESSOR) 25 MG tablet TAKE 1 TABLET (25 MG TOTAL) BY MOUTH 2 (TWO) TIMES DAILY.  . Multiple Vitamin (MULTIVITAMIN)  tablet Take 1 tablet by mouth daily.  . pravastatin (PRAVACHOL) 80 MG tablet Take 1 tablet (80 mg total) by mouth every evening. <PLEASE MAKE APPOINTMENT FOR REFILLS>   No current facility-administered medications on file prior to visit.    Allergies  Allergen Reactions  . Statins Other (See Comments)    Elevated LFT's   PMHx:   Past Medical History:  Diagnosis Date  . Anemia   . Carotid artery stenosis    60 %-80 %  . Coronary artery disease    s/p CABG in 2008  . Diabetes mellitus type II   . Dyslipidemia   . Hyperlipidemia   . Hypertension   . Vitamin D deficiency    Immunization History  Administered Date(s) Administered  . DT 02/07/2015  . Influenza Whole 12/10/2012  . Influenza, High Dose Seasonal PF 02/07/2015, 01/02/2016  . Influenza-Unspecified 12/31/2013  . Pneumococcal Conjugate-13 02/02/2014  . Pneumococcal Polysaccharide-23 04/13/2003  . Td 04/18/2004   Past Surgical History:  Procedure Laterality Date  . CORONARY ARTERY BYPASS GRAFT  2008   LIMA to the LAD, saphenous vein graft to diagonal, saphenous vein graft to OM, saphenous vein graft to posterolateral 2008   FHx:    Reviewed / unchanged  SHx:    Reviewed / unchanged  Systems Review:  Constitutional: Denies fever, chills, wt changes, headaches, insomnia, fatigue,  night sweats, change in appetite. Eyes: Denies redness, blurred vision, diplopia, discharge, itchy, watery eyes.  ENT: Denies discharge, congestion, post nasal drip, epistaxis, sore throat, earache, hearing loss, dental pain, tinnitus, vertigo, sinus pain, snoring.  CV: Denies chest pain, palpitations, irregular heartbeat, syncope, dyspnea, diaphoresis, orthopnea, PND, claudication or edema. Respiratory: denies cough, dyspnea, DOE, pleurisy, hoarseness, laryngitis, wheezing.  Gastrointestinal: Denies dysphagia, odynophagia, heartburn, reflux, water brash, abdominal pain or cramps, nausea, vomiting, bloating, diarrhea, constipation,  hematemesis, melena, hematochezia  or hemorrhoids. Genitourinary: Denies dysuria, frequency, urgency, nocturia, hesitancy, discharge, hematuria or flank pain. Musculoskeletal: Denies arthralgias, myalgias, stiffness, jt. swelling, pain, limping or strain/sprain.  Skin: Denies pruritus, rash, hives, warts, acne, eczema or change in skin lesion(s). Neuro: No weakness, tremor, incoordination, spasms, paresthesia or pain. Psychiatric: Denies confusion, memory loss or sensory loss. Endo: Denies change in weight, skin or hair change.  Heme/Lymph: No excessive bleeding, bruising or enlarged lymph nodes.  Physical Exam  BP 126/78   Pulse 76   Temp 97.3 F (36.3 C)   Resp 16   Ht 5\' 1"  (1.549 m)   Wt 147 lb (66.7 kg)   BMI 27.78 kg/m   Appears well nourished, well groomed  and in no distress.  Eyes: PERRLA, EOMs, conjunctiva no swelling or erythema. Sinuses: No frontal/maxillary tenderness ENT/Mouth: EAC's clear, TM's nl w/o erythema, bulging. Nares clear w/o erythema, swelling, exudates. Oropharynx clear without erythema or exudates. Oral hygiene is good. Tongue normal, non obstructing. Hearing intact.  Neck: Supple. Thyroid nl. Car 2+/2+ without bruits, nodes or JVD. Chest: Respirations nl with BS clear & equal w/o rales, rhonchi, wheezing or stridor.  Cor: Heart sounds normal w/ regular rate and rhythm without sig. murmurs, gallops, clicks or rubs. Peripheral pulses normal and equal  without edema.  Abdomen: Soft & bowel sounds normal. Non-tender w/o guarding, rebound, hernias, masses or organomegaly.  Lymphatics: Unremarkable.  Musculoskeletal: Full ROM all peripheral extremities, joint stability, 5/5 strength and normal gait.  Skin: Warm, dry without exposed rashes, lesions or ecchymosis apparent.  Neuro: Cranial nerves intact, reflexes equal bilaterally. Sensory-motor testing grossly intact. Tendon reflexes grossly intact.  Pysch: Alert & oriented x 3.  Insight and judgement nl &  appropriate. No ideations.  Assessment and Plan:  1. Essential hypertension  - Continue medication, monitor blood pressure at home.  - Continue DASH diet. Reminder to go to the ER if any CP,  SOB, nausea, dizziness, severe HA, changes vision/speech,  left arm numbness and tingling and jaw pain.  - CBC with Differential/Platelet - BASIC METABOLIC PANEL WITH GFR - Magnesium - TSH  2. Hyperlipidemia, mixed  - Continue diet/meds, exercise,& lifestyle modifications.  - Continue monitor periodic cholesterol/liver & renal functions   - Hepatic function panel - Lipid panel - TSH  3. Poorly controlled type 2 diabetes mellitus with renal complication (HCC)  - Continue diet, exercise, lifestyle modifications.  - Monitor appropriate labs.  - Hemoglobin A1c - Insulin, random - Glutamic acid decarboxylase auto abs - to r/o LADA(Latent AutoImmune Diabetes of Adults)   4. Vitamin D deficiency  - Continue supplementation. - VITAMIN D 25 Hydroxy  5. Medication management  - CBC with Differential/Platelet - BASIC METABOLIC PANEL WITH GFR - Hepatic function panel - Magnesium - Lipid panel - TSH - Hemoglobin A1c - Insulin, random - VITAMIN D 25 Hydroxy       Discussed  regular exercise, BP monitoring, weight control to achieve/maintain BMI less than 25 and discussed med and SE's. Recommended labs to  assess and monitor clinical status with further disposition pending results of labs. Over 30 minutes of exam, counseling, chart review was performed.

## 2016-07-06 LAB — MAGNESIUM: Magnesium: 1.6 mg/dL (ref 1.5–2.5)

## 2016-07-06 LAB — TSH: TSH: 2.16 mIU/L (ref 0.40–4.50)

## 2016-07-06 LAB — HEMOGLOBIN A1C
HEMOGLOBIN A1C: 8.4 % — AB (ref <5.7)
MEAN PLASMA GLUCOSE: 194 mg/dL

## 2016-07-06 LAB — VITAMIN D 25 HYDROXY (VIT D DEFICIENCY, FRACTURES): VIT D 25 HYDROXY: 33 ng/mL (ref 30–100)

## 2016-07-08 LAB — INSULIN, RANDOM: INSULIN: 4.9 u[IU]/mL (ref 2.0–19.6)

## 2016-07-10 LAB — GLUTAMIC ACID DECARBOXYLASE AUTO ABS

## 2016-07-13 ENCOUNTER — Other Ambulatory Visit: Payer: Self-pay | Admitting: Cardiology

## 2016-07-30 ENCOUNTER — Other Ambulatory Visit: Payer: Self-pay | Admitting: Cardiology

## 2016-07-30 NOTE — Telephone Encounter (Signed)
Rx request sent to pharmacy.  

## 2016-08-12 ENCOUNTER — Other Ambulatory Visit: Payer: Self-pay | Admitting: Internal Medicine

## 2016-08-28 ENCOUNTER — Other Ambulatory Visit: Payer: Self-pay | Admitting: Cardiology

## 2016-09-03 DIAGNOSIS — Z23 Encounter for immunization: Secondary | ICD-10-CM | POA: Diagnosis not present

## 2016-09-23 DIAGNOSIS — H40013 Open angle with borderline findings, low risk, bilateral: Secondary | ICD-10-CM | POA: Diagnosis not present

## 2016-09-23 DIAGNOSIS — H40053 Ocular hypertension, bilateral: Secondary | ICD-10-CM | POA: Diagnosis not present

## 2016-09-23 DIAGNOSIS — E113213 Type 2 diabetes mellitus with mild nonproliferative diabetic retinopathy with macular edema, bilateral: Secondary | ICD-10-CM | POA: Diagnosis not present

## 2016-10-07 NOTE — Progress Notes (Signed)
MEDICARE ANNUAL WELLNESS VISIT AND FOLLOW UP Assessment:   Essential hypertension - continue medications, DASH diet, exercise and monitor at home. Call if greater than 130/80.  -     CBC with Differential/Platelet -     BASIC METABOLIC PANEL WITH GFR -     Hepatic function panel -     TSH  Atherosclerosis of native coronary artery of native heart without angina pectoris Control blood pressure, cholesterol, glucose, increase exercise.  -     Lipid panel -     Hemoglobin A1c  Stenosis of carotid artery, unspecified laterality Control blood pressure, cholesterol, glucose, increase exercise.  -     Lipid panel -     Hemoglobin A1c  Type 2 diabetes mellitus with other circulatory complication, without long-term current use of insulin (HCC) Discussed general issues about diabetes pathophysiology and management., Educational material distributed., Suggested low cholesterol diet., Encouraged aerobic exercise., Discussed foot care., Reminded to get yearly retinal exam. -     Hemoglobin A1c  Diabetic polyneuropathy associated with type 2 diabetes mellitus ([place]) Discussed general issues about diabetes pathophysiology and management., Educational material distributed., Suggested low cholesterol diet., Encouraged aerobic exercise., Discussed foot care., Reminded to get yearly retinal exam. -     Hemoglobin A1c  Type 2 diabetes mellitus with stage 1 chronic kidney disease, without long-term current use of insulin ([place]) Discussed general issues about diabetes pathophysiology and management., Educational material distributed., Suggested low cholesterol diet., Encouraged aerobic exercise., Discussed foot care., Reminded to get yearly retinal exam. -     Hemoglobin A1c  Hyperlipidemia -continue medications, check lipids, decrease fatty foods, increase activity.  -     Lipid panel  Anemia, unspecified type -     CBC with Differential/Platelet  Medication management -     Magnesium  Vitamin D  deficiency  Encounter for Medicare annual wellness exam Due next year  Need for prophylactic vaccination against Streptococcus pneumoniae (pneumococcus) -     Pneumococcal polysaccharide vaccine 23-valent greater than or equal to 2yo subcutaneous/IM  Advanced care planning/counseling discussion Discussed advanced care planning with patient  Future Appointments Date Time Provider [place]  01/08/2017 10:30 AM Unk Pinto, MD [facility] None  04/10/2017 9:00 AM [name], PA-C [facility] None      Plan:   During the course of the visit the patient was educated and counseled about appropriate screening and preventive services including:    Pneumococcal vaccine   Influenza vaccine  Td vaccine  Screening electrocardiogram  Colorectal cancer screening  Diabetes screening  Glaucoma screening  Nutrition counseling    Subjective:  William Haney is a 66 y.o. male who presents for Medicare Annual Wellness Visit and 3 month follow up for HTN, hyperlipidemia, diabetes, and vitamin D Def.   His blood pressure has been controlled at home, today their BP is BP: 138/80 He does not workout.  He denies chest pain, shortness of breath, dizziness.  He has a history of CAD s/p CABG in 2008, follows with Dr. Percival Spanish. He is on cholesterol medication, welchol. .His cholesterol is not at goal. The cholesterol last visit was:   Lab Results  Component Value Date   CHOL 155 07/05/2016   HDL 37 (L) 07/05/2016   LDLCALC 94 07/05/2016   TRIG 119 07/05/2016   CHOLHDL 4.2 07/05/2016   He has been working on diet and exercise for diabetes with history of CAD/PAD ,he is on 81mg  ASA,he is not on ACE/ARB, he is on glimeperide 4mg  once a  day, metformin 2 pills twice a day and admits to some paresthesia's in his feet at times but denies hypoglycemia , polydipsia, polyuria and visual disturbances.  Last A1C in the office was:  Lab Results  Component Value Date   HGBA1C 8.4 (H)  07/05/2016   Lab Results  Component Value Date   GFRNONAA 71 07/05/2016   Patient is on Vitamin D supplement.   Lab Results  Component Value Date   VD25OH 33 07/05/2016     He follows with Dr. Berenice Primas for knee pain, has had bilateral shots this AM. .  BMI is Body mass index is 27.96 kg/m., he is working on diet and exercise. Wt Readings from Last 3 Encounters:  10/08/16 148 lb (67.1 kg)  07/05/16 147 lb (66.7 kg)  04/25/16 151 lb (68.5 kg)    Names of Other Physician/Practitioners you currently use: 1. [facility] here for primary care 2. [facility], Dr. Clydene Laming, eye doctor, last visit July 2018 3. Dr. Burt Knack, dentist, last visit q 2016 Patient Care Team: Unk Pinto, MD as PCP - General (Internal Medicine) Keene Breath., MD as Consulting Physician (Ophthalmology) Minus Breeding, MD as Consulting Physician (Cardiology) Clarene Essex, MD as Consulting Physician (Gastroenterology) Berenice Primas, MD as Referring Physician (Orthopedic Surgery)  Medication Review: Current Outpatient Prescriptions on File Prior to Visit  Medication Sig  . aspirin 81 MG tablet Take 81 mg by mouth daily.  . diclofenac (VOLTAREN) 75 MG EC tablet Take 75 mg by mouth 2 (two) times daily.  . enalapril (VASOTEC) 10 MG tablet TAKE 1 TABLET BY MOUTH EVERY DAY  . glimepiride (AMARYL) 4 MG tablet TAKE 1 TABLET BY MOUTH TWICE A DAY  . metFORMIN (GLUCOPHAGE) 500 MG tablet TAKE 2 TABLETS BY MOUTH EVERY MORNING AND 2 EVERY EVENING  . metoprolol tartrate (LOPRESSOR) 25 MG tablet TAKE 1 TABLET (25 MG TOTAL) BY MOUTH 2 (TWO) TIMES DAILY.  . Multiple Vitamin (MULTIVITAMIN) tablet Take 1 tablet by mouth daily.  . pravastatin (PRAVACHOL) 80 MG tablet Take 1 tablet (80 mg total) by mouth every evening. <PLEASE MAKE APPOINTMENT FOR REFILLS>   No current facility-administered medications on file prior to visit.     Current Problems (verified) Patient Active Problem List    Diagnosis Date Noted  . Poor compliance 09/09/2015  . DM type 2 (diabetes mellitus, type 2) ([place]) 09/08/2015  . Neuropathy, diabetic ([place]) 05/19/2015  . Encounter for Medicare annual wellness exam 09/21/2014  . Essential hypertension 10/11/2013  . Vitamin D deficiency 10/11/2013  . Medication management 10/11/2013  . Diabetes mellitus with circulatory complication ([place]) 03/47/4259  . Hyperlipidemia 03/01/2010  . Anemia 03/01/2010  . Coronary atherosclerosis 03/01/2010  . Carotid stenosis 03/01/2010    Screening Tests Immunization History  Administered Date(s) Administered  . DT 02/07/2015  . Influenza Whole 12/10/2012  . Influenza, High Dose Seasonal PF 02/07/2015, 01/02/2016  . Influenza-Unspecified 12/31/2013  . Pneumococcal Conjugate-13 02/02/2014  . Pneumococcal Polysaccharide-23 04/13/2003, 10/08/2016  . Td 04/18/2004   Preventative care: Tetanus:2016 Pneumovax: TODAY Prevnar 13: 2015 Flu vaccine:2017 Zostavax: declines  DEXA: declines Colonoscopy: 2014 due 2024 EGD:N/A Echo 01/2008 Stress test 2013 MRI head 03/2007 Carotid US- 02/2015 CXR 01/2013  History reviewed: allergies, current medications, past family history, past medical history, past social history, past surgical history and problem list  Allergies Allergies  Allergen Reactions  . Statins Other (See Comments)    Elevated LFT's    SURGICAL HISTORY He  has a past surgical  history that includes Coronary artery bypass graft (2008). FAMILY HISTORY His family history includes Diabetes in his sister; Heart attack in his father; Heart disease in his brother and father; Hyperlipidemia in his father; Hypertension in his father. SOCIAL HISTORY He  reports that he quit smoking about 21 years ago. He has never used smokeless tobacco. He reports that he does not drink alcohol or use drugs.  MEDICARE WELLNESS OBJECTIVES: Physical activity: Current Exercise Habits: Home exercise routine, Time (Minutes): 20,  Intensity: Mild, Exercise limited by: None identified Cardiac risk factors: Cardiac Risk Factors include: advanced age (>33men, >81 women);diabetes mellitus;dyslipidemia;hypertension Depression/mood screen:   Depression screen [facility] 2/9 10/08/2016  Decreased Interest 0  Down, Depressed, Hopeless 0  PHQ - 2 Score 0    ADLs:  In your present state of health, do you have any difficulty performing the following activities: 10/08/2016 07/05/2016  Hearing? N N  Vision? N N  Difficulty concentrating or making decisions? N N  Walking or climbing stairs? N N  Dressing or bathing? N N  Doing errands, shopping? N N  Some recent data might be hidden     Cognitive Testing  Alert? Yes  Normal Appearance?Yes  Oriented to person? Yes  Place? Yes   Time? Yes  Recall of three objects?  Yes  Can perform simple calculations? Yes  Displays appropriate judgment?Yes  Can read the correct time from a watch face?Yes  EOL planning: Does Patient Have a Medical Advance Directive?: Yes Type of Advance Directive: Healthcare Power of Attorney, Living will [place] in Chart?: No - copy requested   Objective:   Blood pressure 138/80, pulse 71, temperature (!) 97.5 F (36.4 C), resp. rate 16, height 5\' 1"  (1.549 m), weight 148 lb (67.1 kg), SpO2 98 %. Body mass index is 27.96 kg/m.  General appearance: alert, no distress, WD/WN, male HEENT: normocephalic, sclerae anicteric, TMs pearly, nares patent, no discharge or erythema, pharynx normal Oral cavity: MMM, no lesions Neck: supple, no lymphadenopathy, no thyromegaly, no masses Heart: RRR, normal S1, S2, no murmurs Lungs: CTA bilaterally, no wheezes, rhonchi, or rales Abdomen: +bs, soft, non tender, non distended, no masses, no hepatomegaly, no splenomegaly Musculoskeletal: nontender, no swelling, no obvious deformity Extremities: no edema, no cyanosis, no clubbing Pulses: 2+ symmetric, upper and lower extremities, normal cap  refill Neurological: alert, oriented x 3, CN2-12 intact, strength normal upper extremities and lower extremities, sensation abnormal bilateral feet, DTRs 2+ throughout, no cerebellar signs, gait normal Psychiatric: normal affect, behavior normal, pleasant   Medicare Attestation I have personally reviewed: The patient's medical and social history Their use of alcohol, tobacco or illicit drugs Their current medications and supplements The patient's functional ability including ADLs,fall risks, home safety risks, cognitive, and hearing and visual impairment Diet and physical activities Evidence for depression or mood disorders  The patient's weight, height, BMI, and visual acuity have been recorded in the chart.  I have made referrals, counseling, and provided education to the patient based on review of the above and I have provided the patient with a written personalized care plan for preventive services.     [name], PA-C   10/08/2016

## 2016-10-08 ENCOUNTER — Encounter: Payer: Self-pay | Admitting: Physician Assistant

## 2016-10-08 ENCOUNTER — Ambulatory Visit (INDEPENDENT_AMBULATORY_CARE_PROVIDER_SITE_OTHER): Payer: Medicare Other | Admitting: Physician Assistant

## 2016-10-08 VITALS — BP 138/80 | HR 71 | Temp 97.5°F | Resp 16 | Ht 61.0 in | Wt 148.0 lb

## 2016-10-08 DIAGNOSIS — E559 Vitamin D deficiency, unspecified: Secondary | ICD-10-CM | POA: Diagnosis not present

## 2016-10-08 DIAGNOSIS — Z79899 Other long term (current) drug therapy: Secondary | ICD-10-CM | POA: Diagnosis not present

## 2016-10-08 DIAGNOSIS — D649 Anemia, unspecified: Secondary | ICD-10-CM | POA: Diagnosis not present

## 2016-10-08 DIAGNOSIS — Z7189 Other specified counseling: Secondary | ICD-10-CM | POA: Diagnosis not present

## 2016-10-08 DIAGNOSIS — I6529 Occlusion and stenosis of unspecified carotid artery: Secondary | ICD-10-CM | POA: Diagnosis not present

## 2016-10-08 DIAGNOSIS — E1159 Type 2 diabetes mellitus with other circulatory complications: Secondary | ICD-10-CM | POA: Diagnosis not present

## 2016-10-08 DIAGNOSIS — I2510 Atherosclerotic heart disease of native coronary artery without angina pectoris: Secondary | ICD-10-CM | POA: Diagnosis not present

## 2016-10-08 DIAGNOSIS — R6889 Other general symptoms and signs: Secondary | ICD-10-CM

## 2016-10-08 DIAGNOSIS — E782 Mixed hyperlipidemia: Secondary | ICD-10-CM

## 2016-10-08 DIAGNOSIS — M1712 Unilateral primary osteoarthritis, left knee: Secondary | ICD-10-CM | POA: Diagnosis not present

## 2016-10-08 DIAGNOSIS — Z0000 Encounter for general adult medical examination without abnormal findings: Secondary | ICD-10-CM

## 2016-10-08 DIAGNOSIS — Z23 Encounter for immunization: Secondary | ICD-10-CM | POA: Diagnosis not present

## 2016-10-08 DIAGNOSIS — Z0001 Encounter for general adult medical examination with abnormal findings: Secondary | ICD-10-CM

## 2016-10-08 DIAGNOSIS — I10 Essential (primary) hypertension: Secondary | ICD-10-CM | POA: Diagnosis not present

## 2016-10-08 DIAGNOSIS — E1142 Type 2 diabetes mellitus with diabetic polyneuropathy: Secondary | ICD-10-CM | POA: Diagnosis not present

## 2016-10-08 DIAGNOSIS — E1122 Type 2 diabetes mellitus with diabetic chronic kidney disease: Secondary | ICD-10-CM | POA: Diagnosis not present

## 2016-10-08 DIAGNOSIS — N181 Chronic kidney disease, stage 1: Secondary | ICD-10-CM

## 2016-10-08 DIAGNOSIS — M1711 Unilateral primary osteoarthritis, right knee: Secondary | ICD-10-CM | POA: Diagnosis not present

## 2016-10-08 LAB — CBC WITH DIFFERENTIAL/PLATELET
Basophils Absolute: 0 {cells}/uL (ref 0–200)
Basophils Relative: 0 %
Eosinophils Absolute: 122 {cells}/uL (ref 15–500)
Eosinophils Relative: 1 %
HCT: 35.6 % — ABNORMAL LOW (ref 38.5–50.0)
Hemoglobin: 11.1 g/dL — ABNORMAL LOW (ref 13.2–17.1)
Lymphocytes Relative: 7 %
Lymphs Abs: 854 {cells}/uL (ref 850–3900)
MCH: 20.8 pg — ABNORMAL LOW (ref 27.0–33.0)
MCHC: 31.2 g/dL — ABNORMAL LOW (ref 32.0–36.0)
MCV: 66.7 fL — ABNORMAL LOW (ref 80.0–100.0)
Monocytes Absolute: 122 {cells}/uL — ABNORMAL LOW (ref 200–950)
Monocytes Relative: 1 %
Neutro Abs: 11102 {cells}/uL — ABNORMAL HIGH (ref 1500–7800)
Neutrophils Relative %: 91 %
Platelets: 348 K/uL (ref 140–400)
RBC: 5.34 MIL/uL (ref 4.20–5.80)
RDW: 17.2 % — ABNORMAL HIGH (ref 11.0–15.0)
WBC: 12.2 K/uL — ABNORMAL HIGH (ref 3.8–10.8)

## 2016-10-08 LAB — TSH: TSH: 1.13 m[IU]/L (ref 0.40–4.50)

## 2016-10-08 NOTE — Patient Instructions (Signed)
Drink 80-100 oz a day of water, measure it out, TRY TO INCREASE WATER Eat 3 meals a day, have to do breakfast, eat protein- hard boiled eggs, protein bar like nature [place] protein bar, greek yogurt like oikos triple zero, chobani 100, or light n fit greek  BRING IN LOG OF YOUR SUGARS TAKE ONCE OR TWICE A DAY   Your A1C is a measure of your sugar over the past 3 months and is not affected by what you have eaten over the past few days. Diabetes increases your chances of stroke and heart attack over 300 % and is the leading cause of blindness and kidney failure in the [place]. Please make sure you decrease bad carbs like white bread, white rice, potatoes, corn, soft drinks, pasta, cereals, refined sugars, sweet tea, dried fruits, and fruit juice. Good carbs are okay to eat in moderation like sweet potatoes, brown rice, whole grain pasta/bread, most fruit (except dried fruit) and you can eat as many veggies as you want.   Greater than 6.5 is considered diabetic. Between 6.4 and 5.7 is prediabetic If your A1C is less than 5.7 you are NOT diabetic.  Targets for Glucose Readings: Time of Check Target for patients WITHOUT Diabetes Target for DIABETICS  Before Meals Less than 100  less than 150  Two hours after meals Less than 200  Less than 250     We want weight loss that will last so you should lose 1-2 pounds a week.  THAT IS IT! Please pick THREE things a month to change. Once it is a habit check off the item. Then pick another three items off the list to become habits.  If you are already doing a habit on the list GREAT!  Cross that item off! o Don't drink your calories. Ie, alcohol, soda, fruit juice, and sweet tea.  o Drink more water. Drink a glass when you feel hungry or before each meal.  o Eat breakfast - Complex carb and protein (like[place] light and fit yogurt, oatmeal, fruit, eggs, [place] bacon). o Measure your cereal.  Eat no more than one cup a day. (ie [place]) o Eat an apple a  day. o Add a vegetable a day. o Try a new vegetable a month. o Use Pam! Stop using oil or butter to cook. o Don't finish your plate or use smaller plates. o Share your dessert. o Eat sugar free Jello for dessert or frozen grapes. o Don't eat 2-3 hours before bed. o Switch to whole wheat bread, pasta, and brown rice. o Make healthier choices when you eat out. No fries! o Pick baked chicken, NOT fried. o Don't forget to SLOW DOWN when you eat. It is not going anywhere.  o Take the stairs. o Park far away in the parking lot o [employer] (or weights) for 10 minutes while watching TV. o Walk at work for 10 minutes during break. o Walk outside 1 time a week with your friend, kids, dog, or significant other. o Start a walking group at [place] the mall as much as you can tolerate.  o Keep a food diary. o Weigh yourself daily. o Walk for 15 minutes 3 days per week. o Cook at home more often and eat out less.  If life happens and you go back to old habits, it is okay.  Just start over. You can do it!   If you experience chest pain, get short of breath, or tired during the  exercise, please stop immediately and inform your doctor.      Bad carbs also include fruit juice, alcohol, and sweet tea. These are empty calories that do not signal to your brain that you are full.   Please remember the good carbs are still carbs which convert into sugar. So please measure them out no more than 1/2-1 cup of rice, oatmeal, pasta, and beans  Veggies are however free foods! Pile them on.   Not all fruit is created equal. Please see the list below, the fruit at the bottom is higher in sugars than the fruit at the top. Please avoid all dried fruits.

## 2016-10-09 ENCOUNTER — Other Ambulatory Visit: Payer: Self-pay | Admitting: Physician Assistant

## 2016-10-09 LAB — LIPID PANEL
CHOL/HDL RATIO: 4.3 ratio (ref <5.0)
Cholesterol: 160 mg/dL (ref <200)
HDL: 37 mg/dL — AB (ref >40)
LDL CALC: 99 mg/dL (ref <100)
Triglycerides: 121 mg/dL (ref <150)
VLDL: 24 mg/dL (ref <30)

## 2016-10-09 LAB — HEMOGLOBIN A1C
Hgb A1c MFr Bld: 8.8 % — ABNORMAL HIGH (ref <5.7)
MEAN PLASMA GLUCOSE: 206 mg/dL

## 2016-10-09 LAB — BASIC METABOLIC PANEL WITH GFR
BUN: 13 mg/dL (ref 7–25)
CO2: 21 mmol/L (ref 20–31)
Calcium: 9 mg/dL (ref 8.6–10.3)
Chloride: 98 mmol/L (ref 98–110)
Creat: 0.91 mg/dL (ref 0.70–1.25)
GFR, Est Non African American: 86 mL/min (ref >=60)
GLUCOSE: 247 mg/dL — AB (ref 65–99)
Potassium: 6 mmol/L — ABNORMAL HIGH (ref 3.5–5.3)
Sodium: 130 mmol/L — ABNORMAL LOW (ref 135–146)

## 2016-10-09 LAB — HEPATIC FUNCTION PANEL
ALK PHOS: 60 U/L (ref 40–115)
ALT: 22 U/L (ref 9–46)
AST: 18 U/L (ref 10–35)
Albumin: 4.1 g/dL (ref 3.6–5.1)
Bilirubin, Direct: 0.1 mg/dL (ref <=0.2)
Indirect Bilirubin: 0.4 mg/dL (ref 0.2–1.2)
Total Bilirubin: 0.5 mg/dL (ref 0.2–1.2)
Total Protein: 6.8 g/dL (ref 6.1–8.1)

## 2016-10-09 LAB — MAGNESIUM: Magnesium: 1.6 mg/dL (ref 1.5–2.5)

## 2016-10-09 MED ORDER — AZITHROMYCIN 250 MG PO TABS: ORAL_TABLET | ORAL | 1 refills | Status: AC

## 2016-10-10 NOTE — Progress Notes (Signed)
Pt aware of lab results & voiced understanding of those results. Pt transferred to front to schedule a DM visit.

## 2016-11-21 NOTE — Progress Notes (Deleted)
Diabetes Education and Follow-Up Visit  66 y.o.male presents for diabetic education. He has Diabetes Mellitus type 2:  {Blankmultiple:19197::"with ESRD","with CKD stage 3 GFR 30-59","with CKD stage 4 GFR 15-29","with DM retinopathy","with PVD","with DM skin ulcer","with CAD","with CVD","with peripheral neuropathy","with CKD stage 2 GFR 37-85","YIFOYDX complications"}.  Patient denies {Symptoms; diabetes:14075}. The patient {ACTION; IS/IS AJO:[id number]} on a low dose aspirin at this time.   Last hemoglobin A1c was: Lab Results  Component Value Date   HGBA1C 8.8 (H) 10/08/2016   HGBA1C 8.4 (H) 07/05/2016   HGBA1C 9.6 (H) 04/03/2016    Pt is on a regimen of: {Treatments; meds diabetes oral:16626}  Pt checks his sugars {1-4:31454} x day  Lowest sugar was ***.  He has hypoglycemia awareness.  Highest sugar was ***.  Glucometer:   Exercise:  Patient {DOES NOT does:27190::"does not"} have CKD He {ACTION; IS/IS CNO:[id number]} on ACE/ARB  Lab Results  Component Value Date   GFRAA >89 10/08/2016      Lab Results  Component Value Date   GFRNONAA 86 10/08/2016    Lab Results  Component Value Date   CREATININE 0.91 10/08/2016   BUN 13 10/08/2016   NA 130 (L) 10/08/2016   K 6.0 (H) 10/08/2016   CL 98 10/08/2016   CO2 21 10/08/2016    Lab Results  Component Value Date   MICROALBUR 0.8 04/03/2016     He {ACTION; IS/IS OQH:[id number]} on a Statin.  Lab Results  Component Value Date   CHOL 160 10/08/2016   HDL 37 (L) 10/08/2016   LDLCALC 99 10/08/2016   TRIG 121 10/08/2016   CHOLHDL 4.3 10/08/2016     Problem List has Hyperlipidemia; Anemia; Coronary atherosclerosis; Carotid stenosis; Diabetes mellitus with circulatory complication ([place]); Essential hypertension; Vitamin D deficiency; Medication management; Encounter for Medicare annual wellness exam; Neuropathy, diabetic ([place]); DM type 2 (diabetes mellitus, type 2) ([place]); and Poor compliance on his problem  list.  Medications Mr. Forman had no medications administered during this visit.  ROS  Physical Exam: There were no vitals taken for this visit. There is no height or weight on file to calculate BMI. General Appearance: Well nourished, in no apparent distress. Eyes: PERRLA, EOMs, conjunctiva no swelling or erythema ENT/Mouth: Ext aud canals clear, TMs without erythema, bulging. No erythema, swelling, or exudate on post pharynx.  Tonsils not swollen or erythematous. Hearing normal.  Respiratory: Respiratory effort normal, BS equal bilaterally without rales, rhonchi, wheezing or stridor.  Cardio: RRR with no MRGs. Brisk peripheral pulses without edema.  Abdomen: Soft, + BS.  Non tender, no guarding, rebound, hernias, masses. Musculoskeletal: Full ROM, 5/5 strength, normal gait.  Skin: Warm, dry without rashes, lesions, ecchymosis.  Neuro: Cranial nerves intact. Normal muscle tone, no cerebellar symptoms. Sensation intact.    Plan and Assessment: Diabetes Education: Reviewed 'ABCs' of diabetes management (respective goals in parentheses):  A1C (<7), blood pressure (<130/80), and cholesterol (LDL <70) Eye Exam yearly and Dental Exam every 6 months. Dietary recommendations Physical Activity recommendations - Strongly advised him to start checking sugars at different times of the day - check 2 times a day, rotating checks - given sugar log and advised how to fill it and to bring it at next appt  - given foot care handout and explained the principles  - given instructions for hypoglycemia management    Future Appointments Date Time Provider [place]  11/22/2016 11:30 AM [name], PA-C [facility] None  01/08/2017 10:30 AM Unk Pinto, MD [facility] None  04/10/2017 9:00 AM Silverio Lay,  [name], PA-C [facility] None

## 2016-11-22 ENCOUNTER — Ambulatory Visit: Payer: Self-pay | Admitting: Physician Assistant

## 2016-12-11 NOTE — Progress Notes (Signed)
Diabetes Education and Follow-Up Visit  66 y.o.male presents for diabetic education. He has Diabetes Mellitus type 2:  with peripheral neuropathy.  Patient denies polydipsia, polyuria and visual disturbances. The patient is on a low dose aspirin at this time.   Last hemoglobin A1c was: Lab Results  Component Value Date   HGBA1C 8.8 (H) 10/08/2016   HGBA1C 8.4 (H) 07/05/2016   HGBA1C 9.6 (H) 04/03/2016   Pt is on a regimen of: Glimepiride 4mg  BID Metformin 500mg  4 tabs a day  Pt checks his sugars 2 x day  HE DID NOT BRING IN HIS SUGARS Lowest sugar was 60.AFTERNOON after cutting grass  He has hypoglycemia awareness.  Highest sugar was 210 In the AM 130's  Glucometer: accucheck   Exercise: does push ups/calesthics. Does treadmill 10-15 min.   Patient does not have CKD He is on ACE/ARB   Lab Results  Component Value Date   GFRNONAA 86 10/08/2016    Lab Results  Component Value Date   CREATININE 0.91 10/08/2016   BUN 13 10/08/2016   NA 130 (L) 10/08/2016   K 6.0 (H) 10/08/2016   CL 98 10/08/2016   CO2 21 10/08/2016    Lab Results  Component Value Date   MICROALBUR 0.8 04/03/2016     He is on a Statin.  Lab Results  Component Value Date   CHOL 160 10/08/2016   HDL 37 (L) 10/08/2016   LDLCALC 99 10/08/2016   TRIG 121 10/08/2016   CHOLHDL 4.3 10/08/2016     Problem List has Hyperlipidemia; Anemia; Coronary atherosclerosis; Carotid stenosis; Essential hypertension; Vitamin D deficiency; Medication management; Encounter for Medicare annual wellness exam; Neuropathy, diabetic ([place]); DM type 2 (diabetes mellitus, type 2) ([place]); and Poor compliance on his problem list.  Medications Current Outpatient Prescriptions on File Prior to Visit  Medication Sig  . aspirin 81 MG tablet Take 81 mg by mouth daily.  . diclofenac (VOLTAREN) 75 MG EC tablet Take 75 mg by mouth 2 (two) times daily.  . enalapril (VASOTEC) 10 MG tablet TAKE 1 TABLET BY MOUTH EVERY DAY  .  glimepiride (AMARYL) 4 MG tablet TAKE 1 TABLET BY MOUTH TWICE A DAY  . metFORMIN (GLUCOPHAGE) 500 MG tablet TAKE 2 TABLETS BY MOUTH EVERY MORNING AND 2 EVERY EVENING  . metoprolol tartrate (LOPRESSOR) 25 MG tablet Take 1 tablet (25 mg total) by mouth 2 (two) times daily. NEED OV.  . Multiple Vitamin (MULTIVITAMIN) tablet Take 1 tablet by mouth daily.  . pravastatin (PRAVACHOL) 80 MG tablet Take 1 tablet (80 mg total) by mouth every evening. <PLEASE MAKE APPOINTMENT FOR REFILLS>   No current facility-administered medications on file prior to visit.     Review of Systems  Constitutional: Negative.   HENT: Negative.   Eyes: Negative.   Respiratory: Negative.   Cardiovascular: Negative.   Gastrointestinal: Negative.   Genitourinary: Negative.   Musculoskeletal: Negative.   Skin: Negative.     Physical Exam: Blood pressure 140/80, pulse 88, temperature 97.7 F (36.5 C), resp. rate 14, height 5\' 1"  (1.549 m), weight 141 lb (64 kg), SpO2 98 %. Body mass index is 26.64 kg/m. General Appearance: Well nourished, in no apparent distress. Eyes: PERRLA, EOMs, conjunctiva no swelling or erythema ENT/Mouth: Ext aud canals clear, TMs without erythema, bulging. No erythema, swelling, or exudate on post pharynx.  Tonsils not swollen or erythematous. Hearing normal.  Respiratory: Respiratory effort normal, BS equal bilaterally without rales, rhonchi, wheezing or stridor.  Cardio: RRR with no  MRGs. Brisk peripheral pulses without edema.  Abdomen: Soft, + BS.  Non tender, no guarding, rebound, hernias, masses. Musculoskeletal: Full ROM, 5/5 strength, normal gait.  Skin: Warm, dry without rashes, lesions, ecchymosis.  Neuro: Cranial nerves intact. Normal muscle tone, no cerebellar symptoms. Sensation intact.    Plan and Assessment: Diabetes Education: Reviewed 'ABCs' of diabetes management (respective goals in parentheses):  A1C (<7), blood pressure (<130/80), and cholesterol (LDL <70) Eye Exam  yearly and Dental Exam every 6 months. Dietary recommendations Physical Activity recommendations - Strongly advised him to start checking sugars at different times of the day - check 2 times a day, rotating checks - given sugar log and advised how to fill it and to bring it at next appt  - given foot care handout and explained the principles  - given instructions for hypoglycemia management    Future Appointments Date Time Provider [place]  01/08/2017 10:30 AM Unk Pinto, MD [facility] None  04/10/2017 9:00 AM [name], PA-C [facility] None

## 2016-12-12 ENCOUNTER — Other Ambulatory Visit: Payer: Self-pay | Admitting: Cardiology

## 2016-12-12 NOTE — Telephone Encounter (Signed)
REFILL 

## 2016-12-13 ENCOUNTER — Ambulatory Visit (INDEPENDENT_AMBULATORY_CARE_PROVIDER_SITE_OTHER): Payer: Medicare Other | Admitting: Physician Assistant

## 2016-12-13 ENCOUNTER — Encounter: Payer: Self-pay | Admitting: Physician Assistant

## 2016-12-13 VITALS — BP 140/80 | HR 88 | Temp 97.7°F | Resp 14 | Ht 61.0 in | Wt 141.0 lb

## 2016-12-13 DIAGNOSIS — E782 Mixed hyperlipidemia: Secondary | ICD-10-CM | POA: Diagnosis not present

## 2016-12-13 DIAGNOSIS — I10 Essential (primary) hypertension: Secondary | ICD-10-CM

## 2016-12-13 DIAGNOSIS — E1122 Type 2 diabetes mellitus with diabetic chronic kidney disease: Secondary | ICD-10-CM

## 2016-12-13 DIAGNOSIS — E1142 Type 2 diabetes mellitus with diabetic polyneuropathy: Secondary | ICD-10-CM

## 2016-12-13 DIAGNOSIS — N181 Chronic kidney disease, stage 1: Secondary | ICD-10-CM

## 2016-12-13 DIAGNOSIS — Z23 Encounter for immunization: Secondary | ICD-10-CM

## 2016-12-13 NOTE — Patient Instructions (Addendum)
VERY IMPORTANT FOR YOU TO CHECK YOUR SUGARS TWICE A DAY ONCE IN THE MORNING BEFORE FOOD AND ONCE 2 HOURS AFTER EATING AFTER LUNCH OR DINNER  YOU MUST WRITE THESE DOWN AND BRING THEM IN NEXT OFFICE VISIT  Drink 80-100 oz a day of water, measure it out Eat 3 meals a day, have to do breakfast, eat protein- hard boiled eggs, protein bar like nature [place] protein bar, greek yogurt like oikos triple zero, chobani 100, or light n fit greek     Bad carbs also include fruit juice, alcohol, and sweet tea. These are empty calories that do not signal to your brain that you are full.   Please remember the good carbs are still carbs which convert into sugar. So please measure them out no more than 1/2-1 cup of rice, oatmeal, pasta, and beans  Veggies are however free foods! Pile them on.   Not all fruit is created equal. Please see the list below, the fruit at the bottom is higher in sugars than the fruit at the top. Please avoid all dried fruits.     Costochondritis Costochondritis is swelling and irritation (inflammation) of the tissue (cartilage) that connects your ribs to your breastbone (sternum). This causes pain in the front of your chest. The pain usually starts gradually and involves more than one rib. What are the causes? The exact cause of this condition is not always known. It results from stress on the cartilage where your ribs attach to your sternum. The cause of this stress could be:  Chest injury (trauma).  Exercise or activity, such as lifting.  Severe coughing.  What increases the risk? You may be at higher risk for this condition if you:  Are male.  Are 68?68 years old.  Recently started a new exercise or work activity.  Have low levels of vitamin D.  Have a condition that makes you cough frequently.  What are the signs or symptoms? The main symptom of this condition is chest pain. The pain:  Usually starts gradually and can be sharp or dull.  Gets worse with  deep breathing, coughing, or exercise.  Gets better with rest.  May be worse when you press on the sternum-rib connection (tenderness).  How is this diagnosed? This condition is diagnosed based on your symptoms, medical history, and a physical exam. Your health care provider will check for tenderness when pressing on your sternum. This is the most important finding. You may also have tests to rule out other causes of chest pain. These may include:  A chest X-ray to check for lung problems.  An electrocardiogram (ECG) to see if you have a heart problem that could be causing the pain.  An imaging scan to rule out a chest or rib fracture.  How is this treated? This condition usually goes away on its own over time. Your health care provider may prescribe an NSAID to reduce pain and inflammation. Your health care provider may also suggest that you:  Rest and avoid activities that make pain worse.  Apply heat or cold to the area to reduce pain and inflammation.  Do exercises to stretch your chest muscles.  If these treatments do not help, your health care provider may inject a numbing medicine at the sternum-rib connection to help relieve the pain. Follow these instructions at home:  Avoid activities that make pain worse. This includes any activities that use chest, abdominal, and side muscles.  If directed, put ice on the painful area: ?  Put ice in a plastic bag. ? Place a towel between your skin and the bag. ? Leave the ice on for 20 minutes, 2-3 times a day.  If directed, apply heat to the affected area as often as told by your health care provider. Use the heat source that your health care provider recommends, such as a moist heat pack or a heating pad. ? Place a towel between your skin and the heat source. ? Leave the heat on for 20-30 minutes. ? Remove the heat if your skin turns bright red. This is especially important if you are unable to feel pain, heat, or cold. You may have  a greater risk of getting burned.  Take over-the-counter and prescription medicines only as told by your health care provider.  Return to your normal activities as told by your health care provider. Ask your health care provider what activities are safe for you.  Keep all follow-up visits as told by your health care provider. This is important. Contact a health care provider if:  You have chills or a fever.  Your pain does not go away or it gets worse.  You have a cough that does not go away (is persistent). Get help right away if:  You have shortness of breath. This information is not intended to replace advice given to you by your health care provider. Make sure you discuss any questions you have with your health care provider. Document Released: 12/05/2004 Document Revised: 09/15/2015 Document Reviewed: 06/21/2015 Elsevier Interactive Patient Education  2018 [employer].   Hypoglycemia Hypoglycemia occurs when the level of sugar (glucose) in the blood is too low. Glucose is a type of sugar that provides the body's main source of energy. Certain hormones (insulin and glucagon) control the level of glucose in the blood. Insulin lowers blood glucose, and glucagon increases blood glucose. Hypoglycemia can result from having too much insulin in the bloodstream, or from not eating enough food that contains glucose. Hypoglycemia can happen in people who do or do not have diabetes. It can develop quickly, and it can be a medical emergency. What are the causes? Hypoglycemia occurs most often in people who have diabetes. If you have diabetes, hypoglycemia may be caused by:  Diabetes medicine.  Not eating enough, or not eating often enough.  Increased physical activity.  Drinking alcohol, especially when you have not eaten recently.  If you do not have diabetes, hypoglycemia may be caused by:  A tumor in the pancreas. The pancreas is the organ that makes insulin.  Not eating enough,  or not eating for long periods at a time (fasting).  Severe infection or illness that affects the liver, heart, or kidneys.  Certain medicines.  You may also have reactive hypoglycemia. This condition causes hypoglycemia within 4 hours of eating a meal. This may occur after having stomach surgery. Sometimes, the cause of reactive hypoglycemia is not known. What increases the risk? Hypoglycemia is more likely to develop in:  People who have diabetes and take medicines to lower blood glucose.  People who abuse alcohol.  People who have a severe illness.  What are the signs or symptoms? Hypoglycemia may not cause any symptoms. If you have symptoms, they may include:  Hunger.  Anxiety.  Sweating and feeling clammy.  Confusion.  Dizziness or feeling light-headed.  Sleepiness.  Nausea.  Increased heart rate.  Headache.  Blurry vision.  Seizure.  Nightmares.  Tingling or numbness around the mouth, lips, or tongue.  A change  in speech.  Decreased ability to concentrate.  A change in coordination.  Restless sleep.  Tremors or shakes.  Fainting.  Irritability.  How is this diagnosed? Hypoglycemia is diagnosed with a blood test to measure your blood glucose level. This blood test is done while you are having symptoms. Your health care provider may also do a physical exam and review your medical history. If you do not have diabetes, other tests may be done to find the cause of your hypoglycemia. How is this treated? This condition can often be treated by immediately eating or drinking something that contains glucose, such as:  3-4 sugar tablets (glucose pills).  Glucose gel, 15-gram tube.  Fruit juice, 4 oz (120 mL).  Regular soda (not diet soda), 4 oz (120 mL).  Low-fat milk, 4 oz (120 mL).  Several pieces of hard candy.  Sugar or honey, 1 Tbsp.  Treating Hypoglycemia If You Have Diabetes  If you are alert and able to swallow safely, follow the  15:15 rule:  Take 15 grams of a rapid-acting carbohydrate. Rapid-acting options include: ? 1 tube of glucose gel. ? 3 glucose pills. ? 6-8 pieces of hard candy. ? 4 oz (120 mL) of fruit juice. ? 4 oz (120 ml) of regular (not diet) soda.  Check your blood glucose 15 minutes after you take the carbohydrate.  If the repeat blood glucose level is still at or below 70 mg/dL (3.9 mmol/L), take 15 grams of a carbohydrate again.  If your blood glucose level does not increase above 70 mg/dL (3.9 mmol/L) after 3 tries, seek emergency medical care.  After your blood glucose level returns to normal, eat a meal or a snack within 1 hour.  Treating Severe Hypoglycemia Severe hypoglycemia is when your blood glucose level is at or below 54 mg/dL (3 mmol/L). Severe hypoglycemia is an emergency. Do not wait to see if the symptoms will go away. Get medical help right away. Call your local emergency services (911 in the [place] Do not drive yourself to the hospital. If you have severe hypoglycemia and you cannot eat or drink, you may need an injection of glucagon. A family member or close friend should learn how to check your blood glucose and how to give you a glucagon injection. Ask your health care provider if you need to have an emergency glucagon injection kit available. Severe hypoglycemia may need to be treated in a hospital. The treatment may include getting glucose through an IV tube. You may also need treatment for the cause of your hypoglycemia. Follow these instructions at home: General instructions  Avoid any diets that cause you to not eat enough food. Talk with your health care provider before you start any new diet.  Take over-the-counter and prescription medicines only as told by your health care provider.  Limit alcohol intake to no more than 1 drink per day for nonpregnant women and 2 drinks per day for men. One drink equals 12 oz of beer, 5 oz of wine, or 1 oz of hard liquor.  Keep all  follow-up visits as told by your health care provider. This is important. If You Have Diabetes:   Make sure you know the symptoms of hypoglycemia.  Always have a rapid-acting carbohydrate snack with you to treat low blood sugar.  Follow your diabetes management plan, as told by your health care provider. Make sure you: ? Take your medicines as directed. ? Follow your exercise plan. ? Follow your meal plan. Eat on time,  and do not skip meals. ? Check your blood glucose as often as directed. Make sure to check your blood glucose before and after exercise. If you exercise longer or in a different way than usual, check your blood glucose more often. ? Follow your sick day plan whenever you cannot eat or drink normally. Make this plan in advance with your health care provider.  Share your diabetes management plan with people in your workplace, school, and household.  Check your urine for ketones when you are ill and as told by your health care provider.  Carry a medical alert card or wear medical alert jewelry. If You Have Reactive Hypoglycemia or Low Blood Sugar From Other Causes:  Monitor your blood glucose as told by your health care provider.  Follow instructions from your health care provider about eating or drinking restrictions. Contact a health care provider if:  You have problems keeping your blood glucose in your target range.  You have frequent episodes of hypoglycemia. Get help right away if:  You continue to have hypoglycemia symptoms after eating or drinking something containing glucose.  Your blood glucose is at or below 54 mg/dL (3 mmol/L).  You have a seizure.  You faint. These symptoms may represent a serious problem that is an emergency. Do not wait to see if the symptoms will go away. Get medical help right away. Call your local emergency services (911 in the [place] Do not drive yourself to the hospital. This information is not intended to replace advice given  to you by your health care provider. Make sure you discuss any questions you have with your health care provider. Document Released: 02/25/2005 Document Revised: 08/09/2015 Document Reviewed: 03/31/2015 Elsevier Interactive Patient Education  [employer].

## 2016-12-25 ENCOUNTER — Other Ambulatory Visit: Payer: Self-pay | Admitting: Cardiology

## 2017-01-07 ENCOUNTER — Encounter: Payer: Self-pay | Admitting: Internal Medicine

## 2017-01-08 ENCOUNTER — Encounter: Payer: Self-pay | Admitting: Internal Medicine

## 2017-01-08 ENCOUNTER — Ambulatory Visit (INDEPENDENT_AMBULATORY_CARE_PROVIDER_SITE_OTHER): Payer: Medicare Other | Admitting: Internal Medicine

## 2017-01-08 VITALS — BP 126/86 | HR 76 | Temp 97.3°F | Resp 18 | Ht 61.0 in | Wt 141.8 lb

## 2017-01-08 DIAGNOSIS — I2510 Atherosclerotic heart disease of native coronary artery without angina pectoris: Secondary | ICD-10-CM

## 2017-01-08 DIAGNOSIS — E1122 Type 2 diabetes mellitus with diabetic chronic kidney disease: Secondary | ICD-10-CM

## 2017-01-08 DIAGNOSIS — Z79899 Other long term (current) drug therapy: Secondary | ICD-10-CM

## 2017-01-08 DIAGNOSIS — N181 Chronic kidney disease, stage 1: Secondary | ICD-10-CM

## 2017-01-08 DIAGNOSIS — E782 Mixed hyperlipidemia: Secondary | ICD-10-CM | POA: Diagnosis not present

## 2017-01-08 DIAGNOSIS — E559 Vitamin D deficiency, unspecified: Secondary | ICD-10-CM | POA: Diagnosis not present

## 2017-01-08 DIAGNOSIS — I10 Essential (primary) hypertension: Secondary | ICD-10-CM | POA: Diagnosis not present

## 2017-01-08 NOTE — Progress Notes (Signed)
This very nice 66 y.o. [place] man  presents for 3 month follow up with Hypertension, Hyperlipidemia, Pre-Diabetes and Vitamin D Deficiency.      Patient is treated for HTN & BP has been controlled at home. Today's BP is art goal - 126/86. In 2008, he underwent CABG and is followed by Dr Percival Spanish.  Patient has had no complaints of any cardiac type chest pain, palpitations, dyspnea / orthopnea / PND, dizziness, claudication, or dependent edema.     Hyperlipidemia is controlled with diet & meds. Patient denies myalgias or other med SE's. Last Lipids were at goal: Lab Results  Component Value Date   CHOL 180 01/08/2017   HDL 38 (L) 01/08/2017   LDLCALC 99 10/08/2016   TRIG 157 (H) 01/08/2017   CHOLHDL 4.7 01/08/2017      Also, the patient has history of T2_NIDDM circa 2000 and has had no symptoms of reactive hypoglycemia, diabetic polys, paresthesias or visual blurring.  He admits poor dietary compliance, but related CBG's< 130 mg%. Last A1c was not at goal: Lab Results  Component Value Date   HGBA1C 8.8 (H) 10/08/2016      Further, the patient also has history of Vitamin D Deficiency ("37" / Mar/2017)and supplements vitamin D without any suspected side-effects. Last vitamin D was very low: Lab Results  Component Value Date   VD25OH 33 07/05/2016   Current Outpatient Prescriptions on File Prior to Visit  Medication Sig  . aspirin 81 MG tablet Take 81 mg by mouth daily.  . diclofenac (VOLTAREN) 75 MG EC tablet Take 75 mg by mouth 2 (two) times daily.  . enalapril (VASOTEC) 10 MG tablet TAKE 1 TABLET BY MOUTH EVERY DAY  . glimepiride (AMARYL) 4 MG tablet TAKE 1 TABLET BY MOUTH TWICE A DAY  . metFORMIN (GLUCOPHAGE) 500 MG tablet TAKE 2 TABLETS BY MOUTH EVERY MORNING AND 2 EVERY EVENING  . metoprolol tartrate (LOPRESSOR) 25 MG tablet TAKE 1 TABLET BY MOUTH TWICE A DAY *NEED TO MAKE OFFICE VISIT  . Multiple Vitamin (MULTIVITAMIN) tablet Take 1 tablet by mouth daily.  . pravastatin  (PRAVACHOL) 80 MG tablet Take 1 tablet (80 mg total) by mouth every evening. <PLEASE MAKE APPOINTMENT FOR REFILLS>   No current facility-administered medications on file prior to visit.    Allergies  Allergen Reactions  . Statins Other (See Comments)    Elevated LFT's   PMHx:   Past Medical History:  Diagnosis Date  . Anemia   . Carotid artery stenosis    60 %-80 %  . Coronary artery disease    s/p CABG in 2008  . Diabetes mellitus type II   . Dyslipidemia   . Hyperlipidemia   . Hypertension   . Vitamin D deficiency    Immunization History  Administered Date(s) Administered  . DT 02/07/2015  . Influenza Whole 12/10/2012  . Influenza, High Dose Seasonal PF 02/07/2015, 01/02/2016, 12/13/2016  . Influenza-Unspecified 12/31/2013  . Pneumococcal Conjugate-13 02/02/2014  . Pneumococcal Polysaccharide-23 04/13/2003, 10/08/2016  . Td 04/18/2004   Past Surgical History:  Procedure Laterality Date  . CORONARY ARTERY BYPASS GRAFT  2008   LIMA to the LAD, saphenous vein graft to diagonal, saphenous vein graft to OM, saphenous vein graft to posterolateral 2008   FHx:    Reviewed / unchanged  SHx:    Reviewed / unchanged  Systems Review:  Constitutional: Denies fever, chills, wt changes, headaches, insomnia, fatigue, night sweats, change in appetite. Eyes: Denies redness,  blurred vision, diplopia, discharge, itchy, watery eyes.  ENT: Denies discharge, congestion, post nasal drip, epistaxis, sore throat, earache, hearing loss, dental pain, tinnitus, vertigo, sinus pain, snoring.  CV: Denies chest pain, palpitations, irregular heartbeat, syncope, dyspnea, diaphoresis, orthopnea, PND, claudication or edema. Respiratory: denies cough, dyspnea, DOE, pleurisy, hoarseness, laryngitis, wheezing.  Gastrointestinal: Denies dysphagia, odynophagia, heartburn, reflux, water brash, abdominal pain or cramps, nausea, vomiting, bloating, diarrhea, constipation, hematemesis, melena, hematochezia  or  hemorrhoids. Genitourinary: Denies dysuria, frequency, urgency, nocturia, hesitancy, discharge, hematuria or flank pain. Musculoskeletal: Denies arthralgias, myalgias, stiffness, jt. swelling, pain, limping or strain/sprain.  Skin: Denies pruritus, rash, hives, warts, acne, eczema or change in skin lesion(s). Neuro: No weakness, tremor, incoordination, spasms, paresthesia or pain. Psychiatric: Denies confusion, memory loss or sensory loss. Endo: Denies change in weight, skin or hair change.  Heme/Lymph: No excessive bleeding, bruising or enlarged lymph nodes.  Physical Exam  BP 126/86   Pulse 76   Temp (!) 97.3 F (36.3 C)   Resp 18   Ht 5\' 1"  (1.549 m)   Wt 141 lb 12.8 oz (64.3 kg)   BMI 26.79 kg/m   Appears well nourished, well groomed  and in no distress.  Eyes: PERRLA, EOMs, conjunctiva no swelling or erythema. Sinuses: No frontal/maxillary tenderness ENT/Mouth: EAC's clear, TM's nl w/o erythema, bulging. Nares clear w/o erythema, swelling, exudates. Oropharynx clear without erythema or exudates. Oral hygiene is good. Tongue normal, non obstructing. Hearing intact.  Neck: Supple. Thyroid nl. Car 2+/2+ without bruits, nodes or JVD. Chest: Respirations nl with BS clear & equal w/o rales, rhonchi, wheezing or stridor.  Cor: Heart sounds normal w/ regular rate and rhythm without sig. murmurs, gallops, clicks or rubs. Peripheral pulses normal and equal  without edema.  Abdomen: Soft & bowel sounds normal. Non-tender w/o guarding, rebound, hernias, masses or organomegaly.  Lymphatics: Unremarkable.  Musculoskeletal: Full ROM all peripheral extremities, joint stability, 5/5 strength and normal gait.  Skin: Warm, dry without exposed rashes, lesions or ecchymosis apparent.  Neuro: Cranial nerves intact, reflexes equal bilaterally. Sensory-motor testing grossly intact. Tendon reflexes grossly intact.  Pysch: Alert & oriented x 3.  Insight and judgement nl & appropriate. No  ideations.  Assessment and Plan:  1. Essential hypertension  - Continue medication, monitor blood pressure at home.  - Continue DASH diet. Reminder to go to the ER if any CP,  SOB, nausea, dizziness, severe HA, changes vision/speech. - CBC with Differential/Platelet - BASIC METABOLIC PANEL WITH GFR - Magnesium - TSH  2. Hyperlipidemia, mixed  - Continue diet/meds, exercise,& lifestyle modifications.  - Continue monitor periodic cholesterol/liver & renal functions   - Hepatic function panel - Lipid panel - TSH  3. Type 2 diabetes mellitus with stage 1 chronic kidney disease, without long-term current use of insulin (H - Continue diet, exercise, lifestyle modifications.  - Monitor appropriate labs.CC)  - Hemoglobin A1c - Insulin, random  4. Vitamin D deficiency  - Continue supplementation.  - VITAMIN D 25 Hydroxy  5. Atherosclerosis of native coronary artery of native heart without angina pectoris   6. Medication management  - CBC with Differential/Platelet - BASIC METABOLIC PANEL WITH GFR - Hepatic function panel - Magnesium - Lipid panel - TSH - Hemoglobin A1c - Insulin, random - VITAMIN D 25 Hydroxy       Discussed  regular exercise, BP monitoring, weight control to achieve/maintain BMI less than 25 and discussed med and SE's. Recommended labs to assess and monitor clinical status with further disposition pending  results of labs. Over 30 minutes of exam, counseling, chart review was performed.

## 2017-01-08 NOTE — Patient Instructions (Signed)

## 2017-01-09 ENCOUNTER — Other Ambulatory Visit: Payer: Self-pay | Admitting: Internal Medicine

## 2017-01-09 ENCOUNTER — Other Ambulatory Visit: Payer: Self-pay | Admitting: Cardiology

## 2017-01-09 DIAGNOSIS — I6529 Occlusion and stenosis of unspecified carotid artery: Secondary | ICD-10-CM

## 2017-01-09 DIAGNOSIS — E782 Mixed hyperlipidemia: Secondary | ICD-10-CM

## 2017-01-09 HISTORY — DX: Occlusion and stenosis of unspecified carotid artery: I65.29

## 2017-01-09 LAB — BASIC METABOLIC PANEL WITH GFR
BUN: 9 mg/dL (ref 7–25)
CALCIUM: 9.5 mg/dL (ref 8.6–10.3)
CHLORIDE: 100 mmol/L (ref 98–110)
CO2: 28 mmol/L (ref 20–32)
CREATININE: 0.98 mg/dL (ref 0.70–1.25)
GFR, Est African American: 91 mL/min/{1.73_m2} (ref >=60)
GFR, Est Non African American: 79 mL/min/{1.73_m2} (ref >=60)
GLUCOSE: 187 mg/dL — AB (ref 65–99)
Potassium: 5.4 mmol/L — ABNORMAL HIGH (ref 3.5–5.3)
Sodium: 134 mmol/L — ABNORMAL LOW (ref 135–146)

## 2017-01-09 LAB — HEPATIC FUNCTION PANEL
AG RATIO: 1.5 (calc) (ref 1.0–2.5)
ALT: 15 U/L (ref 9–46)
AST: 12 U/L (ref 10–35)
Albumin: 4 g/dL (ref 3.6–5.1)
Alkaline phosphatase (APISO): 60 U/L (ref 40–115)
BILIRUBIN INDIRECT: 0.4 mg/dL (ref 0.2–1.2)
Bilirubin, Direct: 0.1 mg/dL (ref 0.0–0.2)
GLOBULIN: 2.7 g/dL (ref 1.9–3.7)
TOTAL PROTEIN: 6.7 g/dL (ref 6.1–8.1)
Total Bilirubin: 0.5 mg/dL (ref 0.2–1.2)

## 2017-01-09 LAB — HEMOGLOBIN A1C
HEMOGLOBIN A1C: 9.2 %{Hb} — AB (ref <5.7)
Mean Plasma Glucose: 217 (calc)
eAG (mmol/L): 12 (calc)

## 2017-01-09 LAB — CBC WITH DIFFERENTIAL/PLATELET
BASOS ABS: 62 {cells}/uL (ref 0–200)
BASOS PCT: 0.8 %
EOS PCT: 6.1 %
Eosinophils Absolute: 476 cells/uL (ref 15–500)
HCT: 37.9 % — ABNORMAL LOW (ref 38.5–50.0)
HEMOGLOBIN: 11.3 g/dL — AB (ref 13.2–17.1)
LYMPHS ABS: 1802 {cells}/uL (ref 850–3900)
MCH: 19.7 pg — ABNORMAL LOW (ref 27.0–33.0)
MCHC: 29.8 g/dL — ABNORMAL LOW (ref 32.0–36.0)
MCV: 66.1 fL — ABNORMAL LOW (ref 80.0–100.0)
MONOS PCT: 8.6 %
MPV: 11.5 fL (ref 7.5–12.5)
NEUTROS ABS: 4789 {cells}/uL (ref 1500–7800)
Neutrophils Relative %: 61.4 %
PLATELETS: 334 10*3/uL (ref 140–400)
RBC: 5.73 10*6/uL (ref 4.20–5.80)
RDW: 16.1 % — ABNORMAL HIGH (ref 11.0–15.0)
Total Lymphocyte: 23.1 %
WBC mixed population: 671 cells/uL (ref 200–950)
WBC: 7.8 10*3/uL (ref 3.8–10.8)

## 2017-01-09 LAB — TSH: TSH: 2.12 m[IU]/L (ref 0.40–4.50)

## 2017-01-09 LAB — LIPID PANEL
CHOL/HDL RATIO: 4.7 (calc) (ref <5.0)
CHOLESTEROL: 180 mg/dL (ref <200)
HDL: 38 mg/dL — AB (ref >40)
LDL CHOLESTEROL (CALC): 114 mg/dL — AB
NON-HDL CHOLESTEROL (CALC): 142 mg/dL — AB (ref <130)
Triglycerides: 157 mg/dL — ABNORMAL HIGH (ref <150)

## 2017-01-09 LAB — VITAMIN D 25 HYDROXY (VIT D DEFICIENCY, FRACTURES): Vit D, 25-Hydroxy: 28 ng/mL — ABNORMAL LOW (ref 30–100)

## 2017-01-09 LAB — INSULIN, RANDOM: Insulin: 3.3 u[IU]/mL (ref 2.0–19.6)

## 2017-01-09 LAB — MAGNESIUM: MAGNESIUM: 1.7 mg/dL (ref 1.5–2.5)

## 2017-01-09 MED ORDER — EZETIMIBE 10 MG PO TABS: ORAL_TABLET | ORAL | 1 refills | Status: DC

## 2017-01-09 NOTE — Telephone Encounter (Signed)
REFILL 

## 2017-01-29 ENCOUNTER — Encounter (HOSPITAL_COMMUNITY): Payer: Self-pay | Admitting: Emergency Medicine

## 2017-01-29 ENCOUNTER — Other Ambulatory Visit: Payer: Self-pay | Admitting: Cardiology

## 2017-01-29 ENCOUNTER — Emergency Department (HOSPITAL_COMMUNITY): Payer: Medicare Other

## 2017-01-29 ENCOUNTER — Observation Stay (HOSPITAL_COMMUNITY)
Admission: EM | Admit: 2017-01-29 | Discharge: 2017-01-29 | Disposition: A | Discharge status: Home / Self Care | Payer: Medicare Other | Attending: Internal Medicine | Admitting: Internal Medicine

## 2017-01-29 DIAGNOSIS — I10 Essential (primary) hypertension: Secondary | ICD-10-CM | POA: Diagnosis not present

## 2017-01-29 DIAGNOSIS — Z7982 Long term (current) use of aspirin: Secondary | ICD-10-CM | POA: Diagnosis not present

## 2017-01-29 DIAGNOSIS — D509 Iron deficiency anemia, unspecified: Secondary | ICD-10-CM | POA: Insufficient documentation

## 2017-01-29 DIAGNOSIS — R072 Precordial pain: Secondary | ICD-10-CM | POA: Diagnosis not present

## 2017-01-29 DIAGNOSIS — I6521 Occlusion and stenosis of right carotid artery: Secondary | ICD-10-CM

## 2017-01-29 DIAGNOSIS — Z87891 Personal history of nicotine dependence: Secondary | ICD-10-CM | POA: Insufficient documentation

## 2017-01-29 DIAGNOSIS — R0789 Other chest pain: Secondary | ICD-10-CM | POA: Diagnosis not present

## 2017-01-29 DIAGNOSIS — I2510 Atherosclerotic heart disease of native coronary artery without angina pectoris: Secondary | ICD-10-CM | POA: Insufficient documentation

## 2017-01-29 DIAGNOSIS — N181 Chronic kidney disease, stage 1: Secondary | ICD-10-CM

## 2017-01-29 DIAGNOSIS — E1140 Type 2 diabetes mellitus with diabetic neuropathy, unspecified: Secondary | ICD-10-CM | POA: Insufficient documentation

## 2017-01-29 DIAGNOSIS — E559 Vitamin D deficiency, unspecified: Secondary | ICD-10-CM | POA: Diagnosis not present

## 2017-01-29 DIAGNOSIS — Z79899 Other long term (current) drug therapy: Secondary | ICD-10-CM | POA: Diagnosis not present

## 2017-01-29 DIAGNOSIS — R079 Chest pain, unspecified: Secondary | ICD-10-CM | POA: Diagnosis not present

## 2017-01-29 DIAGNOSIS — I700 Atherosclerosis of aorta: Secondary | ICD-10-CM | POA: Diagnosis not present

## 2017-01-29 DIAGNOSIS — Z951 Presence of aortocoronary bypass graft: Secondary | ICD-10-CM | POA: Diagnosis not present

## 2017-01-29 DIAGNOSIS — E1122 Type 2 diabetes mellitus with diabetic chronic kidney disease: Secondary | ICD-10-CM

## 2017-01-29 DIAGNOSIS — I200 Unstable angina: Secondary | ICD-10-CM

## 2017-01-29 DIAGNOSIS — E119 Type 2 diabetes mellitus without complications: Secondary | ICD-10-CM

## 2017-01-29 DIAGNOSIS — Z794 Long term (current) use of insulin: Secondary | ICD-10-CM | POA: Insufficient documentation

## 2017-01-29 DIAGNOSIS — E785 Hyperlipidemia, unspecified: Secondary | ICD-10-CM | POA: Insufficient documentation

## 2017-01-29 DIAGNOSIS — R918 Other nonspecific abnormal finding of lung field: Secondary | ICD-10-CM | POA: Diagnosis not present

## 2017-01-29 LAB — CBC WITH DIFFERENTIAL/PLATELET
BASOS ABS: 0.1 10*3/uL (ref 0.0–0.1)
Basophils Relative: 1 %
EOS ABS: 0.4 10*3/uL (ref 0.0–0.7)
EOS PCT: 6 %
HCT: 31.3 % — ABNORMAL LOW (ref 39.0–52.0)
Hemoglobin: 10.1 g/dL — ABNORMAL LOW (ref 13.0–17.0)
Lymphocytes Relative: 26 %
Lymphs Abs: 1.6 10*3/uL (ref 0.7–4.0)
MCH: 20 pg — ABNORMAL LOW (ref 26.0–34.0)
MCHC: 32.3 g/dL (ref 30.0–36.0)
MCV: 62 fL — AB (ref 78.0–100.0)
MONO ABS: 0.6 10*3/uL (ref 0.1–1.0)
Monocytes Relative: 10 %
NEUTROS PCT: 57 %
Neutro Abs: 3.5 10*3/uL (ref 1.7–7.7)
PLATELETS: 255 10*3/uL (ref 150–400)
RBC: 5.05 MIL/uL (ref 4.22–5.81)
RDW: 16.1 % — AB (ref 11.5–15.5)
WBC: 6.2 10*3/uL (ref 4.0–10.5)

## 2017-01-29 LAB — I-STAT CHEM 8, ED
BUN: 11 mg/dL (ref 6–20)
CHLORIDE: 101 mmol/L (ref 101–111)
Calcium, Ion: 1.17 mmol/L (ref 1.15–1.40)
Creatinine, Ser: 0.9 mg/dL (ref 0.61–1.24)
Glucose, Bld: 128 mg/dL — ABNORMAL HIGH (ref 65–99)
HEMATOCRIT: 35 % — AB (ref 39.0–52.0)
HEMOGLOBIN: 11.9 g/dL — AB (ref 13.0–17.0)
POTASSIUM: 4.3 mmol/L (ref 3.5–5.1)
SODIUM: 134 mmol/L — AB (ref 135–145)
TCO2: 23 mmol/L (ref 22–32)

## 2017-01-29 LAB — TROPONIN I: Troponin I: 0.03 ng/mL (ref <0.03)

## 2017-01-29 LAB — I-STAT TROPONIN, ED: TROPONIN I, POC: 0 ng/mL (ref 0.00–0.08)

## 2017-01-29 LAB — CBG MONITORING, ED: Glucose-Capillary: 135 mg/dL — ABNORMAL HIGH (ref 65–99)

## 2017-01-29 LAB — TSH: TSH: 3.434 u[IU]/mL (ref 0.350–4.500)

## 2017-01-29 MED ORDER — ASPIRIN EC 325 MG PO TBEC
325.0000 mg | DELAYED_RELEASE_TABLET | Freq: Every day | ORAL | Status: DC
  Administered 2017-01-29: 325 mg via ORAL
  Filled 2017-01-29: qty 1

## 2017-01-29 MED ORDER — ENOXAPARIN SODIUM 40 MG/0.4ML ~~LOC~~ SOLN
40.0000 mg | SUBCUTANEOUS | Status: DC
  Administered 2017-01-29: 40 mg via SUBCUTANEOUS
  Filled 2017-01-29: qty 0.4

## 2017-01-29 MED ORDER — METOPROLOL TARTRATE 25 MG PO TABS
25.0000 mg | ORAL_TABLET | Freq: Two times a day (BID) | ORAL | Status: DC
  Administered 2017-01-29: 25 mg via ORAL
  Filled 2017-01-29: qty 1

## 2017-01-29 MED ORDER — KETOROLAC TROMETHAMINE 30 MG/ML IJ SOLN: 15.0000 mg | Freq: Once | INTRAMUSCULAR | Status: DC

## 2017-01-29 MED ORDER — INSULIN ASPART 100 UNIT/ML ~~LOC~~ SOLN
0.0000 [IU] | Freq: Three times a day (TID) | SUBCUTANEOUS | Status: DC
  Administered 2017-01-29: 1 [IU] via SUBCUTANEOUS
  Filled 2017-01-29: qty 1

## 2017-01-29 MED ORDER — ONDANSETRON HCL 4 MG/2ML IJ SOLN: 4.0000 mg | Freq: Four times a day (QID) | INTRAMUSCULAR | Status: DC | PRN

## 2017-01-29 MED ORDER — ASPIRIN 81 MG PO TABS: 81.0000 mg | ORAL_TABLET | Freq: Every day | ORAL | Status: DC

## 2017-01-29 MED ORDER — NITROGLYCERIN 2 % TD OINT
1.0000 [in_us] | TOPICAL_OINTMENT | Freq: Once | TRANSDERMAL | Status: AC
  Administered 2017-01-29: 1 [in_us] via TOPICAL
  Filled 2017-01-29: qty 1

## 2017-01-29 MED ORDER — ACETAMINOPHEN 325 MG PO TABS: 650.0000 mg | ORAL_TABLET | ORAL | Status: DC | PRN

## 2017-01-29 MED ORDER — ENALAPRIL MALEATE 10 MG PO TABS
10.0000 mg | ORAL_TABLET | Freq: Every day | ORAL | Status: DC
  Administered 2017-01-29: 10 mg via ORAL
  Filled 2017-01-29: qty 1

## 2017-01-29 MED ORDER — EZETIMIBE 10 MG PO TABS
10.0000 mg | ORAL_TABLET | Freq: Every day | ORAL | Status: DC
  Administered 2017-01-29: 10 mg via ORAL
  Filled 2017-01-29: qty 1

## 2017-01-29 NOTE — H&P (Signed)
History and Physical    William Haney RCV:[id number] DOB: February 01, 1951 DOA: 01/29/2017  PCP: Unk Pinto, MD  Patient coming from: Home.  Chief Complaint: Chest pain.  HPI: William Haney is a 66 y.o. male with history of CAD status post CABG, hypertension, hyperlipidemia and diabetes mellitus type 2 presents to the ER because of chest pain.  Patient states he has been having chest pain for last 2 months and is reproducible on deep palpation of the anterior chest wall.  However last night after eating dinner patient felt that his chest pain was radiating to the back and was little different from what it was before.  Denies any associated shortness of breath diaphoresis nausea vomiting or diarrhea.  ED Course: In the ER cardiac markers were negative chest x-ray unremarkable troponin was negative.  Chest pain improved with sublingual nitroglycerin and aspirin.  Patient admitted for further management of chest pain.  Presently chest pain-free.  Review of Systems: As per HPI, rest all negative.   Past Medical History:  Diagnosis Date  . Anemia   . Carotid artery stenosis    60 %-80 %  . Coronary artery disease    s/p CABG in 2008  . Diabetes mellitus type II   . Dyslipidemia   . Hyperlipidemia   . Hypertension   . Vitamin D deficiency     Past Surgical History:  Procedure Laterality Date  . CORONARY ARTERY BYPASS GRAFT  2008   LIMA to the LAD, saphenous vein graft to diagonal, saphenous vein graft to OM, saphenous vein graft to posterolateral 2008     reports that he quit smoking about 21 years ago. he has never used smokeless tobacco. He reports that he does not drink alcohol or use drugs.  Allergies  Allergen Reactions  . Statins Other (See Comments)    Elevated LFT's    Family History  Problem Relation Age of Onset  . Hypertension Father   . Heart disease Father   . Hyperlipidemia Father   . Heart attack Father   . Diabetes Sister   . Heart disease Brother      Prior to Admission medications   Medication Sig Start Date End Date Taking? Authorizing Provider  aspirin 81 MG tablet Take 81 mg by mouth daily.   Yes [provider]  diclofenac (VOLTAREN) 75 MG EC tablet Take 75 mg by mouth 2 (two) times daily as needed for mild pain.    Yes [provider]  enalapril (VASOTEC) 10 MG tablet TAKE 1 TABLET BY MOUTH EVERY DAY Patient taking differently: TAKE 10mg  BY MOUTH EVERY DAY 05/15/16  Yes [name], PA-C  ezetimibe (ZETIA) 10 MG tablet Take 1 tablet daily for Cholesterol Patient taking differently: Take 10 mg by mouth daily.  01/09/17  Yes Unk Pinto, MD  glimepiride (AMARYL) 4 MG tablet TAKE 1 TABLET BY MOUTH TWICE A DAY Patient taking differently: TAKE 4mg  BY MOUTH TWICE A DAY 08/12/16  Yes [name], PA-C  metFORMIN (GLUCOPHAGE) 500 MG tablet TAKE 2 TABLETS BY MOUTH EVERY MORNING AND 2 EVERY EVENING Patient taking differently: Take 1,000 mg by mouth 2 (two) times daily with a meal.  04/23/16  Yes Unk Pinto, MD  metoprolol tartrate (LOPRESSOR) 25 MG tablet Take 1 tablet (25 mg total) by mouth 2 (two) times daily. NEED OV. 01/09/17  Yes Minus Breeding, MD  Multiple Vitamin (MULTIVITAMIN) tablet Take 1 tablet by mouth daily.   Yes [provider]    Physical Exam: Vitals:  01/29/17 0133 01/29/17 0138 01/29/17 0200 01/29/17 0315  BP:  (!) 153/90 131/89 128/77  Pulse:  86 80 72  Resp:  18 (!) 24 17  Temp:  98.2 F (36.8 C)    TempSrc:  Oral    SpO2: 100% 99% 98% 97%      Constitutional: Moderately built and nourished. Vitals:   01/29/17 0133 01/29/17 0138 01/29/17 0200 01/29/17 0315  BP:  (!) 153/90 131/89 128/77  Pulse:  86 80 72  Resp:  18 (!) 24 17  Temp:  98.2 F (36.8 C)    TempSrc:  Oral    SpO2: 100% 99% 98% 97%   Eyes: Anicteric no pallor. ENMT: No discharge from the ears eyes nose or mouth. Neck: No mass felt.  No neck rigidity. Respiratory: No rhonchi or  crepitations. Cardiovascular: S1-S2 heard no murmurs appreciated. Abdomen: Soft nontender bowel sounds present. Musculoskeletal: No edema.  No joint effusion. Skin: No rash.  Skin appears warm. Neurologic: Alert awake oriented to time place and person.  Moves all extremities. Psychiatric: Appears normal.  Normal affect.   Labs on Admission: I have personally reviewed following labs and imaging studies  CBC: Recent Labs  Lab 01/29/17 0210 01/29/17 0224  WBC 6.2  --   NEUTROABS 3.5  --   HGB 10.1* 11.9*  HCT 31.3* 35.0*  MCV 62.0*  --   PLT 255  --    Basic Metabolic Panel: Recent Labs  Lab 01/29/17 0224  NA 134*  K 4.3  CL 101  GLUCOSE 128*  BUN 11  CREATININE 0.90   GFR: CrCl cannot be calculated (Unknown ideal weight.). Liver Function Tests: No results for input(s): AST, ALT, ALKPHOS, BILITOT, PROT, ALBUMIN in the last 168 hours. No results for input(s): LIPASE, AMYLASE in the last 168 hours. No results for input(s): AMMONIA in the last 168 hours. Coagulation Profile: No results for input(s): INR, PROTIME in the last 168 hours. Cardiac Enzymes: No results for input(s): CKTOTAL, CKMB, CKMBINDEX, TROPONINI in the last 168 hours. BNP (last 3 results) No results for input(s): PROBNP in the last 8760 hours. HbA1C: No results for input(s): HGBA1C in the last 72 hours. CBG: No results for input(s): GLUCAP in the last 168 hours. Lipid Profile: No results for input(s): CHOL, HDL, LDLCALC, TRIG, CHOLHDL, LDLDIRECT in the last 72 hours. Thyroid Function Tests: No results for input(s): TSH, T4TOTAL, FREET4, T3FREE, THYROIDAB in the last 72 hours. Anemia Panel: No results for input(s): VITAMINB12, FOLATE, FERRITIN, TIBC, IRON, RETICCTPCT in the last 72 hours. Urine analysis:    Component Value Date/Time   COLORURINE YELLOW 04/03/2016 [place] 04/03/2016 0937   LABSPEC 1.016 04/03/2016 0937   PHURINE 6.5 04/03/2016 [place] 04/03/2016  0937   HGBUR NEGATIVE 04/03/2016 0937   BILIRUBINUR NEGATIVE 04/03/2016 0937   KETONESUR NEGATIVE 04/03/2016 0937   PROTEINUR NEGATIVE 04/03/2016 0937   UROBILINOGEN 0.2 02/02/2014 1058   NITRITE NEGATIVE 04/03/2016 0937   LEUKOCYTESUR NEGATIVE 04/03/2016 0937   Sepsis Labs: @LABRCNTIP (procalcitonin:4,lacticidven:4) )No results found for this or any previous visit (from the past 240 hour(s)).   Radiological Exams on Admission: Dg Chest Portable 1 View  Result Date: 01/29/2017 CLINICAL DATA:  History of coronary artery disease and diabetes. EXAM: PORTABLE CHEST 1 VIEW COMPARISON:  01/28/2013 FINDINGS: Postoperative changes in the mediastinum. Normal heart size and pulmonary vascularity. No focal airspace disease or consolidation in the lungs. No blunting of costophrenic angles. No pneumothorax. Mediastinal contours appear intact.  Calcification of the aorta. IMPRESSION: No evidence of active pulmonary disease.  Aortic atherosclerosis. Electronically Signed   By: Lucienne Capers M.D.   On: 01/29/2017 02:02    EKG: Independently reviewed.  Normal sinus rhythm.  Assessment/Plan Principal Problem:   Chest pain Active Problems:   Essential hypertension   DM type 2 (diabetes mellitus, type 2) ([place])    1. Chest pain with history of CAD status post CABG -has typical and atypical symptoms.  Will keep patient n.p.o. in anticipation of cardiac procedure.  Cycle cardiac markers continue aspirin, metoprolol and Zetia patient is intolerant to statins.  Cardiology has been consulted.   2. Diabetes mellitus type 2 -we will hold oral hypoglycemic and keep patient on sliding scale coverage for now. 3. Hypertension on ACE inhibitors and metoprolol. 4. Hyperlipidemia on Zetia.   DVT prophylaxis: Lovenox. Code Status: Full code. Family Communication: Discussed with patient. Disposition Plan: Home. Consults called: Cardiology notified. Admission status: Observation.   Rise Patience  MD [facility] Pager [phone].  If 7PM-7AM, please contact night-coverage [email or web address] Password [email or web address]  01/29/2017, 3:31 AM

## 2017-01-29 NOTE — Discharge Instructions (Signed)
Follow up with cardiology as scheduled  Return to the ER for new or worsening symptoms

## 2017-01-29 NOTE — ED Notes (Signed)
Admitting at bedside 

## 2017-01-29 NOTE — Progress Notes (Signed)
The patient was seen and examined at his bedside in the ED. He reports chest pain and back pain that is reproducible on palpation. 5/10 dull, non radiating and 10/10 tenderness when touched bilaterally. Not associated with any rashes. Not associated with dyspnea or palpitations.  Vital signs stable. EKG reviewed no ST T elevation. Troponin negative. Cardiology evaluated and recommended outpatient stress test. The patient has a hx of CAD s/p CABG and reported left carotic stenosis.  Received a call from ED provider about the ED discharging the patient from the ED.

## 2017-01-29 NOTE — ED Provider Notes (Signed)
[facility] EMERGENCY DEPARTMENT Provider Note   CSN: [id number] Arrival date & time: 01/29/17  0132     History   Chief Complaint Chief Complaint  Patient presents with  . Chest Pain    HPI William Haney is a 66 y.o. male.  The history is provided by the patient.  Chest Pain   This is a recurrent problem. The current episode started 1 to 2 hours ago. The problem occurs constantly. The problem has not changed since onset.The pain is associated with rest. The pain is present in the substernal region. The pain is mild. The quality of the pain is described as dull. The pain radiates to the left shoulder and left arm. Pertinent negatives include no abdominal pain, no diaphoresis, no palpitations and no syncope. He has tried nitroglycerin for the symptoms. The treatment provided moderate relief. Risk factors include male gender and being elderly.  His past medical history is significant for CAD.  Pertinent negatives for family medical history include: no Marfan's syndrome.  Procedure history is positive for echocardiogram.    Past Medical History:  Diagnosis Date  . Anemia   . Carotid artery stenosis    60 %-80 %  . Coronary artery disease    s/p CABG in 2008  . Diabetes mellitus type II   . Dyslipidemia   . Hyperlipidemia   . Hypertension   . Vitamin D deficiency     Patient Active Problem List   Diagnosis Date Noted  . Poor compliance 09/09/2015  . DM type 2 (diabetes mellitus, type 2) ([place]) 09/08/2015  . Neuropathy, diabetic ([place]) 05/19/2015  . Encounter for Medicare annual wellness exam 09/21/2014  . Essential hypertension 10/11/2013  . Vitamin D deficiency 10/11/2013  . Medication management 10/11/2013  . Hyperlipidemia 03/01/2010  . Anemia 03/01/2010  . Coronary atherosclerosis 03/01/2010  . Carotid stenosis 03/01/2010    Past Surgical History:  Procedure Laterality Date  . CORONARY ARTERY BYPASS GRAFT  2008   LIMA to the LAD, saphenous  vein graft to diagonal, saphenous vein graft to OM, saphenous vein graft to posterolateral 2008       Home Medications    Prior to Admission medications   Medication Sig Start Date End Date Taking? Authorizing Provider  aspirin 81 MG tablet Take 81 mg by mouth daily.    [provider]  diclofenac (VOLTAREN) 75 MG EC tablet Take 75 mg by mouth 2 (two) times daily.    [provider]  enalapril (VASOTEC) 10 MG tablet TAKE 1 TABLET BY MOUTH EVERY DAY 05/15/16   [name], PA-C  ezetimibe (ZETIA) 10 MG tablet Take 1 tablet daily for Cholesterol 01/09/17   Unk Pinto, MD  glimepiride (AMARYL) 4 MG tablet TAKE 1 TABLET BY MOUTH TWICE A DAY 08/12/16   [name], PA-C  metFORMIN (GLUCOPHAGE) 500 MG tablet TAKE 2 TABLETS BY MOUTH EVERY MORNING AND 2 EVERY EVENING 04/23/16   Unk Pinto, MD  metoprolol tartrate (LOPRESSOR) 25 MG tablet Take 1 tablet (25 mg total) by mouth 2 (two) times daily. NEED OV. 01/09/17   Minus Breeding, MD  Multiple Vitamin (MULTIVITAMIN) tablet Take 1 tablet by mouth daily.    [provider]    Family History Family History  Problem Relation Age of Onset  . Hypertension Father   . Heart disease Father   . Hyperlipidemia Father   . Heart attack Father   . Diabetes Sister   . Heart disease Brother  Social History Social History   Tobacco Use  . Smoking status: Former Smoker    Last attempt to quit: 08/29/1995    Years since quitting: 21.4  . Smokeless tobacco: Never Used  Substance Use Topics  . Alcohol use: No  . Drug use: No     Allergies   Statins   Review of Systems Review of Systems  Constitutional: Negative for diaphoresis.  Cardiovascular: Positive for chest pain. Negative for palpitations and syncope.  Gastrointestinal: Negative for abdominal pain.  All other systems reviewed and are negative.    Physical Exam Updated Vital Signs BP 131/89   Pulse 80   Temp 98.2 F (36.8 C) (Oral)    Resp (!) 24   SpO2 98%   Physical Exam  Constitutional: He is oriented to person, place, and time. He appears well-developed and well-nourished. No distress.  HENT:  Head: Normocephalic and atraumatic.  Mouth/Throat: No oropharyngeal exudate.  Eyes: Conjunctivae are normal. Pupils are equal, round, and reactive to light.  Neck: Normal range of motion. Neck supple.  Cardiovascular: Normal rate, regular rhythm, normal heart sounds and intact distal pulses.  Pulmonary/Chest: Effort normal and breath sounds normal. No stridor. He has no wheezes. He has no rales.  Abdominal: Soft. Bowel sounds are normal. He exhibits no mass. There is no tenderness. There is no rebound and no guarding.  Musculoskeletal: Normal range of motion.  Neurological: He is alert and oriented to person, place, and time. He displays normal reflexes.  Skin: Skin is warm and dry. Capillary refill takes less than 2 seconds.  Psychiatric: He has a normal mood and affect.     ED Treatments / Results  Labs (all labs ordered are listed, but only abnormal results are displayed) Labs Reviewed  I-STAT CHEM 8, ED - Abnormal; Notable for the following components:      Result Value   Sodium 134 (*)    Glucose, Bld 128 (*)    Hemoglobin 11.9 (*)    HCT 35.0 (*)    All other components within normal limits  CBC WITH DIFFERENTIAL/PLATELET  I-STAT TROPONIN, ED    EKG  EKG Interpretation  Date/Time:  Wednesday January 29 2017 01:43:10 EST Ventricular Rate:  87 PR Interval:    QRS Duration: 99 QT Interval:  354 QTC Calculation: 426 R Axis:   17 Text Interpretation:  Sinus rhythm Inferior infarct, age indeterminate Confirmed by Dory Horn) on 01/29/2017 2:08:18 AM       Radiology Dg Chest Portable 1 View  Result Date: 01/29/2017 CLINICAL DATA:  History of coronary artery disease and diabetes. EXAM: PORTABLE CHEST 1 VIEW COMPARISON:  01/28/2013 FINDINGS: Postoperative changes in the mediastinum. Normal  heart size and pulmonary vascularity. No focal airspace disease or consolidation in the lungs. No blunting of costophrenic angles. No pneumothorax. Mediastinal contours appear intact. Calcification of the aorta. IMPRESSION: No evidence of active pulmonary disease.  Aortic atherosclerosis. Electronically Signed   By: Lucienne Capers M.D.   On: 01/29/2017 02:02    Procedures Procedures (including critical care time)  Medications Ordered in ED Medications  nitroGLYCERIN (NITROGLYN) 2 % ointment 1 inch (1 inch Topical Given 01/29/17 0217)       Final Clinical Impressions(s) / ED Diagnoses   Will admit to medicine for further work up as HEART SCORE is [place], Lior Hoen, MD 01/29/17 1610

## 2017-01-29 NOTE — ED Provider Notes (Signed)
Patient seen and evaluated by cardiology and felt stable for discharge home.  They have schedule outpatient stress testing.  Inpatient team Dr. Nevada Crane informed of the decision by cardiology.  She will write a discharge summary.  I have not personally been involved in the care of this patient   Jola Schmidt, MD 01/29/17 1034

## 2017-01-29 NOTE — Discharge Summary (Signed)
Discharge Summary  William Haney MGQ:[id number] DOB: 1951/02/24  PCP: Unk Pinto, MD  Admit date: 01/29/2017 Discharge date: 01/29/2017  Time spent: 25 minutes  Recommendations for Outpatient Follow-up:  1. Follow up with your cardiologist within a week 2. Follow up with your PCP within a week 3. Take your medications as prescribed  Discharge Diagnoses:  Active Hospital Problems   Diagnosis Date Noted  . Chest pain 01/29/2017  . DM type 2 (diabetes mellitus, type 2) ([place]) 09/08/2015  . Essential hypertension 10/11/2013    Resolved Hospital Problems  No resolved problems to display.    Discharge Condition: Stable  Diet recommendation: Cardiac healthy diet  Vitals:   01/29/17 0845 01/29/17 1051  BP: 124/74 117/70  Pulse: 61 (!) 57  Resp: 14 15  Temp:    SpO2: 97% 96%    History of present illness:   William Haney is a 66 y.o. male with history of CAD status post CABG, hypertension, hyperlipidemia and diabetes mellitus type 2 presents to the ER because of chest pain.  Patient states he has been having chest pain for last 2 months and is reproducible on deep palpation of the anterior chest wall.  However last night after eating dinner patient felt that his chest pain was radiating to the back and was little different from what it was before.  Denies any associated shortness of breath diaphoresis nausea vomiting or diarrhea.  ED Course: In the ER cardiac markers were negative chest x-ray unremarkable troponin was negative.  Chest pain improved with sublingual nitroglycerin and aspirin.  Patient admitted for further management of chest pain.   The patient was seen and examined at his bedside in the ED. He reports chest pain and back pain that is reproducible on palpation. 5/10 dull, non radiating and 10/10 tender when touched bilaterally. Not associated with any rashes. Not associated with dyspnea or palpitations.  Vital signs stable. EKG reviewed no ST T elevation.  Troponin negative. Cardiology evaluated and recommended outpatient stress test. The patient has a hx of CAD s/p CABG and reported left carotic stenosis. He follows with cardiology in the outpatient setting.  On the day of discharge the patient was hemodynamically stable.   Hospital Course:  Principal Problem:   Chest pain Active Problems:   Essential hypertension   DM type 2 (diabetes mellitus, type 2) ([place])  1. Chest pain most likely non cardiac related with history of CAD status post CABG -has atypical symptoms. Troponin, EKG negative for ischemia. Asa, zetia, enalapril, metoprolol. Reported allergy to statins- cardiology recommends outpatient stress test. 2. Diabetes mellitus type 2 - Resume current regimen 3. Hypertension on ACE inhibitors and metoprolol. 4. Hyperlipidemia on Zetia.  Procedures:  None  Consultations:  Cardiology   Discharge Exam: BP 117/70   Pulse (!) 57   Temp 98.2 F (36.8 C) (Oral)   Resp 15   SpO2 96%   General: 66 year old male well developed well nourished in NAD Cardiovascular: RRR with no rubs or gallops Respiratory: CTA with no wheezes or rhonchi   Discharge Instructions You were cared for by a hospitalist during your hospital stay. If you have any questions about your discharge medications or the care you received while you were in the hospital after you are discharged, you can call the unit and asked to speak with the hospitalist on call if the hospitalist that took care of you is not available. Once you are discharged, your primary care physician will handle any further medical issues.  Please note that NO REFILLS for any discharge medications will be authorized once you are discharged, as it is imperative that you return to your primary care physician (or establish a relationship with a primary care physician if you do not have one) for your aftercare needs so that they can reassess your need for medications and monitor your lab  values.   Allergies as of 01/29/2017      Reactions   Statins Other (See Comments)   Elevated LFT's      Medication List    TAKE these medications   aspirin 81 MG tablet Take 81 mg by mouth daily.   diclofenac 75 MG EC tablet Commonly known as:  VOLTAREN Take 75 mg by mouth 2 (two) times daily as needed for mild pain.   enalapril 10 MG tablet Commonly known as:  VASOTEC TAKE 1 TABLET BY MOUTH EVERY DAY What changed:    how much to take  how to take this  when to take this   ezetimibe 10 MG tablet Commonly known as:  ZETIA Take 1 tablet daily for Cholesterol What changed:    how much to take  how to take this  when to take this  additional instructions   glimepiride 4 MG tablet Commonly known as:  AMARYL TAKE 1 TABLET BY MOUTH TWICE A DAY What changed:    how much to take  how to take this  when to take this   metFORMIN 500 MG tablet Commonly known as:  GLUCOPHAGE TAKE 2 TABLETS BY MOUTH EVERY MORNING AND 2 EVERY EVENING What changed:    how much to take  how to take this  when to take this  additional instructions   metoprolol tartrate 25 MG tablet Commonly known as:  LOPRESSOR Take 1 tablet (25 mg total) by mouth 2 (two) times daily. NEED OV.   multivitamin tablet Take 1 tablet by mouth daily.      Allergies  Allergen Reactions  . Statins Other (See Comments)    Elevated LFT's   Follow-up Information    [place], Evelene Croon, PA-C Follow up.   Specialties:  Cardiology, Radiology Why:  On Tuesday December 18th at 1:30 for cardiology follow up with Dr. Rosezella Florida PA.  Contact information: [address] [place] [address] [phone]        [facility] Follow up.   Specialty:  Cardiology Why:  You have a carotid artery ultrasound scheduled for November 27th at 3:30.  Contact information: [address] [place]       Unk Pinto, MD.    Specialty:  Internal Medicine Contact information: [address] [place] [address] [phone]        Schedule an appointment as soon as possible for a visit  with [facility].   Contact information: [address] [phone]           The results of significant diagnostics from this hospitalization (including imaging, microbiology, ancillary and laboratory) are listed below for reference.    Significant Diagnostic Studies: Dg Chest Portable 1 View  Result Date: 01/29/2017 CLINICAL DATA:  History of coronary artery disease and diabetes. EXAM: PORTABLE CHEST 1 VIEW COMPARISON:  01/28/2013 FINDINGS: Postoperative changes in the mediastinum. Normal heart size and pulmonary vascularity. No focal airspace disease or consolidation in the lungs. No blunting of costophrenic angles. No pneumothorax. Mediastinal contours appear intact. Calcification of  the aorta. IMPRESSION: No evidence of active pulmonary disease.  Aortic atherosclerosis. Electronically Signed   By: Lucienne Capers M.D.   On: 01/29/2017 02:02    Microbiology: No results found for this or any previous visit (from the past 240 hour(s)).   Labs: Basic Metabolic Panel: Recent Labs  Lab 01/29/17 0224  NA 134*  K 4.3  CL 101  GLUCOSE 128*  BUN 11  CREATININE 0.90   Liver Function Tests: No results for input(s): AST, ALT, ALKPHOS, BILITOT, PROT, ALBUMIN in the last 168 hours. No results for input(s): LIPASE, AMYLASE in the last 168 hours. No results for input(s): AMMONIA in the last 168 hours. CBC: Recent Labs  Lab 01/29/17 0210 01/29/17 0224  WBC 6.2  --   NEUTROABS 3.5  --   HGB 10.1* 11.9*  HCT 31.3* 35.0*  MCV 62.0*  --   PLT 255  --    Cardiac Enzymes: Recent Labs  Lab 01/29/17 0843  TROPONINI 0.03*   BNP: BNP (last 3 results) No results for input(s): BNP in the last 8760 hours.  ProBNP (last 3 results) No  results for input(s): PROBNP in the last 8760 hours.  CBG: Recent Labs  Lab 01/29/17 0714  GLUCAP 135*       Signed:  Kayleen Memos, MD [facility] 01/29/2017, 12:15 PM

## 2017-01-29 NOTE — ED Notes (Signed)
Patient ambulated to restroom independently.  Gait steady and even.

## 2017-01-29 NOTE — Consult Note (Addendum)
Cardiology Consultation:   Patient ID: William Haney; [id number]; Aug 22, 1950   Admit date: 01/29/2017 Date of Consult: 01/29/2017  Primary Care Provider: Unk Pinto, MD Primary Cardiologist: Dr. Percival Spanish   Patient Profile:   William Haney is a 66 y.o. male with a hx of CAD S/P CABG 2008, HTN, HLD, DM2,  who is being seen today for the evaluation of chest pain at the request of Dr. Nevada Crane.  History of Present Illness:   William Haney has had no cardiac issues since his bypass in 2008. He has been having chest pain for the last 2 months that was reproducible on deep palpation, however, last night after diner he developed chest pain that radiated to his back and was different than before. He tells me that the pain started in his posterior left shoulder and went down his left arm. He never had anterior chest pain. He has been having anterior chest, shoulder and upper back pain for the last 6 months that is tender to touch and has been evaluated by his PCP and thought to be muscular.This pain was relieved with a heating pad.  At about 10:00 last night the pain was severe and did not improve with heating pad as previously. This was associated with body shakes for about 20 minutes per his daughters. No other associated dyspnea, nausea, lightheadedness, palpitations, diaphoresis. It was constant and improved in the ED. He is still tender to touch in the the posterior left shoulder and arm. He is very active at home and tries to exercise in his home. He did about 100 pushups yesterday. He has not been walking for exercise due to knee pain, but he is able to climb stairs, mow his own lawn and work around the house without any exertional symptoms. He denies orthopnea, PND or edema. He feels that he has had no "internal" chest discomfort, only what he can reproduce with pushing on his skin. He denies chest pressure or squeezing.   He quit smoking in 2007 and does not drink alcohol. He says that he drinks too  much coffee, about 1-2 pots per day. He is concerned about his carotid artery stenosis as he is overdue to re imaging. He has had occ lightheadedness upon standing about 1-2 times per week, no syncope or presyncope.   He was last seen by Dr. Percival Spanish in the office on 02/10/2015 at which time he was doing well without any symptoms. His carotid dopplers were rechecked and showed stable right carotid artery stenosis.   Significant findings: -troponin 0.00 -K+ 4.3 Creatinine 0.90 Hgb 11.9 CXR: no evidence of active pulmonary disease. Aortic atherosclerosis.  Past Medical History:  Diagnosis Date  . Anemia   . Carotid artery stenosis    60 %-80 %  . Coronary artery disease    s/p CABG in 2008  . Diabetes mellitus type II   . Dyslipidemia   . Hyperlipidemia   . Hypertension   . Vitamin D deficiency     Past Surgical History:  Procedure Laterality Date  . CORONARY ARTERY BYPASS GRAFT  2008   LIMA to the LAD, saphenous vein graft to diagonal, saphenous vein graft to OM, saphenous vein graft to posterolateral 2008     Home Medications:  Prior to Admission medications   Medication Sig Start Date End Date Taking? Authorizing Provider  aspirin 81 MG tablet Take 81 mg by mouth daily.   Yes [provider]  diclofenac (VOLTAREN) 75 MG EC tablet Take 75 mg by mouth 2 (  two) times daily as needed for mild pain.    Yes [provider]  enalapril (VASOTEC) 10 MG tablet TAKE 1 TABLET BY MOUTH EVERY DAY Patient taking differently: TAKE 10mg  BY MOUTH EVERY DAY 05/15/16  Yes [name], PA-C  ezetimibe (ZETIA) 10 MG tablet Take 1 tablet daily for Cholesterol Patient taking differently: Take 10 mg by mouth daily.  01/09/17  Yes Unk Pinto, MD  glimepiride (AMARYL) 4 MG tablet TAKE 1 TABLET BY MOUTH TWICE A DAY Patient taking differently: TAKE 4mg  BY MOUTH TWICE A DAY 08/12/16  Yes [name], PA-C  metFORMIN (GLUCOPHAGE) 500 MG tablet TAKE 2 TABLETS BY MOUTH EVERY  MORNING AND 2 EVERY EVENING Patient taking differently: Take 1,000 mg by mouth 2 (two) times daily with a meal.  04/23/16  Yes Unk Pinto, MD  metoprolol tartrate (LOPRESSOR) 25 MG tablet Take 1 tablet (25 mg total) by mouth 2 (two) times daily. NEED OV. 01/09/17  Yes Minus Breeding, MD  Multiple Vitamin (MULTIVITAMIN) tablet Take 1 tablet by mouth daily.   Yes [provider]    Inpatient Medications: Scheduled Meds: . aspirin EC  325 mg Oral Daily  . enalapril  10 mg Oral Daily  . enoxaparin (LOVENOX) injection  40 mg Subcutaneous Q24H  . ezetimibe  10 mg Oral Daily  . insulin aspart  0-9 Units Subcutaneous TID WC  . metoprolol tartrate  25 mg Oral BID   Continuous Infusions:  PRN Meds: acetaminophen, ondansetron (ZOFRAN) IV  Allergies:    Allergies  Allergen Reactions  . Statins Other (See Comments)    Elevated LFT's    Social History:   Social History   Socioeconomic History  . Marital status: Married    Spouse name: Not on file  . Number of children: Not on file  . Years of education: Not on file  . Highest education level: Not on file  Social Needs  . Financial resource strain: Not on file  . Food insecurity - worry: Not on file  . Food insecurity - inability: Not on file  . Transportation needs - medical: Not on file  . Transportation needs - non-medical: Not on file  Occupational History  . Not on file  Tobacco Use  . Smoking status: Former Smoker    Last attempt to quit: 08/29/1995    Years since quitting: 21.4  . Smokeless tobacco: Never Used  Substance and Sexual Activity  . Alcohol use: No  . Drug use: No  . Sexual activity: Yes  Other Topics Concern  . Not on file  Social History Narrative  . Not on file    Family History:    Family History  Problem Relation Age of Onset  . Hypertension Father   . Heart disease Father   . Hyperlipidemia Father   . Heart attack Father   . Diabetes Sister   . Heart disease Brother      ROS:   Please see the history of present illness.  ROS  All other ROS reviewed and negative.     Physical Exam/Data:   Vitals:   01/29/17 0545 01/29/17 0615 01/29/17 0700 01/29/17 0715  BP: 98/69 107/65 121/65 123/84  Pulse: 65 69 62 76  Resp: 19 16 16    Temp:      TempSrc:      SpO2: 100% 98% 98% 95%   No intake or output data in the 24 hours ending 01/29/17 0826 There were no vitals filed for this visit. There is  no height or weight on file to calculate BMI.  General:  Well nourished, well developed, in no acute distress HEENT: normal Lymph: no adenopathy Neck: no JVD Endocrine:  No thryomegaly Vascular: No carotid bruits; FA pulses 2+ bilaterally without bruits  Cardiac:  normal S1, S2; RRR; no murmur, mid sternal scar Lungs:  clear to auscultation bilaterally, no wheezing, rhonchi or rales  Abd: soft, nontender, no hepatomegaly  Ext: no edema Musculoskeletal:  No deformities, BUE and BLE strength normal and equal, tenderness to palpitation, left shoulder and arm Skin: warm and dry  Neuro:  CNs 2-12 intact, no focal abnormalities noted Psych:  Normal affect   EKG:  The EKG was personally reviewed and demonstrates:  Sinus rhythm at 87 bpm, TWI in leads II,III,AVF,V5,V6 similar to 02/2015 Telemetry:  Telemetry was personally reviewed and demonstrates:  Sinus rhythm 60's-70's  Relevant CV Studies:  Carotid duplex 02/27/15 Heterogeneous plaque, bilaterally. Stable 60-79% RICA stenosis. Stable 2-72% LICA stenosis. Normal subclavian arteries, bilaterally. Patent vertebral arteries with antegrade flow. F/U 1 year.  Laboratory Data:  Chemistry Recent Labs  Lab 01/29/17 0224  NA 134*  K 4.3  CL 101  GLUCOSE 128*  BUN 11  CREATININE 0.90    No results for input(s): PROT, ALBUMIN, AST, ALT, ALKPHOS, BILITOT in the last 168 hours. Hematology Recent Labs  Lab 01/29/17 0210 01/29/17 0224  WBC 6.2  --   RBC 5.05  --   HGB 10.1* 11.9*  HCT 31.3* 35.0*  MCV 62.0*  --    MCH 20.0*  --   MCHC 32.3  --   RDW 16.1*  --   PLT 255  --    Cardiac EnzymesNo results for input(s): TROPONINI in the last 168 hours.  Recent Labs  Lab 01/29/17 0222  TROPIPOC 0.00    BNPNo results for input(s): BNP, PROBNP in the last 168 hours.  DDimer No results for input(s): DDIMER in the last 168 hours.  Radiology/Studies:  Dg Chest Portable 1 View  Result Date: 01/29/2017 CLINICAL DATA:  History of coronary artery disease and diabetes. EXAM: PORTABLE CHEST 1 VIEW COMPARISON:  01/28/2013 FINDINGS: Postoperative changes in the mediastinum. Normal heart size and pulmonary vascularity. No focal airspace disease or consolidation in the lungs. No blunting of costophrenic angles. No pneumothorax. Mediastinal contours appear intact. Calcification of the aorta. IMPRESSION: No evidence of active pulmonary disease.  Aortic atherosclerosis. Electronically Signed   By: Lucienne Capers M.D.   On: 01/29/2017 02:02    Assessment and Plan:   1. Chest pain  -Hx CABG 2008, no recent diagnostic evaluations -symptoms are very atypical, pain in the left shoulder/arm reproducible with palpation. No associated symptoms except for shaking during the most intense pain. The patient has been doing chest exercises, including 100 pushups yesterday.  -First troponin negative -EKG unchanged from previous -CXR without abnormalities -the patient's pain does not have characteristics of myocardial ischemia, is musculoskeletal. -Can be discharged from cardiac standpoint. Will arrange for outpatient follow up with Dr. Percival Spanish as pt is over due.  -Pt advised not to do pushups, can do other forms of exercise like walking, riding stationary bike  2. CAD -HX CABG 2008, no recent studies -On aspirin 81 mg, BB, zetia, no statin due to cost and myalgia  3. Hypertension -Home meds include enalapril 10 mg and metoprolol 25 mg bid -BP well controlled  4. HLD -No statin due to myalgias. On Xetia 10 mg -Lipid  panel on 01/08/17: TChol 180, LDL 114, HDL  38 -Goal LDL <70 in setting of CAD. Recommend cardiology follow up and lipid clinic for possible PCSK9 inhibitor  5. Diabetes type 2 -On oral meds -Hemoglobin A1c 9.2 on 01/08/17. Poorly controlled -Will need to work with PCP for better blood sugar control to reduce CVD risk- discussed with pt  6. Carotid artery stenosis -Right 50-69% in 2015, 60-79% in 2016 -Patient needs follow up carotid dopplers-I will arrange.  7. Anemia -Hgb 10.0, MCV 62.0 -Pt advised to follow up with his PCP  For questions or updates, please contact [place] Please consult [email or web address] for contact info under Cardiology/STEMI.   Signed, Daune Perch, NP  01/29/2017 8:26 AM   As above, patient seen and examined. Briefly he is a 66 year old male with past medical history of coronary artery disease status post coronary artery bypassing graft, diabetes mellitus, hypertension, hyperlipidemia, anemia for evaluation of chest pain. Patient states he has had intermittent chest pain for the past 5-6 months. It is in the substernal and left shoulder area. It increases with palpation, left upper extremity movements and pushups. No associated symptoms. Resolves spontaneously. He he otherwise denies exertional chest pain, dyspnea on exertion, orthopnea, PND or pedal edema. His pain increased last evening and he presented to the emergency room. Troponin normal. Chest x-ray with no acute infiltrate. Electrocardiogram showed sinus rhythm with prior inferior infarct and inferolateral T-wave inversion unchanged compared to previous. Hemoglobin 11.3 with MCV 66.  1 chest pain-symptoms are most consistent with musculoskeletal pain. It is reproduced with palpation and moving LUE. I do not think this is cardiac related. His enzymes are negative and electrocardiogram unchanged. I have asked him to refrain from pushups for now to see if this improves. I do not think he requires further  ischemia evaluation.  2 Microcytic anemia-management per primary care. He may require colonoscopy.  3 history of carotid artery disease-We will arrange follow-up carotid Dopplers and then follow-up with Dr. Percival Spanish.  4 hyperlipidemia-he is statin intolerant. He will need follow-up in the lipid clinic for consideration of repatha.  5 diabetes mellitus-management per primary care.  Continue present medications at discharge. We will sign off. Please call with questions.  Kirk Ruths, MD

## 2017-01-29 NOTE — ED Triage Notes (Signed)
Per [facility],  Pt reports CP, intermittently for the past several months. Around 9 pm, the pain became worse in the center of his chest, radiates to jaw and L arm. Pt reports the pain is sharp and pressure-like. Pt reports some nausea. Pt received 324 ASA and 2 nitro en route. Pt pain decreased from 10 to 5 with meds.

## 2017-02-04 ENCOUNTER — Telehealth: Payer: Self-pay | Admitting: *Deleted

## 2017-02-04 ENCOUNTER — Ambulatory Visit (HOSPITAL_COMMUNITY)
Admit: 2017-02-04 | Discharge: 2017-02-04 | Disposition: A | Discharge status: Home / Self Care | Payer: Medicare Other | Source: Ambulatory Visit | Attending: Cardiology | Admitting: Cardiology

## 2017-02-04 DIAGNOSIS — E119 Type 2 diabetes mellitus without complications: Secondary | ICD-10-CM | POA: Diagnosis not present

## 2017-02-04 DIAGNOSIS — E785 Hyperlipidemia, unspecified: Secondary | ICD-10-CM | POA: Diagnosis not present

## 2017-02-04 DIAGNOSIS — Z87891 Personal history of nicotine dependence: Secondary | ICD-10-CM | POA: Insufficient documentation

## 2017-02-04 DIAGNOSIS — I2510 Atherosclerotic heart disease of native coronary artery without angina pectoris: Secondary | ICD-10-CM | POA: Diagnosis not present

## 2017-02-04 DIAGNOSIS — I6521 Occlusion and stenosis of right carotid artery: Secondary | ICD-10-CM

## 2017-02-04 DIAGNOSIS — I6523 Occlusion and stenosis of bilateral carotid arteries: Secondary | ICD-10-CM | POA: Insufficient documentation

## 2017-02-04 DIAGNOSIS — I10 Essential (primary) hypertension: Secondary | ICD-10-CM | POA: Insufficient documentation

## 2017-02-04 NOTE — Progress Notes (Signed)
Assessment and Plan:  Diagnoses and all orders for this visit:  Encounter for examination following treatment at hospital Chest pain - non ischemic - possible costochondritis - he will stop pushups until pain resolved - OTC analgesics as needed  Atherosclerosis of native coronary artery of native heart without angina pectoris Discharged after observation for nonischemic chest pain; follow up established with cardiology Continue close follow up for htn, lipids, T2DM, continue ASA  Essential hypertension Well controlled; continue medications Monitor blood pressure at home; call if consistently over 130/80 Continue DASH diet.   Reminder to go to the ER if any CP, SOB, nausea, dizziness, severe HA, changes vision/speech, left arm numbness and tingling and jaw pain.  Type 2 diabetes mellitus with stage 1 chronic kidney disease, without long-term current use of insulin (HCC) Currently achieving poor control despite long discussions about lifestyle and prescription of medications Discussed risks of poor control with patient and disease course Currently prescribed: metformin 500 mg 2 tabs BID, glimepiride 4 mg BID - starting cinnamon 1000 mg BID today  -- if poor control continues strongly consider initiating on insulin therapy Continue to encourage diet and exercise- patient highly motivated to improve diet Patient will start keeping log of sugars Perform daily foot/skin check, notify office of any concerning changes.   Hyperlipidemia Historically well controlled with zetia 10 mg; intolerant of statins; consider fibrate should triglycerides trend up Continue low cholesterol diet and exercise.   Further disposition pending results of labs. Discussed med's effects and SE's.   Over 30 minutes of exam, counseling, chart review, and critical decision making was performed.   Future Appointments  Date Time Provider [place]  02/25/2017  1:30 PM Barrett, Felisa Bonier [facility]  [facility]  04/10/2017  9:00 AM [name], PA-C [id number] None    ------------------------------------------------------------------------------------------------------------------   HPI BP 118/70   Pulse 73   Temp (!) 97.5 F (36.4 C)   Ht 5\' 1"  (1.549 m)   Wt 144 lb (65.3 kg)   SpO2 99%   BMI 27.21 kg/m   66 y.o.male withhistory of CAD status post CABG in 2008, hypertension, hyperlipidemia and diabetes mellitus type 2 presents to follow up from ER visit with observation for angina. Troponin and EKG were negative for ischemia in ER; They concluded chest pain most likely non cardiac related though cardiology was consulted and recommended outpatient stress test. He was scheduled for carotid artery [place] and presented for this yesterday- no significant changes from previous.  He is previously established with Dr. Casimer Lanius and is scheduled for follow up 02/25/2017.   Today he describes the pain as symmetrical through both sides of his chest, somewhat through shoulders and upper back as well - he reports the areas are tender to touch. He reports he had been doing 50-100 pushups daily prior to this pain - he has since stopped and feels pain/tenderness is improving. Denies SOB/dizziness, N/V, pain worse with exertion.   His blood pressures have been well controlled, today their BP is BP: 118/70 He denies chest pain, shortness of breath, dizziness.   He is on cholesterol medication and denies myalgias. His cholesterol is not at goal for last visit, though review of trends show the patient is generally well controlled with current treatment of zetia 10 mg. The cholesterol last visit was:   Lab Results  Component Value Date   CHOL 180 01/08/2017   HDL 38 (L) 01/08/2017   LDLCALC 99 10/08/2016   TRIG 157 (H) 01/08/2017   CHOLHDL  4.7 01/08/2017    He has been working on diet and exercise for type 2 diabetes, and denies hypoglycemia , increased appetite, nausea, paresthesia of the feet,  polydipsia and polyuria. Last A1C in the office was:  Lab Results  Component Value Date   HGBA1C 9.2 (H) 01/08/2017  He does check fasting blood sugars - 130-140 range. He is aware that his A1C was elevated; he reports this is related to many weddings/parties he attended this past summer.    Past Medical History:  Diagnosis Date  . Anemia   . Carotid artery stenosis    60 %-80 %  . Coronary artery disease    s/p CABG in 2008  . Diabetes mellitus type II   . Dyslipidemia   . Hyperlipidemia   . Hypertension   . Vitamin D deficiency      Allergies  Allergen Reactions  . Statins Other (See Comments)    Elevated LFT's    Current Outpatient Medications on File Prior to Visit  Medication Sig  . aspirin 81 MG tablet Take 81 mg by mouth daily.  . diclofenac (VOLTAREN) 75 MG EC tablet Take 75 mg by mouth 2 (two) times daily as needed for mild pain.   [name] enalapril (VASOTEC) 10 MG tablet TAKE 1 TABLET BY MOUTH EVERY DAY (Patient taking differently: TAKE 10mg  BY MOUTH EVERY DAY)  . ezetimibe (ZETIA) 10 MG tablet Take 1 tablet daily for Cholesterol (Patient taking differently: Take 10 mg by mouth daily. )  . glimepiride (AMARYL) 4 MG tablet TAKE 1 TABLET BY MOUTH TWICE A DAY (Patient taking differently: TAKE 4mg  BY MOUTH TWICE A DAY)  . metFORMIN (GLUCOPHAGE) 500 MG tablet TAKE 2 TABLETS BY MOUTH EVERY MORNING AND 2 EVERY EVENING (Patient taking differently: Take 1,000 mg by mouth 2 (two) times daily with a meal. )  . metoprolol tartrate (LOPRESSOR) 25 MG tablet Take 1 tablet (25 mg total) by mouth 2 (two) times daily. NEED OV.  . Multiple Vitamin (MULTIVITAMIN) tablet Take 1 tablet by mouth daily.   No current facility-administered medications on file prior to visit.     ROS: All negative on 10 point except above  Physical Exam:  BP 118/70   Pulse 73   Temp (!) 97.5 F (36.4 C)   Ht 5\' 1"  (1.549 m)   Wt 144 lb (65.3 kg)   SpO2 99%   BMI 27.21 kg/m   General Appearance: Well  nourished, in no apparent distress. Eyes: PERRLA, EOMs, conjunctiva no swelling or erythema Sinuses: No Frontal/maxillary tenderness ENT/Mouth: Ext aud canals clear, TMs without erythema, bulging. No erythema, swelling, or exudate on post pharynx.  Tonsils not swollen or erythematous. Hearing normal.  Neck: Supple, thyroid normal.  Respiratory: Respiratory effort normal, BS equal bilaterally without rales, rhonchi, wheezing or stridor.  Cardio: RRR with no MRGs. Brisk peripheral pulses without edema.  Abdomen: Soft, + BS.  Non tender, no guarding, rebound, hernias, masses. Lymphatics: Non tender without lymphadenopathy.  Musculoskeletal: Full ROM, 5/5 strength, normal gait. Tender to palpation parasternal, through pectoral muscles and shoulders.  Skin: Warm, dry without rashes, lesions, ecchymosis.  Neuro: Cranial nerves intact. Normal muscle tone, no cerebellar symptoms. Sensation intact.  Psych: Awake and oriented X 3, normal affect, Insight and Judgment appropriate.     Izora Ribas, NP 9:27 AM [facility]

## 2017-02-04 NOTE — Telephone Encounter (Signed)
SCHEDULED PT FOR A HOSP F/U ON 02/05/17

## 2017-02-05 ENCOUNTER — Encounter: Payer: Self-pay | Admitting: Adult Health

## 2017-02-05 ENCOUNTER — Ambulatory Visit (INDEPENDENT_AMBULATORY_CARE_PROVIDER_SITE_OTHER): Payer: Medicare Other | Admitting: Adult Health

## 2017-02-05 VITALS — BP 118/70 | HR 73 | Temp 97.5°F | Ht 61.0 in | Wt 144.0 lb

## 2017-02-05 DIAGNOSIS — E1122 Type 2 diabetes mellitus with diabetic chronic kidney disease: Secondary | ICD-10-CM | POA: Diagnosis not present

## 2017-02-05 DIAGNOSIS — N181 Chronic kidney disease, stage 1: Secondary | ICD-10-CM

## 2017-02-05 DIAGNOSIS — Z09 Encounter for follow-up examination after completed treatment for conditions other than malignant neoplasm: Secondary | ICD-10-CM | POA: Diagnosis not present

## 2017-02-05 DIAGNOSIS — I2510 Atherosclerotic heart disease of native coronary artery without angina pectoris: Secondary | ICD-10-CM | POA: Diagnosis not present

## 2017-02-05 DIAGNOSIS — I10 Essential (primary) hypertension: Secondary | ICD-10-CM

## 2017-02-05 DIAGNOSIS — E782 Mixed hyperlipidemia: Secondary | ICD-10-CM | POA: Diagnosis not present

## 2017-02-05 MED ORDER — CINNAMON 500 MG PO CAPS: 1000.0000 mg | ORAL_CAPSULE | Freq: Two times a day (BID) | ORAL | 3 refills | Status: DC

## 2017-02-05 NOTE — Patient Instructions (Signed)
Start cinnamon supplement - 1000 mg twice a day  Please make sure you continue to decrease bad carbs like white bread, white rice, potatoes, corn, soft drinks, pasta, cereals, refined sugars, sweet tea, dried fruits, and fruit juice. Good carbs are okay to eat in moderation like sweet potatoes, brown rice, whole grain pasta/bread, most fruit (except dried fruit) and you can eat as many veggies as you want.      Bad carbs also include fruit juice, alcohol, and sweet tea. These are empty calories that do not signal to your brain that you are full.   Please remember the good carbs are still carbs which convert into sugar. So please measure them out no more than 1/2-1 cup of rice, oatmeal, pasta, and beans  Veggies are however free foods! Pile them on.   Not all fruit is created equal. Please see the list below, the fruit at the bottom is higher in sugars than the fruit at the top. Please avoid all dried fruits.

## 2017-02-10 ENCOUNTER — Other Ambulatory Visit: Payer: Self-pay | Admitting: Internal Medicine

## 2017-02-20 ENCOUNTER — Encounter: Payer: Self-pay | Admitting: *Deleted

## 2017-02-25 ENCOUNTER — Ambulatory Visit: Payer: Medicare Other | Admitting: Physician Assistant

## 2017-02-25 ENCOUNTER — Encounter: Payer: Self-pay | Admitting: Physician Assistant

## 2017-02-25 VITALS — BP 124/80 | HR 76 | Ht 61.0 in | Wt 141.4 lb

## 2017-02-25 DIAGNOSIS — I2510 Atherosclerotic heart disease of native coronary artery without angina pectoris: Secondary | ICD-10-CM | POA: Diagnosis not present

## 2017-02-25 DIAGNOSIS — E782 Mixed hyperlipidemia: Secondary | ICD-10-CM

## 2017-02-25 DIAGNOSIS — R0789 Other chest pain: Secondary | ICD-10-CM

## 2017-02-25 NOTE — Patient Instructions (Signed)
Medication Instructions: Your physician recommends that you continue on your current medications as directed. Please refer to the Current Medication list given to you today.  If you need a refill on your cardiac medications before your next appointment, please call your pharmacy.   Follow-Up: Your physician wants you to follow-up in 12 months with Dr. Hochrein. You will receive a reminder letter in the mail two months in advance. If you don't receive a letter, please call our office at [phone] to schedule this follow-up appointment.   Thank you for choosing [facility]!!      

## 2017-02-25 NOTE — Progress Notes (Signed)
Cardiology Office Note   Date:  02/25/2017   ID:  William Haney, DOB Apr 06, 1950, MRN [id number]  PCP:  Unk Pinto, MD  Cardiologist: Dr. Percival Spanish, 02/10/2015 Rosaria Ferries, PA-C   Chief Complaint  Patient presents with  . Follow-up    History of Present Illness: William Haney is a 67 y.o. male with a history of CABG 2008 w/ LIMA-LAD, SVG-Diag, SVG-OM, SVG-PL, carotid dz w/ R-ICA 40-59 & L-ICA1-39% doppler 02/04/2017, DM, HTN, HLD, Vit D def, anemia  Admitted 11/21 with chest pain, seen by cardiology and symptoms felt atypical, ECG unchanged, troponin normal, discharged with outpatient follow-up arranged  William Haney presents for cardiology follow up.  The pain he was having went away when he quit doing 100-150 pushups per night. He walks when he can, but his knees hurt at times, limiting him. He may be able to get access to a gym through his daughter.   He still has chest wall tenderness near his sternum.   He was not getting SOB with the pushups or with walking.   He does not get any exertional sx. Sleeps well. No LE edema, no orthopnea or PND.  He is working closely w/ PCP on improving his diabetes. He has cut out rice and eats only wheat bread.   His PCP follows his cholesterol, last check 12/2016.   He has Voltaren prescribed, but only takes it after the pain has started.  He does not pretreat his knee pain with anything when he plans to go walking and does not take anything on a daily basis.  His blood pressure is up today, but on review of other office visits, it is generally at goal.   Past Medical History:  Diagnosis Date  . Anemia   . Carotid artery stenosis 01/2017   L-ICA 1-39%, R-ICA 40-59% doppler  . Coronary artery disease    s/p CABG in 2008, w/ LIMA-LAD, SVG-Diag, SVG-OM, SVG-PL  . Diabetes mellitus type II   . Hyperlipidemia   . Hypertension   . Vitamin D deficiency     Past Surgical History:  Procedure Laterality Date  . CORONARY  ARTERY BYPASS GRAFT  2008   LIMA to the LAD, saphenous vein graft to diagonal, saphenous vein graft to OM, saphenous vein graft to posterolateral 2008    Current Outpatient Medications  Medication Sig Dispense Refill  . aspirin 81 MG tablet Take 81 mg by mouth daily.    . Cinnamon 500 MG capsule Take 2 capsules (1,000 mg total) by mouth 2 (two) times daily. 60 capsule 3  . diclofenac (VOLTAREN) 75 MG EC tablet Take 75 mg by mouth 2 (two) times daily as needed for mild pain.     [name] enalapril (VASOTEC) 10 MG tablet TAKE 1 TABLET BY MOUTH EVERY DAY (Patient taking differently: TAKE 10mg  BY MOUTH EVERY DAY) 30 tablet 11  . ezetimibe (ZETIA) 10 MG tablet Take 1 tablet daily for Cholesterol (Patient taking differently: Take 10 mg by mouth daily. ) 90 tablet 1  . glimepiride (AMARYL) 4 MG tablet TAKE 1 TABLET BY MOUTH TWICE A DAY (Patient taking differently: TAKE 4mg  BY MOUTH TWICE A DAY) 180 tablet 0  . metFORMIN (GLUCOPHAGE) 500 MG tablet TAKE 2 TABLETS BY MOUTH EVERY MORNING AND 2 EVERY EVENING 360 tablet 2  . metoprolol tartrate (LOPRESSOR) 25 MG tablet Take 1 tablet (25 mg total) by mouth 2 (two) times daily. NEED OV. 15 tablet 0  . Multiple Vitamin (MULTIVITAMIN) tablet Take 1 tablet  by mouth daily.     No current facility-administered medications for this visit.     Allergies:   Statins    Social History:  The patient  reports that he quit smoking about 21 years ago. he has never used smokeless tobacco. He reports that he does not drink alcohol or use drugs.   Family History:  The patient's family history includes Diabetes in his sister; Heart attack in his father; Heart disease in his brother and father; Hyperlipidemia in his father; Hypertension in his father.    ROS:  Please see the history of present illness. All other systems are reviewed and negative.    PHYSICAL EXAM: VS:  BP (!) 148/72   Pulse 83   Ht 5\' 1"  (1.549 m)   Wt 141 lb 6.4 oz (64.1 kg)   BMI 26.72 kg/m  , BMI Body  mass index is 26.72 kg/m. GEN: Well nourished, well developed, male in no acute distress  HEENT: normal for age  Neck: no JVD, no carotid bruit, no masses Cardiac: RRR; soft murmur, no rubs, or gallops Respiratory:  clear to auscultation bilaterally, normal work of breathing GI: soft, nontender, nondistended, + BS MS: no deformity or atrophy; no edema; distal pulses are 2+ in all 4 extremities   Skin: warm and dry, no rash Neuro:  Strength and sensation are intact Psych: euthymic mood, full affect   EKG:  EKG is not ordered today.   Recent Labs: 01/08/2017: ALT 15; Magnesium 1.7 01/29/2017: BUN 11; Creatinine, Ser 0.90; Hemoglobin 11.9; Platelets 255; Potassium 4.3; Sodium 134; TSH 3.434    Lipid Panel    Component Value Date/Time   CHOL 180 01/08/2017 1041   TRIG 157 (H) 01/08/2017 1041   HDL 38 (L) 01/08/2017 1041   CHOLHDL 4.7 01/08/2017 1041   VLDL 24 10/08/2016 1614   LDLCALC 99 10/08/2016 1614     Wt Readings from Last 3 Encounters:  02/25/17 141 lb 6.4 oz (64.1 kg)  02/05/17 144 lb (65.3 kg)  01/08/17 141 lb 12.8 oz (64.3 kg)     Other studies Reviewed: Additional studies/ records that were reviewed today include: Office notes, hospital records and testing.  ASSESSMENT AND PLAN:  1.  Chest pain: His symptoms were atypical, and have improved when he quit doing push-ups.  He has not had any chest pain when doing lower body exertion such as walking.  He has chest wall tenderness.  He is encouraged to take the Voltaren every morning with food for a while to see if this helps both the pain in his chest and the pain in his knees.  I believe he will be able to walk more often if he pretreats his knee pain.  2.  Diabetes: He is working with his PCP to get his A1c down and improve dietary compliance.  He is compliant with medications.  I believe the exercise is very important towards managing his diabetes.  3.  Dyslipidemia: His goal LDL is 70, but it was 99 when  recently checked.  This is followed by his PCP.  He is compliant with Zetia.  I mentioned a PC SK 9 inhibitor, but he is not interested.  4.  CAD: He is not having any ischemic symptoms.  He is on good therapy with baby aspirin, a beta-blocker and ACE inhibitor.     Current medicines are reviewed at length with the patient today.  The patient does not have concerns regarding medicines.  The following changes have been  made:  no change  Labs/ tests ordered today include:  No orders of the defined types were placed in this encounter.    Disposition:   FU with Dr. Percival Spanish  Signed, Rosaria Ferries, PA-C  02/25/2017 1:54 PM    [place] Phone: [phone]; Fax: [phone]  This note was written with the assistance of speech recognition software. Please excuse any transcriptional errors.

## 2017-03-25 ENCOUNTER — Other Ambulatory Visit: Payer: Self-pay | Admitting: Physician Assistant

## 2017-04-03 ENCOUNTER — Encounter: Payer: Self-pay | Admitting: Physician Assistant

## 2017-04-07 NOTE — Progress Notes (Signed)
Complete Physical  Assessment and Plan: Essential hypertension - continue medications, DASH diet, exercise and monitor at home. Call if greater than 130/80.  STOP ACE DUE TO COUGH, START MICARDIS, MONITOR BP - CBC with Differential/Platelet - BASIC METABOLIC PANEL WITH GFR - Hepatic function panel - TSH - Urinalysis, Routine w reflex microscopic (not at [facility]) - Microalbumin / creatinine urine ratio  Atherosclerosis of native coronary artery of native heart without angina pectoris Control blood pressure, cholesterol, glucose, increase exercise.  Continue cardio follow up   Carotid stenosis, bilateral Control blood pressure, cholesterol, glucose, increase exercise.   Type 2 diabetes mellitus with other circulatory complication (HCC) Discussed general issues about diabetes pathophysiology and management., Educational material distributed., Suggested low cholesterol diet., Encouraged aerobic exercise., Discussed foot care., Reminded to get yearly retinal exam. - Hemoglobin A1c - WILL START WELCHOL FOR CHOL AND SUGARS - LONG DISCUSSION DIET   Hyperlipidemia -continue medications, check lipids, decrease fatty foods, increase activity.  - Lipid panel - ADD ON WELCHOL, CONTINUE ZETIA, RESISTANT TO RESTART STATIN DUE TO MYALGIAS BUT IF WELCHOL DOES NOT BRING TO GOAL MAY RESTART LOW DOSE EVERY OTHER DAY   Anemia, unspecified anemia type - monitor, continue iron supp with Vitamin C and increase green leafy veggies   Medication management - Magnesium   Vitamin D deficiency - VITAMIN D 25 Hydroxy (Vit-D Deficiency, Fractures)   Encounter for general adult medical examination with abnormal findings 1 YEAR  Diabetic polyneuropathy associated with type 2 diabetes mellitus (HCC) -     Hemoglobin A1c  Type 2 diabetes mellitus with stage 1 chronic kidney disease, without long-term current use of insulin (HCC) -     Hemoglobin A1c - GETTING EYE EXAM FROM DR. Sherral Hammers  BMI 27.0-27.9,adult   Overweight  - long discussion about weight loss, diet, and exercise -recommended diet heavy in fruits and veggies and low in animal meats, cheeses, and dairy products  ACE-inhibitor cough -     telmisartan (MICARDIS) 20 MG tablet; Take 1 tablet (20 mg total) by mouth daily. - STOP ACE   Discussed med's effects and SE's. Screening labs and tests as requested with regular follow-up as recommended.  HPI Patient presents for a complete physical.   He is on ACE and has consistent non productive cough, no SOB, wheezing. No more chest pain since he has stopped doing push ups.    His blood pressure has been controlled at home, today their BP is BP: 136/86 He does workout, walks. He denies chest pain, shortness of breath, dizziness.  He follows with Dr. Lenard Forth doing well, he had a CABG in 2008, on bASA, and lopressor.  He is on cholesterol medication, he is on zetia due to intolerance of statins due to myalgias. His cholesterol is not at goal of less than 70. The cholesterol last visit was:   Lab Results  Component Value Date   CHOL 180 01/08/2017   HDL 38 (L) 01/08/2017   LDLCALC 99 10/08/2016   TRIG 157 (H) 01/08/2017   CHOLHDL 4.7 01/08/2017   He has had diabetes since 2000.  He has been working on diet and exercise for diabetes with CAD, he is on bASA, MF 2000 mg total and amaryl 4mg  BID, will occ get low sugar, lowest is 70, and denies paresthesia of the feet, polydipsia and polyuria. He is NOT at goal less than 8. Last A1C in the office was:  Lab Results  Component Value Date   HGBA1C 9.2 (H) 01/08/2017  Lab Results  Component Value Date   GFRNONAA 79 01/08/2017   Patient is on Vitamin D supplement.   Lab Results  Component Value Date   VD25OH 28 (L) 01/08/2017     Follows with Dr. Berenice Primas for bilateral knee pain.   BMI is Body mass index is 27.13 kg/m., he is working on diet and exercise. Wt Readings from Last 3 Encounters:  04/10/17 143 lb 9.6 oz (65.1 kg)   02/25/17 141 lb 6.4 oz (64.1 kg)  02/05/17 144 lb (65.3 kg)    Current Medications:  Current Outpatient Medications on File Prior to Visit  Medication Sig Dispense Refill  . aspirin 81 MG tablet Take 81 mg by mouth daily.    . Cinnamon 500 MG capsule Take 2 capsules (1,000 mg total) by mouth 2 (two) times daily. 60 capsule 3  . diclofenac (VOLTAREN) 75 MG EC tablet Take 75 mg by mouth 2 (two) times daily as needed for mild pain.     [name] enalapril (VASOTEC) 10 MG tablet TAKE 1 TABLET BY MOUTH EVERY DAY (Patient taking differently: TAKE 10mg  BY MOUTH EVERY DAY) 30 tablet 11  . ezetimibe (ZETIA) 10 MG tablet Take 1 tablet daily for Cholesterol (Patient taking differently: Take 10 mg by mouth daily. ) 90 tablet 1  . glimepiride (AMARYL) 4 MG tablet TAKE 1 TABLET BY MOUTH TWICE A DAY 180 tablet 0  . metFORMIN (GLUCOPHAGE) 500 MG tablet TAKE 2 TABLETS BY MOUTH EVERY MORNING AND 2 EVERY EVENING 360 tablet 2  . metoprolol tartrate (LOPRESSOR) 25 MG tablet Take 1 tablet (25 mg total) by mouth 2 (two) times daily. NEED OV. 15 tablet 0  . Multiple Vitamin (MULTIVITAMIN) tablet Take 1 tablet by mouth daily.     No current facility-administered medications on file prior to visit.    Health Maintenance:  Immunization History  Administered Date(s) Administered  . DT 02/07/2015  . Influenza Whole 12/10/2012  . Influenza, High Dose Seasonal PF 02/07/2015, 01/02/2016, 12/13/2016  . Influenza-Unspecified 12/31/2013  . Pneumococcal Conjugate-13 02/02/2014  . Pneumococcal Polysaccharide-23 04/13/2003, 10/08/2016  . Td 04/18/2004   Tetanus: 2016 Pneumovax: 2005 Prevnar 13: 2015 Flu vaccine:2018 Zostavax: declines  DEXA: declines Colonoscopy: 2014 due 2024 EGD: N/A Dr. Clydene Laming eye doctor - getting appointment tomorrow, glacoma Echo 2009 MRI 2009 US carotid 01/2017  Patient Care Team: Unk Pinto, MD as PCP - General (Internal Medicine) Keene Breath., MD as Consulting Physician  (Ophthalmology) Minus Breeding, MD as Consulting Physician (Cardiology) Clarene Essex, MD as Consulting Physician (Gastroenterology) Berenice Primas, MD as Referring Physician (Orthopedic Surgery)  Medical History:  Past Medical History:  Diagnosis Date  . Anemia   . Carotid artery stenosis 01/2017   L-ICA 1-39%, R-ICA 40-59% doppler  . Coronary artery disease    s/p CABG in 2008, w/ LIMA-LAD, SVG-Diag, SVG-OM, SVG-PL  . Diabetes mellitus type II   . Hyperlipidemia   . Hypertension   . Vitamin D deficiency    Allergies Allergies  Allergen Reactions  . Statins Other (See Comments)    Elevated LFT's    SURGICAL HISTORY He  has a past surgical history that includes Coronary artery bypass graft (2008). FAMILY HISTORY His family history includes Diabetes in his sister; Heart attack in his father; Heart disease in his brother and father; Hyperlipidemia in his father; Hypertension in his father. SOCIAL HISTORY He  reports that he quit smoking about 21 years ago. he has never used smokeless tobacco. He reports that he does not  drink alcohol or use drugs.  Review of Systems  Constitutional: Negative.   HENT: Negative.   Respiratory: Negative.   Cardiovascular: Negative for chest pain, palpitations, orthopnea, claudication, leg swelling and PND.  Gastrointestinal: Negative.   Genitourinary: Negative.   Musculoskeletal: Negative for back pain, falls, joint pain, myalgias and neck pain.  Skin: Negative.   Neurological: Positive for tingling (right hand 4th and 5th digit off and on). Negative for dizziness, tremors, sensory change, speech change, focal weakness, seizures, loss of consciousness and headaches.  Psychiatric/Behavioral: Negative.      Physical Exam: Estimated body mass index is 27.13 kg/m as calculated from the following:   Height as of this encounter: 5\' 1"  (1.549 m).   Weight as of this encounter: 143 lb 9.6 oz (65.1 kg). BP 136/86   Pulse (!) 101   Temp 97.7 F  (36.5 C)   Resp 14   Ht 5\' 1"  (1.549 m)   Wt 143 lb 9.6 oz (65.1 kg)   SpO2 99%   BMI 27.13 kg/m  General Appearance: Well nourished, in no apparent distress.  Eyes: PERRLA, EOMs, conjunctiva no swelling or erythema, normal fundi and vessels.  Sinuses: No Frontal/maxillary tenderness  ENT/Mouth: Ext aud canals clear, normal light reflex with TMs without erythema, bulging. Good dentition. No erythema, swelling, or exudate on post pharynx. Tonsils not swollen or erythematous. Hearing normal.  Neck: Supple, thyroid normal. No bruits  Respiratory: Respiratory effort normal, BS equal bilaterally without rales, rhonchi, wheezing or stridor.  Cardio: RRR with systolic murmur RSB without rubs or gallops. Decreased hair bilateral legs, decrease TP/DP pulses, good bilateral sensation, without edema or ulcers.  Chest: symmetric, with normal excursions and percussion.  Abdomen: Soft, nontender, no guarding, rebound, hernias, masses, or organomegaly. .  Lymphatics: Non tender without lymphadenopathy.  Genitourinary: defer Musculoskeletal: Full ROM all peripheral extremities,5/5 strength, and normal gait,   Skin: Warm, dry without rashes, lesions, ecchymosis. Neuro: Cranial nerves intact, reflexes equal bilaterally. Normal muscle tone, no cerebellar symptoms. Sensation intact.  Psych: Awake and oriented X 3, normal affect, Insight and Judgment appropriate.   EKG: defer cardio AORTA SCAN: defer  [name] 9:12 AM [facility]

## 2017-04-10 ENCOUNTER — Ambulatory Visit (INDEPENDENT_AMBULATORY_CARE_PROVIDER_SITE_OTHER): Payer: Medicare Other | Admitting: Physician Assistant

## 2017-04-10 ENCOUNTER — Encounter: Payer: Self-pay | Admitting: Physician Assistant

## 2017-04-10 VITALS — BP 136/86 | HR 101 | Temp 97.7°F | Resp 14 | Ht 61.0 in | Wt 143.6 lb

## 2017-04-10 DIAGNOSIS — Z0001 Encounter for general adult medical examination with abnormal findings: Secondary | ICD-10-CM

## 2017-04-10 DIAGNOSIS — Z0000 Encounter for general adult medical examination without abnormal findings: Secondary | ICD-10-CM | POA: Diagnosis not present

## 2017-04-10 DIAGNOSIS — Z6827 Body mass index (BMI) 27.0-27.9, adult: Secondary | ICD-10-CM

## 2017-04-10 DIAGNOSIS — D649 Anemia, unspecified: Secondary | ICD-10-CM

## 2017-04-10 DIAGNOSIS — N181 Chronic kidney disease, stage 1: Secondary | ICD-10-CM

## 2017-04-10 DIAGNOSIS — Z79899 Other long term (current) drug therapy: Secondary | ICD-10-CM

## 2017-04-10 DIAGNOSIS — T464X5A Adverse effect of angiotensin-converting-enzyme inhibitors, initial encounter: Secondary | ICD-10-CM

## 2017-04-10 DIAGNOSIS — E1142 Type 2 diabetes mellitus with diabetic polyneuropathy: Secondary | ICD-10-CM | POA: Diagnosis not present

## 2017-04-10 DIAGNOSIS — R05 Cough: Secondary | ICD-10-CM

## 2017-04-10 DIAGNOSIS — E559 Vitamin D deficiency, unspecified: Secondary | ICD-10-CM

## 2017-04-10 DIAGNOSIS — I10 Essential (primary) hypertension: Secondary | ICD-10-CM | POA: Diagnosis not present

## 2017-04-10 DIAGNOSIS — E1122 Type 2 diabetes mellitus with diabetic chronic kidney disease: Secondary | ICD-10-CM | POA: Diagnosis not present

## 2017-04-10 DIAGNOSIS — I6529 Occlusion and stenosis of unspecified carotid artery: Secondary | ICD-10-CM

## 2017-04-10 DIAGNOSIS — E782 Mixed hyperlipidemia: Secondary | ICD-10-CM

## 2017-04-10 DIAGNOSIS — I2510 Atherosclerotic heart disease of native coronary artery without angina pectoris: Secondary | ICD-10-CM

## 2017-04-10 MED ORDER — COLESEVELAM HCL 625 MG PO TABS: 625.0000 mg | ORAL_TABLET | Freq: Two times a day (BID) | ORAL | 3 refills | Status: DC

## 2017-04-10 MED ORDER — TELMISARTAN 20 MG PO TABS: 20.0000 mg | ORAL_TABLET | Freq: Every day | ORAL | 0 refills | Status: DC

## 2017-04-10 NOTE — Patient Instructions (Addendum)
STOP THE ENALAPRIL, THIS CAN CAUSE A COUGH START ON MICARDIS 20MG  DAILY AND MONITOR BP GOAL IS 120/70, IF IT IS HIGHER THAN THIS CALL AND WE WILL INCREASE THE NEW MEDICATION TO 40 MG  START ON THE WELCHOL TWICE DAILY CONTINUE THE CINNAMON  Are you an emotional eater? Do you eat more when you're feeling stressed? Do you eat when you're not hungry or when you're full? Do you eat to feel better (to calm and soothe yourself when you're sad, mad, bored, anxious, etc.)? Do you reward yourself with food? Do you regularly eat until you've stuffed yourself? Does food make you feel safe? Do you feel like food is a friend? Do you feel powerless or out of control around food?  If you answered yes to some of these questions than it is likely that you are an emotional eater. This is normally a learned behavior and can take time to first recognize the signs and second BREAK THE HABIT. But here is more information and tips to help.   The difference between emotional hunger and physical hunger Emotional hunger can be powerful, so it's easy to mistake it for physical hunger. But there are clues you can look for to help you tell physical and emotional hunger apart.  Emotional hunger comes on suddenly. It hits you in an instant and feels overwhelming and urgent. Physical hunger, on the other hand, comes on more gradually. The urge to eat doesn't feel as dire or demand instant satisfaction (unless you haven't eaten for a very long time).  Emotional hunger craves specific comfort foods. When you're physically hungry, almost anything sounds good-including healthy stuff like vegetables. But emotional hunger craves junk food or sugary snacks that provide an instant rush. You feel like you need cheesecake or pizza, and nothing else will do.  Emotional hunger often leads to mindless eating. Before you know it, you've eaten a whole bag of chips or an entire pint of ice cream without really paying attention or fully  enjoying it. When you're eating in response to physical hunger, you're typically more aware of what you're doing.  Emotional hunger isn't satisfied once you're full. You keep wanting more and more, often eating until you're uncomfortably stuffed. Physical hunger, on the other hand, doesn't need to be stuffed. You feel satisfied when your stomach is full.  Emotional hunger isn't located in the stomach. Rather than a growling belly or a pang in your stomach, you feel your hunger as a craving you can't get out of your head. You're focused on specific textures, tastes, and smells.  Emotional hunger often leads to regret, guilt, or shame. When you eat to satisfy physical hunger, you're unlikely to feel guilty or ashamed because you're simply giving your body what it needs. If you feel guilty after you eat, it's likely because you know deep down that you're not eating for nutritional reasons.  Identify your emotional eating triggers What situations, places, or feelings make you reach for the comfort of food? Most emotional eating is linked to unpleasant feelings, but it can also be triggered by positive emotions, such as rewarding yourself for achieving a goal or celebrating a holiday or happy event. Common causes of emotional eating include:  Stuffing emotions - Eating can be a way to temporarily silence or "stuff down" uncomfortable emotions, including anger, fear, sadness, anxiety, loneliness, resentment, and shame. While you're numbing yourself with food, you can avoid the difficult emotions you'd rather not feel.  Boredom or feelings of  emptiness - Do you ever eat simply to give yourself something to do, to relieve boredom, or as a way to fill a void in your life? You feel unfulfilled and empty, and food is a way to occupy your mouth and your time. In the moment, it fills you up and distracts you from underlying feelings of purposelessness and dissatisfaction with your life.  Childhood habits - Think  back to your childhood memories of food. Did your parents reward good behavior with ice cream, take you out for pizza when you got a good report card, or serve you sweets when you were feeling sad? These habits can often carry over into adulthood. Or your eating may be driven by nostalgia-for cherished memories of grilling burgers in the backyard with your dad or baking and eating cookies with your mom.  Social influences - Getting together with other people for a meal is a great way to relieve stress, but it can also lead to overeating. It's easy to overindulge simply because the food is there or because everyone else is eating. You may also overeat in social situations out of nervousness. Or perhaps your family or circle of friends encourages you to overeat, and it's easier to go along with the group.  Stress - Ever notice how stress makes you hungry? It's not just in your mind. When stress is chronic, as it so often is in our chaotic, fast-paced world, your body produces high levels of the stress hormone, cortisol. Cortisol triggers cravings for salty, sweet, and fried foods-foods that give you a burst of energy and pleasure. The more uncontrolled stress in your life, the more likely you are to turn to food for emotional relief.  Find other ways to feed your feelings If you don't know how to manage your emotions in a way that doesn't involve food, you won't be able to control your eating habits for very long. Diets so often fail because they offer logical nutritional advice which only works if you have conscious control over your eating habits. It doesn't work when emotions hijack the process, demanding an immediate payoff with food.  In order to stop emotional eating, you have to find other ways to fulfill yourself emotionally. It's not enough to understand the cycle of emotional eating or even to understand your triggers, although that's a huge first step. You need alternatives to food that you can  turn to for emotional fulfillment.  Alternatives to emotional eating If you're depressed or lonely, call someone who always makes you feel better, play with your dog or cat, or look at a favorite photo or cherished memento.  If you're anxious, expend your nervous energy by dancing to your favorite song, squeezing a stress ball, or taking a brisk walk.  If you're exhausted, treat yourself with a hot cup of tea, take a bath, light some scented candles, or wrap yourself in a warm blanket.  If you're bored, read a good book, watch a comedy show, explore the outdoors, or turn to an activity you enjoy (woodworking, playing the guitar, shooting hoops, scrapbooking, etc.).  What is mindful eating? Mindful eating is a practice that develops your awareness of eating habits and allows you to pause between your triggers and your actions. Most emotional eaters feel powerless over their food cravings. When the urge to eat hits, you feel an almost unbearable tension that demands to be fed, right now. Because you've tried to resist in the past and failed, you believe that your willpower  just isn't up to snuff. But the truth is that you have more power over your cravings than you think.  Take 5 before you give in to a craving Emotional eating tends to be automatic and virtually mindless. Before you even realize what you're doing, you've reached for a tub of ice cream and polished off half of it. But if you can take a moment to pause and reflect when you're hit with a craving, you give yourself the opportunity to make a different decision.  Can you put off eating for five minutes? Or just start with one minute. Don't tell yourself you can't give in to the craving; remember, the forbidden is extremely tempting. Just tell yourself to wait.  While you're waiting, check in with yourself. How are you feeling? What's going on emotionally? Even if you end up eating, you'll have a better understanding of why you did it.  This can help you set yourself up for a different response next time.  How to practice mindful eating Eating while you're also doing other things-such as watching TV, driving, or playing with your phone-can prevent you from fully enjoying your food. Since your mind is elsewhere, you may not feel satisfied or continue eating even though you're no longer hungry. Eating more mindfully can help focus your mind on your food and the pleasure of a meal and curb overeating.   Eat your meals in a calm place with no distractions, aside from any dining companions.  Try eating with your non-dominant hand or using chopsticks instead of a knife and fork. Eating in such a non-familiar way can slow down how fast you eat and ensure your mind stays focused on your food.  Allow yourself enough time not to have to rush your meal. Set a timer for 20 minutes and pace yourself so you spend at least that much time eating.  Take small bites and chew them well, taking time to notice the different flavors and textures of each mouthful.  Put your utensils down between bites. Take time to consider how you feel-hungry, satiated-before picking up your utensils again.  Try to stop eating before you are full.It takes time for the signal to reach your brain that you've had enough. Don't feel obligated to always clean your plate.  When you've finished your food, take a few moments to assess if you're really still hungry before opting for an extra serving or dessert.  Learn to accept your feelings-even the bad ones  While it may seem that the core problem is that you're powerless over food, emotional eating actually stems from feeling powerless over your emotions. You don't feel capable of dealing with your feelings head on, so you avoid them with food.  Recommended reading  Mini Habits for weight loss- I REALLY LIKE THIS  Healthy Eating: A guide to the new nutrition - [place] Report  10 Tips  for Mindful Eating - How mindfulness can help you fully enjoy a meal and the experience of eating-with moderation and restraint. ([place])  Weight Loss: Gain Control of Emotional Eating - Tips to regain control of your eating habits. [facility])  Why Stress Causes People to Overeat -Tips on controlling stress eating. ([place])  Mindful Eating Meditations -Free online mindfulness meditations. ([facility] [employer])    Veggies are great because you can eat a ton! They are low in calories, great to fill you up, and have a ton of vitamins,  minerals, and protein.

## 2017-04-11 LAB — MICROALBUMIN / CREATININE URINE RATIO
Creatinine, Urine: 83 mg/dL (ref 20–320)
MICROALB/CREAT RATIO: 7 ug/mg{creat} (ref <30)
Microalb, Ur: 0.6 mg/dL

## 2017-04-11 LAB — TSH: TSH: 2.99 mIU/L (ref 0.40–4.50)

## 2017-04-11 LAB — CBC WITH DIFFERENTIAL/PLATELET
Basophils Absolute: 67 cells/uL (ref 0–200)
Basophils Relative: 0.8 %
Eosinophils Absolute: 546 cells/uL — ABNORMAL HIGH (ref 15–500)
Eosinophils Relative: 6.5 %
HEMATOCRIT: 36.3 % — AB (ref 38.5–50.0)
Hemoglobin: 10.9 g/dL — ABNORMAL LOW (ref 13.2–17.1)
LYMPHS ABS: 1520 {cells}/uL (ref 850–3900)
MCH: 20.2 pg — ABNORMAL LOW (ref 27.0–33.0)
MCHC: 30 g/dL — ABNORMAL LOW (ref 32.0–36.0)
MCV: 67.2 fL — AB (ref 80.0–100.0)
MPV: 11.5 fL (ref 7.5–12.5)
Monocytes Relative: 9.2 %
NEUTROS PCT: 65.4 %
Neutro Abs: 5494 cells/uL (ref 1500–7800)
Platelets: 325 10*3/uL (ref 140–400)
RBC: 5.4 10*6/uL (ref 4.20–5.80)
RDW: 16.5 % — AB (ref 11.0–15.0)
Total Lymphocyte: 18.1 %
WBC: 8.4 10*3/uL (ref 3.8–10.8)
WBCMIX: 773 {cells}/uL (ref 200–950)

## 2017-04-11 LAB — LIPID PANEL
CHOL/HDL RATIO: 4.3 (calc) (ref <5.0)
Cholesterol: 171 mg/dL (ref <200)
HDL: 40 mg/dL — ABNORMAL LOW (ref >40)
LDL CHOLESTEROL (CALC): 111 mg/dL — AB
NON-HDL CHOLESTEROL (CALC): 131 mg/dL — AB (ref <130)
TRIGLYCERIDES: 95 mg/dL (ref <150)

## 2017-04-11 LAB — URINALYSIS, ROUTINE W REFLEX MICROSCOPIC
BILIRUBIN URINE: NEGATIVE
GLUCOSE, UA: NEGATIVE
Hgb urine dipstick: NEGATIVE
Ketones, ur: NEGATIVE
LEUKOCYTES UA: NEGATIVE
Nitrite: NEGATIVE
PROTEIN: NEGATIVE
SPECIFIC GRAVITY, URINE: 1.015 (ref 1.001–1.03)

## 2017-04-11 LAB — BASIC METABOLIC PANEL WITH GFR
BUN: 11 mg/dL (ref 7–25)
CO2: 27 mmol/L (ref 20–32)
CREATININE: 0.99 mg/dL (ref 0.70–1.25)
Calcium: 9.2 mg/dL (ref 8.6–10.3)
Chloride: 101 mmol/L (ref 98–110)
GFR, EST AFRICAN AMERICAN: 90 mL/min/{1.73_m2} (ref >=60)
GFR, EST NON AFRICAN AMERICAN: 78 mL/min/{1.73_m2} (ref >=60)
Glucose, Bld: 140 mg/dL — ABNORMAL HIGH (ref 65–99)
POTASSIUM: 5.7 mmol/L — AB (ref 3.5–5.3)
Sodium: 135 mmol/L (ref 135–146)

## 2017-04-11 LAB — HEPATIC FUNCTION PANEL
AG RATIO: 1.5 (calc) (ref 1.0–2.5)
ALBUMIN MSPROF: 3.9 g/dL (ref 3.6–5.1)
ALT: 15 U/L (ref 9–46)
AST: 12 U/L (ref 10–35)
Alkaline phosphatase (APISO): 65 U/L (ref 40–115)
BILIRUBIN DIRECT: 0.1 mg/dL (ref 0.0–0.2)
BILIRUBIN TOTAL: 0.4 mg/dL (ref 0.2–1.2)
GLOBULIN: 2.6 g/dL (ref 1.9–3.7)
Indirect Bilirubin: 0.3 mg/dL (calc) (ref 0.2–1.2)
Total Protein: 6.5 g/dL (ref 6.1–8.1)

## 2017-04-11 LAB — HEMOGLOBIN A1C
Hgb A1c MFr Bld: 7.7 % of total Hgb — ABNORMAL HIGH (ref <5.7)
Mean Plasma Glucose: 174 (calc)
eAG (mmol/L): 9.7 (calc)

## 2017-04-11 LAB — VITAMIN D 25 HYDROXY (VIT D DEFICIENCY, FRACTURES): Vit D, 25-Hydroxy: 29 ng/mL — ABNORMAL LOW (ref 30–100)

## 2017-04-11 LAB — MAGNESIUM: Magnesium: 1.5 mg/dL (ref 1.5–2.5)

## 2017-04-11 LAB — CBC MORPHOLOGY

## 2017-04-14 DIAGNOSIS — H52223 Regular astigmatism, bilateral: Secondary | ICD-10-CM | POA: Diagnosis not present

## 2017-04-14 DIAGNOSIS — H40013 Open angle with borderline findings, low risk, bilateral: Secondary | ICD-10-CM | POA: Diagnosis not present

## 2017-04-14 DIAGNOSIS — H524 Presbyopia: Secondary | ICD-10-CM | POA: Diagnosis not present

## 2017-04-14 DIAGNOSIS — E113213 Type 2 diabetes mellitus with mild nonproliferative diabetic retinopathy with macular edema, bilateral: Secondary | ICD-10-CM | POA: Diagnosis not present

## 2017-04-14 DIAGNOSIS — H25813 Combined forms of age-related cataract, bilateral: Secondary | ICD-10-CM | POA: Diagnosis not present

## 2017-04-14 LAB — HM DIABETES EYE EXAM

## 2017-04-23 ENCOUNTER — Encounter: Payer: Self-pay | Admitting: *Deleted

## 2017-06-12 DIAGNOSIS — M1711 Unilateral primary osteoarthritis, right knee: Secondary | ICD-10-CM | POA: Diagnosis not present

## 2017-06-12 DIAGNOSIS — M1712 Unilateral primary osteoarthritis, left knee: Secondary | ICD-10-CM | POA: Diagnosis not present

## 2017-06-23 ENCOUNTER — Other Ambulatory Visit: Payer: Self-pay | Admitting: Adult Health

## 2017-07-05 ENCOUNTER — Other Ambulatory Visit: Payer: Self-pay | Admitting: Internal Medicine

## 2017-07-05 DIAGNOSIS — E782 Mixed hyperlipidemia: Secondary | ICD-10-CM

## 2017-07-23 NOTE — Patient Instructions (Signed)

## 2017-07-23 NOTE — Progress Notes (Signed)
This very nice 67 y.o. Married Arabic Man presents for 3 month follow up with HTN, HLD, Pre-Diabetes and Vitamin D Deficiency.      Patient is treated for HTN & BP has been controlled at home. Today's BP is at goal -  138/76. Patient underwent CABG in 2008 and is also followed by Dr Percival Spanish. Patient has had no complaints of any cardiac type chest pain, palpitations, dyspnea / orthopnea / PND, dizziness, claudication, or dependent edema.     Hyperlipidemia is not controlled with diet & alleged Statin Intolerant with ? elevated LFT's. Patient. Last Lipids were not at goal: Lab Results  Component Value Date   CHOL 171 04/10/2017   HDL 40 (L) 04/10/2017   LDLCALC 111 (H) 04/10/2017   TRIG 95 04/10/2017   CHOLHDL 4.3 04/10/2017      Also, the patient has history of T2_NIDDM (2000) and has had no symptoms of reactive hypoglycemia, diabetic polys, paresthesias or visual blurring. He does not  (and will not) monitor CBG's Last A1c was not at goal: Lab Results  Component Value Date   HGBA1C 7.7 (H) 04/10/2017      Further, the patient also has history of Vitamin D Deficiency ("37"/2017) and supplements vitamin D without any suspected side-effects. Last vitamin D was still very low:  Lab Results  Component Value Date   VD25OH 56 (L) 04/10/2017   Current Outpatient Medications on File Prior to Visit  Medication Sig  . aspirin 81 MG tablet Take 81 mg by mouth daily.  . Cinnamon 500 MG capsule Take 2 capsules (1,000 mg total) by mouth 2 (two) times daily.  . colesevelam (WELCHOL) 625 MG tablet Take 1 tablet (625 mg total) by mouth 2 (two) times daily with a meal.  . diclofenac (VOLTAREN) 75 MG EC tablet Take 75 mg by mouth 2 (two) times daily as needed for mild pain.   [name] ezetimibe (ZETIA) 10 MG tablet TAKE 1 TABLET BY MOUTH DAILY FOR CHOLESTEROL  . glimepiride (AMARYL) 4 MG tablet TAKE 1 TABLET BY MOUTH TWICE A DAY  . metFORMIN (GLUCOPHAGE) 500 MG tablet TAKE 2 TABLETS BY MOUTH EVERY  MORNING AND 2 EVERY EVENING  . metoprolol tartrate (LOPRESSOR) 25 MG tablet Take 1 tablet (25 mg total) by mouth 2 (two) times daily. NEED OV.  . Multiple Vitamin (MULTIVITAMIN) tablet Take 1 tablet by mouth daily.  [name] telmisartan (MICARDIS) 20 MG tablet Take 1 tablet (20 mg total) by mouth daily.   No current facility-administered medications on file prior to visit.    Allergies  Allergen Reactions  . Ace Inhibitors Cough  . Statins Other (See Comments)    Elevated LFT's   PMHx:   Past Medical History:  Diagnosis Date  . Anemia   . Carotid artery stenosis 01/2017   L-ICA 1-39%, R-ICA 40-59% doppler  . Coronary artery disease    s/p CABG in 2008, w/ LIMA-LAD, SVG-Diag, SVG-OM, SVG-PL  . Diabetes mellitus type II   . Hyperlipidemia   . Hypertension   . Vitamin D deficiency    Immunization History  Administered Date(s) Administered  . DT 02/07/2015  . Influenza Whole 12/10/2012  . Influenza, High Dose Seasonal PF 02/07/2015, 01/02/2016, 12/13/2016  . Influenza-Unspecified 12/31/2013  . Pneumococcal Conjugate-13 02/02/2014  . Pneumococcal Polysaccharide-23 04/13/2003, 10/08/2016  . Td 04/18/2004   Past Surgical History:  Procedure Laterality Date  . CORONARY ARTERY BYPASS GRAFT  2008   LIMA to the LAD, saphenous vein graft  to diagonal, saphenous vein graft to OM, saphenous vein graft to posterolateral 2008   FHx:    Reviewed / unchanged  SHx:    Reviewed / unchanged  Systems Review:  Constitutional: Denies fever, chills, wt changes, headaches, insomnia, fatigue, night sweats, change in appetite. Eyes: Denies redness, blurred vision, diplopia, discharge, itchy, watery eyes.  ENT: Denies discharge, congestion, post nasal drip, epistaxis, sore throat, earache, hearing loss, dental pain, tinnitus, vertigo, sinus pain, snoring.  CV: Denies chest pain, palpitations, irregular heartbeat, syncope, dyspnea, diaphoresis, orthopnea, PND, claudication or edema. Respiratory: denies  cough, dyspnea, DOE, pleurisy, hoarseness, laryngitis, wheezing.  Gastrointestinal: Denies dysphagia, odynophagia, heartburn, reflux, water brash, abdominal pain or cramps, nausea, vomiting, bloating, diarrhea, constipation, hematemesis, melena, hematochezia  or hemorrhoids. Genitourinary: Denies dysuria, frequency, urgency, nocturia, hesitancy, discharge, hematuria or flank pain. Musculoskeletal: Denies arthralgias, myalgias, stiffness, jt. swelling, pain, limping or strain/sprain.  Skin: Denies pruritus, rash, hives, warts, acne, eczema or change in skin lesion(s). Neuro: No weakness, tremor, incoordination, spasms, paresthesia or pain. Psychiatric: Denies confusion, memory loss or sensory loss. Endo: Denies change in weight, skin or hair change.  Heme/Lymph: No excessive bleeding, bruising or enlarged lymph nodes.  Physical Exam  BP 138/76   Pulse 83   Temp (!) 97.3 F (36.3 C)   Resp 12   Ht 5\' 1"  (1.549 m)   Wt 143 lb 3.2 oz (65 kg)   SpO2 95%   BMI 27.06 kg/m   Appears  well nourished, well groomed  and in no distress.  Eyes: PERRLA, EOMs, conjunctiva no swelling or erythema. Sinuses: No frontal/maxillary tenderness ENT/Mouth: EAC's clear, TM's nl w/o erythema, bulging. Nares clear w/o erythema, swelling, exudates. Oropharynx clear without erythema or exudates. Oral hygiene is good. Tongue normal, non obstructing. Hearing intact.  Neck: Supple. Thyroid not palpable. Car 2+/2+ without bruits, nodes or JVD. Chest: Respirations nl with BS clear & equal w/o rales, rhonchi, wheezing or stridor.  Cor: Heart sounds normal w/ regular rate and rhythm without sig. murmurs, gallops, clicks or rubs. Peripheral pulses normal and equal  without edema.  Abdomen: Soft & bowel sounds normal. Non-tender w/o guarding, rebound, hernias, masses or organomegaly.  Lymphatics: Unremarkable.  Musculoskeletal: Full ROM all peripheral extremities, joint stability, 5/5 strength and normal gait.  Skin:  Warm, dry without exposed rashes, lesions or ecchymosis apparent.  Neuro: Cranial nerves intact, reflexes equal bilaterally. Sensory-motor testing grossly intact. Sensation intact to touch, vibratory and Monofilament to the toes bilaterally. Tendon reflexes grossly intact.  Pysch: Alert & oriented x 3.  Insight and judgement nl & appropriate. No ideations.  Assessment and Plan:  1. Essential hypertension  - Continue medication, monitor blood pressure at home.  - Continue DASH diet.  Reminder to go to the ER if any CP,  SOB, nausea, dizziness, severe HA, changes vision/speech.  - CBC with Differential/Platelet - COMPLETE METABOLIC PANEL WITH GFR - Lipid panel - TSH  2. Hyperlipidemia, mixed  - Continue diet/meds, exercise,& lifestyle modifications.  - Continue monitor periodic cholesterol/liver & renal functions   - Magnesium - TSH  3. Type 2 diabetes mellitus with stage 1 chronic kidney disease, without long-term current use of insulin (HCC)  - Continue diet, exercise, lifestyle modifications.  - Monitor appropriate labs.  - Hemoglobin A1c - Insulin, random  4. Vitamin D deficiency  - Continue supplementation.  - VITAMIN D 25 Hydroxyl  5. Atherosclerosis of native coronary artery of native heart without angina pectoris  - Lipid panel  6. Costochondritis  -  predniSONE (DELTASONE) 20 MG tablet; 1 tab 3 x day for 3 days, then 1 tab 2 x day for 3 days, then 1 tab 1 x day for 5 days  Dispense: 20 tablet; Refill: 0  7. Medication management  - CBC with Differential/Platelet - COMPLETE METABOLIC PANEL WITH GFR - Magnesium - Lipid panel - TSH - Hemoglobin A1c - Insulin, random - VITAMIN D 25 Hydroxyl           Discussed  regular exercise, BP monitoring, weight control to achieve/maintain BMI less than 25 and discussed med and SE's. Recommended labs to assess and monitor clinical status with further disposition pending results of labs. Over 30 minutes of exam,  counseling, chart review was performed.

## 2017-07-24 ENCOUNTER — Ambulatory Visit (INDEPENDENT_AMBULATORY_CARE_PROVIDER_SITE_OTHER): Payer: Medicare Other | Admitting: Internal Medicine

## 2017-07-24 ENCOUNTER — Encounter: Payer: Self-pay | Admitting: Internal Medicine

## 2017-07-24 VITALS — BP 138/76 | HR 83 | Temp 97.3°F | Resp 12 | Ht 61.0 in | Wt 143.2 lb

## 2017-07-24 DIAGNOSIS — N181 Chronic kidney disease, stage 1: Secondary | ICD-10-CM

## 2017-07-24 DIAGNOSIS — I10 Essential (primary) hypertension: Secondary | ICD-10-CM

## 2017-07-24 DIAGNOSIS — E559 Vitamin D deficiency, unspecified: Secondary | ICD-10-CM

## 2017-07-24 DIAGNOSIS — E1122 Type 2 diabetes mellitus with diabetic chronic kidney disease: Secondary | ICD-10-CM

## 2017-07-24 DIAGNOSIS — I2510 Atherosclerotic heart disease of native coronary artery without angina pectoris: Secondary | ICD-10-CM

## 2017-07-24 DIAGNOSIS — M940 Chondrocostal junction syndrome [Tietze]: Secondary | ICD-10-CM

## 2017-07-24 DIAGNOSIS — Z79899 Other long term (current) drug therapy: Secondary | ICD-10-CM

## 2017-07-24 DIAGNOSIS — E782 Mixed hyperlipidemia: Secondary | ICD-10-CM

## 2017-07-24 MED ORDER — PREDNISONE 20 MG PO TABS: ORAL_TABLET | ORAL | 0 refills | Status: DC

## 2017-07-25 LAB — CBC WITH DIFFERENTIAL/PLATELET
BASOS ABS: 68 {cells}/uL (ref 0–200)
Basophils Relative: 1.1 %
EOS ABS: 422 {cells}/uL (ref 15–500)
Eosinophils Relative: 6.8 %
HEMATOCRIT: 37.6 % — AB (ref 38.5–50.0)
Hemoglobin: 11.6 g/dL — ABNORMAL LOW (ref 13.2–17.1)
LYMPHS ABS: 1810 {cells}/uL (ref 850–3900)
MCH: 20.1 pg — AB (ref 27.0–33.0)
MCHC: 30.9 g/dL — ABNORMAL LOW (ref 32.0–36.0)
MCV: 65.2 fL — AB (ref 80.0–100.0)
Monocytes Relative: 9.9 %
NEUTROS PCT: 53 %
Neutro Abs: 3286 cells/uL (ref 1500–7800)
Platelets: 338 10*3/uL (ref 140–400)
RBC: 5.77 10*6/uL (ref 4.20–5.80)
RDW: 17.6 % — AB (ref 11.0–15.0)
Total Lymphocyte: 29.2 %
WBC: 6.2 10*3/uL (ref 3.8–10.8)
WBCMIX: 614 {cells}/uL (ref 200–950)

## 2017-07-25 LAB — TSH: TSH: 2.52 mIU/L (ref 0.40–4.50)

## 2017-07-25 LAB — VITAMIN D 25 HYDROXY (VIT D DEFICIENCY, FRACTURES): VIT D 25 HYDROXY: 51 ng/mL (ref 30–100)

## 2017-07-25 LAB — LIPID PANEL
CHOL/HDL RATIO: 5.5 (calc) — AB (ref <5.0)
CHOLESTEROL: 221 mg/dL — AB (ref <200)
HDL: 40 mg/dL — AB (ref >40)
LDL CHOLESTEROL (CALC): 154 mg/dL — AB
Non-HDL Cholesterol (Calc): 181 mg/dL (calc) — ABNORMAL HIGH (ref <130)
Triglycerides: 138 mg/dL (ref <150)

## 2017-07-25 LAB — COMPLETE METABOLIC PANEL WITH GFR
AG RATIO: 1.6 (calc) (ref 1.0–2.5)
ALT: 15 U/L (ref 9–46)
AST: 12 U/L (ref 10–35)
Albumin: 4.2 g/dL (ref 3.6–5.1)
Alkaline phosphatase (APISO): 67 U/L (ref 40–115)
BILIRUBIN TOTAL: 0.5 mg/dL (ref 0.2–1.2)
BUN: 16 mg/dL (ref 7–25)
CO2: 30 mmol/L (ref 20–32)
CREATININE: 1.06 mg/dL (ref 0.70–1.25)
Calcium: 9.9 mg/dL (ref 8.6–10.3)
Chloride: 99 mmol/L (ref 98–110)
GFR, EST NON AFRICAN AMERICAN: 71 mL/min/{1.73_m2} (ref >=60)
GFR, Est African American: 83 mL/min/{1.73_m2} (ref >=60)
GLOBULIN: 2.6 g/dL (ref 1.9–3.7)
Glucose, Bld: 217 mg/dL — ABNORMAL HIGH (ref 65–99)
POTASSIUM: 5.2 mmol/L (ref 3.5–5.3)
Sodium: 133 mmol/L — ABNORMAL LOW (ref 135–146)
TOTAL PROTEIN: 6.8 g/dL (ref 6.1–8.1)

## 2017-07-25 LAB — HEMOGLOBIN A1C
Hgb A1c MFr Bld: 9.6 % of total Hgb — ABNORMAL HIGH (ref <5.7)
MEAN PLASMA GLUCOSE: 229 (calc)
eAG (mmol/L): 12.7 (calc)

## 2017-07-25 LAB — MAGNESIUM: Magnesium: 1.7 mg/dL (ref 1.5–2.5)

## 2017-07-25 LAB — INSULIN, RANDOM: INSULIN: 5.2 u[IU]/mL (ref 2.0–19.6)

## 2017-07-26 ENCOUNTER — Encounter: Payer: Self-pay | Admitting: Internal Medicine

## 2017-07-28 ENCOUNTER — Other Ambulatory Visit: Payer: Self-pay

## 2017-07-28 MED ORDER — ROSUVASTATIN CALCIUM 20 MG PO TABS: ORAL_TABLET | ORAL | 5 refills | Status: DC

## 2017-08-02 ENCOUNTER — Other Ambulatory Visit: Payer: Self-pay | Admitting: Physician Assistant

## 2017-08-02 DIAGNOSIS — T464X5A Adverse effect of angiotensin-converting-enzyme inhibitors, initial encounter: Secondary | ICD-10-CM

## 2017-08-02 DIAGNOSIS — I10 Essential (primary) hypertension: Secondary | ICD-10-CM

## 2017-08-02 DIAGNOSIS — R05 Cough: Secondary | ICD-10-CM

## 2017-08-02 DIAGNOSIS — R058 Other specified cough: Secondary | ICD-10-CM

## 2017-08-10 DIAGNOSIS — E663 Overweight: Secondary | ICD-10-CM | POA: Insufficient documentation

## 2017-08-10 DIAGNOSIS — E11319 Type 2 diabetes mellitus with unspecified diabetic retinopathy without macular edema: Secondary | ICD-10-CM | POA: Insufficient documentation

## 2017-08-10 NOTE — Progress Notes (Signed)
Diabetes Education and Follow-Up Visit  67 y.o.male presents for diabetic education. He has Diabetes Mellitus type 2:  with diabetic chronic kidney disease and with diabetic peripheral angiopathy without gangrene, he is on bASA, and denies foot ulcerations, increased appetite, nausea, paresthesia of the feet, polydipsia, polyuria, visual disturbances, vomiting and weight loss.  Last hemoglobin A1c was: Lab Results  Component Value Date   HGBA1C 9.6 (H) 07/24/2017   HGBA1C 7.7 (H) 04/10/2017   HGBA1C 9.2 (H) 01/08/2017  He admits recent increase in A1C can be attributed to non-adherence to diet for the last 4-5 months, but reports since last visit has been "cutting out the junk."  Body mass index is 26.64 kg/m.  Pt is on a regimen of: glimepiride (Amaryl), Glucophage XR  He also takes cinnamon 1000 mg BID  Pt checks his sugars 2-3 times a week: fasting values ranging 110-130, reports he checks randomly if feels like sugars have been high or low - hasn't had any episodes this past month.  Lowest sugar was: 110  He has hypoglycemia awareness at 60-70 (blurry vision, hand shaking) Highest sugar was: 130 Glucometer: OneTouch  Exercise: Walks some, does pushups 50-60 daily, stretching, strength training - does at least 30 min daily  Patient does have CKD He is on ACE/ARB  Lab Results  Component Value Date   GFRAA 83 07/24/2017   Lab Results  Component Value Date   MICROALBUR 0.6 04/10/2017    He is on a Statin.  He is not at goal of less than 70.  Lab Results  Component Value Date   CHOL 221 (H) 07/24/2017   HDL 40 (L) 07/24/2017   LDLCALC 154 (H) 07/24/2017   TRIG 138 07/24/2017   CHOLHDL 5.5 (H) 07/24/2017    Problem List has Hyperlipidemia; Anemia; Coronary atherosclerosis; Carotid stenosis; Essential hypertension; Vitamin D deficiency; Medication management; Encounter for Medicare annual wellness exam; Neuropathy, diabetic ([place]); DM type 2 (diabetes mellitus, type 2)  ([place]); Poor compliance; Overweight (BMI 25.0-29.9); and Diabetic retinopathy of both eyes ([place]) on their problem list.  Medications Current Outpatient Medications on File Prior to Visit  Medication Sig  . aspirin 81 MG tablet Take 81 mg by mouth daily.  . Cinnamon 500 MG capsule Take 2 capsules (1,000 mg total) by mouth 2 (two) times daily.  [name] ezetimibe (ZETIA) 10 MG tablet TAKE 1 TABLET BY MOUTH DAILY FOR CHOLESTEROL  . glimepiride (AMARYL) 4 MG tablet TAKE 1 TABLET BY MOUTH TWICE A DAY  . metFORMIN (GLUCOPHAGE) 500 MG tablet TAKE 2 TABLETS BY MOUTH EVERY MORNING AND 2 EVERY EVENING  . metoprolol tartrate (LOPRESSOR) 25 MG tablet Take 1 tablet (25 mg total) by mouth 2 (two) times daily. NEED OV.  . Multiple Vitamin (MULTIVITAMIN) tablet Take 1 tablet by mouth daily.  . rosuvastatin (CRESTOR) 20 MG tablet Take 1 tablet by mouth daily.  . Vitamin D, Ergocalciferol, (DRISDOL) 50000 units CAPS capsule Take 50,000 Units by mouth every 7 (seven) days.  . diclofenac (VOLTAREN) 75 MG EC tablet Take 75 mg by mouth 2 (two) times daily as needed for mild pain.   . predniSONE (DELTASONE) 20 MG tablet 1 tab 3 x day for 3 days, then 1 tab 2 x day for 3 days, then 1 tab 1 x day for 5 days  . telmisartan (MICARDIS) 20 MG tablet TAKE 1 TABLET BY MOUTH EVERY DAY (Patient not taking: Reported on 08/11/2017)   No current facility-administered medications on file prior to visit.  ROS- see HPI  Physical Exam: Blood pressure (!) 146/90, pulse 84, temperature (!) 97.5 F (36.4 C), height 5\' 1"  (1.549 m), weight 141 lb (64 kg), SpO2 97 %. Body mass index is 26.64 kg/m. General Appearance: Well nourished, in no apparent distress. Eyes: PERRLA, EOMs, conjunctiva no swelling or erythema ENT/Mouth: Ext aud canals clear, TMs without erythema, bulging. No erythema, swelling, or exudate on post pharynx.  Tonsils not swollen or erythematous. Hearing normal.  Respiratory: Respiratory effort normal, BS equal  bilaterally without rales, rhonchi, wheezing or stridor.  Cardio: RRR with no MRGs. Brisk peripheral pulses without edema.  Abdomen: Soft, + BS.  Non tender, no guarding, rebound, hernias, masses. Musculoskeletal: Full ROM, 5/5 strength, normal gait.  Skin: Warm, dry without rashes, lesions, ecchymosis.  Neuro: Cranial nerves intact. Normal muscle tone, no cerebellar symptoms. Sensation intact.    Plan and Assessment: Diabetes Education: Reviewed 'ABCs' of diabetes management (respective goals in parentheses):  A1C (<7), blood pressure (<130/80), and cholesterol (LDL <70) Eye Exam yearly and Dental Exam every 6 months. Foot exam done today Dietary recommendations reviewed in detail - he appears strongly motivated to improve diet and avoid initiation of insulin or other medication, will defer at this time  - recommended cutting down on chapati (flour/bread product), choose whole grains over processed whole wheat products - e.g. [employer] rice, quinoa, etc - emphasized limited animal fats, emphasis on whole foods, plant based Physical Activity recommendations reviewed - Strongly advised him to start checking sugars daily (fasting), and to check sugars when feeling symptomatic  - given sugar log and advised how to fill it and to bring it at next appt    Future Appointments  Date Time Provider [place]  11/04/2017  3:45 PM Liane Comber, NP [facility] None  02/11/2018  3:30 PM Unk Pinto, MD [facility] None  04/15/2018  9:00 AM [name], PA-C [facility] None

## 2017-08-11 ENCOUNTER — Encounter: Payer: Self-pay | Admitting: Adult Health

## 2017-08-11 ENCOUNTER — Ambulatory Visit (INDEPENDENT_AMBULATORY_CARE_PROVIDER_SITE_OTHER): Payer: Medicare Other | Admitting: Adult Health

## 2017-08-11 VITALS — BP 146/90 | HR 84 | Temp 97.5°F | Ht 61.0 in | Wt 141.0 lb

## 2017-08-11 DIAGNOSIS — E1122 Type 2 diabetes mellitus with diabetic chronic kidney disease: Secondary | ICD-10-CM | POA: Diagnosis not present

## 2017-08-11 DIAGNOSIS — I10 Essential (primary) hypertension: Secondary | ICD-10-CM | POA: Diagnosis not present

## 2017-08-11 DIAGNOSIS — N182 Chronic kidney disease, stage 2 (mild): Secondary | ICD-10-CM

## 2017-08-11 DIAGNOSIS — E1142 Type 2 diabetes mellitus with diabetic polyneuropathy: Secondary | ICD-10-CM

## 2017-08-11 DIAGNOSIS — E113293 Type 2 diabetes mellitus with mild nonproliferative diabetic retinopathy without macular edema, bilateral: Secondary | ICD-10-CM | POA: Diagnosis not present

## 2017-08-11 DIAGNOSIS — E782 Mixed hyperlipidemia: Secondary | ICD-10-CM

## 2017-08-11 DIAGNOSIS — E663 Overweight: Secondary | ICD-10-CM | POA: Diagnosis not present

## 2017-08-11 NOTE — Patient Instructions (Addendum)
Look into quinoa (grain), brown rice (whole grain products, avoid processed carbs)   Limit flour - white and whole wheat products - avoid sugar, highly processed foods  Choose minimally processed, whole foods    Unlimited: vegetables (except white potatoes)  Eat in moderation: Fresh fruit, beans, nuts, whole grains, sweet potatoes, nuts   Limited: meats, dairy, etc animal fats  Avoid: sugar, processed foods, flour      Bad carbs also include fruit juice, alcohol, and sweet tea. These are empty calories that do not signal to your brain that you are full.   Please remember the good carbs are still carbs which convert into sugar. So please measure them out no more than 1/2-1 cup of rice, oatmeal, pasta, and beans  Veggies are however free foods! Pile them on.   Not all fruit is created equal. Please see the list below, the fruit at the bottom is higher in sugars than the fruit at the top. Please avoid all dried fruits.       Preventing Type 2 Diabetes Mellitus Type 2 diabetes (type 2 diabetes mellitus) is a long-term (chronic) disease that affects blood sugar (glucose) levels. Normally, a hormone called insulin allows glucose to enter cells in the body. The cells use glucose for energy. In type 2 diabetes, one or both of these problems may be present:  The body does not make enough insulin.  The body does not respond properly to insulin that it makes (insulin resistance).  Insulin resistance or lack of insulin causes excess glucose to build up in the blood instead of going into cells. As a result, high blood glucose (hyperglycemia) develops, which can cause many complications. Being overweight or obese and having an inactive (sedentary) lifestyle can increase your risk for diabetes. Type 2 diabetes can be delayed or prevented by making certain nutrition and lifestyle changes. What nutrition changes can be made?  Eat healthy meals and snacks regularly. Keep a healthy snack with  you for when you get hungry between meals, such as fruit or a handful of nuts.  Eat lean meats and proteins that are low in saturated fats, such as chicken, fish, egg whites, and beans. Avoid processed meats.  Eat plenty of fruits and vegetables and plenty of grains that have not been processed (whole grains). It is recommended that you eat: ? 1?2 cups of fruit every day. ? 2?3 cups of vegetables every day. ? 6?8 oz of whole grains every day, such as oats, whole wheat, bulgur, brown rice, quinoa, and millet.  Eat low-fat dairy products, such as milk, yogurt, and cheese.  Eat foods that contain healthy fats, such as nuts, avocado, olive oil, and canola oil.  Drink water throughout the day. Avoid drinks that contain added sugar, such as soda or sweet tea.  Follow instructions from your health care provider about specific eating or drinking restrictions.  Control how much food you eat at a time (portion size). ? Check food labels to find out the serving sizes of foods. ? Use a kitchen scale to weigh amounts of foods.  Saute or steam food instead of frying it. Cook with water or broth instead of oils or butter.  Limit your intake of: ? Salt (sodium). Have no more than 1 tsp (2,400 mg) of sodium a day. If you have heart disease or high blood pressure, have less than ? tsp (1,500 mg) of sodium a day. ? Saturated fat. This is fat that is solid at room temperature, such as  butter or fat on meat. What lifestyle changes can be made?  Activity  Do moderate-intensity physical activity for at least 30 minutes on at least 5 days of the week, or as much as told by your health care provider.  Ask your health care provider what activities are safe for you. A mix of physical activities may be best, such as walking, swimming, cycling, and strength training.  Try to add physical activity into your day. For example: ? Park in spots that are farther away than usual, so that you walk more. For  example, park in a far corner of the parking lot when you go to the office or the grocery store. ? Take a walk during your lunch break. ? Use stairs instead of elevators or escalators. Weight Loss  Lose weight as directed. Your health care provider can determine how much weight loss is best for you and can help you lose weight safely.  If you are overweight or obese, you may be instructed to lose at least 5?7 % of your body weight. Alcohol and Tobacco   Limit alcohol intake to no more than 1 drink a day for nonpregnant women and 2 drinks a day for men. One drink equals 12 oz of beer, 5 oz of wine, or 1 oz of hard liquor.  Do not use any tobacco products, such as cigarettes, chewing tobacco, and e-cigarettes. If you need help quitting, ask your health care provider. Work With [place] Provider  Have your blood glucose tested regularly, as told by your health care provider.  Discuss your risk factors and how you can reduce your risk for diabetes.  Get screening tests as told by your health care provider. You may have screening tests regularly, especially if you have certain risk factors for type 2 diabetes.  Make an appointment with a diet and nutrition specialist (registered dietitian). A registered dietitian can help you make a healthy eating plan and can help you understand portion sizes and food labels. Why are these changes important?  It is possible to prevent or delay type 2 diabetes and related health problems by making lifestyle and nutrition changes.  It can be difficult to recognize signs of type 2 diabetes. The best way to avoid possible damage to your body is to take actions to prevent the disease before you develop symptoms. What can happen if changes are not made?  Your blood glucose levels may keep increasing. Having high blood glucose for a long time is dangerous. Too much glucose in your blood can damage your blood vessels, heart, kidneys, nerves, and  eyes.  You may develop prediabetes or type 2 diabetes. Type 2 diabetes can lead to many chronic health problems and complications, such as: ? Heart disease. ? Stroke. ? Blindness. ? Kidney disease. ? Depression. ? Poor circulation in the feet and legs, which could lead to surgical removal (amputation) in severe cases. Where to find support:  Ask your health care provider to recommend a registered dietitian, diabetes educator, or weight loss program.  Look for local or online weight loss groups.  Join a gym, fitness club, or outdoor activity group, such as a walking club. Where to find more information: To learn more about diabetes and diabetes prevention, visit:  American Diabetes Association (ADA): www.diabetes.[employer] of Diabetes and Digestive and Kidney Diseases: FindSpin.nl  To learn more about healthy eating, visit:  The U.S. Department of Agriculture Scientist, research (physical sciences)), Choose My Plate: http://wiley-williams.com/  Office of Disease Prevention  and Health Promotion (ODPHP), Dietary Guidelines: SurferLive.at  Summary  You can reduce your risk for type 2 diabetes by increasing your physical activity, eating healthy foods, and losing weight as directed.  Talk with your health care provider about your risk for type 2 diabetes. Ask about any blood tests or screening tests that you need to have. This information is not intended to replace advice given to you by your health care provider. Make sure you discuss any questions you have with your health care provider. Document Released: 06/19/2015 Document Revised: 08/03/2015 Document Reviewed: 04/18/2015 Elsevier Interactive Patient Education  [employer].

## 2017-08-19 ENCOUNTER — Other Ambulatory Visit: Payer: Self-pay

## 2017-08-19 MED ORDER — METOPROLOL TARTRATE 25 MG PO TABS: 25.0000 mg | ORAL_TABLET | Freq: Two times a day (BID) | ORAL | 1 refills | Status: DC

## 2017-08-19 NOTE — Patient Outreach (Signed)
[facility]) Care Management  08/19/2017  William Haney 12/09/1950 [id number]   Medication Adherence call to Mr. William Haney spoke with patient he is not sure if he is suppose to be taking Telmisartan 20 mg because he is also taking Metoprolol 25 mg. call doctor's office they said patient is to be on both medication because his blood pressure came up high last time he had an appointment. [facility] said patient does not have refill on Metoprolol 25mg  . doctor's office will call in this medication to the pharmacy. Mr. William Haney was inform his doctor will call in the refill on Metoprolol.patient is showing past due under [place].  [place] Management Direct Dial [phone]  Fax [phone] [email or web address]

## 2017-09-11 ENCOUNTER — Other Ambulatory Visit: Payer: Self-pay | Admitting: Adult Health

## 2017-09-23 ENCOUNTER — Other Ambulatory Visit: Payer: Self-pay | Admitting: Adult Health

## 2017-09-29 DIAGNOSIS — M1712 Unilateral primary osteoarthritis, left knee: Secondary | ICD-10-CM | POA: Diagnosis not present

## 2017-09-29 DIAGNOSIS — M1711 Unilateral primary osteoarthritis, right knee: Secondary | ICD-10-CM | POA: Diagnosis not present

## 2017-10-13 DIAGNOSIS — E113293 Type 2 diabetes mellitus with mild nonproliferative diabetic retinopathy without macular edema, bilateral: Secondary | ICD-10-CM | POA: Diagnosis not present

## 2017-10-13 DIAGNOSIS — H25813 Combined forms of age-related cataract, bilateral: Secondary | ICD-10-CM | POA: Diagnosis not present

## 2017-10-24 DIAGNOSIS — H2511 Age-related nuclear cataract, right eye: Secondary | ICD-10-CM | POA: Diagnosis not present

## 2017-10-24 DIAGNOSIS — H2513 Age-related nuclear cataract, bilateral: Secondary | ICD-10-CM | POA: Diagnosis not present

## 2017-10-30 ENCOUNTER — Other Ambulatory Visit: Payer: Self-pay | Admitting: Physician Assistant

## 2017-11-03 NOTE — Progress Notes (Signed)
MEDICARE ANNUAL WELLNESS VISIT AND FOLLOW UP Assessment:   Encounter for Medicare annual wellness exam  Essential hypertension - continue medications, DASH diet, exercise and monitor at home. Call if greater than 130/80.  -     CBC with Differential/Platelet -     CMP/GFR -     TSH  Atherosclerosis of native coronary artery of native heart without angina pectoris Control blood pressure, cholesterol, glucose, increase exercise.  -     Lipid panel -     Hemoglobin A1c  Stenosis of carotid artery, unspecified laterality Control blood pressure, cholesterol, glucose, increase exercise.  Followed by cardiology; stable/improved on follow up scan this past year -     Lipid panel -     Hemoglobin A1c  Type 2 diabetes mellitus with other circulatory complication, without long-term current use of insulin (HCC) Discussed general issues about diabetes pathophysiology and management., Educational material distributed., Suggested low cholesterol diet., Encouraged aerobic exercise., Discussed foot care., Reminded to get yearly retinal exam. -     Hemoglobin A1c  Diabetic polyneuropathy associated with type 2 diabetes mellitus (HCC) Intermittent burning in bilateral feet, declines medications Foot exam completed today Control sugars, check feet daily -     Hemoglobin A1c  Type 2 diabetes mellitus with stage 2 chronic kidney disease, without long-term current use of insulin ([place]) Discussed general issues about diabetes pathophysiology and management., Educational material distributed., Suggested low cholesterol diet., Encouraged aerobic exercise., Discussed foot care., Reminded to get yearly retinal exam- report for this year in system -     Hemoglobin A1c  Type 2 diabetes mellitus with diabetic retinopathy Control sugars, followed by Ophthalmology  Hyperlipidemia -continue medications, check lipids, decrease fatty foods, increase activity.  -     Lipid panel  Anemia, unspecified type -      CBC with Differential/Platelet  Medication management -     Magnesium  Vitamin D deficiency Continue vitamin D supplement Defer vit D level to next visit   Future Appointments  Date Time Provider [place]  02/11/2018  3:30 PM Unk Pinto, MD [facility] None  04/15/2018  9:00 AM [name], PA-C [facility] None      Plan:   During the course of the visit the patient was educated and counseled about appropriate screening and preventive services including:    Pneumococcal vaccine   Influenza vaccine  Td vaccine  Screening electrocardiogram  Colorectal cancer screening  Diabetes screening  Glaucoma screening  Nutrition counseling    Subjective:  William Haney is a 67 y.o. male who presents for Medicare Annual Wellness Visit and 3 month follow up for HTN, hyperlipidemia, diabetes, and vitamin D Def. He has a history of CAD s/p CABG in 2008, follows with Dr. Percival Spanish.   He follows with Dr. Berenice Primas for knee pain, has had shots, doing fairly but may have surgery.   BMI is Body mass index is 27.4 kg/m., he has been working on diet and exercise. Wt Readings from Last 3 Encounters:  11/04/17 145 lb (65.8 kg)  08/11/17 141 lb (64 kg)  07/24/17 143 lb 3.2 oz (65 kg)   His blood pressure has been controlled at home (120/80), today their BP is BP: 134/76 He does workout.  He denies chest pain, shortness of breath, dizziness.   He is on cholesterol medication (zetia, rosuvastatin 20 mg daily) .His cholesterol is not at goal. The cholesterol last visit was:   Lab Results  Component Value Date   CHOL 221 (H) 07/24/2017  HDL 40 (L) 07/24/2017   LDLCALC 154 (H) 07/24/2017   TRIG 138 07/24/2017   CHOLHDL 5.5 (H) 07/24/2017   He has been working on diet and exercise for diabetes with history of CAD/PAD ,he is on 81mg  ASA,he is not on ACE/ARB, he is on metformin 2000 mg daily, glimepiride 4 mg BID, and admits to some paresthesia's in his feet at times but  denies hypoglycemia , polydipsia, polyuria and visual disturbances. He does check fasting glucose, has been better and running 120-130 or so. Last A1C in the office was:  Lab Results  Component Value Date   HGBA1C 9.6 (H) 07/24/2017   Lab Results  Component Value Date   GFRNONAA 71 07/24/2017   Patient is on Vitamin D supplement.   Lab Results  Component Value Date   VD25OH 51 07/24/2017        Medication Review: Current Outpatient Medications on File Prior to Visit  Medication Sig  . aspirin 81 MG tablet Take 81 mg by mouth daily.  . Cholecalciferol (VITAMIN D3 PO) Take by mouth.  . Cinnamon 500 MG capsule Take 2 capsules (1,000 mg total) by mouth 2 (two) times daily.  . diclofenac (VOLTAREN) 75 MG EC tablet Take 75 mg by mouth 2 (two) times daily as needed for mild pain.   [name] ezetimibe (ZETIA) 10 MG tablet TAKE 1 TABLET BY MOUTH DAILY FOR CHOLESTEROL  . glimepiride (AMARYL) 4 MG tablet TAKE 1 TABLET BY MOUTH TWICE A DAY  . metFORMIN (GLUCOPHAGE) 500 MG tablet TAKE 2 TABLETS BY MOUTH EVERY MORNING AND 2 EVERY EVENING  . metoprolol tartrate (LOPRESSOR) 25 MG tablet TAKE 1 TABLET BY MOUTH TWICE A DAY  . Multiple Vitamin (MULTIVITAMIN) tablet Take 1 tablet by mouth daily.  . rosuvastatin (CRESTOR) 20 MG tablet Take 1 tablet by mouth daily.  [name] telmisartan (MICARDIS) 20 MG tablet TAKE 1 TABLET BY MOUTH EVERY DAY   No current facility-administered medications on file prior to visit.     Current Problems (verified) Patient Active Problem List   Diagnosis Date Noted  . Overweight (BMI 25.0-29.9) 08/10/2017  . Diabetic retinopathy of both eyes ([place]) 08/10/2017  . Poor compliance 09/09/2015  . DM type 2 (diabetes mellitus, type 2) ([place]) 09/08/2015  . Neuropathy, diabetic ([place]) 05/19/2015  . Encounter for Medicare annual wellness exam 09/21/2014  . Essential hypertension 10/11/2013  . Vitamin D deficiency 10/11/2013  . Medication management 10/11/2013  . Hyperlipidemia 03/01/2010   . Anemia 03/01/2010  . Coronary atherosclerosis 03/01/2010  . Carotid stenosis 03/01/2010    Screening Tests Immunization History  Administered Date(s) Administered  . DT 02/07/2015  . Influenza Whole 12/10/2012  . Influenza, High Dose Seasonal PF 02/07/2015, 01/02/2016, 12/13/2016  . Influenza-Unspecified 12/31/2013  . Pneumococcal Conjugate-13 02/02/2014  . Pneumococcal Polysaccharide-23 04/13/2003, 10/08/2016  . Td 04/18/2004   Preventative care: Tetanus:2016 Pneumovax: 2018 Prevnar 13: 2015 Flu vaccine:2018 Zostavax: declines  DEXA: declines Colonoscopy: 2014 due 2024  EGD:N/A Echo 01/2008 Stress test 2013 MRI head 03/2007 Carotid US- 02/2015 CXR 01/2013  Names of Other Physician/Practitioners you currently use: 1. [facility] here for primary care 2. [facility], Dr. Clydene Laming, eye doctor, last visit 04/14/2017 - report verified and abstracted 3. Dr. Burt Knack, dentist, last visit several years ago - has full dentures  Patient Care Team: Unk Pinto, MD as PCP - General (Internal Medicine) Keene Breath., MD as Consulting Physician (Ophthalmology) Minus Breeding, MD as Consulting Physician (Cardiology) Clarene Essex, MD as  Consulting Physician (Gastroenterology) Berenice Primas, MD as Referring Physician (Orthopedic Surgery)  History reviewed: allergies, current medications, past family history, past medical history, past social history, past surgical history and problem list  Allergies Allergies  Allergen Reactions  . Ace Inhibitors Cough  . Statins Other (See Comments)    Elevated LFT's    SURGICAL HISTORY He  has a past surgical history that includes Coronary artery bypass graft (2008). FAMILY HISTORY His family history includes Diabetes in his sister; Heart attack in his father; Heart disease in his brother and father; Hyperlipidemia in his father; Hypertension in his father. SOCIAL HISTORY He  reports that he  quit smoking about 22 years ago. He has never used smokeless tobacco. He reports that he does not drink alcohol or use drugs.  MEDICARE WELLNESS OBJECTIVES: Physical activity: Current Exercise Habits: Home exercise routine, Type of exercise: walking, Time (Minutes): 20, Frequency (Times/Week): 7, Weekly Exercise (Minutes/Week): 140, Intensity: Mild, Exercise limited by: None identified Cardiac risk factors: Cardiac Risk Factors include: advanced age (>8men, >71 women);male gender;hypertension;dyslipidemia;diabetes mellitus Depression/mood screen:   Depression screen [facility] 2/9 11/04/2017  Decreased Interest 0  Down, Depressed, Hopeless 0  PHQ - 2 Score 0    ADLs:  In your present state of health, do you have any difficulty performing the following activities: 11/04/2017 07/26/2017  Hearing? N N  Vision? N N  Difficulty concentrating or making decisions? N N  Walking or climbing stairs? N N  Dressing or bathing? N N  Doing errands, shopping? N N  Some recent data might be hidden     Cognitive Testing  Alert? Yes  Normal Appearance?Yes  Oriented to person? Yes  Place? Yes   Time? Yes  Recall of three objects?  Yes  Can perform simple calculations? Yes  Displays appropriate judgment?Yes  Can read the correct time from a watch face?Yes  EOL planning: Does Patient Have a Medical Advance Directive?: No Does patient want to make changes to medical advance directive?: No - Patient declined Would patient like information on creating a medical advance directive?: No - Patient declined   Objective:   Blood pressure 134/76, pulse 70, temperature (!) 96.6 F (35.9 C), height 5\' 1"  (1.549 m), weight 145 lb (65.8 kg), SpO2 99 %. Body mass index is 27.4 kg/m.  General appearance: alert, no distress, WD/WN, male HEENT: normocephalic, sclerae anicteric, TMs pearly, nares patent, no discharge or erythema, pharynx normal Oral cavity: MMM, no lesions Neck: supple, no lymphadenopathy, no  thyromegaly, no masses Heart: RRR, normal S1, S2, no murmurs Lungs: CTA bilaterally, no wheezes, rhonchi, or rales Abdomen: +bs, soft, non tender, non distended, no masses, no hepatomegaly, no splenomegaly Musculoskeletal: nontender, no swelling, varus deformity, antalgic gait, steady Extremities: no edema, no cyanosis, no clubbing Pulses: 2+ symmetric, upper and lower extremities, normal cap refill Neurological: alert, oriented x 3, CN2-12 intact, strength normal upper extremities and lower extremities, sensation abnormal bilateral feet, DTRs 2+ throughout, no cerebellar signs Psychiatric: normal affect, behavior normal, pleasant   Medicare Attestation I have personally reviewed: The patient's medical and social history Their use of alcohol, tobacco or illicit drugs Their current medications and supplements The patient's functional ability including ADLs,fall risks, home safety risks, cognitive, and hearing and visual impairment Diet and physical activities Evidence for depression or mood disorders  The patient's weight, height, BMI, and visual acuity have been recorded in the chart.  I have made referrals, counseling, and provided education to the patient based on review of the above  and I have provided the patient with a written personalized care plan for preventive services.     Izora Ribas, NP   11/04/2017

## 2017-11-04 ENCOUNTER — Encounter: Payer: Self-pay | Admitting: Adult Health

## 2017-11-04 ENCOUNTER — Ambulatory Visit (INDEPENDENT_AMBULATORY_CARE_PROVIDER_SITE_OTHER): Payer: Medicare Other | Admitting: Adult Health

## 2017-11-04 VITALS — BP 134/76 | HR 70 | Temp 96.6°F | Ht 61.0 in | Wt 145.0 lb

## 2017-11-04 DIAGNOSIS — Z0000 Encounter for general adult medical examination without abnormal findings: Secondary | ICD-10-CM | POA: Diagnosis not present

## 2017-11-04 DIAGNOSIS — E559 Vitamin D deficiency, unspecified: Secondary | ICD-10-CM

## 2017-11-04 DIAGNOSIS — N182 Chronic kidney disease, stage 2 (mild): Secondary | ICD-10-CM

## 2017-11-04 DIAGNOSIS — D649 Anemia, unspecified: Secondary | ICD-10-CM

## 2017-11-04 DIAGNOSIS — E1142 Type 2 diabetes mellitus with diabetic polyneuropathy: Secondary | ICD-10-CM

## 2017-11-04 DIAGNOSIS — E113293 Type 2 diabetes mellitus with mild nonproliferative diabetic retinopathy without macular edema, bilateral: Secondary | ICD-10-CM | POA: Diagnosis not present

## 2017-11-04 DIAGNOSIS — R6889 Other general symptoms and signs: Secondary | ICD-10-CM

## 2017-11-04 DIAGNOSIS — I10 Essential (primary) hypertension: Secondary | ICD-10-CM | POA: Diagnosis not present

## 2017-11-04 DIAGNOSIS — I2510 Atherosclerotic heart disease of native coronary artery without angina pectoris: Secondary | ICD-10-CM | POA: Diagnosis not present

## 2017-11-04 DIAGNOSIS — E1122 Type 2 diabetes mellitus with diabetic chronic kidney disease: Secondary | ICD-10-CM | POA: Diagnosis not present

## 2017-11-04 DIAGNOSIS — E663 Overweight: Secondary | ICD-10-CM

## 2017-11-04 DIAGNOSIS — I6529 Occlusion and stenosis of unspecified carotid artery: Secondary | ICD-10-CM | POA: Diagnosis not present

## 2017-11-04 DIAGNOSIS — E782 Mixed hyperlipidemia: Secondary | ICD-10-CM

## 2017-11-04 DIAGNOSIS — Z79899 Other long term (current) drug therapy: Secondary | ICD-10-CM

## 2017-11-04 NOTE — Patient Instructions (Signed)
  William Haney , Thank you for taking time to come for your Medicare Wellness Visit. I appreciate your ongoing commitment to your health goals. Please review the following plan we discussed and let me know if I can assist you in the future.   These are the goals we discussed: Goals    . Blood Pressure < 130/80    . HEMOGLOBIN A1C < 7.0    . LDL CALC < 70       This is a list of the screening recommended for you and due dates:  Health Maintenance  Topic Date Due  . Flu Shot  12/09/2017*  . Hemoglobin A1C  01/24/2018  . Eye exam for diabetics  04/14/2018  . Complete foot exam   11/05/2018  . Colon Cancer Screening  08/22/2022  . Tetanus Vaccine  02/06/2025  .  Hepatitis C: One time screening is recommended by Center for Disease Control  (CDC) for  adults born from [birth year] through 1967.   Completed  . Pneumonia vaccines  Completed  *Topic was postponed. The date shown is not the original due date.    Know what a healthy weight is for you (roughly BMI <25) and aim to maintain this  Aim for 7+ servings of fruits and vegetables daily  65-80+ fluid ounces of water or unsweet tea for healthy kidneys  Limit to max 1 drink of alcohol per day; avoid smoking/tobacco  Limit animal fats in diet for cholesterol and heart health - choose grass fed whenever available  Avoid highly processed foods, and foods high in saturated/trans fats  Aim for low stress - take time to unwind and care for your mental health  Aim for 150 min of moderate intensity exercise weekly for heart health, and weights twice weekly for bone health  Aim for 7-9 hours of sleep daily

## 2017-11-05 ENCOUNTER — Other Ambulatory Visit: Payer: Self-pay | Admitting: Adult Health

## 2017-11-05 DIAGNOSIS — E875 Hyperkalemia: Secondary | ICD-10-CM

## 2017-11-05 DIAGNOSIS — D649 Anemia, unspecified: Secondary | ICD-10-CM

## 2017-11-05 LAB — CBC WITH DIFFERENTIAL/PLATELET
Basophils Absolute: 73 cells/uL (ref 0–200)
Basophils Relative: 0.9 %
Eosinophils Absolute: 470 cells/uL (ref 15–500)
Eosinophils Relative: 5.8 %
HEMATOCRIT: 32.8 % — AB (ref 38.5–50.0)
HEMOGLOBIN: 9.7 g/dL — AB (ref 13.2–17.1)
LYMPHS ABS: 2057 {cells}/uL (ref 850–3900)
MCH: 20.1 pg — ABNORMAL LOW (ref 27.0–33.0)
MCHC: 29.6 g/dL — ABNORMAL LOW (ref 32.0–36.0)
MCV: 67.9 fL — AB (ref 80.0–100.0)
MPV: 12.2 fL (ref 7.5–12.5)
Monocytes Relative: 9 %
NEUTROS PCT: 58.9 %
Neutro Abs: 4771 cells/uL (ref 1500–7800)
Platelets: 310 10*3/uL (ref 140–400)
RBC: 4.83 10*6/uL (ref 4.20–5.80)
RDW: 16.1 % — AB (ref 11.0–15.0)
Total Lymphocyte: 25.4 %
WBC: 8.1 10*3/uL (ref 3.8–10.8)
WBCMIX: 729 {cells}/uL (ref 200–950)

## 2017-11-05 LAB — LIPID PANEL
CHOL/HDL RATIO: 2.7 (calc) (ref <5.0)
Cholesterol: 84 mg/dL (ref <200)
HDL: 31 mg/dL — AB (ref >40)
LDL CHOLESTEROL (CALC): 37 mg/dL
NON-HDL CHOLESTEROL (CALC): 53 mg/dL (ref <130)
Triglycerides: 75 mg/dL (ref <150)

## 2017-11-05 LAB — COMPLETE METABOLIC PANEL WITH GFR
AG Ratio: 1.8 (calc) (ref 1.0–2.5)
ALBUMIN MSPROF: 4.1 g/dL (ref 3.6–5.1)
ALT: 41 U/L (ref 9–46)
AST: 26 U/L (ref 10–35)
Alkaline phosphatase (APISO): 61 U/L (ref 40–115)
BUN: 11 mg/dL (ref 7–25)
CO2: 26 mmol/L (ref 20–32)
CREATININE: 1.01 mg/dL (ref 0.70–1.25)
Calcium: 9.6 mg/dL (ref 8.6–10.3)
Chloride: 102 mmol/L (ref 98–110)
GFR, Est African American: 88 mL/min/{1.73_m2} (ref >=60)
GFR, Est Non African American: 76 mL/min/{1.73_m2} (ref >=60)
GLUCOSE: 95 mg/dL (ref 65–99)
Globulin: 2.3 g/dL (calc) (ref 1.9–3.7)
Potassium: 5.5 mmol/L — ABNORMAL HIGH (ref 3.5–5.3)
Sodium: 136 mmol/L (ref 135–146)
TOTAL PROTEIN: 6.4 g/dL (ref 6.1–8.1)
Total Bilirubin: 0.5 mg/dL (ref 0.2–1.2)

## 2017-11-05 LAB — HEMOGLOBIN A1C
HEMOGLOBIN A1C: 9.3 %{Hb} — AB (ref <5.7)
Mean Plasma Glucose: 220 (calc)
eAG (mmol/L): 12.2 (calc)

## 2017-11-05 LAB — MAGNESIUM: Magnesium: 1.6 mg/dL (ref 1.5–2.5)

## 2017-11-05 LAB — TSH: TSH: 2.32 m[IU]/L (ref 0.40–4.50)

## 2017-11-05 LAB — CBC MORPHOLOGY

## 2017-11-14 ENCOUNTER — Other Ambulatory Visit: Payer: Self-pay | Admitting: Internal Medicine

## 2017-11-20 DIAGNOSIS — H2511 Age-related nuclear cataract, right eye: Secondary | ICD-10-CM | POA: Diagnosis not present

## 2017-11-20 DIAGNOSIS — H25811 Combined forms of age-related cataract, right eye: Secondary | ICD-10-CM | POA: Diagnosis not present

## 2017-11-21 DIAGNOSIS — H2513 Age-related nuclear cataract, bilateral: Secondary | ICD-10-CM | POA: Diagnosis not present

## 2017-12-10 ENCOUNTER — Other Ambulatory Visit: Payer: Self-pay

## 2017-12-10 NOTE — Patient Outreach (Signed)
[facility]) Care Management  12/10/2017  William Haney March 03, 1951 [id number]   Medication Adherence call to [name] left a message for patient to call back patient is due on Rosuvastatin 20 mg and Telmisartan 20 mg. Patient is showing past due under [place].  [place] Management Direct Dial [phone]  Fax [phone] [name]

## 2017-12-11 DIAGNOSIS — H25812 Combined forms of age-related cataract, left eye: Secondary | ICD-10-CM | POA: Diagnosis not present

## 2017-12-11 DIAGNOSIS — H2512 Age-related nuclear cataract, left eye: Secondary | ICD-10-CM | POA: Diagnosis not present

## 2017-12-31 NOTE — Progress Notes (Signed)
Assessment and Plan:  William Haney was seen today for back pain, shoulder pain and medication reaction.  Diagnoses and all orders for this visit:  Acute midline low back pain without sciatica Discussed unlikely to be related to kidneys, patient will try heat, rest, continue NSAIDs, follow up with new symptoms or if not resolving, can order imaging if needed.   Myalgia Left shoulder pain, ? R/t rosuvastatin, will check CK, try stopping cholesterol medication x 2 weeks and evaluate pain status. Can try restarting rosuvastatin 20 mg every other day, can add back zetia 10 mg daily if needed  -     CK  Hyperkalemia -     COMPLETE METABOLIC PANEL WITH GFR  Anemia, unspecified type Patient never returned after last visit for recheck as recommended, will complete this today.  -     CBC with Differential/Platelet -     POC Hemoccult Bld/Stl (3-Cd Home Screen); Future  Need for immunization against influenza -     Flu vaccine HIGH DOSE PF  Further disposition pending results of labs. Discussed med's effects and SE's.   Over 15 minutes of exam, counseling, chart review, and critical decision making was performed.   Future Appointments  Date Time Provider [place]  02/18/2018  9:30 AM Unk Pinto, MD [facility] None  05/27/2018  9:00 AM [name], PA-C [facility] None  11/09/2018  3:45 PM Wilma Flavin, Caryl Pina, NP [facility] None    ------------------------------------------------------------------------------------------------------------------   HPI BP 140/80   Pulse 83   Temp (!) 97.3 F (36.3 C)   Ht 5\' 1"  (1.549 m)   Wt 143 lb (64.9 kg)   SpO2 98%   BMI 27.02 kg/m   67 y.o.male T2 diabetic, presents for evaluation of mild midline lumbar back pain intermittent over the last week without radiation which he is concerned may be "kidney pain." He describes pain as mild ache, 2/10 intermittent, worse when sitting for extended periods. He denies radiation, LE weakness,  fever/chills, nausea, urine output changes, urine character changes.   He is also concerned about intermittent generalized L shoulder muscle ache over the past 3 months; he reports he does workout, pain not worse with activity. He reports he had similar symptoms several years ago when started on rosuvastatin, which he was recently restarted on. Currently taking 20 mg daily. Denies chest pain/pressure, dyspnea, radiating upper extremity pain or paraesthesia.    He was noted to have anemia and hyperkalemia (5.5 on 11/04/2017) at the last visit and was recommended to return for recheck in 1 week and complete hemoccult card which he never completed. He denies melena, hematochezia or other notable bleeding.   Lab Results  Component Value Date   WBC 8.1 11/04/2017   HGB 9.7 (L) 11/04/2017   HCT 32.8 (L) 11/04/2017   MCV 67.9 (L) 11/04/2017   PLT 310 11/04/2017    Lab Results  Component Value Date   GFRNONAA 76 11/04/2017    Past Medical History:  Diagnosis Date  . Anemia   . Carotid artery stenosis 01/2017   L-ICA 1-39%, R-ICA 40-59% doppler  . Coronary artery disease    s/p CABG in 2008, w/ LIMA-LAD, SVG-Diag, SVG-OM, SVG-PL  . Diabetes mellitus type II   . Hyperlipidemia   . Hypertension   . Vitamin D deficiency      Allergies  Allergen Reactions  . Ace Inhibitors Cough  . Statins Other (See Comments)    Elevated LFT's    Current Outpatient Medications on File Prior to Visit  Medication Sig  . aspirin 81 MG tablet Take 81 mg by mouth daily.  . Cholecalciferol (VITAMIN D3 PO) Take 5,000 Units by mouth.   . Cinnamon 500 MG capsule Take 2 capsules (1,000 mg total) by mouth 2 (two) times daily.  . diclofenac (VOLTAREN) 75 MG EC tablet Take 75 mg by mouth 2 (two) times daily as needed for mild pain.   [name] glimepiride (AMARYL) 4 MG tablet TAKE 1 TABLET BY MOUTH TWICE A DAY  . Magnesium 250 MG TABS Take by mouth daily.  . metFORMIN (GLUCOPHAGE) 500 MG tablet TAKE 2 TABLETS BY MOUTH  EVERY MORNING AND 2 EVERY EVENING  . metoprolol tartrate (LOPRESSOR) 25 MG tablet TAKE 1 TABLET BY MOUTH TWICE A DAY  . Multiple Vitamin (MULTIVITAMIN) tablet Take 1 tablet by mouth daily.  . rosuvastatin (CRESTOR) 20 MG tablet TAKE 1 TABLET BY MOUTH EVERY DAY  . telmisartan (MICARDIS) 20 MG tablet TAKE 1 TABLET BY MOUTH EVERY DAY   No current facility-administered medications on file prior to visit.     ROS: all negative except above.   Physical Exam:  BP 140/80   Pulse 83   Temp (!) 97.3 F (36.3 C)   Ht 5\' 1"  (1.549 m)   Wt 143 lb (64.9 kg)   SpO2 98%   BMI 27.02 kg/m   General Appearance: Well nourished, in no apparent distress. Eyes: PERRLA, EOMs, conjunctiva no swelling or erythema Sinuses: No Frontal/maxillary tenderness ENT/Mouth: Ext aud canals clear, TMs without erythema, bulging. No erythema, swelling, or exudate on post pharynx.  Tonsils not swollen or erythematous. Hearing normal.  Neck: Supple, thyroid normal.  Respiratory: Respiratory effort normal, BS equal bilaterally without rales, rhonchi, wheezing or stridor.  Cardio: RRR with no MRGs. Brisk peripheral pulses without edema.  Abdomen: Soft, + BS.  Non tender, no guarding, rebound, hernias, masses. Lymphatics: Non tender without lymphadenopathy.  Musculoskeletal: Full ROM, 5/5 strength, normal gait. Straight leg raising with dorsiflexion negative bilaterally for radicular symptoms. Sensory exam in the legs are normal. Knee reflexes are normal Ankle reflexes are normal Strength is normal and symmetric in arms and legs. There is not SI tenderness to palpation.  There is not paraspinal muscle spasm.  There is midline tenderness.  ROM of spine not limited due to pain. Some generalized tenderness without spasm or palpable abnormality through left shoulder/neck musculature.  Skin: Warm, dry without rashes, lesions, ecchymosis.  Neuro: Cranial nerves intact. Normal muscle tone, no cerebellar symptoms. Sensation intact.   Psych: Awake and oriented X 3, normal affect, Insight and Judgment appropriate.     Izora Ribas, NP 11:00 AM [facility]

## 2018-01-01 ENCOUNTER — Ambulatory Visit (INDEPENDENT_AMBULATORY_CARE_PROVIDER_SITE_OTHER): Payer: Medicare Other | Admitting: Adult Health

## 2018-01-01 ENCOUNTER — Encounter: Payer: Self-pay | Admitting: Adult Health

## 2018-01-01 VITALS — BP 140/80 | HR 83 | Temp 97.3°F | Ht 61.0 in | Wt 143.0 lb

## 2018-01-01 DIAGNOSIS — Z23 Encounter for immunization: Secondary | ICD-10-CM | POA: Diagnosis not present

## 2018-01-01 DIAGNOSIS — M545 Low back pain, unspecified: Secondary | ICD-10-CM

## 2018-01-01 DIAGNOSIS — D649 Anemia, unspecified: Secondary | ICD-10-CM | POA: Diagnosis not present

## 2018-01-01 DIAGNOSIS — E875 Hyperkalemia: Secondary | ICD-10-CM

## 2018-01-01 DIAGNOSIS — M791 Myalgia, unspecified site: Secondary | ICD-10-CM

## 2018-01-01 NOTE — Patient Instructions (Signed)
If labs are negative today, we will try stopping rosuvastatin 20 mg for 2 weeks, see if this improves your pain, then restart taking just every other day and see how you do.     Back Pain, Adult Many adults have back pain from time to time. Common causes of back pain include:  A strained muscle or ligament.  Wear and tear (degeneration) of the spinal disks.  Arthritis.  A hit to the back.  Back pain can be short-lived (acute) or last a long time (chronic). A physical exam, lab tests, and imaging studies may be done to find the cause of your pain. Follow these instructions at home: Managing pain and stiffness  Take over-the-counter and prescription medicines only as told by your health care provider.  If directed, apply heat to the affected area as often as told by your health care provider. Use the heat source that your health care provider recommends, such as a moist heat pack or a heating pad. ? Place a towel between your skin and the heat source. ? Leave the heat on for 20-30 minutes. ? Remove the heat if your skin turns bright red. This is especially important if you are unable to feel pain, heat, or cold. You have a greater risk of getting burned.  If directed, apply ice to the injured area: ? Put ice in a plastic bag. ? Place a towel between your skin and the bag. ? Leave the ice on for 20 minutes, 2-3 times a day for the first 2-3 days. Activity  Do not stay in bed. Resting more than 1-2 days can delay your recovery.  Take short walks on even surfaces as soon as you are able. Try to increase the length of time you walk each day.  Do not sit, drive, or stand in one place for more than 30 minutes at a time. Sitting or standing for long periods of time can put stress on your back.  Use proper lifting techniques. When you bend and lift, use positions that put less stress on your back: ? [place] your knees. ? Keep the load close to your body. ? Avoid twisting.  Exercise  regularly as told by your health care provider. Exercising will help your back heal faster. This also helps prevent back injuries by keeping muscles strong and flexible.  Your health care provider may recommend that you see a physical therapist. This person can help you come up with a safe exercise program. Do any exercises as told by your physical therapist. Lifestyle  Maintain a healthy weight. Extra weight puts stress on your back and makes it difficult to have good posture.  Avoid activities or situations that make you feel anxious or stressed. Learn ways to manage anxiety and stress. One way to manage stress is through exercise. Stress and anxiety increase muscle tension and can make back pain worse. General instructions  Sleep on a firm mattress in a comfortable position. Try lying on your side with your knees slightly bent. If you lie on your back, put a pillow under your knees.  Follow your treatment plan as told by your health care provider. This may include: ? Cognitive or behavioral therapy. ? Acupuncture or massage therapy. ? Meditation or yoga. Contact a health care provider if:  You have pain that is not relieved with rest or medicine.  You have increasing pain going down into your legs or buttocks.  Your pain does not improve in 2 weeks.  You have pain  at night.  You lose weight.  You have a fever or chills. Get help right away if:  You develop new bowel or bladder control problems.  You have unusual weakness or numbness in your arms or legs.  You develop nausea or vomiting.  You develop abdominal pain.  You feel faint. Summary  Many adults have back pain from time to time. A physical exam, lab tests, and imaging studies may be done to find the cause of your pain.  Use proper lifting techniques. When you bend and lift, use positions that put less stress on your back.  Take over-the-counter and prescription medicines and apply heat or ice as directed by  your health care provider. This information is not intended to replace advice given to you by your health care provider. Make sure you discuss any questions you have with your health care provider. Document Released: 02/25/2005 Document Revised: 04/01/2016 Document Reviewed: 04/01/2016 Elsevier Interactive Patient Education  [employer].

## 2018-01-02 LAB — COMPLETE METABOLIC PANEL WITH GFR
AG RATIO: 1.8 (calc) (ref 1.0–2.5)
ALT: 22 U/L (ref 9–46)
AST: 15 U/L (ref 10–35)
Albumin: 4.2 g/dL (ref 3.6–5.1)
Alkaline phosphatase (APISO): 62 U/L (ref 40–115)
BUN: 12 mg/dL (ref 7–25)
CALCIUM: 9.3 mg/dL (ref 8.6–10.3)
CO2: 26 mmol/L (ref 20–32)
Chloride: 97 mmol/L — ABNORMAL LOW (ref 98–110)
Creat: 0.98 mg/dL (ref 0.70–1.25)
GFR, EST NON AFRICAN AMERICAN: 78 mL/min/{1.73_m2} (ref >=60)
GFR, Est African American: 91 mL/min/{1.73_m2} (ref >=60)
Globulin: 2.3 g/dL (calc) (ref 1.9–3.7)
Glucose, Bld: 202 mg/dL — ABNORMAL HIGH (ref 65–99)
Potassium: 5.3 mmol/L (ref 3.5–5.3)
Sodium: 129 mmol/L — ABNORMAL LOW (ref 135–146)
TOTAL PROTEIN: 6.5 g/dL (ref 6.1–8.1)
Total Bilirubin: 0.5 mg/dL (ref 0.2–1.2)

## 2018-01-02 LAB — CK: CK TOTAL: 78 U/L (ref 44–196)

## 2018-01-02 LAB — CBC WITH DIFFERENTIAL/PLATELET
BASOS PCT: 0.8 %
Basophils Absolute: 66 cells/uL (ref 0–200)
EOS ABS: 490 {cells}/uL (ref 15–500)
Eosinophils Relative: 5.9 %
HCT: 36.7 % — ABNORMAL LOW (ref 38.5–50.0)
Hemoglobin: 11.1 g/dL — ABNORMAL LOW (ref 13.2–17.1)
Lymphs Abs: 1394 cells/uL (ref 850–3900)
MCH: 20.3 pg — AB (ref 27.0–33.0)
MCHC: 30.2 g/dL — AB (ref 32.0–36.0)
MCV: 67 fL — AB (ref 80.0–100.0)
MONOS PCT: 9.9 %
MPV: 11.8 fL (ref 7.5–12.5)
NEUTROS PCT: 66.6 %
Neutro Abs: 5528 cells/uL (ref 1500–7800)
PLATELETS: 341 10*3/uL (ref 140–400)
RBC: 5.48 10*6/uL (ref 4.20–5.80)
RDW: 16.4 % — AB (ref 11.0–15.0)
TOTAL LYMPHOCYTE: 16.8 %
WBC: 8.3 10*3/uL (ref 3.8–10.8)
WBCMIX: 822 {cells}/uL (ref 200–950)

## 2018-01-02 LAB — IRON,TIBC AND FERRITIN PANEL
%SAT: 18 % (calc) — ABNORMAL LOW (ref 20–48)
Ferritin: 29 ng/mL (ref 24–380)
IRON: 67 ug/dL (ref 50–180)
TIBC: 370 ug/dL (ref 250–425)

## 2018-01-02 LAB — TEST AUTHORIZATION

## 2018-01-02 LAB — CBC MORPHOLOGY

## 2018-01-05 ENCOUNTER — Other Ambulatory Visit: Payer: Self-pay | Admitting: Internal Medicine

## 2018-01-05 ENCOUNTER — Encounter: Payer: Self-pay | Admitting: Adult Health

## 2018-01-07 ENCOUNTER — Other Ambulatory Visit: Payer: Self-pay | Admitting: Internal Medicine

## 2018-01-07 DIAGNOSIS — E782 Mixed hyperlipidemia: Secondary | ICD-10-CM

## 2018-02-11 ENCOUNTER — Ambulatory Visit: Payer: Self-pay | Admitting: Internal Medicine

## 2018-02-18 ENCOUNTER — Ambulatory Visit (INDEPENDENT_AMBULATORY_CARE_PROVIDER_SITE_OTHER): Payer: Medicare Other | Admitting: Internal Medicine

## 2018-02-18 ENCOUNTER — Encounter: Payer: Self-pay | Admitting: Internal Medicine

## 2018-02-18 VITALS — BP 126/78 | HR 72 | Temp 97.2°F | Resp 16 | Ht 61.0 in | Wt 141.1 lb

## 2018-02-18 DIAGNOSIS — I10 Essential (primary) hypertension: Secondary | ICD-10-CM

## 2018-02-18 DIAGNOSIS — Z79899 Other long term (current) drug therapy: Secondary | ICD-10-CM

## 2018-02-18 DIAGNOSIS — E559 Vitamin D deficiency, unspecified: Secondary | ICD-10-CM | POA: Diagnosis not present

## 2018-02-18 DIAGNOSIS — N182 Chronic kidney disease, stage 2 (mild): Secondary | ICD-10-CM | POA: Diagnosis not present

## 2018-02-18 DIAGNOSIS — E1122 Type 2 diabetes mellitus with diabetic chronic kidney disease: Secondary | ICD-10-CM | POA: Diagnosis not present

## 2018-02-18 DIAGNOSIS — E782 Mixed hyperlipidemia: Secondary | ICD-10-CM

## 2018-02-18 NOTE — Patient Instructions (Signed)

## 2018-02-18 NOTE — Progress Notes (Signed)
This very nice 67 y.o. married [place] Man presents for 3 month follow up with HTN, HLD, Pre-Diabetes and Vitamin D Deficiency.       Patient is treated for HTN & BP has been controlled at home. Today's BP is at goal - 126/78.  In 2008, patient underwent CABG & has done well since. Patient is followed by Dr Percival Spanish. Patient has had no complaints of any cardiac type chest pain, palpitations, dyspnea / orthopnea / PND, dizziness, claudication, or dependent edema.     Hyperlipidemia is controlled with diet & meds. Patient denies myalgias or other med SE's. Last Lipids were at goal: Lab Results  Component Value Date   CHOL 84 11/04/2017   HDL 31 (L) 11/04/2017   LDLCALC 37 11/04/2017   TRIG 75 11/04/2017   CHOLHDL 2.7 11/04/2017      Also, the patient has history of T2_NIDDM since 2000 and he  has had no symptoms of reactive hypoglycemia, diabetic polys, paresthesias or visual blurring.  His last A1c was not at goal: Lab Results  Component Value Date   HGBA1C 9.3 (H) 11/04/2017      Further, the patient also has history of Vitamin D Deficiency ("37" / 2017)  and he supplements Vitamin D without any suspected side-effects. Last vitamin D was not at goal :  Lab Results  Component Value Date   VD25OH 51 07/24/2017   Current Outpatient Medications on File Prior to Visit  Medication Sig  . aspirin 81 MG tablet Take 81 mg by mouth daily.  . Cholecalciferol (VITAMIN D3 PO) Take 5,000 Units by mouth.   . Cinnamon 500 MG capsule Take 2 capsules (1,000 mg total) by mouth 2 (two) times daily.  . diclofenac (VOLTAREN) 75 MG EC tablet Take 75 mg by mouth 2 (two) times daily as needed for mild pain.   [name] glimepiride (AMARYL) 4 MG tablet TAKE 1 TABLET BY MOUTH TWICE A DAY  . Magnesium 250 MG TABS Take by mouth daily.  . metFORMIN (GLUCOPHAGE) 500 MG tablet TAKE 2 TABLETS BY MOUTH EVERY MORNING AND 2 EVERY EVENING  . metoprolol tartrate (LOPRESSOR) 25 MG tablet TAKE 1 TABLET BY MOUTH TWICE A DAY   . Multiple Vitamin (MULTIVITAMIN) tablet Take 1 tablet by mouth daily.  . rosuvastatin (CRESTOR) 20 MG tablet TAKE 1 TABLET BY MOUTH EVERY DAY  . telmisartan (MICARDIS) 20 MG tablet TAKE 1 TABLET BY MOUTH EVERY DAY   No current facility-administered medications on file prior to visit.    Allergies  Allergen Reactions  . Ace Inhibitors Cough  . Statins Other (See Comments)    Elevated LFT's   PMHx:   Past Medical History:  Diagnosis Date  . Anemia   . Carotid artery stenosis 01/2017   L-ICA 1-39%, R-ICA 40-59% doppler  . Coronary artery disease    s/p CABG in 2008, w/ LIMA-LAD, SVG-Diag, SVG-OM, SVG-PL  . Diabetes mellitus type II   . Hyperlipidemia   . Hypertension   . Vitamin D deficiency    Immunization History  Administered Date(s) Administered  . DT 02/07/2015  . Influenza Whole 12/10/2012  . Influenza, High Dose Seasonal PF 02/07/2015, 01/02/2016, 12/13/2016, 01/01/2018  . Influenza-Unspecified 12/31/2013  . Pneumococcal Conjugate-13 02/02/2014  . Pneumococcal Polysaccharide-23 04/13/2003, 10/08/2016  . Td 04/18/2004   Past Surgical History:  Procedure Laterality Date  . CORONARY ARTERY BYPASS GRAFT  2008   LIMA to the LAD, saphenous vein graft to diagonal, saphenous vein graft to  OM, saphenous vein graft to posterolateral 2008   FHx:    Reviewed / unchanged  SHx:    Reviewed / unchanged   Systems Review:  Constitutional: Denies fever, chills, wt changes, headaches, insomnia, fatigue, night sweats, change in appetite. Eyes: Denies redness, blurred vision, diplopia, discharge, itchy, watery eyes.  ENT: Denies discharge, congestion, post nasal drip, epistaxis, sore throat, earache, hearing loss, dental pain, tinnitus, vertigo, sinus pain, snoring.  CV: Denies chest pain, palpitations, irregular heartbeat, syncope, dyspnea, diaphoresis, orthopnea, PND, claudication or edema. Respiratory: denies cough, dyspnea, DOE, pleurisy, hoarseness, laryngitis, wheezing.   Gastrointestinal: Denies dysphagia, odynophagia, heartburn, reflux, water brash, abdominal pain or cramps, nausea, vomiting, bloating, diarrhea, constipation, hematemesis, melena, hematochezia  or hemorrhoids. Genitourinary: Denies dysuria, frequency, urgency, nocturia, hesitancy, discharge, hematuria or flank pain. Musculoskeletal: Denies arthralgias, myalgias, stiffness, jt. swelling, pain, limping or strain/sprain.  Skin: Denies pruritus, rash, hives, warts, acne, eczema or change in skin lesion(s). Neuro: No weakness, tremor, incoordination, spasms, paresthesia or pain. Psychiatric: Denies confusion, memory loss or sensory loss. Endo: Denies change in weight, skin or hair change.  Heme/Lymph: No excessive bleeding, bruising or enlarged lymph nodes.  Physical Exam  BP 126/78   Pulse 72   Temp (!) 97.2 F (36.2 C)   Resp 16   Ht 5\' 1"  (1.549 m)   Wt 141 lb 1.6 oz (64 kg)   BMI 26.66 kg/m   Appears  well nourished, well groomed  and in no distress.  Eyes: PERRLA, EOMs, conjunctiva no swelling or erythema. Sinuses: No frontal/maxillary tenderness ENT/Mouth: EAC's clear, TM's nl w/o erythema, bulging. Nares clear w/o erythema, swelling, exudates. Oropharynx clear without erythema or exudates. Oral hygiene is good. Tongue normal, non obstructing. Hearing intact.  Neck: Supple. Thyroid not palpable. Car 2+/2+ without bruits, nodes or JVD. Chest: Respirations nl with BS clear & equal w/o rales, rhonchi, wheezing or stridor.  Cor: Heart sounds normal w/ regular rate and rhythm without sig. murmurs, gallops, clicks or rubs. Peripheral pulses normal and equal  without edema.  Abdomen: Soft & bowel sounds normal. Non-tender w/o guarding, rebound, hernias, masses or organomegaly.  Lymphatics: Unremarkable.  Musculoskeletal: Full ROM all peripheral extremities, joint stability, 5/5 strength and normal gait.  Skin: Warm, dry without exposed rashes, lesions or ecchymosis apparent.  Neuro:  Cranial nerves intact, reflexes equal bilaterally. Sensory-motor testing grossly intact. Tendon reflexes grossly intact.  Pysch: Alert & oriented x 3.  Insight and judgement nl & appropriate. No ideations.  Assessment and Plan:  1. Essential hypertension  - Continue medication, monitor blood pressure at home.  - Continue DASH diet.  Reminder to go to the ER if any CP,  SOB, nausea, dizziness, severe HA, changes vision/speech.  - CBC with Differential/Platelet - COMPLETE METABOLIC PANEL WITH GFR - Magnesium - VITAMIN D 25 Hydroxy (Vit-D Deficiency, Fractures)  2. Hyperlipidemia, mixed  - Continue diet/meds, exercise,& lifestyle modifications.  - Continue monitor periodic cholesterol/liver & renal functions   - Lipid panel - VITAMIN D 25 Hydroxyl  3. Type 2 diabetes mellitus with stage 2 chronic kidney disease, without long-term current use of insulin (HCC)  - Continue diet, exercise,  - lifestyle modifications.  - Monitor appropriate labs.  - COMPLETE METABOLIC PANEL WITH GFR - Insulin, random - Hemoglobin A1c  4. Vitamin D deficiency  - Continue supplementation.  - VITAMIN D 25 Hydroxyl  5. Medication management  - CBC with Differential/Platelet - COMPLETE METABOLIC PANEL WITH GFR - Magnesium - Lipid panel - VITAMIN D 25  Hydroxyl - Insulin, random - Hemoglobin A1c       Discussed  regular exercise, BP monitoring, weight control to achieve/maintain BMI less than 25 and discussed med and SE's. Recommended labs to assess and monitor clinical status with further disposition pending results of labs. Over 30 minutes of exam, counseling, chart review was performed.

## 2018-02-19 ENCOUNTER — Other Ambulatory Visit: Payer: Self-pay | Admitting: Internal Medicine

## 2018-02-19 DIAGNOSIS — D649 Anemia, unspecified: Secondary | ICD-10-CM

## 2018-02-19 LAB — COMPLETE METABOLIC PANEL WITH GFR
AG Ratio: 1.7 (calc) (ref 1.0–2.5)
ALT: 25 U/L (ref 9–46)
AST: 16 U/L (ref 10–35)
Albumin: 4.1 g/dL (ref 3.6–5.1)
Alkaline phosphatase (APISO): 60 U/L (ref 40–115)
BUN: 14 mg/dL (ref 7–25)
CALCIUM: 9.3 mg/dL (ref 8.6–10.3)
CHLORIDE: 101 mmol/L (ref 98–110)
CO2: 29 mmol/L (ref 20–32)
Creat: 1.05 mg/dL (ref 0.70–1.25)
GFR, EST AFRICAN AMERICAN: 84 mL/min/{1.73_m2} (ref >=60)
GFR, EST NON AFRICAN AMERICAN: 72 mL/min/{1.73_m2} (ref >=60)
Globulin: 2.4 g/dL (calc) (ref 1.9–3.7)
Glucose, Bld: 221 mg/dL — ABNORMAL HIGH (ref 65–99)
Potassium: 5 mmol/L (ref 3.5–5.3)
SODIUM: 135 mmol/L (ref 135–146)
TOTAL PROTEIN: 6.5 g/dL (ref 6.1–8.1)
Total Bilirubin: 0.5 mg/dL (ref 0.2–1.2)

## 2018-02-19 LAB — CBC WITH DIFFERENTIAL/PLATELET
BASOS ABS: 61 {cells}/uL (ref 0–200)
Basophils Relative: 0.9 %
Eosinophils Absolute: 469 cells/uL (ref 15–500)
Eosinophils Relative: 6.9 %
HEMATOCRIT: 36.2 % — AB (ref 38.5–50.0)
Hemoglobin: 10.6 g/dL — ABNORMAL LOW (ref 13.2–17.1)
LYMPHS ABS: 1761 {cells}/uL (ref 850–3900)
MCH: 20 pg — AB (ref 27.0–33.0)
MCHC: 29.3 g/dL — ABNORMAL LOW (ref 32.0–36.0)
MCV: 68.2 fL — ABNORMAL LOW (ref 80.0–100.0)
MONOS PCT: 10.3 %
MPV: 12.3 fL (ref 7.5–12.5)
NEUTROS PCT: 56 %
Neutro Abs: 3808 cells/uL (ref 1500–7800)
Platelets: 304 10*3/uL (ref 140–400)
RBC: 5.31 10*6/uL (ref 4.20–5.80)
RDW: 15.3 % — ABNORMAL HIGH (ref 11.0–15.0)
Total Lymphocyte: 25.9 %
WBC mixed population: 700 cells/uL (ref 200–950)
WBC: 6.8 10*3/uL (ref 3.8–10.8)

## 2018-02-19 LAB — VITAMIN D 25 HYDROXY (VIT D DEFICIENCY, FRACTURES): Vit D, 25-Hydroxy: 54 ng/mL (ref 30–100)

## 2018-02-19 LAB — HEMOGLOBIN A1C
Hgb A1c MFr Bld: 9.6 % of total Hgb — ABNORMAL HIGH (ref <5.7)
Mean Plasma Glucose: 229 (calc)
eAG (mmol/L): 12.7 (calc)

## 2018-02-19 LAB — LIPID PANEL
CHOL/HDL RATIO: 3.1 (calc) (ref <5.0)
CHOLESTEROL: 101 mg/dL (ref <200)
HDL: 33 mg/dL — AB (ref >40)
LDL CHOLESTEROL (CALC): 51 mg/dL
NON-HDL CHOLESTEROL (CALC): 68 mg/dL (ref <130)
TRIGLYCERIDES: 90 mg/dL (ref <150)

## 2018-02-19 LAB — MAGNESIUM: Magnesium: 1.8 mg/dL (ref 1.5–2.5)

## 2018-02-19 LAB — INSULIN, RANDOM: Insulin: 4.2 u[IU]/mL (ref 2.0–19.6)

## 2018-02-24 DIAGNOSIS — M1712 Unilateral primary osteoarthritis, left knee: Secondary | ICD-10-CM | POA: Diagnosis not present

## 2018-02-24 DIAGNOSIS — M1711 Unilateral primary osteoarthritis, right knee: Secondary | ICD-10-CM | POA: Diagnosis not present

## 2018-02-24 DIAGNOSIS — M545 Low back pain: Secondary | ICD-10-CM | POA: Diagnosis not present

## 2018-02-26 ENCOUNTER — Other Ambulatory Visit: Payer: Self-pay | Admitting: Internal Medicine

## 2018-02-26 ENCOUNTER — Other Ambulatory Visit: Payer: Medicare Other

## 2018-02-26 DIAGNOSIS — D649 Anemia, unspecified: Secondary | ICD-10-CM

## 2018-02-28 ENCOUNTER — Other Ambulatory Visit: Payer: Self-pay | Admitting: Internal Medicine

## 2018-02-28 DIAGNOSIS — T464X5A Adverse effect of angiotensin-converting-enzyme inhibitors, initial encounter: Secondary | ICD-10-CM

## 2018-02-28 DIAGNOSIS — R05 Cough: Secondary | ICD-10-CM

## 2018-02-28 DIAGNOSIS — I10 Essential (primary) hypertension: Secondary | ICD-10-CM

## 2018-03-02 ENCOUNTER — Other Ambulatory Visit: Payer: Self-pay | Admitting: Internal Medicine

## 2018-03-05 ENCOUNTER — Other Ambulatory Visit: Payer: Self-pay

## 2018-03-05 NOTE — Patient Outreach (Signed)
[facility]) Care Management  03/05/2018  William Haney 01/25/1951 [id number]   Medication Adherence call to William Haney left a message for patient to call back patient is due on Telmisartan 20 mg. William Haney is showing past due under [place].   [place] [facility] Management Direct Dial [phone]  Fax [phone] [email or web address]

## 2018-03-06 LAB — HEMOGLOBINOPATHY EVALUATION
Fetal Hemoglobin Testing: <1 % (ref 0.0–1.9)
HCT: 35.3 % — ABNORMAL LOW (ref 38.5–50.0)
Hemoglobin A2 - HGBRFX: 4.7 % — ABNORMAL HIGH (ref 1.8–3.5)
Hemoglobin: 10.6 g/dL — ABNORMAL LOW (ref 13.2–17.1)
Hgb A: 94.3 % — ABNORMAL LOW (ref >96.0)
MCH: 20.6 pg — ABNORMAL LOW (ref 27.0–33.0)
MCV: 68.5 fL — ABNORMAL LOW (ref 80.0–100.0)
RBC: 5.15 10*6/uL (ref 4.20–5.80)
RDW: 16.7 % — AB (ref 11.0–15.0)

## 2018-03-06 LAB — FERRITIN: Ferritin: 38 ng/mL (ref 24–380)

## 2018-03-06 LAB — IRON, TOTAL/TOTAL IRON BINDING CAP
%SAT: 24 % (calc) (ref 20–48)
Iron: 84 ug/dL (ref 50–180)
TIBC: 350 mcg/dL (calc) (ref 250–425)

## 2018-03-08 ENCOUNTER — Encounter: Payer: Self-pay | Admitting: Internal Medicine

## 2018-03-08 DIAGNOSIS — D509 Iron deficiency anemia, unspecified: Secondary | ICD-10-CM | POA: Insufficient documentation

## 2018-03-09 ENCOUNTER — Encounter: Payer: Self-pay | Admitting: *Deleted

## 2018-04-01 ENCOUNTER — Other Ambulatory Visit: Payer: Self-pay | Admitting: Adult Health

## 2018-04-15 ENCOUNTER — Encounter: Payer: Self-pay | Admitting: Physician Assistant

## 2018-04-15 ENCOUNTER — Ambulatory Visit (INDEPENDENT_AMBULATORY_CARE_PROVIDER_SITE_OTHER): Payer: Medicare Other | Admitting: Adult Health

## 2018-04-15 ENCOUNTER — Encounter: Payer: Self-pay | Admitting: Adult Health

## 2018-04-15 VITALS — BP 102/56 | HR 77 | Temp 97.3°F | Ht 61.0 in | Wt 136.0 lb

## 2018-04-15 DIAGNOSIS — J00 Acute nasopharyngitis [common cold]: Secondary | ICD-10-CM | POA: Diagnosis not present

## 2018-04-15 MED ORDER — PREDNISONE 20 MG PO TABS: ORAL_TABLET | ORAL | 0 refills | Status: DC

## 2018-04-15 MED ORDER — PROMETHAZINE-DM 6.25-15 MG/5ML PO SYRP: 5.0000 mL | ORAL_SOLUTION | Freq: Four times a day (QID) | ORAL | 1 refills | Status: DC | PRN

## 2018-04-15 MED ORDER — AZITHROMYCIN 250 MG PO TABS: ORAL_TABLET | ORAL | 1 refills | Status: AC

## 2018-04-15 NOTE — Patient Instructions (Addendum)

## 2018-04-15 NOTE — Progress Notes (Signed)
Assessment and Plan:  William Haney was seen today for acute visit.  Diagnoses and all orders for this visit:  Acute nasopharyngitis Benign exam; likely viral and self-limited; Discussed the importance of avoiding unnecessary antibiotic therapy. Suggested symptomatic OTC remedies. Nasal saline spray for congestion. Nasal steroids, allergy pill, oral steroids offered Follow up as needed. Call back if getting worse, increasing fever/chills, dyspnea -     predniSONE (DELTASONE) 20 MG tablet; 2 tablets daily for 3 days, 1 tablet daily for 4 days. -     promethazine-dextromethorphan (PROMETHAZINE-DM) 6.25-15 MG/5ML syrup; Take 5 mLs by mouth 4 (four) times daily as needed for cough.  Further disposition pending results of labs. Discussed med's effects and SE's.   Over 15 minutes of exam, counseling, chart review, and critical decision making was performed.   Future Appointments  Date Time Provider [place]  05/27/2018  9:00 AM [name], PA-C [facility] None  11/09/2018  3:45 PM Wilma Flavin, Caryl Pina, NP [facility] None    ------------------------------------------------------------------------------------------------------------------   HPI BP (!) 102/56   Pulse 77   Temp (!) 97.3 F (36.3 C)   Ht 5\' 1"  (1.549 m)   Wt 136 lb (61.7 kg)   SpO2 99%   BMI 25.70 kg/m   67 y.o.male with htn, T2DM,presents for evaluation of 7 days of URI symptoms; he reports sore throat, productive of thick secretions, green mucus, also nasal congestion and low grade fever at home. He denies headache, body aches, dyspnea, rash. He reports feeling better in the last day or so.   He denies hx of allergies, he did have the flu vaccine this past season. He has been doing salt water gargles, ibuprofen/tylenol. Overall he feels he is starting to improve.   Past Medical History:  Diagnosis Date  . Anemia   . Carotid artery stenosis 01/2017   L-ICA 1-39%, R-ICA 40-59% doppler  . Coronary artery disease     s/p CABG in 2008, w/ LIMA-LAD, SVG-Diag, SVG-OM, SVG-PL  . Diabetes mellitus type II   . Hyperlipidemia   . Hypertension   . Vitamin D deficiency      Allergies  Allergen Reactions  . Ace Inhibitors Cough  . Statins Other (See Comments)    Elevated LFT's    Current Outpatient Medications on File Prior to Visit  Medication Sig  . aspirin 81 MG tablet Take 81 mg by mouth daily.  . Cholecalciferol (VITAMIN D3 PO) Take 5,000 Units by mouth.   . Cinnamon 500 MG capsule Take 2 capsules (1,000 mg total) by mouth 2 (two) times daily.  . diclofenac (VOLTAREN) 75 MG EC tablet Take 75 mg by mouth 2 (two) times daily as needed for mild pain.   [name] glimepiride (AMARYL) 4 MG tablet TAKE 1 TABLET BY MOUTH TWICE A DAY  . Magnesium 250 MG TABS Take by mouth daily.  . metFORMIN (GLUCOPHAGE) 500 MG tablet TAKE 2 TABLETS BY MOUTH EVERY MORNING AND 2 EVERY EVENING  . metoprolol tartrate (LOPRESSOR) 25 MG tablet TAKE 1 TABLET BY MOUTH TWICE A DAY  . Multiple Vitamin (MULTIVITAMIN) tablet Take 1 tablet by mouth daily.  . rosuvastatin (CRESTOR) 20 MG tablet TAKE 1 TABLET BY MOUTH EVERY DAY  . telmisartan (MICARDIS) 20 MG tablet Take 1 tablet Daily for BP & Kidney Protection   No current facility-administered medications on file prior to visit.     ROS: all negative except above.   Physical Exam:  BP (!) 102/56   Pulse 77   Temp (!) 97.3  F (36.3 C)   Ht 5\' 1"  (1.549 m)   Wt 136 lb (61.7 kg)   SpO2 99%   BMI 25.70 kg/m   General Appearance: Well nourished, in no apparent distress. Eyes: PERRLA, EOMs, conjunctiva no swelling or erythema Sinuses: No Frontal/maxillary tenderness ENT/Mouth: Ext aud canals clear, TMs without erythema, bulging. No erythema, swelling, or exudate on post pharynx.  Tonsils not swollen or erythematous. Hearing normal.  Neck: Supple Respiratory: Respiratory effort normal, BS equal bilaterally without rales, rhonchi, wheezing or stridor.  Cardio: RRR with no MRGs.  Brisk peripheral pulses without edema.  Abdomen: Soft, + BS.  Non tender. Lymphatics: Non tender without lymphadenopathy.  Musculoskeletal: normal gait.  Skin: Warm, dry without rashes, lesions, ecchymosis.  Neuro: Cranial nerves intact. Normal muscle tone. Psych: Awake and oriented X 3, normal affect, Insight and Judgment appropriate.     William Ribas, NP 10:53 AM [facility]

## 2018-05-06 NOTE — Progress Notes (Signed)
Cardiology Office Note   Date:  05/07/2018   ID:  William Haney, DOB 1950-08-29, MRN [id number]  PCP:  Unk Pinto, MD  Cardiologist:   No primary care provider on file.   Chief Complaint  Patient presents with  . Coronary Artery Disease      History of Present Illness: William Haney is a 68 y.o. male who presents for follow up of CAD.  The patient has had CABG as below.   Since I last saw him he is done okay.  He actually stopped his beta-blocker because his blood pressure was running low.  Today his blood pressure is high again.  He is otherwise felt okay.  He denies any chest pressure, neck or arm discomfort.  He is had no new palpitations, presyncope or syncope.  He said no weight gain or edema.   Past Medical History:  Diagnosis Date  . Anemia   . Carotid artery stenosis 01/2017   L-ICA 1-39%, R-ICA 40-59% doppler  . Coronary artery disease    s/p CABG in 2008, w/ LIMA-LAD, SVG-Diag, SVG-OM, SVG-PL  . Diabetes mellitus type II   . Hyperlipidemia   . Hypertension   . Vitamin D deficiency     Past Surgical History:  Procedure Laterality Date  . CORONARY ARTERY BYPASS GRAFT  2008   LIMA to the LAD, saphenous vein graft to diagonal, saphenous vein graft to OM, saphenous vein graft to posterolateral 2008     Current Outpatient Medications  Medication Sig Dispense Refill  . aspirin 81 MG tablet Take 81 mg by mouth daily.    . Cholecalciferol (VITAMIN D3 PO) Take 5,000 Units by mouth.     . Cinnamon 500 MG capsule Take 2 capsules (1,000 mg total) by mouth 2 (two) times daily. 60 capsule 3  . diclofenac (VOLTAREN) 75 MG EC tablet Take 75 mg by mouth 2 (two) times daily as needed for mild pain.     [name] glimepiride (AMARYL) 4 MG tablet TAKE 1 TABLET BY MOUTH TWICE A DAY 180 tablet 0  . Magnesium 250 MG TABS Take by mouth daily.    . metFORMIN (GLUCOPHAGE) 500 MG tablet TAKE 2 TABLETS BY MOUTH EVERY MORNING AND 2 EVERY EVENING 360 tablet 2  . metoprolol tartrate  (LOPRESSOR) 25 MG tablet TAKE 1 TABLET BY MOUTH TWICE A DAY 180 tablet 1  . Multiple Vitamin (MULTIVITAMIN) tablet Take 1 tablet by mouth daily.    . predniSONE (DELTASONE) 20 MG tablet 2 tablets daily for 3 days, 1 tablet daily for 4 days. 10 tablet 0  . promethazine-dextromethorphan (PROMETHAZINE-DM) 6.25-15 MG/5ML syrup Take 5 mLs by mouth 4 (four) times daily as needed for cough. 240 mL 1  . rosuvastatin (CRESTOR) 20 MG tablet TAKE 1 TABLET BY MOUTH EVERY DAY 90 tablet 1  . telmisartan (MICARDIS) 20 MG tablet Take 1 tablet Daily for BP & Kidney Protection 90 tablet 1  . dapagliflozin propanediol (FARXIGA) 10 MG TABS tablet Take 10 mg by mouth daily. 30 tablet 0   No current facility-administered medications for this visit.     Allergies:   Ace inhibitors and Statins    ROS:  Please see the history of present illness.   Otherwise, review of systems are positive for none.   All other systems are reviewed and negative.    PHYSICAL EXAM: VS:  BP (!) 173/89   Pulse 75   Ht 5\' 1"  (1.549 m)   Wt 141 lb (64 kg)  BMI 26.64 kg/m  , BMI Body mass index is 26.64 kg/m. GENERAL:  Well appearing NECK:  No jugular venous distention, waveform within normal limits, carotid upstroke brisk and symmetric, no bruits, no thyromegaly LUNGS:  Clear to auscultation bilaterally CHEST:  Well healed sternotomy scar. HEART:  PMI not displaced or sustained,S1 and S2 within normal limits, no S3, no S4, no clicks, no rubs, no murmurs ABD:  Flat, positive bowel sounds normal in frequency in pitch, no bruits, no rebound, no guarding, no midline pulsatile mass, no hepatomegaly, no splenomegaly EXT:  2 plus pulses throughout, no edema, no cyanosis no clubbing    EKG:  EKG is ordered today. The ekg ordered today demonstrates sinus rhythm, rate 75, axis within normal limits, early transition lead V2, no acute ST-T wave changes.   Recent Labs: 11/04/2017: TSH 2.32 02/18/2018: ALT 25; BUN 14; Creat 1.05;  Magnesium 1.8; Platelets 304; Potassium 5.0; Sodium 135 02/26/2018: Hemoglobin 10.6    Lipid Panel    Component Value Date/Time   CHOL 101 02/18/2018 0942   TRIG 90 02/18/2018 0942   HDL 33 (L) 02/18/2018 0942   CHOLHDL 3.1 02/18/2018 0942   VLDL 24 10/08/2016 1614   LDLCALC 51 02/18/2018 0942      Wt Readings from Last 3 Encounters:  05/07/18 141 lb (64 kg)  04/15/18 136 lb (61.7 kg)  02/18/18 141 lb 1.6 oz (64 kg)      Other studies Reviewed: Additional studies/ records that were reviewed today include: Labs. Review of the above records demonstrates:  Please see elsewhere in the note.     ASSESSMENT AND PLAN:  CAD/CABG: The patient has no ongoing chest pain.  No further cardiovascular testing is suggested.  He needs aggressive risk reduction.  DIABETES: His A1c is 9.5.  He is not well controlled.  He would benefit from SGLT1 inhibitor.  Cost has been a big issue.  We are looking into getting him on therapy and have started [place] .   With we will follow-up A1c in the future but will also defer to the expertise of Unk Pinto, MD  DYSLIPIDEMIA: His lipids are at target.  No change in therapy.  CAROTID STENOSIS:   In November 2018 his right carotid had 40 to 59% stenosis.  He is due for follow-up and I will order this.   Current medicines are reviewed at length with the patient today.  The patient does not have concerns regarding medicines.  The following changes have been made:  no change  Labs/ tests ordered today include: None  Orders Placed This Encounter  Procedures  . EKG 12-Lead     Disposition:   FU with our Risk Reduction Clinic in 3 weeks.      Signed, Minus Breeding, MD  05/07/2018 5:09 PM    [facility]

## 2018-05-07 ENCOUNTER — Encounter: Payer: Self-pay | Admitting: Cardiology

## 2018-05-07 ENCOUNTER — Other Ambulatory Visit: Payer: Self-pay | Admitting: Cardiology

## 2018-05-07 ENCOUNTER — Ambulatory Visit: Payer: Medicare Other | Admitting: Cardiology

## 2018-05-07 ENCOUNTER — Encounter: Payer: Self-pay | Admitting: *Deleted

## 2018-05-07 VITALS — BP 173/89 | HR 75 | Ht 61.0 in | Wt 141.0 lb

## 2018-05-07 DIAGNOSIS — E1169 Type 2 diabetes mellitus with other specified complication: Secondary | ICD-10-CM | POA: Diagnosis not present

## 2018-05-07 DIAGNOSIS — I2510 Atherosclerotic heart disease of native coronary artery without angina pectoris: Secondary | ICD-10-CM

## 2018-05-07 DIAGNOSIS — Z006 Encounter for examination for normal comparison and control in clinical research program: Secondary | ICD-10-CM

## 2018-05-07 DIAGNOSIS — I6523 Occlusion and stenosis of bilateral carotid arteries: Secondary | ICD-10-CM

## 2018-05-07 DIAGNOSIS — E785 Hyperlipidemia, unspecified: Secondary | ICD-10-CM

## 2018-05-07 DIAGNOSIS — I10 Essential (primary) hypertension: Secondary | ICD-10-CM | POA: Diagnosis not present

## 2018-05-07 MED ORDER — DAPAGLIFLOZIN PROPANEDIOL 10 MG PO TABS: 10.0000 mg | ORAL_TABLET | Freq: Every day | ORAL | 0 refills | Status: DC

## 2018-05-07 NOTE — Research (Signed)
COORDINATE-Diabetes BASELINE CASE REPORT FORM (Intervention) Site #:  [id number]              Patient ID: [id number]   INCLUSION CRITERIA  1. Is patient 18 years or older? [] No   [x] Yes  2. Based on the clinical record, does the patient have a history of type 2 diabetes mellitus? [] No   [x] Yes  3. Based on the clinical record, does the patient have a history of atherosclerotic cardiovascular disease, as defined by at least one of the clinical criteria below? [] No   [x] Yes   IF YES: Coronary Artery Disease Prior myocardial infarction Prior coronary artery bypass graft surgery Prior percutaneous coronary intervention Documented obstructive (i.e. ?50%) coronary artery disease (by angiography or CT) [] No  [] Yes [] No  [x] Yes [] No  [x] Yes [] No  [] Yes IF YES: date of MOST RECENT event: / /          mm dd yyyy   Cerebrovascular Disease Prior ischemic stroke Carotid artery stenosis (?50%) [x] No []  Yes [x] No []  Yes IF YES: date of MOST RECENT event:          / /          mm dd yyyy   Peripheral Arterial Disease Prior peripheral revascularization Prior amputation due to poor circulation History of Claudication with Ankle-brachial index <0.9 [x] No []  Yes [x] No []  Yes [x] No []  Yes IF YES: date of MOST RECENT event:          / /           mm dd yyyy  4. Patient's baseline guideline-based therapy score:    1 point Currently prescribed angiotensin converting enzyme inhibitor (ACEi), angiotensin receptor blocker (ARB) or Angiotensin Receptor Neprilysin inhibitor (ARNi) therapy [] No [x]  Yes   1 point Currently prescribed high intensity statin therapy (atorvastatin 40-80mg daily OR rosuvastatin 20-40mg daily) [] No [x]  Yes   1 point Most recent HbA1c <7% on metformin monotherapy? [x] No []  Yes    Calculated score:    EXCLUSION CRITERIA  REVIEW EACH CONDITION AND SELECT YES IF THE STATEMENT IS TRUE OR NO IF THE STATEMENT IS FALSE.  1. Determined to be highly unlikely to survive and/or to continue  follow-up in that clinic for at least 1 year, as identified by site investigator [x] No [] Yes  2. eGFR ? 30 ml/min/1.43m [x] No [] Yes  3. Baseline composite score of 3 for guideline recommended therapy [x] No [] Yes  4. Absolute contraindication to any of the 3 guideline recommended therapies    ACEi AND ARB therapy [x] No []  Yes    High intensity statin therapy [x] No  [] Yes    SGLT2I AND GLP1RA therapy [x] No  [] Yes   5. Already taking SGLT2i or GLP1RA therapy at baseline [x] No [] Yes     Subject Name: William Haney Subject met inclusion and exclusion criteria.  The informed consent form, study requirements and expectations were reviewed with the subject and questions and concerns were addressed prior to the signing of the consent form.  The subject verbalized understanding of the trial requirements.  The subject agreed to participate in the [place] [facility] trial and signed the informed consent on 05/07/18 at 4:15.The informed consent was obtained prior to performance of any protocol-specific procedures for the subject.  A copy of the signed informed consent was given to the subject and a copy was placed in the subject's medical record.   JLeonardHISTORY  Other Cardiovascular  Atrial Fibrillation or Atrial Flutter [x] No [] Yes  Hypertension [] No [  x]Yes  Hyperlipidemia [] No [x] Yes  Cigarette smoking [] Current [x] Former [] Never  Non-Cardiovascular  Anemia (Hgb<lower limit normal) [x] No [] Yes  Asthma [x] No [] Yes  Chronic lung disease [x] No [] Yes  Cancer - leukemia, lymphoma, or localized solid tumor [x] No [] Yes  Cancer - metastatic solid tumor [x] No [] Yes  Mild Liver Disease (Cirrhosis without portal hypertension) [x] No [] Yes  Moderate/Severe Liver Disease (Cirrhosis with portal hypertension) [x] No [] Yes  Connective Tissue Disease [x] No [] Yes  Dementia [x] No [] Yes  Depression [x] No [] Yes  Hemiplegia or paraplegia [x] No [] Yes  Human immunodeficiency virus [x] No [] Yes    Obstructive Sleep Apnea [x] No [] Yes  Peptic Ulcer Disease [] No [] Yes  Renal insufficiency [x] No [] Yes  Drug or Alcohol Abuse [] Current [] Former [x] Never  Diabetes History  Year of diagnosis of type 2 diabetes        2001 per patient   History of diabetic ketoacidosis? [x] No [] Yes   Are any of the following complications of diabetes present (Select all that apply): [] Retinopathy [] Neuropathy [] Foot complications      (including ulcers, calluses,         amputation) [] Gastroparesis [x] None of the above        [] No [x] Yes  Subject educated on Coordinate diabetes and all educational materials given to subject.

## 2018-05-07 NOTE — Patient Instructions (Signed)
Medication Instructions:  START- Farxiga 10 mg daily  If you need a refill on your cardiac medications before your next appointment, please call your pharmacy.  Labwork: None Ordered   Testing/Procedures: Your physician has requested that you have a carotid duplex. This test is an ultrasound of the carotid arteries in your neck. It looks at blood flow through these arteries that supply the brain with blood. Allow one hour for this exam. There are no restrictions or special instructions.  Follow-Up: . Your physician recommends that you schedule a follow-up appointment in: 2-3 weeks Hypertension clinic   At [facility], you and your health needs are our priority.  As part of our continuing mission to provide you with exceptional heart care, we have created designated Provider Care Teams.  These Care Teams include your primary Cardiologist (physician) and Advanced Practice Providers (APPs -  Physician Assistants and Nurse Practitioners) who all work together to provide you with the care you need, when you need it.   Thank you for choosing [facility] at [facility]!!

## 2018-05-24 NOTE — Progress Notes (Signed)
Complete Physical  Assessment and Plan: Essential hypertension - continue medications, DASH diet, exercise and monitor at home. Call if greater than 130/80.  - CBC with Differential/Platelet - BASIC METABOLIC PANEL WITH GFR - Hepatic function panel - TSH - Urinalysis, Routine w reflex microscopic (not at [facility]) - Microalbumin / creatinine urine ratio  Atherosclerosis of native coronary artery of native heart without angina pectoris Control blood pressure, cholesterol, glucose, increase exercise.  Continue cardio follow up   Carotid stenosis, bilateral Control blood pressure, cholesterol, glucose, increase exercise.   Type 2 diabetes mellitus with other circulatory complication [facility]) Discussed general issues about diabetes pathophysiology and management., Educational material distributed., Suggested low cholesterol diet., Encouraged aerobic exercise., Discussed foot care., Reminded to get yearly retinal exam. - Hemoglobin A1c   Hyperlipidemia -continue medications, check lipids, decrease fatty foods, increase activity.  - Lipid panel   Anemia, unspecified anemia type - monitor, continue iron supp with Vitamin C and increase green leafy veggies   Medication management - Magnesium   Vitamin D deficiency - VITAMIN D 25 Hydroxy (Vit-D Deficiency, Fractures)   Encounter for general adult medical examination with abnormal findings 1 YEAR  Diabetic polyneuropathy associated with type 2 diabetes mellitus ([place]) -     Hemoglobin A1c Given samples of farxiga  Type 2 diabetes mellitus with stage 1 chronic kidney disease, without long-term current use of insulin (HCC) -     Hemoglobin A1c - GETTING EYE EXAM FROM DR. Sherral Hammers - continue farxiga - he is now on broad for bringing down his sugars, counseled  BMI 27.0-27.9,adult  Overweight  - long discussion about weight loss, diet, and exercise -recommended diet heavy in fruits and veggies and low in animal meats, cheeses, and dairy  products   Discussed med's effects and SE's. Screening labs and tests as requested with regular follow-up as recommended. Future Appointments  Date Time Provider [place]  06/09/2018  9:00 AM [facility] VASC 2 [facility]  11/09/2018  3:45 PM Liane Comber, NP [facility] None  05/31/2019  9:00 AM [name], PA-C [facility] None    HPI Patient presents for a complete physical.    His blood pressure has been controlled at home, today their BP is BP: 138/76  BMI is Body mass index is 26.04 kg/m., he is working on diet and exercise. Wt Readings from Last 3 Encounters:  05/27/18 137 lb 12.8 oz (62.5 kg)  05/26/18 136 lb 12.8 oz (62.1 kg)  05/07/18 141 lb (64 kg)    He does workout, walks. He denies chest pain, shortness of breath, dizziness.   He follows with Dr. Lenard Forth doing well, he had a CABG in 2008, on bASA, and lopressor. His sugars are poorly controlled, we have discussed this every visit but he wants a knee replacement and is now willing to work on his sugars for a surgery and Dr. Percival Spanish has discussed with him.  He is on cholesterol medication.His cholesterol is not at goal of less than 70. The cholesterol last visit was:   Lab Results  Component Value Date   CHOL 101 02/18/2018   HDL 33 (L) 02/18/2018   LDLCALC 51 02/18/2018   TRIG 90 02/18/2018   CHOLHDL 3.1 02/18/2018   He has had diabetes since 2000.  He has been working on diet and exercise for diabetes with CAD, he is on bASA, MF 2000 mg total and amaryl 4mg  BID, HE WAS STARTED ON FARXIGA by cardiologist, stopped magnesium due to elevated potassium, will occ get  low sugar, lowest is 70, and denies paresthesia of the feet, polydipsia and polyuria. He is NOT at goal less than 8. Last A1C in the office was:  Lab Results  Component Value Date   HGBA1C 9.6 (H) 02/18/2018   Lab Results  Component Value Date   GFRNONAA 76 05/26/2018   Patient is on Vitamin D supplement.   Lab Results  Component  Value Date   VD25OH 54 02/18/2018     Follows with Dr. Berenice Primas for bilateral knee pain. He wants to get his left knee replaced but needs his sugars lower in order to operate.    Current Medications:  Current Outpatient Medications on File Prior to Visit  Medication Sig Dispense Refill  . aspirin 81 MG tablet Take 81 mg by mouth daily.    . Cholecalciferol (VITAMIN D3 PO) Take 5,000 Units by mouth.     . Cinnamon 500 MG capsule Take 2 capsules (1,000 mg total) by mouth 2 (two) times daily. 60 capsule 3  . dapagliflozin propanediol (FARXIGA) 10 MG TABS tablet Take 10 mg by mouth daily. 30 tablet 6  . glimepiride (AMARYL) 4 MG tablet TAKE 1 TABLET BY MOUTH TWICE A DAY 180 tablet 0  . Magnesium 250 MG TABS Take by mouth daily.    . metFORMIN (GLUCOPHAGE) 500 MG tablet TAKE 2 TABLETS BY MOUTH EVERY MORNING AND 2 EVERY EVENING 360 tablet 2  . metoprolol tartrate (LOPRESSOR) 25 MG tablet TAKE 1 TABLET BY MOUTH TWICE A DAY 180 tablet 1  . Multiple Vitamin (MULTIVITAMIN) tablet Take 1 tablet by mouth daily.    . promethazine-dextromethorphan (PROMETHAZINE-DM) 6.25-15 MG/5ML syrup Take 5 mLs by mouth 4 (four) times daily as needed for cough. 240 mL 1  . rosuvastatin (CRESTOR) 20 MG tablet TAKE 1 TABLET BY MOUTH EVERY DAY 90 tablet 1   No current facility-administered medications on file prior to visit.    Health Maintenance:  Immunization History  Administered Date(s) Administered  . DT 02/07/2015  . Influenza Whole 12/10/2012  . Influenza, High Dose Seasonal PF 02/07/2015, 01/02/2016, 12/13/2016, 01/01/2018  . Influenza-Unspecified 12/31/2013  . Meningococcal Conjugate 09/03/2016  . Pneumococcal Conjugate-13 02/02/2014  . Pneumococcal Polysaccharide-23 04/13/2003, 10/08/2016  . Td 04/18/2004   Tetanus: 2016 Pneumovax: 2005 Prevnar 13: 2015 Flu vaccine:2019 Zostavax: declines  DEXA: declines Colonoscopy: 2014 due 2024 EGD: N/A Dr. Clydene Laming eye doctor - has OV next month, q 3 months,  glacoma Echo 2009 MRI 2009 US carotid 01/2017  Patient Care Team: Unk Pinto, MD as PCP - General (Internal Medicine) Keene Breath., MD as Consulting Physician (Ophthalmology) Minus Breeding, MD as Consulting Physician (Cardiology) Clarene Essex, MD as Consulting Physician (Gastroenterology) Berenice Primas, MD as Referring Physician (Orthopedic Surgery)  Medical History:  Past Medical History:  Diagnosis Date  . Anemia   . Carotid artery stenosis 01/2017   L-ICA 1-39%, R-ICA 40-59% doppler  . Coronary artery disease    s/p CABG in 2008, w/ LIMA-LAD, SVG-Diag, SVG-OM, SVG-PL  . Diabetes mellitus type II   . Hyperlipidemia   . Hypertension   . Vitamin D deficiency    Allergies Allergies  Allergen Reactions  . Ace Inhibitors Cough  . Statins Other (See Comments)    Elevated LFT's    SURGICAL HISTORY He  has a past surgical history that includes Coronary artery bypass graft (2008). FAMILY HISTORY His family history includes Diabetes in his sister; Heart attack in his father; Heart disease in his brother and father; Hyperlipidemia in his  father; Hypertension in his father. SOCIAL HISTORY He  reports that he quit smoking about 22 years ago. He has never used smokeless tobacco. He reports that he does not drink alcohol or use drugs.  Review of Systems  Constitutional: Negative.   HENT: Negative.   Respiratory: Negative.   Cardiovascular: Negative for chest pain, palpitations, orthopnea, claudication, leg swelling and PND.  Gastrointestinal: Negative.   Genitourinary: Negative.   Musculoskeletal: Negative for back pain, falls, joint pain, myalgias and neck pain.  Skin: Negative.   Neurological: Positive for tingling (right hand 4th and 5th digit off and on). Negative for dizziness, tremors, sensory change, speech change, focal weakness, seizures, loss of consciousness and headaches.  Psychiatric/Behavioral: Negative.      Physical Exam: Estimated body mass index  is 26.04 kg/m as calculated from the following:   Height as of this encounter: 5\' 1"  (1.549 m).   Weight as of this encounter: 137 lb 12.8 oz (62.5 kg). BP 138/76   Pulse 83   Temp 97.8 F (36.6 C)   Ht 5\' 1"  (1.549 m)   Wt 137 lb 12.8 oz (62.5 kg)   SpO2 99%   BMI 26.04 kg/m  General Appearance: Well nourished, in no apparent distress.  Eyes: PERRLA, EOMs, conjunctiva no swelling or erythema, normal fundi and vessels.  Sinuses: No Frontal/maxillary tenderness  ENT/Mouth: Ext aud canals clear, normal light reflex with TMs without erythema, bulging. Good dentition. No erythema, swelling, or exudate on post pharynx. Tonsils not swollen or erythematous. Hearing normal.  Neck: Supple, thyroid normal. No bruits  Respiratory: Respiratory effort normal, BS equal bilaterally without rales, rhonchi, wheezing or stridor.  Cardio: RRR with systolic murmur RSB without rubs or gallops. Decreased hair bilateral legs, decrease TP/DP pulses, good bilateral sensation, without edema or ulcers.  Chest: symmetric, with normal excursions and percussion.  Abdomen: Soft, nontender, no guarding, rebound, hernias, masses, or organomegaly. .  Lymphatics: Non tender without lymphadenopathy.  Genitourinary: defer Musculoskeletal: Full ROM all peripheral extremities,5/5 strength, and normal gait,   Skin: Warm, dry without rashes, lesions, ecchymosis. Neuro: Cranial nerves intact, reflexes equal bilaterally. Normal muscle tone, no cerebellar symptoms. Sensation intact.  Psych: Awake and oriented X 3, normal affect, Insight and Judgment appropriate.   EKG: defer cardio AORTA SCAN: defer  [name] 9:31 AM [facility]

## 2018-05-25 DIAGNOSIS — M545 Low back pain: Secondary | ICD-10-CM | POA: Diagnosis not present

## 2018-05-25 DIAGNOSIS — M1711 Unilateral primary osteoarthritis, right knee: Secondary | ICD-10-CM | POA: Diagnosis not present

## 2018-05-25 DIAGNOSIS — M1712 Unilateral primary osteoarthritis, left knee: Secondary | ICD-10-CM | POA: Diagnosis not present

## 2018-05-26 ENCOUNTER — Ambulatory Visit (INDEPENDENT_AMBULATORY_CARE_PROVIDER_SITE_OTHER): Payer: Medicare Other | Admitting: Pharmacist Clinician (PhC)/ Clinical Pharmacy Specialist

## 2018-05-26 ENCOUNTER — Telehealth: Payer: Self-pay

## 2018-05-26 ENCOUNTER — Other Ambulatory Visit: Payer: Self-pay

## 2018-05-26 VITALS — BP 122/78 | HR 89 | Resp 15 | Ht 61.0 in | Wt 136.8 lb

## 2018-05-26 DIAGNOSIS — E1169 Type 2 diabetes mellitus with other specified complication: Secondary | ICD-10-CM

## 2018-05-26 DIAGNOSIS — E782 Mixed hyperlipidemia: Secondary | ICD-10-CM

## 2018-05-26 DIAGNOSIS — I10 Essential (primary) hypertension: Secondary | ICD-10-CM | POA: Diagnosis not present

## 2018-05-26 LAB — BASIC METABOLIC PANEL
BUN / CREAT RATIO: 19 (ref 10–24)
BUN: 19 mg/dL (ref 8–27)
CHLORIDE: 102 mmol/L (ref 96–106)
CO2: 22 mmol/L (ref 20–29)
Calcium: 9.4 mg/dL (ref 8.6–10.2)
Creatinine, Ser: 1.01 mg/dL (ref 0.76–1.27)
GFR calc Af Amer: 87 mL/min/{1.73_m2} (ref >59)
GFR calc non Af Amer: 76 mL/min/{1.73_m2} (ref >59)
Glucose: 124 mg/dL — ABNORMAL HIGH (ref 65–99)
Potassium: 5.9 mmol/L (ref 3.5–5.2)
Sodium: 138 mmol/L (ref 134–144)

## 2018-05-26 MED ORDER — DAPAGLIFLOZIN PROPANEDIOL 10 MG PO TABS: 10.0000 mg | ORAL_TABLET | Freq: Every day | ORAL | 6 refills | Status: DC

## 2018-05-26 NOTE — Telephone Encounter (Signed)
Received a call from Lillie with a critical potassium of 5.8. Pharm D made aware.

## 2018-05-26 NOTE — Progress Notes (Signed)
05/27/2018 William Haney 1950-09-17 [id number]   HPI:  William Haney is a 68 y.o. male patient of Dr Percival Spanish, with a PMH below who presents today for COORDINATE-Diabetes study follow up.  In addition to DM2 (A1c 9.6), hypertension and hyperlipidemia (LDL 51 with baseline of 154), his medical history is significant for CAD, including carotid stenosis, diabetic neuropathy, diabetic retinopathy and vitamin D deficiency.  He was seen by Dr. Percival Spanish on February 27 and entered into the COORDINATE-Diabetes study.  This study is a cluster-randomized clinical trial to test the effectiveness of an innovative, clinic-level educational intervention to improve the management of patients with type 2 diabetes mellitus and cardiovascular disease.  Patient agreed to the study parameters and was started on Farxiga 10 mg daily in addition to his telmisartan and rosuvastatin.    Since starting the Farxiga, patient reports a noticeable drop in his morning fasting glucose levels.  Prior to starting the medication he would get fasting glucose levels between 250-300.  But in the past 2 weeks he has seen more in the 150-180 range.  This was very encouraging for the patient, but unfortunately he ran out of samples prior to today's visit and has not had a dose for several days.  He is tentatively planning to have knee surgery next month, although his surgeon is requiring that his A1c be < 7 for this to happen.  Patient reports feeling well today.  No negative issues with the addition of [place].    Blood Pressure Goal:  130/80  Current Medications:  Metoprolol 25 bid  Telmisartan 20 qd  Rosuvastatin 20 mg qd  Glimiperide 4 bid  Metformin 1 gm bid  Farxiga 10 mg qd  Family Hx:  Father died at 65 old age  Mother died recently at 18  Brother and sister both with DM  Children (4) all without health issues  Social Hx:  No tobacco, alcohol;  Admits to plenty of caffeine - [employer] on a regular basis  Diet:   Patient is trying to improve his diet.  His wife is a vegetarian, but he does enjoy chicken and occasional beef.  He is trying to increase his vegetable consumption.  Admits to eating more fruits than veggies, but is trying to avoid processed sugary foods.  They do add salt to some foods when cooking, but is not adding any at the table.    Exercise:  Currently unable to do much in the way of exercise, waiting for knee surgery clearance   Home BP readings: mostly WNL, shows a list of about 10-15 readings this morning, nothing as high as 109 systolic  Intolerances:   ACEI - cough  There is a note in his allergies that statins increase LFTs, however he is currently on rosuvastatin 20 and last LFTs were all WNL  Labs:   02/18/18:  Na 135, K 5.0, Glu 221, BUN 14, SCr 1.05, GFR 72  Wt Readings from Last 3 Encounters:  05/26/18 136 lb 12.8 oz (62.1 kg)  05/07/18 141 lb (64 kg)  04/15/18 136 lb (61.7 kg)   BP Readings from Last 3 Encounters:  05/26/18 122/78  05/07/18 (!) 173/89  04/15/18 (!) 102/56   Pulse Readings from Last 3 Encounters:  05/26/18 89  05/07/18 75  04/15/18 77    Current Outpatient Medications  Medication Sig Dispense Refill  . aspirin 81 MG tablet Take 81 mg by mouth daily.    . Cholecalciferol (VITAMIN D3 PO) Take 5,000 Units by  mouth.     . Cinnamon 500 MG capsule Take 2 capsules (1,000 mg total) by mouth 2 (two) times daily. 60 capsule 3  . diclofenac (VOLTAREN) 75 MG EC tablet Take 75 mg by mouth 2 (two) times daily as needed for mild pain.     [name] glimepiride (AMARYL) 4 MG tablet TAKE 1 TABLET BY MOUTH TWICE A DAY 180 tablet 0  . Magnesium 250 MG TABS Take by mouth daily.    . metFORMIN (GLUCOPHAGE) 500 MG tablet TAKE 2 TABLETS BY MOUTH EVERY MORNING AND 2 EVERY EVENING 360 tablet 2  . metoprolol tartrate (LOPRESSOR) 25 MG tablet TAKE 1 TABLET BY MOUTH TWICE A DAY 180 tablet 1  . Multiple Vitamin (MULTIVITAMIN) tablet Take 1 tablet by mouth daily.    . predniSONE  (DELTASONE) 20 MG tablet 2 tablets daily for 3 days, 1 tablet daily for 4 days. 10 tablet 0  . rosuvastatin (CRESTOR) 20 MG tablet TAKE 1 TABLET BY MOUTH EVERY DAY 90 tablet 1  . telmisartan (MICARDIS) 20 MG tablet Take 1 tablet Daily for BP & Kidney Protection 90 tablet 1  . dapagliflozin propanediol (FARXIGA) 10 MG TABS tablet Take 10 mg by mouth daily. 30 tablet 6  . promethazine-dextromethorphan (PROMETHAZINE-DM) 6.25-15 MG/5ML syrup Take 5 mLs by mouth 4 (four) times daily as needed for cough. 240 mL 1   No current facility-administered medications for this visit.     Allergies  Allergen Reactions  . Ace Inhibitors Cough  . Statins Other (See Comments)    Elevated LFT's    Past Medical History:  Diagnosis Date  . Anemia   . Carotid artery stenosis 01/2017   L-ICA 1-39%, R-ICA 40-59% doppler  . Coronary artery disease    s/p CABG in 2008, w/ LIMA-LAD, SVG-Diag, SVG-OM, SVG-PL  . Diabetes mellitus type II   . Hyperlipidemia   . Hypertension   . Vitamin D deficiency     Blood pressure 122/78, pulse 89, resp. rate 15, height 5\' 1"  (1.549 m), weight 136 lb 12.8 oz (62.1 kg), SpO2 99 %.  DM type 2 (diabetes mellitus, type 2) ([place]) Blood sugars have shown a marked improvement with the addition of [place].  He was given samples x 2 weeks today in the office as well as a coupon for free 30 day trial.  He is encouraged by the drop in blood sugar and hopes to have knee surgery soon.  He was encouraged to continue increasing his vegetable intake and cut back on fruits.    Essential hypertension Blood pressure well controlled on telmisartan 20 mg daily. No changes.  Of note:  At the end of the day his metabolic panel was received and showed potassium to be at 5.9, up from 5.0.  He was notified to hold telmisartan x 3 days, discontinue magnesium and come by the office to pick up 3 doses of Lokelma.  He will take 1 dose twice daily on Wednesday and come to the office early Thursday for a  repeat BMP.  He was told to hold the 3rd dose waiting for those results.    Hyperlipidemia LDL well controlled at 51, with rosuvastatin 20 mg daily.  Although chart allergies indicate statins cause increase in LFTs, his most recent labs show them to be well WNL.     Tommy Medal PharmD CPP [facility] [address] [place], [place] [address] [phone]

## 2018-05-26 NOTE — Patient Instructions (Addendum)
  Go to the lab today for metabolic panel  Your blood pressure today is 122/78  Check your blood pressure at home several times each week and keep record of the readings.  Continue with all other medications. Restart the Farxiga 10 mg  Bring all of your meds, your BP cuff and your record of home blood pressures to your next appointment.  Exercise as you're able, try to walk approximately 30 minutes per day.  Keep salt intake to a minimum, especially watch canned and prepared boxed foods.  Eat more fresh fruits and vegetables and fewer canned items.  Avoid eating in fast food restaurants.    HOW TO TAKE YOUR BLOOD PRESSURE: . Rest 5 minutes before taking your blood pressure. .  Don't smoke or drink caffeinated beverages for at least 30 minutes before. . Take your blood pressure before (not after) you eat. . Sit comfortably with your back supported and both feet on the floor (don't cross your legs). . Elevate your arm to heart level on a table or a desk. . Use the proper sized cuff. It should fit smoothly and snugly around your bare upper arm. There should be enough room to slip a fingertip under the cuff. The bottom edge of the cuff should be 1 inch above the crease of the elbow. . Ideally, take 3 measurements at one sitting and record the average.

## 2018-05-26 NOTE — Telephone Encounter (Signed)
Reviewed with Jory Sims NP - will have patient hold telmisartan x 2-3 days and stop magnesium indefinitely.  Will have him take Lokelma 10 gm bid on Wendesday March 18 and repeat BMET on Thursday March 19.    Patient voiced understanding

## 2018-05-27 ENCOUNTER — Other Ambulatory Visit: Payer: Self-pay

## 2018-05-27 ENCOUNTER — Encounter: Payer: Self-pay | Admitting: Physician Assistant

## 2018-05-27 ENCOUNTER — Ambulatory Visit (INDEPENDENT_AMBULATORY_CARE_PROVIDER_SITE_OTHER): Payer: Medicare Other | Admitting: Physician Assistant

## 2018-05-27 ENCOUNTER — Encounter: Payer: Self-pay | Admitting: Pharmacist Clinician (PhC)/ Clinical Pharmacy Specialist

## 2018-05-27 VITALS — BP 138/76 | HR 83 | Temp 97.8°F | Ht 61.0 in | Wt 137.8 lb

## 2018-05-27 DIAGNOSIS — I6523 Occlusion and stenosis of bilateral carotid arteries: Secondary | ICD-10-CM

## 2018-05-27 DIAGNOSIS — Z79899 Other long term (current) drug therapy: Secondary | ICD-10-CM | POA: Diagnosis not present

## 2018-05-27 DIAGNOSIS — E1169 Type 2 diabetes mellitus with other specified complication: Secondary | ICD-10-CM

## 2018-05-27 DIAGNOSIS — E1142 Type 2 diabetes mellitus with diabetic polyneuropathy: Secondary | ICD-10-CM | POA: Diagnosis not present

## 2018-05-27 DIAGNOSIS — E559 Vitamin D deficiency, unspecified: Secondary | ICD-10-CM

## 2018-05-27 DIAGNOSIS — E663 Overweight: Secondary | ICD-10-CM

## 2018-05-27 DIAGNOSIS — D509 Iron deficiency anemia, unspecified: Secondary | ICD-10-CM

## 2018-05-27 DIAGNOSIS — Z9119 Patient's noncompliance with other medical treatment and regimen: Secondary | ICD-10-CM

## 2018-05-27 DIAGNOSIS — I10 Essential (primary) hypertension: Secondary | ICD-10-CM

## 2018-05-27 DIAGNOSIS — Z91199 Patient's noncompliance with other medical treatment and regimen due to unspecified reason: Secondary | ICD-10-CM

## 2018-05-27 DIAGNOSIS — E782 Mixed hyperlipidemia: Secondary | ICD-10-CM

## 2018-05-27 DIAGNOSIS — E113293 Type 2 diabetes mellitus with mild nonproliferative diabetic retinopathy without macular edema, bilateral: Secondary | ICD-10-CM

## 2018-05-27 DIAGNOSIS — I2510 Atherosclerotic heart disease of native coronary artery without angina pectoris: Secondary | ICD-10-CM

## 2018-05-27 DIAGNOSIS — Z0000 Encounter for general adult medical examination without abnormal findings: Secondary | ICD-10-CM | POA: Diagnosis not present

## 2018-05-27 DIAGNOSIS — E538 Deficiency of other specified B group vitamins: Secondary | ICD-10-CM

## 2018-05-27 NOTE — Assessment & Plan Note (Addendum)
Blood pressure well controlled on telmisartan 20 mg daily. No changes.  Of note:  At the end of the day his metabolic panel was received and showed potassium to be at 5.9, up from 5.0.  He was notified to hold telmisartan x 3 days, discontinue magnesium and come by the office to pick up 3 doses of Lokelma.  He will take 1 dose twice daily on Wednesday and come to the office early Thursday for a repeat BMP.  He was told to hold the 3rd dose waiting for those results.

## 2018-05-27 NOTE — Patient Instructions (Signed)
RANGE OF A1C   Your A1C is a measure of your sugar over the past 3 months and is not affected by what you have eaten over the past few days. Diabetes increases your chances of stroke and heart attack over 300 % and is the leading cause of blindness and kidney failure in the [place]. Please make sure you decrease bad carbs like white bread, white rice, potatoes, corn, soft drinks, pasta, cereals, refined sugars, sweet tea, dried fruits, and fruit juice. Good carbs are okay to eat in moderation like sweet potatoes, brown rice, whole grain pasta/bread, most fruit (except dried fruit) and you can eat as many veggies as you want.   Greater than 6.5 is considered diabetic. Between 6.4 and 5.7 is prediabetic If your A1C is less than 5.7 you are NOT diabetic.  Targets for Glucose Readings: Time of Check Target for patients WITHOUT Diabetes Target for DIABETICS  Before Meals Less than 100  less than 150  Two hours after meals Less than 200  Less than 250   STARTING A DIABETIC MEDICATION  The medication we are going to start you on is a sodium-glucose cotransporter-2 (SGLT2) inhibitors for your diabetes and weight.   This can decrease your blood pressure so please contact us if you have any dizziness, sometime we need to decrease or stop fluid pills or decrease BP meds.  You are peeing out 300-400 calories a day of sugar which can lead to yeast infections, you can take the diflucan as needed but if the infections continue we will stop the medications.  If you get any pain or discomfort around your buttocks or genitals please let us know if it does not go away. We will have you follow up in 1 month to check your kidney function and weight. Call if you need anything.   Please be careful with glimepiride (Amaryl). This medication forces your blood sugar down no matter what it is starting at. Only take 1/2 of the medication if your sugar is above 120 and you can take a whole pill if your sugar is above  150 in the morning. If at any time you start to have low blood sugars in the morning or during the day please stop this medication. Please never take this medication if you are sick or can not eat. A low blood sugar is much more dangerous than a high blood sugar. Your brain needs two things, sugar and oxygen.   Diabetes is a very complicated disease...lets simplify it.  An easy way to look at it to understand the complications is if you think of the extra sugar floating in your blood stream as glass shards floating through your blood stream.    Diabetes affects your small vessels first: 1) The glass shards (sugar) scraps down the tiny blood vessels in your eyes and lead to diabetic retinopathy, the leading cause of blindness in the [place]. Diabetes is the leading cause of newly diagnosed adult (2 to 70 years of age) blindness in the [place].  2) The glass shards scratches down the tiny vessels of your legs leading to nerve damage called neuropathy and can lead to amputations of your feet. More than 60% of all non-traumatic amputations of lower limbs occur in people with diabetes.  3) Over time the small vessels in your brain are shredded and closed off, individually this does not cause any problems but over a long period of time many of the small vessels being blocked can lead  to Vascular Dementia.   4) Your kidney's are a filter system and have a "net" that keeps certain things in the body and lets bad things out. Sugar shreds this net and leads to kidney damage and eventually failure. Decreasing the sugar that is destroying the net and certain blood pressure medications can help stop or decrease progression of kidney disease. Diabetes was the primary cause of kidney failure in 44 percent of all new cases in 2011.  5) Diabetes also destroys the small vessels in your penis that lead to erectile dysfunction. Eventually the vessels are so damaged that you may not be responsive to cialis or viagra.    Diabetes and your large vessels: Your larger vessels consist of your coronary arteries in your heart and the carotid vessels to your brain. Diabetes or even increased sugars put you at 300% increased risk of heart attack and stroke and this is why.. The sugar scrapes down your large blood vessels and your body sees this as an internal injury and tries to repair itself. Just like you get a scab on your skin, your platelets will stick to the blood vessel wall trying to heal it. This is why we have diabetics on low dose aspirin daily, this prevents the platelets from sticking and can prevent plaque formation. In addition, your body takes cholesterol and tries to shove it into the open wound. This is why we want your LDL, or bad cholesterol, below 70.   The combination of platelets and cholesterol over 5-10 years forms plaque that can break off and cause a heart attack or stroke.   PLEASE REMEMBER:  Diabetes is preventable! Up to 56 percent of complications and morbidities among individuals with type 2 diabetes can be prevented, delayed, or effectively treated and minimized with regular visits to a health professional, appropriate monitoring and medication, and a healthy diet and lifestyle.     Bad carbs also include fruit juice, alcohol, and sweet tea. These are empty calories that do not signal to your brain that you are full.   Please remember the good carbs are still carbs which convert into sugar. So please measure them out no more than 1/2-1 cup of rice, oatmeal, pasta, and beans  Veggies are however free foods! Pile them on.   Not all fruit is created equal. Please see the list below, the fruit at the bottom is higher in sugars than the fruit at the top. Please avoid all dried fruits.

## 2018-05-27 NOTE — Assessment & Plan Note (Signed)
Blood sugars have shown a marked improvement with the addition of [place].  He was given samples x 2 weeks today in the office as well as a coupon for free 30 day trial.  He is encouraged by the drop in blood sugar and hopes to have knee surgery soon.  He was encouraged to continue increasing his vegetable intake and cut back on fruits.

## 2018-05-27 NOTE — Assessment & Plan Note (Signed)
LDL well controlled at 51, with rosuvastatin 20 mg daily.  Although chart allergies indicate statins cause increase in LFTs, his most recent labs show them to be well WNL.

## 2018-05-28 LAB — URINALYSIS, ROUTINE W REFLEX MICROSCOPIC
Bilirubin Urine: NEGATIVE
Hgb urine dipstick: NEGATIVE
Ketones, ur: NEGATIVE
Leukocytes,Ua: NEGATIVE
Nitrite: NEGATIVE
Protein, ur: NEGATIVE
Specific Gravity, Urine: 1.02 (ref 1.001–1.03)
pH: 6.5 (ref 5.0–8.0)

## 2018-05-28 LAB — COMPLETE METABOLIC PANEL WITH GFR
AG Ratio: 1.8 (calc) (ref 1.0–2.5)
ALKALINE PHOSPHATASE (APISO): 52 U/L (ref 35–144)
ALT: 19 U/L (ref 9–46)
AST: 17 U/L (ref 10–35)
Albumin: 4.2 g/dL (ref 3.6–5.1)
BILIRUBIN TOTAL: 0.6 mg/dL (ref 0.2–1.2)
BUN: 14 mg/dL (ref 7–25)
CHLORIDE: 102 mmol/L (ref 98–110)
CO2: 28 mmol/L (ref 20–32)
Calcium: 9.5 mg/dL (ref 8.6–10.3)
Creat: 0.88 mg/dL (ref 0.70–1.25)
GFR, Est African American: 102 mL/min/{1.73_m2} (ref >=60)
GFR, Est Non African American: 88 mL/min/{1.73_m2} (ref >=60)
Globulin: 2.3 g/dL (calc) (ref 1.9–3.7)
Glucose, Bld: 157 mg/dL — ABNORMAL HIGH (ref 65–99)
Potassium: 5.7 mmol/L — ABNORMAL HIGH (ref 3.5–5.3)
Sodium: 135 mmol/L (ref 135–146)
Total Protein: 6.5 g/dL (ref 6.1–8.1)

## 2018-05-28 LAB — CBC MORPHOLOGY

## 2018-05-28 LAB — VITAMIN B12: VITAMIN B 12: 262 pg/mL (ref 200–1100)

## 2018-05-28 LAB — CBC WITH DIFFERENTIAL/PLATELET
Absolute Monocytes: 624 cells/uL (ref 200–950)
BASOS ABS: 69 {cells}/uL (ref 0–200)
Basophils Relative: 1.1 %
EOS ABS: 466 {cells}/uL (ref 15–500)
Eosinophils Relative: 7.4 %
HCT: 37.9 % — ABNORMAL LOW (ref 38.5–50.0)
Hemoglobin: 11.1 g/dL — ABNORMAL LOW (ref 13.2–17.1)
Lymphs Abs: 1241 cells/uL (ref 850–3900)
MCH: 20.3 pg — AB (ref 27.0–33.0)
MCHC: 29.3 g/dL — AB (ref 32.0–36.0)
MCV: 69.2 fL — ABNORMAL LOW (ref 80.0–100.0)
MPV: 11.3 fL (ref 7.5–12.5)
Monocytes Relative: 9.9 %
Neutro Abs: 3900 cells/uL (ref 1500–7800)
Neutrophils Relative %: 61.9 %
Platelets: 398 10*3/uL (ref 140–400)
RBC: 5.48 10*6/uL (ref 4.20–5.80)
RDW: 17.3 % — ABNORMAL HIGH (ref 11.0–15.0)
Total Lymphocyte: 19.7 %
WBC: 6.3 10*3/uL (ref 3.8–10.8)

## 2018-05-28 LAB — LIPID PANEL
CHOLESTEROL: 92 mg/dL (ref <200)
HDL: 33 mg/dL — AB (ref >=40)
LDL Cholesterol (Calc): 43 mg/dL (calc)
Non-HDL Cholesterol (Calc): 59 mg/dL (calc) (ref <130)
Total CHOL/HDL Ratio: 2.8 (calc) (ref <5.0)
Triglycerides: 76 mg/dL (ref <150)

## 2018-05-28 LAB — MICROALBUMIN / CREATININE URINE RATIO
Creatinine, Urine: 102 mg/dL (ref 20–320)
MICROALB/CREAT RATIO: 10 ug/mg{creat} (ref <30)
Microalb, Ur: 1 mg/dL

## 2018-05-28 LAB — HEMOGLOBIN A1C
Hgb A1c MFr Bld: 9.7 % of total Hgb — ABNORMAL HIGH (ref <5.7)
Mean Plasma Glucose: 232 (calc)
eAG (mmol/L): 12.8 (calc)

## 2018-05-28 LAB — IRON, TOTAL/TOTAL IRON BINDING CAP
%SAT: 24 % (ref 20–48)
Iron: 87 ug/dL (ref 50–180)
TIBC: 360 mcg/dL (calc) (ref 250–425)

## 2018-05-28 LAB — MAGNESIUM: MAGNESIUM: 1.8 mg/dL (ref 1.5–2.5)

## 2018-05-28 LAB — TSH: TSH: 2.33 mIU/L (ref 0.40–4.50)

## 2018-05-28 LAB — URIC ACID: Uric Acid, Serum: 5 mg/dL (ref 4.0–8.0)

## 2018-05-29 DIAGNOSIS — I10 Essential (primary) hypertension: Secondary | ICD-10-CM | POA: Diagnosis not present

## 2018-05-30 LAB — BASIC METABOLIC PANEL
BUN / CREAT RATIO: 16 (ref 10–24)
BUN: 18 mg/dL (ref 8–27)
CO2: 24 mmol/L (ref 20–29)
Calcium: 9.4 mg/dL (ref 8.6–10.2)
Chloride: 97 mmol/L (ref 96–106)
Creatinine, Ser: 1.11 mg/dL (ref 0.76–1.27)
GFR calc non Af Amer: 67 mL/min/{1.73_m2} (ref >59)
GFR, EST AFRICAN AMERICAN: 78 mL/min/{1.73_m2} (ref >59)
Glucose: 299 mg/dL — ABNORMAL HIGH (ref 65–99)
Potassium: 5 mmol/L (ref 3.5–5.2)
Sodium: 136 mmol/L (ref 134–144)

## 2018-05-31 ENCOUNTER — Other Ambulatory Visit: Payer: Self-pay | Admitting: Internal Medicine

## 2018-06-01 IMAGING — DX DG CHEST 1V PORT
1 series · 1 of 1 positions shown · non-contrast
Comparison: 01/28/2013

CLINICAL DATA: History of coronary artery disease and diabetes.

EXAM:
PORTABLE CHEST 1 VIEW

[chest ap]
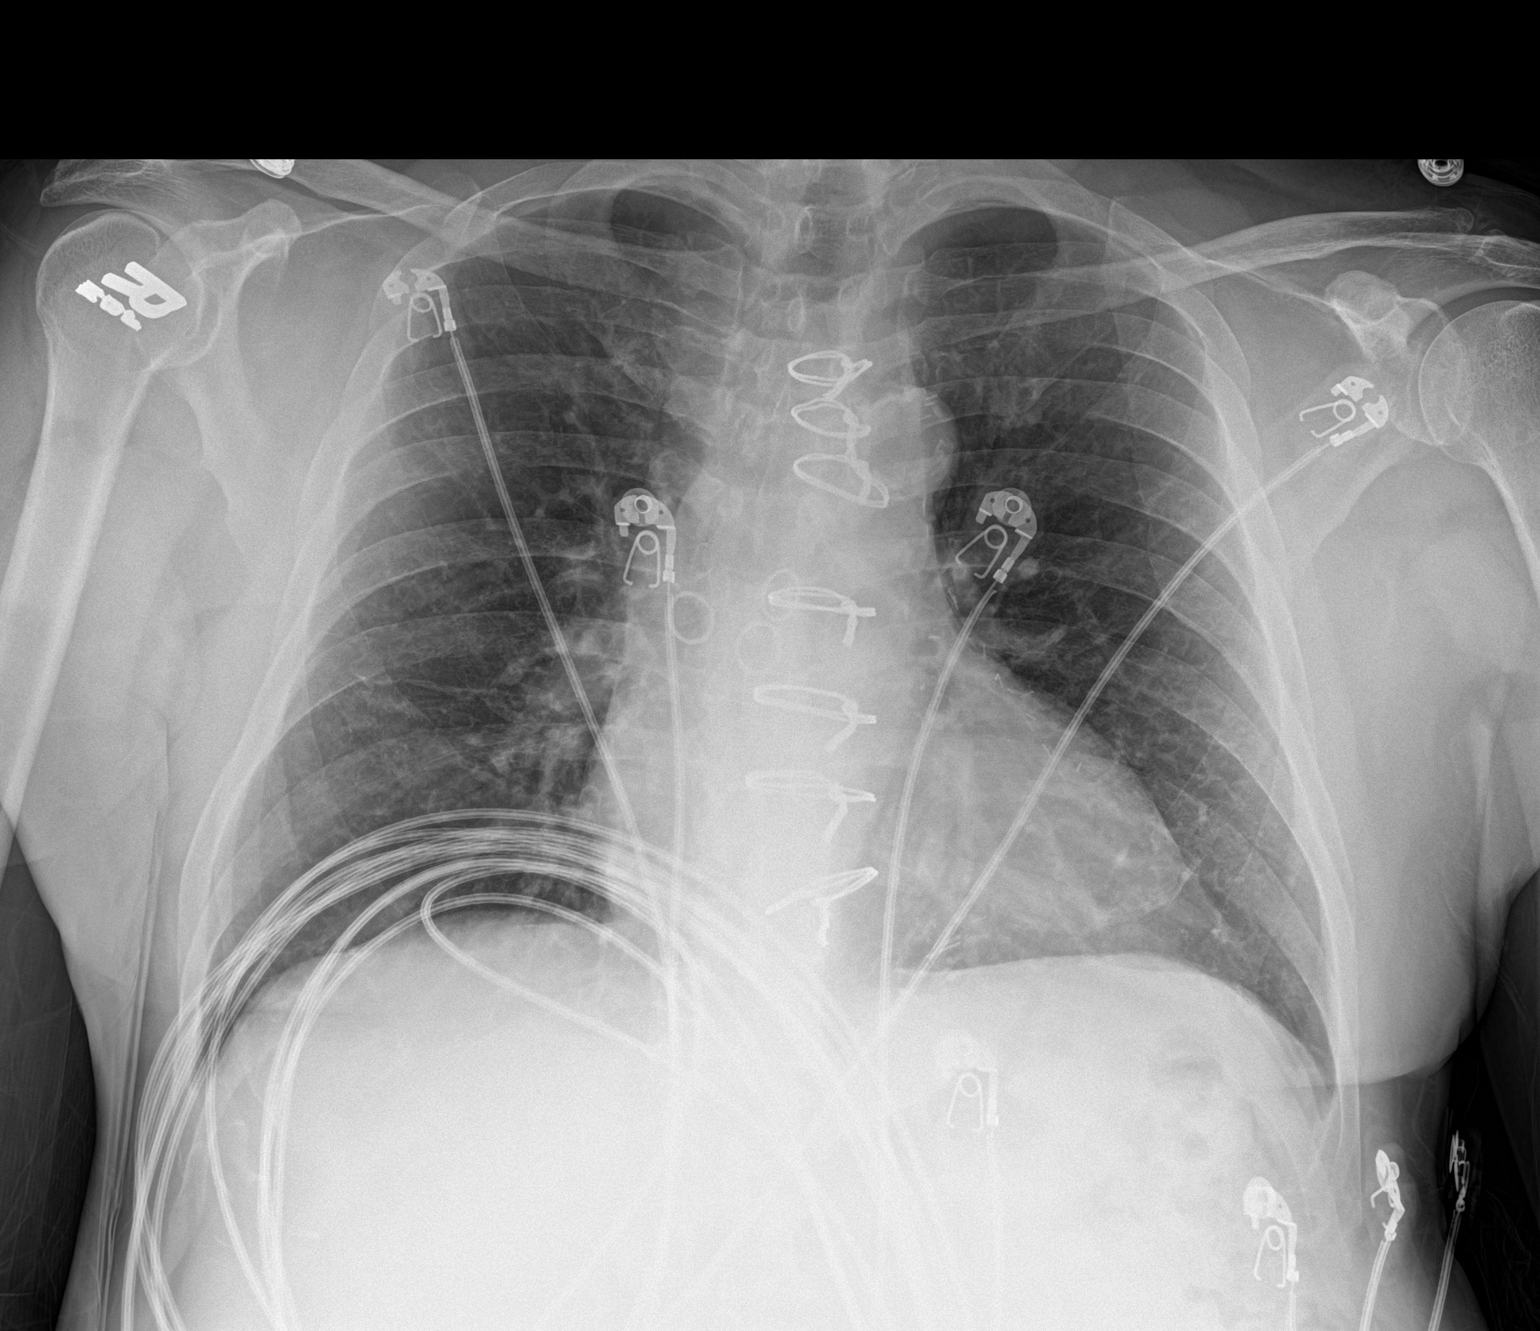

[1 of 1 positions shown; findings below may reference images not displayed]

FINDINGS: Postoperative changes in the mediastinum. Normal heart size and
pulmonary vascularity. No focal airspace disease or consolidation in
the lungs. No blunting of costophrenic angles. No pneumothorax.
Mediastinal contours appear intact. Calcification of the aorta.
IMPRESSION: No evidence of active pulmonary disease.  Aortic atherosclerosis.

## 2018-06-02 ENCOUNTER — Encounter: Payer: Self-pay | Admitting: *Deleted

## 2018-06-09 ENCOUNTER — Inpatient Hospital Stay (HOSPITAL_COMMUNITY): Admission: RE | Admit: 2018-06-09 | Payer: Medicare Other | Source: Ambulatory Visit

## 2018-06-11 ENCOUNTER — Other Ambulatory Visit: Payer: Self-pay

## 2018-06-11 NOTE — Patient Outreach (Signed)
[facility]) Care Management  06/11/2018  Harrold Fitchett 09-23-1950 [id number]   Medication Adherence call to Mr. Alric Geise spoke with patient he is due on Telmisartan 20 mg patient sai he does not need any at this time Mr. Seitzinger is showing past due under [place].   [facility] Management Direct Dial [phone]  Fax [phone] [email or web address]

## 2018-07-06 ENCOUNTER — Telehealth: Payer: Self-pay | Admitting: Physician Assistant

## 2018-07-06 ENCOUNTER — Other Ambulatory Visit: Payer: Self-pay | Admitting: Adult Health

## 2018-07-06 DIAGNOSIS — Z961 Presence of intraocular lens: Secondary | ICD-10-CM | POA: Diagnosis not present

## 2018-07-06 DIAGNOSIS — E113213 Type 2 diabetes mellitus with mild nonproliferative diabetic retinopathy with macular edema, bilateral: Secondary | ICD-10-CM | POA: Diagnosis not present

## 2018-07-06 LAB — HM DIABETES EYE EXAM

## 2018-07-06 MED ORDER — DAPAGLIFLOZIN PROPANEDIOL 10 MG PO TABS: 10.0000 mg | ORAL_TABLET | Freq: Every day | ORAL | 6 refills | Status: DC

## 2018-07-06 NOTE — Telephone Encounter (Signed)
-----   Message from [name], [place] sent at 07/06/2018 12:47 PM EDT ----- Regarding: med request Contact: [phone] The samples of [place] have been working very well. He only has a couple of the samples left & would like you to call in some into his pharmacy.  Pharmacy-[facility]

## 2018-07-09 ENCOUNTER — Telehealth: Payer: Self-pay

## 2018-07-09 ENCOUNTER — Other Ambulatory Visit: Payer: Self-pay | Admitting: Adult Health

## 2018-07-09 DIAGNOSIS — E782 Mixed hyperlipidemia: Secondary | ICD-10-CM

## 2018-07-09 MED ORDER — EMPAGLIFLOZIN 10 MG PO TABS: 10.0000 mg | ORAL_TABLET | Freq: Every day | ORAL | 12 refills | Status: DC

## 2018-07-09 NOTE — Telephone Encounter (Signed)
Insurance preference for [employer].  Will prescribe Jardiance 10 mg qd.

## 2018-07-10 ENCOUNTER — Encounter (HOSPITAL_COMMUNITY): Payer: Medicare Other

## 2018-07-20 ENCOUNTER — Other Ambulatory Visit: Payer: Self-pay

## 2018-07-20 ENCOUNTER — Encounter: Payer: Self-pay | Admitting: Physician Assistant

## 2018-07-20 ENCOUNTER — Ambulatory Visit: Payer: Medicare Other | Admitting: Physician Assistant

## 2018-07-20 VITALS — BP 118/70 | Temp 97.2°F | Ht 61.0 in | Wt 131.0 lb

## 2018-07-20 DIAGNOSIS — J028 Acute pharyngitis due to other specified organisms: Secondary | ICD-10-CM

## 2018-07-20 MED ORDER — AZITHROMYCIN 250 MG PO TABS: ORAL_TABLET | ORAL | 1 refills | Status: AC

## 2018-07-20 NOTE — Progress Notes (Signed)
THIS ENCOUNTER IS A VIRTUAL VISIT DUE TO COVID-19 - PATIENT WAS NOT SEEN IN THE OFFICE.  PATIENT HAS CONSENTED TO VIRTUAL VISIT / TELEMEDICINE VISIT   Virtual Visit via telephone Note  I connected with William Haney on 07/20/2018  by telephone.  I verified that I am speaking with the correct person using two identifiers.    I discussed the limitations of evaluation and management by telemedicine and the availability of in person appointments. The patient expressed understanding and agreed to proceed.  History of Present Illness: 68 y.o. M calls with sore throat x 7-10 days.  He states just feel like needles in his throat, no fever, chills, cough, HA.  No muscle aches, loss of smell.  No sinus drainage. Some GERD symptoms.   He states he sugars are doing well.   Medications  Current Outpatient Medications (Endocrine & Metabolic):  .  empagliflozin (JARDIANCE) 10 MG TABS tablet, Take 10 mg by mouth daily. [name]  glimepiride (AMARYL) 4 MG tablet, Take 1 tablet 2 x / day for Diabetes .  metFORMIN (GLUCOPHAGE) 500 MG tablet, TAKE 2 TABLETS BY MOUTH EVERY MORNING AND 2 EVERY EVENING  Current Outpatient Medications (Cardiovascular):  .  metoprolol tartrate (LOPRESSOR) 25 MG tablet, TAKE 1 TABLET BY MOUTH TWICE A DAY .  rosuvastatin (CRESTOR) 20 MG tablet, TAKE 1 TABLET BY MOUTH EVERY DAY   Current Outpatient Medications (Analgesics):  .  aspirin 81 MG tablet, Take 81 mg by mouth daily.   Current Outpatient Medications (Other):  [name]  Cholecalciferol (VITAMIN D3 PO), Take 5,000 Units by mouth.  .  Cinnamon 500 MG capsule, Take 2 capsules (1,000 mg total) by mouth 2 (two) times daily. .  Magnesium 250 MG TABS, Take by mouth daily. .  Multiple Vitamin (MULTIVITAMIN) tablet, Take 1 tablet by mouth daily. [name]  azithromycin (ZITHROMAX) 250 MG tablet, Take 2 tablets (500 mg) on  Day 1,  followed by 1 tablet (250 mg) once daily on Days 2 through 5.  Problem list He has Hyperlipidemia; Coronary  atherosclerosis; Carotid stenosis; Essential hypertension; Vitamin D deficiency; Medication management; Encounter for Medicare annual wellness exam; Neuropathy, diabetic ([place]); DM type 2 (diabetes mellitus, type 2) ([place]); Poor compliance; Overweight (BMI 25.0-29.9); Diabetic retinopathy of both eyes ([place]); and beta thalassemia minor (trait) anemia on their problem list.   Observations/Objective: General Appearance:Well sounding, in no apparent distress.  ENT/Mouth: No hoarseness, No cough for duration of visit.  Respiratory: completing full sentences without distress, without audible wheeze Neuro: Awake and oriented X 3,  Psych:  Insight and Judgment appropriate.    Assessment and Plan: William Haney was seen today for acute visit and sore throat.  Diagnoses and all orders for this visit:  Pharyngitis due to other organism -     azithromycin (ZITHROMAX) 250 MG tablet; Take 2 tablets (500 mg) on  Day 1,  followed by 1 tablet (250 mg) once daily on Days 2 through 5.   Get on allergy pill, get on stomach medication like pepcid  The patient was advised to call immediately if he has any concerning symptoms in the interval. The patient voices understanding of current treatment options and is in agreement with the current care plan.The patient knows to call the clinic with any problems, questions or concerns or go to the ER if any further progression of symptoms.   Follow Up Instructions:  I discussed the assessment and treatment plan with the patient. The patient was provided an opportunity to ask questions and  all were answered. The patient agreed with the plan and demonstrated an understanding of the instructions.   The patient was advised to call back or seek an in-person evaluation if the symptoms worsen or if the condition fails to improve as anticipated.  I provided 15 minutes of non-face-to-face time during this encounter.   [name], PA-C

## 2018-07-20 NOTE — Patient Instructions (Signed)
-Make sure you are drinking plenty of fluids to stay hydrated.  -while drinking fluids pinch and hold nose close and swallow, to help open eustachian tubes and drain fluid behind ear drums. -you can do salt water gargles. You can also do 1 TSP liquid Maalox and 1 TSP liquid benadryl- mix/ gargle/ spit  If you are not feeling better in 10-14 days, then please call the office.  Pharyngitis Pharyngitis is redness, pain, and swelling (inflammation) of your pharynx.  CAUSES  Pharyngitis is usually caused by infection. Most of the time, these infections are from viruses (viral) and are part of a cold. However, sometimes pharyngitis is caused by bacteria (bacterial). Pharyngitis can also be caused by allergies. Viral pharyngitis may be spread from person to person by coughing, sneezing, and personal items or utensils (cups, forks, spoons, toothbrushes). Bacterial pharyngitis may be spread from person to person by more intimate contact, such as kissing.  SIGNS AND SYMPTOMS  Symptoms of pharyngitis include:   Sore throat.   Tiredness (fatigue).   Low-grade fever.   Headache.  Joint pain and muscle aches.  Skin rashes.  Swollen lymph nodes.  Plaque-like film on throat or tonsils (often seen with bacterial pharyngitis). DIAGNOSIS  Your health care provider will ask you questions about your illness and your symptoms. Your medical history, along with a physical exam, is often all that is needed to diagnose pharyngitis. Sometimes, a rapid strep test is done. Other lab tests may also be done, depending on the suspected cause.  TREATMENT  Viral pharyngitis will usually get better in 3-4 days without the use of medicine. Bacterial pharyngitis is treated with medicines that kill germs (antibiotics).  HOME CARE INSTRUCTIONS   Drink enough water and fluids to keep your urine clear or pale yellow.   Only take over-the-counter or prescription medicines as directed by your health care  provider:   If you are prescribed antibiotics, make sure you finish them even if you start to feel better.   Do not take aspirin.   Get lots of rest.   Gargle with 8 oz of salt water ( tsp of salt per 1 qt of water) as often as every 1-2 hours to soothe your throat.   Throat lozenges (if you are not at risk for choking) or sprays may be used to soothe your throat. SEEK MEDICAL CARE IF:   You have large, tender lumps in your neck.  You have a rash.  You cough up green, yellow-brown, or bloody spit. SEEK IMMEDIATE MEDICAL CARE IF:   Your neck becomes stiff.  You drool or are unable to swallow liquids.  You vomit or are unable to keep medicines or liquids down.  You have severe pain that does not go away with the use of recommended medicines.  You have trouble breathing (not caused by a stuffy nose). MAKE SURE YOU:   Understand these instructions.  Will watch your condition.  Will get help right away if you are not doing well or get worse. Document Released: 02/25/2005 Document Revised: 12/16/2012 Document Reviewed: 11/02/2012 [facility] Patient Information 2015 [place], [place]. This information is not intended to replace advice given to you by your health care provider. Make sure you discuss any questions you have with your health care provider.  Generally a cough is either coming from above or from below- so we will treat this OR it can be from irritation/viral cough  To treat the nasal drip: Get on the chlorphenirmine every 6 hours- This medication  can make you sleepy but helps with nasal drip- get from over the counter.   Can do a steroid nasal spary 1-2 sparys at night each nostril. Remember to spray each nostril twice towards the outer part of your eye.  Do not sniff but instead pinch your nose and tilt your head back to help the medicine get into your sinuses.  The best time to do this is at bedtime. Stop if you get blurred vision or nose bleeds.   To treat  the reflux Will send in prilosec 40 mg to take once in the morning and take prevacid from over the counter at night for 2 weeks- then stop the prilosec and continue the pravacid or famotadine  To stop irritation: Need to STOP the cough Do sugar free candy Do the tessalon drops VOICE REST is VERY important  If not better in 2 weeks will refer to ENT  Go to the ER or call the office if you get any chest pain, shortness of breath, severe  headache, leg swelling.    Common causes of cough OR hoarseness OR sore throat:   Allergies, Viral Infections, Acid Reflux and Bacterial Infections.    Allergies and viral infections cause a cough OR sore throat by post nasal drip and are often worse at night, can also have sneezing, lower grade fevers, clear/yellow mucus. This is best treated with allergy medications or nasal sprays.  Please get on allegra for 1-2 weeks The strongest is allegra or fexafinadine  Cheapest at [employer], [employer], [employer]   Bacterial infections are more severe than allergies or viral infections with fever, teeth pain, fatigue. This can be treated with prednisone and the same over the counter medication and after 7 days can be treated with an antibiotic.   Silent reflux/GERD can cause a cough OR sore throat OR hoarseness WITHOUT heart burn because the esophagus that goes to the stomach and trachea that goes to the lungs are very close and when you lay down the acid can irritate your throat and lungs. This can cause hoarseness, cough, and wheezing. Please stop any alcohol or anti-inflammatories like aleve/advil/ibuprofen and start an over the counter Prilosec or omeprazole 1-2 times daily 58mins before food for 2 weeks, then switch to over the counter zantac/ratinidine or pepcid/famotadine once at night for 2 weeks.    sometimes irritation causes more irritation. Try voice rest, use sugar free cough drops to prevent coughing, and try to stop clearing your throat.   If you ever have a  cough that does not go away after trying these things please make a follow up visit for further evaluation or we can refer you to a specialist. Or if you ever have shortness of breath or chest pain go to the ER.    Silent reflux: Not all heartburn burns...Marland KitchenMarland KitchenMarland Kitchen  What is LPR? Laryngopharyngeal reflux (LPR) or silent reflux is a condition in which acid that is made in the stomach travels up the esophagus (swallowing tube) and gets to the throat. Not everyone with reflux has a lot of heartburn or indigestion. In fact, many people with LPR never have heartburn. This is why LPR is called SILENT REFLUX, and the terms "Silent reflux" and "LPR" are often used interchangeably. Because LPR is silent, it is sometimes difficult to diagnose.  How can you tell if you have LPR?  [name] Chronic hoarseness- Some people have hoarseness that comes and goes . throat clearing  . Cough . It can cause shortness of breath and  cause asthma like symptoms. [name] a feeling of a lump in the throat  . difficulty swallowing . a problem with too much nose and throat drainage.  . Some people will feel their esophagus spasm which feels like their heart beating hard and fast, this will usually be after a meal, at rest, or lying down at night.    How do I treat this? Treatment for LPR should be individualized, and your doctor will suggest the best treatment for you. Generally there are several treatments for LPR: . changing habits and diet to reduce reflux,  . medications to reduce stomach acid, and  . surgery to prevent reflux. Most people with LPR need to modify how and when they eat, as well as take some medication, to get well. Sometimes, nonprescription liquid antacids, such as Maalox, Gelucil and Mylanta are recommended. When used, these antacids should be taken four times each day - one tablespoon one hour after each meal and before bedtime. Dietary and lifestyle changes alone are not often enough to control LPR - medications  that reduce stomach acid are also usually needed. These must be prescribed by our doctor.   TIPS FOR REDUCING REFLUX AND LPR Control your LIFE-STYLE and your DIET! [name] If you use tobacco, QUIT.  [name] Smoking makes you reflux. After every cigarette you have some LPR.  . Don't wear clothing that is too tight, especially around the waist (trousers, corsets, belts).  . Do not lie down just after eating...in fact, do not eat within three hours of bedtime.  . You should be on a low-fat diet.  . Limit your intake of red meat.  . Limit your intake of butter.  [name] Avoid fried foods.  . Avoid chocolate  . Avoid cheese.  [name] Avoid eggs. [name] Specifically avoid caffeine (especially coffee and tea), soda pop (especially cola) and mints.  . Avoid alcoholic beverages, particularly in the evening.

## 2018-08-03 ENCOUNTER — Other Ambulatory Visit: Payer: Self-pay | Admitting: Adult Health

## 2018-08-11 ENCOUNTER — Telehealth: Payer: Self-pay

## 2018-08-11 ENCOUNTER — Other Ambulatory Visit: Payer: Self-pay | Admitting: Orthopedic Surgery

## 2018-08-11 NOTE — Telephone Encounter (Signed)
   [facility] Pre-operative Risk Assessment    Request for surgical clearance:  1. What type of surgery is being performed? Left total knee replacement   2. When is this surgery scheduled? TBD   3. What type of clearance is required (medical clearance vs. Pharmacy clearance to hold med vs. Both)? Medical   4. Are there any medications that need to be held prior to surgery and how long? Aspirin   5. Practice name and name of physician performing surgery?  [facility], Lowella Petties, MD   6. What is your office phone number [phone]    7.   What is your office fax number [phone] [place]  8.   Anesthesia type (None, local, MAC, general) ? Not listed   Jacqulynn Cadet 08/11/2018, 4:08 PM  _________________________________________________________________   (provider comments below)

## 2018-08-11 NOTE — Telephone Encounter (Signed)
   Primary Cardiologist: Minus Breeding, MD  Chart reviewed as part of pre-operative protocol coverage. Patient was contacted 08/11/2018 in reference to pre-operative risk assessment for pending surgery as outlined below.  William Haney was last seen on 05/07/18 by Dr. Percival Spanish and 3/18 by Tommy Medal. The patient has a history of CAD s/p CABG, carotid disease, DM, HTN, HLD, Vit D deficiency. He is pending updated carotid study on 6/5.  Will tentatively route to Dr. Percival Spanish for guidelines on holding ASA for knee surgery, then patient will need call. Dr. Percival Spanish - Please route response to P CV DIV PREOP (the pre-op pool). Thank you.  [name], PA-C 08/11/2018, 5:23 PM

## 2018-08-12 ENCOUNTER — Telehealth: Payer: Self-pay | Admitting: *Deleted

## 2018-08-12 NOTE — Telephone Encounter (Signed)
LVM to call [phone] for Coordinate DM  3 month follow up visit.

## 2018-08-14 ENCOUNTER — Other Ambulatory Visit (HOSPITAL_COMMUNITY): Payer: Self-pay | Admitting: Internal Medicine

## 2018-08-14 ENCOUNTER — Ambulatory Visit (HOSPITAL_COMMUNITY)
Admission: RE | Admit: 2018-08-14 | Discharge: 2018-08-14 | Disposition: A | Discharge status: Home / Self Care | Payer: Medicare Other | Source: Ambulatory Visit | Attending: Cardiovascular Disease | Admitting: Cardiovascular Disease

## 2018-08-14 ENCOUNTER — Other Ambulatory Visit: Payer: Self-pay

## 2018-08-14 DIAGNOSIS — I6523 Occlusion and stenosis of bilateral carotid arteries: Secondary | ICD-10-CM

## 2018-08-14 NOTE — Telephone Encounter (Signed)
We would typically not hold ASA for this surgery.  However, OK to hold for 5 days if the bleeding risk from surgery is prohibitive.

## 2018-08-14 NOTE — Telephone Encounter (Signed)
LMTCB  Aqsa Sensabaugh PA-C 08/14/2018 4:00 PM

## 2018-08-14 NOTE — Telephone Encounter (Signed)
   Primary Cardiologist: Minus Breeding, MD  Chart reviewed as part of pre-operative protocol coverage. Given past medical history and time since last visit, based on ACC/AHA guidelines, William Haney would be at acceptable risk for the planned procedure without further cardiovascular testing.   OK to hold aspirin 3-5 days pre op if needed.  I will route this recommendation to the requesting party via Epic fax function and remove from pre-op pool.  Please call with questions.  Kerin Ransom, PA-C 08/14/2018, 4:27 PM

## 2018-08-17 NOTE — Progress Notes (Signed)
Assessment and Plan  Surgical counseling visit We will send letter to the surgeon for surgical clearance after his A1C is below 7.5   Recommendations:  DVT prophylaxis, monitoring of blood sugars and blood pressure post operatively. Patient will schedule an appointment in the office post operatively for follow up.    Atherosclerosis of native coronary artery of native heart without angina pectoris -     Lipid panel CONTINUE CARDIOLOGY FOLLOW UP HAS HAD CARDIOLOGY CLEARANCE  Essential hypertension -     CBC with Differential/Platelet -     COMPLETE METABOLIC PANEL WITH GFR -     TSH - continue medications, DASH diet, exercise and monitor at home. Call if greater than 130/80.   Type 2 diabetes mellitus with other specified complication, without long-term current use of insulin (HCC) LAST A1C TOO ELEVATED, WILL CHECK THIS VISIT, NEEDS TO BE BELOW 7.5 Discussed general issues about diabetes pathophysiology and management., Educational material distributed., Suggested low cholesterol diet., Encouraged aerobic exercise., Discussed foot care., Reminded to get yearly retinal exam. -     empagliflozin (JARDIANCE) 25 MG TABS tablet; Take 25 mg by mouth daily. -     Hemoglobin A1c  Hyperlipidemia check lipids decrease fatty foods increase activity.   Medication management -     Magnesium     Future Appointments  Date Time Provider [place]  09/03/2018  3:30 PM Unk Pinto, MD [facility] None  11/09/2018  3:45 PM Liane Comber, NP [facility] None  05/31/2019  9:00 AM [name], PA-C [facility] None    HPI 68 y.o. male  has a past medical history of Anemia, Carotid artery stenosis (01/2017), Coronary artery disease, Diabetes mellitus type II, Hyperlipidemia, Hypertension, and Vitamin D deficiency. presents to the office for surgical clearance for total knee arthroplasty with Dr. Berenice Primas. Has been surgically cleared by his cardiologist Dr. Percival Spanish.   Patient denies  chest pain, shortness of breath. Last A1C was 9.7 which is not acceptable for preop. He is on amaryl 4 mg BID, and metformin 2000mg  a day. He is unable to get on farxiga but his insurance will cover jardiance, we will switch him.  His sugars have been 120-140 in the AM.   Lab Results  Component Value Date   HGBA1C 9.7 (H) 05/27/2018   Lab Results  Component Value Date   GFRNONAA 67 05/29/2018   BMI is Body mass index is 25.77 kg/m., he is working on diet and exercise. Wt Readings from Last 3 Encounters:  08/19/18 136 lb 6.4 oz (61.9 kg)  07/20/18 131 lb (59.4 kg)  05/27/18 137 lb 12.8 oz (62.5 kg)    Past Medical History:  Diagnosis Date  . Anemia   . Carotid artery stenosis 01/2017   L-ICA 1-39%, R-ICA 40-59% doppler  . Coronary artery disease    s/p CABG in 2008, w/ LIMA-LAD, SVG-Diag, SVG-OM, SVG-PL  . Diabetes mellitus type II   . Hyperlipidemia   . Hypertension   . Vitamin D deficiency     Current Outpatient Medications on File Prior to Visit  Medication Sig Dispense Refill  . aspirin 81 MG tablet Take 81 mg by mouth daily.    . Cholecalciferol (VITAMIN D3 PO) Take 5,000 Units by mouth.     . Cinnamon 500 MG capsule Take 2 capsules (1,000 mg total) by mouth 2 (two) times daily. 60 capsule 3  . glimepiride (AMARYL) 4 MG tablet Take 1 tablet 2 x / day for Diabetes 180 tablet 1  . Magnesium  250 MG TABS Take by mouth daily.    . metFORMIN (GLUCOPHAGE) 500 MG tablet TAKE 2 TABLETS BY MOUTH EVERY MORNING AND 2 EVERY EVENING 360 tablet 2  . metoprolol tartrate (LOPRESSOR) 25 MG tablet TAKE 1 TABLET BY MOUTH TWICE A DAY 180 tablet 1  . Multiple Vitamin (MULTIVITAMIN) tablet Take 1 tablet by mouth daily.    . rosuvastatin (CRESTOR) 20 MG tablet TAKE 1 TABLET BY MOUTH EVERY DAY 90 tablet 1   No current facility-administered medications on file prior to visit.     Review of Systems  Constitutional: Negative.   HENT: Negative.   Eyes: Negative.   Respiratory: Negative.    Cardiovascular: Negative.   Gastrointestinal: Negative.   Genitourinary: Negative.   Musculoskeletal: Positive for joint pain. Negative for back pain, falls, myalgias and neck pain.  Skin: Negative.      Allergies  Allergen Reactions  . Ace Inhibitors Cough  . Statins Other (See Comments)    Elevated LFT's    Past Surgical History:  Procedure Laterality Date  . CORONARY ARTERY BYPASS GRAFT  2008   LIMA to the LAD, saphenous vein graft to diagonal, saphenous vein graft to OM, saphenous vein graft to posterolateral 2008    Social History   Socioeconomic History  . Marital status: Married    Spouse name: Not on file  . Number of children: Not on file  . Years of education: Not on file  . Highest education level: Not on file  Occupational History  . Not on file  Social Needs  . Financial resource strain: Not on file  . Food insecurity:    Worry: Not on file    Inability: Not on file  . Transportation needs:    Medical: Not on file    Non-medical: Not on file  Tobacco Use  . Smoking status: Former Smoker    Last attempt to quit: 08/29/1995    Years since quitting: 22.9  . Smokeless tobacco: Never Used  Substance and Sexual Activity  . Alcohol use: No  . Drug use: No  . Sexual activity: Yes  Lifestyle  . Physical activity:    Days per week: Not on file    Minutes per session: Not on file  . Stress: Not on file  Relationships  . Social connections:    Talks on phone: Not on file    Gets together: Not on file    Attends religious service: Not on file    Active member of club or organization: Not on file    Attends meetings of clubs or organizations: Not on file    Relationship status: Not on file  . Intimate partner violence:    Fear of current or ex partner: Not on file    Emotionally abused: Not on file    Physically abused: Not on file    Forced sexual activity: Not on file  Other Topics Concern  . Not on file  Social History Narrative  . Not on file     Physical Exam BP 122/78   Pulse 80   Temp (!) 97.5 F (36.4 C)   Ht 5\' 1"  (1.549 m)   Wt 136 lb 6.4 oz (61.9 kg)   SpO2 98%   BMI 25.77 kg/m  Wt Readings from Last 3 Encounters:  08/19/18 136 lb 6.4 oz (61.9 kg)  07/20/18 131 lb (59.4 kg)  05/27/18 137 lb 12.8 oz (62.5 kg)    General Appearance: Well nourished, in no apparent distress.  Eyes: PERRLA, EOMs, conjunctiva no swelling or erythema, normal fundi and vessels.  Sinuses: No Frontal/maxillary tenderness  ENT/Mouth: Ext aud canals clear, normal light reflex with TMs without erythema, bulging. Good dentition. No erythema, swelling, or exudate on post pharynx. Tonsils not swollen or erythematous. Hearing normal.  Neck: Supple, thyroid normal. No bruits  Respiratory: Respiratory effort normal, BS equal bilaterally without rales, rhonchi, wheezing or stridor.  Cardio: RRR with systolic murmur RSB without rubs or gallops. Decreased hair bilateral legs, decrease TP/DP pulses, good bilateral sensation, without edema or ulcers.  Chest: symmetric, with normal excursions and percussion.  Abdomen: Soft, nontender, no guarding, rebound, hernias, masses, or organomegaly. .  Lymphatics: Non tender without lymphadenopathy.  Musculoskeletal: Full ROM all peripheral extremities,5/5 strength, and antalgic gait.   Skin: Warm, dry without rashes, lesions, ecchymosis. Neuro: Cranial nerves intact, reflexes equal bilaterally. Normal muscle tone, no cerebellar symptoms. Sensation intact.  Psych: Awake and oriented X 3, normal affect, Insight and Judgment appropriate.   [name], PA-C 9:55 AM [facility]

## 2018-08-19 ENCOUNTER — Other Ambulatory Visit: Payer: Self-pay

## 2018-08-19 ENCOUNTER — Encounter: Payer: Self-pay | Admitting: Physician Assistant

## 2018-08-19 ENCOUNTER — Ambulatory Visit (INDEPENDENT_AMBULATORY_CARE_PROVIDER_SITE_OTHER): Payer: Medicare Other | Admitting: Physician Assistant

## 2018-08-19 ENCOUNTER — Telehealth: Payer: Self-pay | Admitting: *Deleted

## 2018-08-19 VITALS — BP 122/78 | HR 80 | Temp 97.5°F | Ht 61.0 in | Wt 136.4 lb

## 2018-08-19 DIAGNOSIS — Z7189 Other specified counseling: Secondary | ICD-10-CM

## 2018-08-19 DIAGNOSIS — E782 Mixed hyperlipidemia: Secondary | ICD-10-CM | POA: Diagnosis not present

## 2018-08-19 DIAGNOSIS — Z79899 Other long term (current) drug therapy: Secondary | ICD-10-CM | POA: Diagnosis not present

## 2018-08-19 DIAGNOSIS — E1169 Type 2 diabetes mellitus with other specified complication: Secondary | ICD-10-CM

## 2018-08-19 DIAGNOSIS — I10 Essential (primary) hypertension: Secondary | ICD-10-CM | POA: Diagnosis not present

## 2018-08-19 DIAGNOSIS — I2510 Atherosclerotic heart disease of native coronary artery without angina pectoris: Secondary | ICD-10-CM | POA: Diagnosis not present

## 2018-08-19 MED ORDER — EMPAGLIFLOZIN 25 MG PO TABS: 25.0000 mg | ORAL_TABLET | Freq: Every day | ORAL | 3 refills | Status: DC

## 2018-08-19 NOTE — Patient Instructions (Signed)
We will check your sugars, your A1C needs to be below 7.5 to have the surgery  STARTING A DIABETIC MEDICATION  The medication we are going to start you on is a sodium-glucose cotransporter-2 (SGLT2) inhibitors for your diabetes and weight.   This can decrease your blood pressure so please contact us if you have any dizziness, sometime we need to decrease or stop fluid pills or decrease BP meds.  You are peeing out 300-400 calories a day of sugar which can lead to yeast infections, you can take the diflucan as needed but if the infections continue we will stop the medications.  If you get any pain or discomfort around your buttocks or genitals please let us know if it does not go away. We will have you follow up in 1 month to check your kidney function and weight. Call if you need anything.   RANGE OF A1C   Your A1C is a measure of your sugar over the past 3 months and is not affected by what you have eaten over the past few days. Diabetes increases your chances of stroke and heart attack over 300 % and is the leading cause of blindness and kidney failure in the [place]. Please make sure you decrease bad carbs like white bread, white rice, potatoes, corn, soft drinks, pasta, cereals, refined sugars, sweet tea, dried fruits, and fruit juice. Good carbs are okay to eat in moderation like sweet potatoes, brown rice, whole grain pasta/bread, most fruit (except dried fruit) and you can eat as many veggies as you want.   Greater than 6.5 is considered diabetic. Between 6.4 and 5.7 is prediabetic If your A1C is less than 5.7 you are NOT diabetic.  Targets for Glucose Readings: Time of Check Target for patients WITHOUT Diabetes Target for DIABETICS  Before Meals Less than 100  less than 150  Two hours after meals Less than 200  Less than 250    What does your A1C results mean?  Your A1C is a measure of your sugar over the past 3 months   Use this chart as a guide to compare the results of  your A1C blood test to your estimated average daily blood sugar:  A1C Range Average Sugar  4.0-6.0% 60-120 mg/dl  6.1-7.0% 121 - 150 mg/dl  7.1-8.0% 151-180 mg/dl  8.1-9.0% 181-210 mg/dl  10.1-11% 211-240 mg/dl  11.1-12.0% 271-300 mg/dl  12.1-13.0% 301-330 mg/dl  13.1-14.0% 331-360 mg/dl  Greater than 14.0% Greater than 360 mg/dl       Bad carbs also include fruit juice, alcohol, and sweet tea. These are empty calories that do not signal to your brain that you are full.   Please remember the good carbs are still carbs which convert into sugar. So please measure them out no more than 1/2-1 cup of rice, oatmeal, pasta, and beans  Veggies are however free foods! Pile them on.   Not all fruit is created equal. Please see the list below, the fruit at the bottom is higher in sugars than the fruit at the top. Please avoid all dried fruits.

## 2018-08-19 NOTE — Telephone Encounter (Signed)
Marland KitchenMarland KitchenMarland KitchenMarland KitchenMarland KitchenMarland KitchenMarland Kitchen   COORDINATE-Diabetes 3 Month Patient Questionnaire (Intervention) Site #:   379   Patient ID:    [id number]               3 MONTH QUESTIONNAIRE  Visit Date  08/19/2018    MM DD YYYY  Vital Status [x]   Patient Alive > Proceed to Visit Status []   Patient Dead > Complete Death Form only []   Unknown > Proceed to Visit Status  Visit Status Was the interview completed? []  No >IF NO, Select reason why [] Unable to locate                                    [] No Valid Contacts (patients or alternates) [] Multiple attempts to valid contacts []  Patient no longer cared for at study clinic []  Patient withdrew []  Other, specify:                                           >IF NO, Last date of contact: / /             MM      DD        YYYY    [x] Yes >IF YES, Select source of Interview: []   Proxy [x]   Patient     3 Month Patient Questionnaire (Intervention)  Instructions:   1. Prior to speaking with the patient either over the phone or in person, print off or have available the patient's current list of medications.      IF conducting follow-up visit over the phone - Instruct the patient to gather all their pill bottles. 2. For the purpose of the COORDINATE-Diabetes Study, please document whether the patient is currently taking each of the following medication classes. 3. DO NOT PROMPT DURING FOLLOW-UP VISIT: IF subject mentions reaction to one of the following medications, complete the AE/SAE section and follow instructions in operations manual regarding Adverse event reporting to [employer] ([facility]).  MEDICATIONS  Medication Are you currently taking [iTelemisartan]? If currently taking: If started since last visit: If stopped since last visit:  ACE Inhibitor / Angiotensin Receptor Blocker (ARB) / Angiotensin Receptor Neprilysin inhibitor (ARNi) [x] No >  [] Yes > [x] Stopped since last visit [] Never prescribed [] Started since last visit [] Same medication as last      visit [] Different medication than       last visit [Study personnel to answer]  Documented in EHR? [] No  [] Yes   If no, Verified by: [] Photo of bottle [] Copy of rx [] Dispensing       pharmacy [] Prescribing provider [] Other (specify):                    Date started:     MM DD YYYY Who prescribed this medication for you? [] Cardiology provider > [] Study clinic [] Outside clinic [] Endocrinology provider [] Primary care provider [] Other provider > Specify:                             [] Unknown Date discontinued:  05/27/2018    MM DD YYYY Why did you stop taking this medication? (check all that apply) [] Allergic reaction [x] Medication side effects [] Unable to       adhere/monitor [] Had an      operation/procedure  that required      stopping it [] Unable to afford it [] No longer wants        to take        this medication [] Provider decision [] Pregnancy [] Other (specify: ) [] Unknown Reason   If started or changed  What medication are you taking now? [] Benazepril (Lotensin) [] Captopril (Capoten) [] Enalapril (Vasotec) [] Fosinopril (Monopril) [] Lisinopril (Zestril, Prinivil) [] Quinapril (Accupril) [] Ramipril (Altace) [] Azilsartan (Edarbi) [] Candesartan (Atacand) [] Irbesartan (Avapro) [] Losartan (Cozaar) [] Olmesartan (Benicar) [] Telmisartan (Micardis) [] Valsartan (Diovan) [] Sacrubitril/Valsartan Delene Loll)      Medication Are you currently taking [Rosuvaststin]? If currently taking: If started since last visit: If stopped since last visit:  Statin []  No >  [x] Yes > [] Stopped since last visit [] Never prescribed [] Started since last visit [x] Same medication as last        visit [] Different medication or dose     than last visit [Study personnel to answer]  Documented in EHR? [] No [x] Yes    If no, Verified by: [] Photo of bottle [] Copy of rx [] Dispensing pharmacy [] Prescribing provider [] Other (specify):                    Date  started:        / /             MM DD YYYY Who prescribed this medication for you? [] Cardiology provider  [] Study clinic [] Outside clinic [] Endocrinology provider [] Primary care provider [] Other provider  Specify:                             [] Unknown Date discontinued:        / /             MM DD YYYY Why did you stop taking this medication? (check all that apply) [] Allergic reaction [] Medication side effects  [] Muscle aches [] Weakness [] Joint pain [] Cognitive symptoms [] Other (specify):                          [] Unable to        adhere/monitor [] Had an    operation/procedure    that required stopping it [] Unable to afford it [] No longer wants       to take this            medication [] Provider decision [] Pregnancy [] Other (specify: ) [] Unknown Reason   If started or changed  What medication are you taking now? [] Atorvastatin (Lipitor) [] Fluvastatin (Lescol) [] Lovastatin (Mevacor) [] Pravastatin (Pravachol) [] Rosuvastatin (Crestor) [] Simvastatin (Zocor) [] Pitatavastatin (Livalo)  Dose: [] 1 mg  [] 10 mg [] 2 mg  []  20 mg [] 3 mg  []  40 mg [] 4 mg  []  60 mg [] 5 mg  []  80 mg  Frequency: [] Daily []  Less than daily      Medication Are you currently taking [Farxiga]? If currently taking: If started since last visit: If stopped since last visit:  SGLT2 Inhibitor [] No   [x] Yes [] Stopped since last visit [] Never prescribed [] Started since last visit [] Same medication as last      visit [x] Different medication than last visit [Study personnel to answer]  Documented in EHR? [] No [x] Yes   If no, Verified by: [] Photo of bottle [] Copy of rx [] Dispensing       pharmacy [] Prescribing       provider [] Other (specify):                    Date started:        / /  MM DD YYYY Who prescribed this medication for you? [] Cardiology provider  [] Study clinic [] Outside clinic [] Endocrinology provider [] Primary care provider [] Other provider   Specify:                             [] Unknown Date discontinued:   07/09/2018    MM DD YYYY Why did you stop taking this medication? (check all that apply) [] Allergic reaction [] Medication side effects [] Unable to       adhere/monitor [] Had an        operation/procedure         that required           stopping it [x] Patient unable to       afford it [] Patient no longer       wants to       take this medication [] Provider decision [] Pregnancy [] Other (specify: ) [] Unknown Reason   If started or changed > What medication are you taking now? [] Canaglifozin (Invokana) [] Dapagliflozin (Farxiga) [x] Empaglifozin (Jardiance) [] Ertugliflozin (Steglatro)     GLP1 Receptor Agonist [x] No   [] Yes [] Stopped since last visit [x] Never prescribed [] Started since last visit [] Same medication as last      visit [] Different medication than       last visit [Study personnel to answer]  Documented in EHR? [] No [] Yes   If no, Verified by: [] Photo of bottle [] Copy of rx [] Dispensing       pharmacy [] Prescribing       provider [] Other (specify):                    Date started:        / /             MM DD YYYY Who prescribed this medication for you? [] Cardiology provider  [] Study clinic [] Outside clinic [] Endocrinology provider [] Primary care provider [] Other provider  Specify:                             [] Unknown Date discontinued:        / /             MM DD YYYY Why did you stop taking this medication? (check all that apply) [] Allergic reaction [] Medication side effects [] Unable to      adhere/monitor [] Had an    operation/procedure    that required stopping it [] Patient unable to        afford it [] Patient no longer        wants to take this        medication [] Provider decision [] Pregnancy [] Other (specify: ) [] Unknown Reason   If started or changed  What medication are you taking now? [] Albiglutide (Tanzeum) [] Dulaglutide  (Trulicity) [] Exanatide (Byetta, Bydureon) [] Liraglutide (Victoza, Saxenda) [] Lixisenatide (Adlyxin) [] Semaglutice (Ozempic)     05/19/2018 (EDC Release)   Spoke with patient via phone for Coordinate Dm 3 month visit. Pt doing well. Pt states " I am  suppose to have knee surgery on September 04, 2018 if my [place] is less than 7. I had it drawn toady and should find out my results out tomorrow. My sugars have been running 120-130."  DM education given to patient.

## 2018-08-20 LAB — COMPLETE METABOLIC PANEL WITH GFR
AG Ratio: 1.7 (calc) (ref 1.0–2.5)
ALT: 29 U/L (ref 9–46)
AST: 21 U/L (ref 10–35)
Albumin: 3.7 g/dL (ref 3.6–5.1)
Alkaline phosphatase (APISO): 64 U/L (ref 35–144)
BUN: 17 mg/dL (ref 7–25)
CO2: 27 mmol/L (ref 20–32)
Calcium: 8.9 mg/dL (ref 8.6–10.3)
Chloride: 102 mmol/L (ref 98–110)
Creat: 0.87 mg/dL (ref 0.70–1.18)
GFR, Est African American: 101 mL/min/{1.73_m2} (ref >=60)
GFR, Est Non African American: 87 mL/min/{1.73_m2} (ref >=60)
Globulin: 2.2 g/dL (calc) (ref 1.9–3.7)
Glucose, Bld: 202 mg/dL — ABNORMAL HIGH (ref 65–99)
Potassium: 5.4 mmol/L — ABNORMAL HIGH (ref 3.5–5.3)
Sodium: 133 mmol/L — ABNORMAL LOW (ref 135–146)
Total Bilirubin: 0.4 mg/dL (ref 0.2–1.2)
Total Protein: 5.9 g/dL — ABNORMAL LOW (ref 6.1–8.1)

## 2018-08-20 LAB — CBC WITH DIFFERENTIAL/PLATELET
Absolute Monocytes: 712 cells/uL (ref 200–950)
Basophils Absolute: 72 cells/uL (ref 0–200)
Basophils Relative: 0.9 %
Eosinophils Absolute: 576 cells/uL — ABNORMAL HIGH (ref 15–500)
Eosinophils Relative: 7.2 %
HCT: 37.4 % — ABNORMAL LOW (ref 38.5–50.0)
Hemoglobin: 10.8 g/dL — ABNORMAL LOW (ref 13.2–17.1)
Lymphs Abs: 1328 cells/uL (ref 850–3900)
MCH: 20.3 pg — ABNORMAL LOW (ref 27.0–33.0)
MCHC: 28.9 g/dL — ABNORMAL LOW (ref 32.0–36.0)
MCV: 70.3 fL — ABNORMAL LOW (ref 80.0–100.0)
MPV: 11.5 fL (ref 7.5–12.5)
Monocytes Relative: 8.9 %
Neutro Abs: 5312 cells/uL (ref 1500–7800)
Neutrophils Relative %: 66.4 %
Platelets: 273 10*3/uL (ref 140–400)
RBC: 5.32 10*6/uL (ref 4.20–5.80)
RDW: 16 % — ABNORMAL HIGH (ref 11.0–15.0)
Total Lymphocyte: 16.6 %
WBC: 8 10*3/uL (ref 3.8–10.8)

## 2018-08-20 LAB — HEMOGLOBIN A1C
Hgb A1c MFr Bld: 8.5 % of total Hgb — ABNORMAL HIGH (ref <5.7)
Mean Plasma Glucose: 197 (calc)
eAG (mmol/L): 10.9 (calc)

## 2018-08-20 LAB — LIPID PANEL
Cholesterol: 78 mg/dL (ref <200)
HDL: 31 mg/dL — ABNORMAL LOW (ref >=40)
LDL Cholesterol (Calc): 31 mg/dL (calc)
Non-HDL Cholesterol (Calc): 47 mg/dL (calc) (ref <130)
Total CHOL/HDL Ratio: 2.5 (calc) (ref <5.0)
Triglycerides: 75 mg/dL (ref <150)

## 2018-08-20 LAB — TSH: TSH: 2.62 mIU/L (ref 0.40–4.50)

## 2018-08-20 LAB — MAGNESIUM: Magnesium: 1.6 mg/dL (ref 1.5–2.5)

## 2018-08-23 ENCOUNTER — Other Ambulatory Visit: Payer: Self-pay | Admitting: Adult Health

## 2018-08-24 ENCOUNTER — Other Ambulatory Visit: Payer: Self-pay | Admitting: Internal Medicine

## 2018-08-24 ENCOUNTER — Other Ambulatory Visit: Payer: Self-pay

## 2018-08-24 DIAGNOSIS — R05 Cough: Secondary | ICD-10-CM

## 2018-08-24 DIAGNOSIS — T464X5A Adverse effect of angiotensin-converting-enzyme inhibitors, initial encounter: Secondary | ICD-10-CM

## 2018-08-24 DIAGNOSIS — I10 Essential (primary) hypertension: Secondary | ICD-10-CM

## 2018-08-24 MED ORDER — BLOOD GLUCOSE METER KIT: PACK | 0 refills | Status: AC

## 2018-09-03 ENCOUNTER — Ambulatory Visit: Payer: Medicare Other | Admitting: Internal Medicine

## 2018-09-04 ENCOUNTER — Ambulatory Visit (HOSPITAL_COMMUNITY): Admission: RE | Admit: 2018-09-04 | Payer: Medicare Other | Source: Home / Self Care | Admitting: Orthopedic Surgery

## 2018-09-04 ENCOUNTER — Encounter (HOSPITAL_COMMUNITY): Admission: RE | Payer: Self-pay | Source: Home / Self Care

## 2018-09-04 SURGERY — ARTHROPLASTY, KNEE, TOTAL
Anesthesia: Spinal | Laterality: Left

## 2018-09-16 NOTE — Progress Notes (Signed)
Diabetes Education and Follow-Up Visit  68 y.o.male presents for diabetic education, patient wants to get a knee replacement but his A1C was not at goal so medically he was not cleared.  He has increase his amaryl and taking jardiance.  .  He has Diabetes Mellitus type 2  Patient denies chest pain, shortness of breath. He is on amaryl 4 mg BID, and metformin 2058m a day.  He is unable to get on farxiga, given samples of jardiance last time but has been off.  His sugars have been 120-140 in the AM. After meals about 2 hours it is 250-300.   Last hemoglobin A1c was: Lab Results  Component Value Date   HGBA1C 8.5 (H) 08/19/2018   HGBA1C 9.7 (H) 05/27/2018   HGBA1C 9.6 (H) 02/18/2018     Lab Results  Component Value Date   GFRNONAA 87 08/19/2018    Body mass index is 25.55 kg/m.  Glucometer: Accucheck  Patient does not have CKD He is on ACE/ARB  Lab Results  Component Value Date   GFRAA 101 08/19/2018     Lab Results  Component Value Date   GFRNONAA 87 08/19/2018    Lab Results  Component Value Date   CREATININE 0.87 08/19/2018   BUN 17 08/19/2018   NA 133 (L) 08/19/2018   K 5.4 (H) 08/19/2018   CL 102 08/19/2018   CO2 27 08/19/2018    Lab Results  Component Value Date   MICROALBUR 1.0 05/27/2018     He is on a Statin.  He is at goal of less than 70.  Lab Results  Component Value Date   CHOL 78 08/19/2018   HDL 31 (L) 08/19/2018   LDLCALC 31 08/19/2018   TRIG 75 08/19/2018   CHOLHDL 2.5 08/19/2018     Problem List has Hyperlipidemia; Coronary atherosclerosis; Carotid stenosis; Essential hypertension; Vitamin D deficiency; Medication management; Encounter for Medicare annual wellness exam; Neuropathy, diabetic ([place]; DM type 2 (diabetes mellitus, type 2) ([facility]; Poor compliance; Overweight (BMI 25.0-29.9); Diabetic retinopathy of both eyes ([place]; and beta thalassemia minor (trait) anemia on their problem list.  Medications Current Outpatient  Medications on File Prior to Visit  Medication Sig  . aspirin 81 MG tablet Take 81 mg by mouth daily.  . blood glucose meter kit and supplies Test sugars once to twice daily  . Cholecalciferol (VITAMIN D3 PO) Take 5,000 Units by mouth.   . Cinnamon 500 MG capsule Take 2 capsules (1,000 mg total) by mouth 2 (two) times daily.  . empagliflozin (JARDIANCE) 25 MG TABS tablet Take 25 mg by mouth daily.  .Marland Kitchenglimepiride (AMARYL) 4 MG tablet Take 1 tablet 2 x / day for Diabetes  . Magnesium 250 MG TABS Take by mouth daily.  . metFORMIN (GLUCOPHAGE) 500 MG tablet TAKE 2 TABLETS BY MOUTH EVERY MORNING AND 2 EVERY EVENING  . metoprolol tartrate (LOPRESSOR) 25 MG tablet Take 1 tablet 2 x /day for BP  . Multiple Vitamin (MULTIVITAMIN) tablet Take 1 tablet by mouth daily.  . rosuvastatin (CRESTOR) 20 MG tablet TAKE 1 TABLET BY MOUTH EVERY DAY  . telmisartan (MICARDIS) 20 MG tablet TAKE 1 TABLET DAILY FOR BP & KIDNEY PROTECTION   No current facility-administered medications on file prior to visit.     ROS- see HPI  Physical Exam: Blood pressure 132/80, pulse 96, temperature 97.7 F (36.5 C), height 5' 1"  (1.549 m), weight 135 lb 3.2 oz (61.3 kg), SpO2 96 %. Body mass index is 25.55 kg/m.  General Appearance: Well nourished, in no apparent distress. Eyes: PERRLA, EOMs, conjunctiva no swelling or erythema ENT/Mouth: Ext aud canals clear, TMs without erythema, bulging. No erythema, swelling, or exudate on post pharynx.  Tonsils not swollen or erythematous. Hearing normal.  Respiratory: Respiratory effort normal, BS equal bilaterally without rales, rhonchi, wheezing or stridor.  Cardio: RRR with no MRGs. Brisk peripheral pulses without edema.  Abdomen: Soft, + BS.  Non tender, no guarding, rebound, hernias, masses. Musculoskeletal: Full ROM, 5/5 strength, normal gait.  Skin: Warm, dry without rashes, lesions, ecchymosis.  Neuro: Cranial nerves intact. Normal muscle tone, no cerebellar symptoms. Sensation  intact.    Plan and Assessment: Diabetes Education: Reviewed 'ABCs' of diabetes management (respective goals in parentheses):  A1C (<7), blood pressure (<130/80), and cholesterol (LDL <70) Eye Exam yearly and Dental Exam every 6 months. Dietary recommendations  Physical Activity recommendations - Strongly advised him to start checking sugars at different times of the day - check 2 times a day, rotating checks - given sugar log and advised how to fill it and to bring it at next appt  - given foot care handout and explained the principles  - given instructions for hypoglycemia management  - given jardiance samples, continue glipizide and metformin - goal is close to 7.5 for surgical clearance.  Follow up 2 weeks   Future Appointments  Date Time Provider [place]  11/19/2018  2:00 PM Liane Comber, NP [facility] None  05/31/2019  9:00 AM [name], PA-C [facility] None

## 2018-09-17 ENCOUNTER — Other Ambulatory Visit: Payer: Self-pay

## 2018-09-17 ENCOUNTER — Ambulatory Visit (INDEPENDENT_AMBULATORY_CARE_PROVIDER_SITE_OTHER): Payer: Medicare Other | Admitting: Physician Assistant

## 2018-09-17 ENCOUNTER — Encounter: Payer: Self-pay | Admitting: Physician Assistant

## 2018-09-17 VITALS — BP 132/80 | HR 96 | Temp 97.7°F | Ht 61.0 in | Wt 135.2 lb

## 2018-09-17 DIAGNOSIS — E113293 Type 2 diabetes mellitus with mild nonproliferative diabetic retinopathy without macular edema, bilateral: Secondary | ICD-10-CM

## 2018-09-17 DIAGNOSIS — E1169 Type 2 diabetes mellitus with other specified complication: Secondary | ICD-10-CM | POA: Diagnosis not present

## 2018-09-17 DIAGNOSIS — E1142 Type 2 diabetes mellitus with diabetic polyneuropathy: Secondary | ICD-10-CM

## 2018-09-17 NOTE — Progress Notes (Signed)
Sure do!

## 2018-09-17 NOTE — Patient Instructions (Signed)
If your morning sugar is always below 100 then the issue is with your sugar spiking after meals. Try to take your blood sugar approximately 2 hours after eating, this number should be less than 200. If it is not, think about the foods that you ate and better choices you can make.  Please keep your sugar log and follow up 2 weeks Please take the glipizide twice a day Continue metformin Continue the jardiance  Will recheck 1 month  What does your A1C results mean?  Your A1C is a measure of your sugar over the past 3 months   Use this chart as a guide to compare the results of your A1C blood test to your estimated average daily blood sugar:  A1C Range Average Sugar  4.0-6.0% 60-120 mg/dl  6.1-7.0% 121 - 150 mg/dl  7.1-8.0% 151-180 mg/dl  8.1-9.0% 181-210 mg/dl  10.1-11% 211-240 mg/dl  11.1-12.0% 271-300 mg/dl  12.1-13.0% 301-330 mg/dl  13.1-14.0% 331-360 mg/dl  Greater than 14.0% Greater than 360 mg/dl

## 2018-09-30 NOTE — Progress Notes (Signed)
Diabetes Education and Follow-Up Visit  68 y.o.male presents for diabetic education, patient wants to get a knee replacement but his A1C was not at goal so medically he was not cleared.  He has increase his amaryl and taking jardiance.  .  He has Diabetes Mellitus type 2  Patient denies chest pain, shortness of breath. He is on amaryl 4 mg BID, and metformin 2060m a day.  He is unable to get on farxiga, given samples of jardiance last time.  His sugars have been 120 in the AM, occ as low as 74. After meals about 2 hours it is 200-250, no 300's.  He has stopped eating rice.   Last hemoglobin A1c was: Lab Results  Component Value Date   HGBA1C 8.5 (H) 08/19/2018   HGBA1C 9.7 (H) 05/27/2018   HGBA1C 9.6 (H) 02/18/2018     Lab Results  Component Value Date   GFRNONAA 87 08/19/2018    Body mass index is 24.75 kg/m.  Glucometer: Accucheck  Patient does not have CKD He is on ACE/ARB  Lab Results  Component Value Date   GFRNONAA 87 08/19/2018    Lab Results  Component Value Date   CREATININE 0.87 08/19/2018   BUN 17 08/19/2018   NA 133 (L) 08/19/2018   K 5.4 (H) 08/19/2018   CL 102 08/19/2018   CO2 27 08/19/2018    Lab Results  Component Value Date   MICROALBUR 1.0 05/27/2018     He is on a Statin.  He is at goal of less than 70.  Lab Results  Component Value Date   CHOL 78 08/19/2018   HDL 31 (L) 08/19/2018   LDLCALC 31 08/19/2018   TRIG 75 08/19/2018   CHOLHDL 2.5 08/19/2018     Problem List has Hyperlipidemia; Coronary atherosclerosis; Carotid stenosis; Essential hypertension; Vitamin D deficiency; Medication management; Encounter for Medicare annual wellness exam; Neuropathy, diabetic ([facility]; DM type 2 (diabetes mellitus, type 2) ([facility]; Poor compliance; Overweight (BMI 25.0-29.9); Diabetic retinopathy of both eyes ([place]; and beta thalassemia minor (trait) anemia on their problem list.  Medications Current Outpatient Medications on File Prior to Visit   Medication Sig  . aspirin 81 MG tablet Take 81 mg by mouth daily.  . blood glucose meter kit and supplies Test sugars once to twice daily  . Cholecalciferol (VITAMIN D3 PO) Take 5,000 Units by mouth.   . Cinnamon 500 MG capsule Take 2 capsules (1,000 mg total) by mouth 2 (two) times daily.  . empagliflozin (JARDIANCE) 25 MG TABS tablet Take 25 mg by mouth daily.  .Marland Kitchenglimepiride (AMARYL) 4 MG tablet Take 1 tablet 2 x / day for Diabetes  . Magnesium 250 MG TABS Take by mouth daily.  . metFORMIN (GLUCOPHAGE) 500 MG tablet TAKE 2 TABLETS BY MOUTH EVERY MORNING AND 2 EVERY EVENING  . metoprolol tartrate (LOPRESSOR) 25 MG tablet Take 1 tablet 2 x /day for BP  . Multiple Vitamin (MULTIVITAMIN) tablet Take 1 tablet by mouth daily.  . rosuvastatin (CRESTOR) 20 MG tablet TAKE 1 TABLET BY MOUTH EVERY DAY  . telmisartan (MICARDIS) 20 MG tablet TAKE 1 TABLET DAILY FOR BP & KIDNEY PROTECTION   No current facility-administered medications on file prior to visit.     ROS- see HPI  Physical Exam: Height 5' 1"  (1.549 m), weight 131 lb (59.4 kg). Body mass index is 24.75 kg/m. General Appearance: Well nourished, in no apparent distress. Eyes: PERRLA, EOMs, conjunctiva no swelling or erythema ENT/Mouth: Ext aud canals clear,  TMs without erythema, bulging. No erythema, swelling, or exudate on post pharynx.  Tonsils not swollen or erythematous. Hearing normal.  Respiratory: Respiratory effort normal, BS equal bilaterally without rales, rhonchi, wheezing or stridor.  Cardio: RRR with no MRGs. Brisk peripheral pulses without edema.  Abdomen: Soft, + BS.  Non tender, no guarding, rebound, hernias, masses. Musculoskeletal: Full ROM, 5/5 strength, normal gait.  Skin: Warm, dry without rashes, lesions, ecchymosis.  Neuro: Cranial nerves intact. Normal muscle tone, no cerebellar symptoms. Sensation intact.    Plan and Assessment: Diabetes Education: Reviewed 'ABCs' of diabetes management (respective goals in  parentheses):  A1C (<7), blood pressure (<130/80), and cholesterol (LDL <70) Eye Exam yearly and Dental Exam every 6 months. Dietary recommendations  Physical Activity recommendations - Strongly advised him to start checking sugars at different times of the day - check 2 times a day, rotating checks - given sugar log and advised how to fill it and to bring it at next appt  - given foot care handout and explained the principles  - given instructions for hypoglycemia management  - given jardiance samples, continue glipizide and metformin - goal is close to 7.5 for surgical clearance.    Future Appointments  Date Time Provider [place]  11/19/2018  2:00 PM Liane Comber, NP [facility] None  05/31/2019  9:00 AM [name], PA-C [facility] None

## 2018-10-01 ENCOUNTER — Encounter: Payer: Self-pay | Admitting: Physician Assistant

## 2018-10-01 ENCOUNTER — Ambulatory Visit (INDEPENDENT_AMBULATORY_CARE_PROVIDER_SITE_OTHER): Payer: Medicare Other | Admitting: Physician Assistant

## 2018-10-01 ENCOUNTER — Other Ambulatory Visit: Payer: Self-pay

## 2018-10-01 VITALS — BP 122/72 | HR 85 | Temp 97.9°F | Ht 61.0 in | Wt 131.0 lb

## 2018-10-01 DIAGNOSIS — E1142 Type 2 diabetes mellitus with diabetic polyneuropathy: Secondary | ICD-10-CM

## 2018-10-01 DIAGNOSIS — E1169 Type 2 diabetes mellitus with other specified complication: Secondary | ICD-10-CM

## 2018-10-01 NOTE — Patient Instructions (Signed)
What does your A1C results mean?  Your A1C is a measure of your sugar over the past 3 months   Use this chart as a guide to compare the results of your A1C blood test to your estimated average daily blood sugar:  A1C Range Average Sugar  4.0-6.0% 60-120 mg/dl  6.1-7.0% 121 - 150 mg/dl  7.1-8.0% 151-180 mg/dl  8.1-9.0% 181-210 mg/dl  10.1-11% 211-240 mg/dl  11.1-12.0% 271-300 mg/dl  12.1-13.0% 301-330 mg/dl  13.1-14.0% 331-360 mg/dl  Greater than 14.0% Greater than 360 mg/dl      Bad carbs also include fruit juice, alcohol, and sweet tea. These are empty calories that do not signal to your brain that you are full.   Please remember the good carbs are still carbs which convert into sugar. So please measure them out no more than 1/2-1 cup of rice, oatmeal, pasta, and beans  Veggies are however free foods! Pile them on.   Not all fruit is created equal. Please see the list below, the fruit at the bottom is higher in sugars than the fruit at the top. Please avoid all dried fruits.

## 2018-10-05 LAB — HEMOGLOBIN A1C
Hgb A1c MFr Bld: 8 % of total Hgb — ABNORMAL HIGH (ref <5.7)
Mean Plasma Glucose: 183 (calc)
eAG (mmol/L): 10.1 (calc)

## 2018-10-05 LAB — FRUCTOSAMINE: Fructosamine: 305 umol/L — ABNORMAL HIGH (ref 205–285)

## 2018-10-06 ENCOUNTER — Telehealth: Payer: Self-pay | Admitting: Physician Assistant

## 2018-10-06 MED ORDER — TRESIBA 100 UNIT/ML ~~LOC~~ SOLN: 10.0000 [IU] | Freq: Every day | SUBCUTANEOUS | 0 refills | Status: DC

## 2018-10-06 MED ORDER — NOVOFINE 32G X 6 MM MISC: 5 refills | Status: DC

## 2018-10-06 NOTE — Telephone Encounter (Signed)
-----   Message from William Haney, [place] sent at 10/05/2018  4:57 PM EDT ----- The patient has been notified of this information and all questions answered. He would like to know when he can pick up & what insulin should he take?

## 2018-10-06 NOTE — Telephone Encounter (Signed)
-----   Message from [name], [place] sent at 10/05/2018  4:57 PM EDT ----- The patient has been notified of this information and all questions answered. He would like to know when he can pick up & what insulin should he take?

## 2018-10-14 ENCOUNTER — Telehealth: Payer: Self-pay | Admitting: Physician Assistant

## 2018-10-14 ENCOUNTER — Telehealth: Payer: Self-pay

## 2018-10-14 MED ORDER — INSULIN DETEMIR 100 UNIT/ML FLEXPEN: PEN_INJECTOR | SUBCUTANEOUS | 11 refills | Status: DC

## 2018-10-14 NOTE — Telephone Encounter (Signed)
-----   Message from [name], [place] sent at 10/14/2018  1:40 PM EDT ----- Regarding: RE: sample We are out of samples then, I can send in a prescription for him.  [name] ----- Message ----- From: [name], [place]: 10/13/2018  12:21 PM EDT To: [name], PA-C Subject: sample                                         Patient was given a  sample of TRESIBA.   The samples were EXPIRED.  Patient returned SAMPLE to the office which was thrown away.  Please advise per PATIENT.  PATIENT was told that we would contact him tomorrow.

## 2018-10-14 NOTE — Telephone Encounter (Signed)
Patient would like the [facility] prescription sent to his pharmacy.

## 2018-10-14 NOTE — Telephone Encounter (Signed)
-----   Message from William Haney, [place] sent at 10/13/2018 12:21 PM EDT ----- Regarding: sample Patient was given a  sample of TRESIBA.   The samples were EXPIRED.  Patient returned SAMPLE to the office which was thrown away.  Please advise per PATIENT.  PATIENT was told that we would contact him tomorrow.

## 2018-11-09 ENCOUNTER — Ambulatory Visit: Payer: Self-pay | Admitting: Adult Health

## 2018-11-17 ENCOUNTER — Other Ambulatory Visit: Payer: Self-pay | Admitting: Physician Assistant

## 2018-11-18 NOTE — Progress Notes (Signed)
MEDICARE ANNUAL WELLNESS VISIT AND FOLLOW UP Assessment:   Encounter for Medicare annual wellness exam  Essential hypertension - continue medications, DASH diet, exercise and monitor at home. Call if greater than 130/80.  -     CBC with Differential/Platelet -     CMP/GFR -     TSH  Atherosclerosis of native coronary artery of native heart without angina pectoris Control blood pressure, cholesterol, glucose, increase exercise.  -     Lipid panel -     Hemoglobin A1c  Stenosis of carotid artery, unspecified laterality Control blood pressure, cholesterol, glucose, increase exercise.  Followed by cardiology; moderate disease -     Lipid panel -     Hemoglobin A1c  Type 2 diabetes mellitus with other circulatory complication, without long-term current use of insulin (HCC) Off of insulin On metformin, glimepiride, newly on jardiance- much improved fasting glucose A1C goal <7.5% for surgery  Discussed general issues about diabetes pathophysiology and management., Educational material distributed., Suggested low cholesterol diet., Encouraged aerobic exercise., Discussed foot care., Reminded to get yearly retinal exam. -     Hemoglobin A1c  Diabetic polyneuropathy associated with type 2 diabetes mellitus (HCC) Intermittent burning in bilateral feet, none recent, declines medications Foot exam completed today  Control sugars, check feet daily -     Hemoglobin A1c  Type 2 diabetes mellitus with stage 2 chronic kidney disease, without long-term current use of insulin ([place]) Discussed general issues about diabetes pathophysiology and management., Educational material distributed., Suggested low cholesterol diet., Encouraged aerobic exercise., Discussed foot care., Reminded to get yearly retinal exam- report for this year in system -     Hemoglobin A1c  Type 2 diabetes mellitus with diabetic retinopathy Control sugars, followed by Ophthalmology  Hyperlipidemia -continue medications,  check lipids, decrease fatty foods, increase activity.  -     Lipid panel  Anemia, beta thalassemia minor trait -     CBC with Differential/Platelet  Medication management -     Magnesium  Vitamin D deficiency Continue vitamin D supplement Defer vit D level to CPE  Former smoker, 34 pack year history, quit 2007 -lung cancer screening with low dose CT discussed as recommended by guidelines based on age, number of pack year history.  Discussed risks of screening including but not limited to false positives on xray, further testing or consultation with specialist, and possible false negative CT as well. Understanding expressed and wishes to proceed with CT testing. Order placed.   Bil knee arthritis Pending TKA by Dr. Berenice Primas  Future Appointments  Date Time Provider [place]  05/31/2019  9:00 AM [name], PA-C [id number] None      Plan:   During the course of the visit the patient was educated and counseled about appropriate screening and preventive services including:    Pneumococcal vaccine   Influenza vaccine  Td vaccine  Screening electrocardiogram  Colorectal cancer screening  Diabetes screening  Glaucoma screening  Nutrition counseling    Subjective:  William Haney is a 68 y.o. male who presents for Medicare Annual Wellness Visit and 3 month follow up for HTN, hyperlipidemia, diabetes, and vitamin D Def.    He follows with Dr. Berenice Primas for knee pain, has had shots Needing surgical clearance for total knee arthroplasty with Dr. Berenice Primas.  Has been surgically cleared by his cardiologist Dr. Percival Spanish. We are working to get A1C <7.5% prior to final clearance He is checking glucose three times daily, fasting, after lunch, and prior to bedtime - per  his meter, average over the last week was 129, average over last 30 days was 120 He is no longer on insulin due to hypoglycemia, taking metformin and jardiance only. Working with financial assistance program  due to $142 cost  BMI is Body mass index is 24.94 kg/m., he has been working on diet and exercise. Gets on stationary cycle 20-25 min daily.  Wt Readings from Last 3 Encounters:  11/19/18 132 lb (59.9 kg)  10/01/18 131 lb (59.4 kg)  09/17/18 135 lb 3.2 oz (61.3 kg)   He has a history of CAD s/p CABG in 2008, follows with Dr. Percival Spanish. Also follows for moderate bilateral carotid stenosis getting annual carotid US His blood pressure has been controlled at home (120/80), today their BP is BP: 120/70 He does workout.  He denies chest pain, shortness of breath, dizziness.   He is on cholesterol medication (rosuvastatin 20 mg daily) .His cholesterol is not at goal. The cholesterol last visit was:   Lab Results  Component Value Date   CHOL 78 08/19/2018   HDL 31 (L) 08/19/2018   LDLCALC 31 08/19/2018   TRIG 75 08/19/2018   CHOLHDL 2.5 08/19/2018   He has been working on diet and exercise for diabetes with history of CAD/PAD ,he is on 33m ASA,he is not on ACE/ARB, he is on metformin 2000 mg daily, glimepiride 4 mg BID, more recnelty on jardiance 25 mg daily, has since had several hypoglyemic episodes and stopped taking levemir (previously taking 10-20 units daily) He does check fasting glucose, has been better and running 120-130 or so. Retinopathy - has had eye exam, will request report HBartholome Billreported parasthesias in the past but none recently  Denies foot ulcerations, hyperglycemia, polydipsia, polyuria and visual disturbances.  Last A1C in the office was:  Lab Results  Component Value Date   HGBA1C 8.0 (H) 10/01/2018   Lab Results  Component Value Date   GFRNONAA 87 08/19/2018   Patient is on Vitamin D supplement.   Lab Results  Component Value Date   VD25OH 54 02/18/2018        Medication Review: Current Outpatient Medications on File Prior to Visit  Medication Sig  . aspirin 81 MG tablet Take 81 mg by mouth daily.  . blood glucose meter kit and supplies Test sugars once to  twice daily  . Cholecalciferol (VITAMIN D3 PO) Take 5,000 Units by mouth.   . Cinnamon 500 MG capsule Take 2 capsules (1,000 mg total) by mouth 2 (two) times daily.  . empagliflozin (JARDIANCE) 25 MG TABS tablet Take 25 mg by mouth daily.  .Marland Kitchenglimepiride (AMARYL) 4 MG tablet Take 1 tablet 2 x / day for Diabetes  . metFORMIN (GLUCOPHAGE) 500 MG tablet TAKE 2 TABLETS BY MOUTH EVERY MORNING AND 2 EVERY EVENING  . metoprolol tartrate (LOPRESSOR) 25 MG tablet Take 1 tablet 2 x /day for BP  . Multiple Vitamin (MULTIVITAMIN) tablet Take 1 tablet by mouth daily.  . rosuvastatin (CRESTOR) 20 MG tablet Take 1 tablet Daily for Cholesterol  . telmisartan (MICARDIS) 20 MG tablet TAKE 1 TABLET DAILY FOR BP & KIDNEY PROTECTION  . Insulin Degludec (TRESIBA) 100 UNIT/ML SOLN Inject 10 Units into the skin daily. (Patient not taking: Reported on 11/19/2018)  . Insulin Detemir (LEVEMIR) 100 UNIT/ML Pen 10-20 units daily as needed to reach glucose goal (Patient not taking: Reported on 11/19/2018)  . Magnesium 250 MG TABS Take by mouth daily.  .Marland KitchenNOVOFINE 32G X 6 MM MISC Needs daily  for long acting insulin pen (Patient not taking: Reported on 11/19/2018)   No current facility-administered medications on file prior to visit.     Current Problems (verified) Patient Active Problem List   Diagnosis Date Noted  . beta thalassemia minor (trait) anemia 03/08/2018  . Overweight (BMI 25.0-29.9) 08/10/2017  . Diabetic retinopathy of both eyes ([place]) 08/10/2017  . Poor compliance 09/09/2015  . DM type 2 (diabetes mellitus, type 2) ([place]) 09/08/2015  . Neuropathy, diabetic ([place]) 05/19/2015  . Encounter for Medicare annual wellness exam 09/21/2014  . Essential hypertension 10/11/2013  . Vitamin D deficiency 10/11/2013  . Medication management 10/11/2013  . Hyperlipidemia associated with type 2 diabetes mellitus ([place]) 03/01/2010  . Coronary atherosclerosis 03/01/2010  . Carotid stenosis 03/01/2010    Screening  Tests Immunization History  Administered Date(s) Administered  . DT 02/07/2015  . Influenza Whole 12/10/2012  . Influenza, High Dose Seasonal PF 02/07/2015, 01/02/2016, 12/13/2016, 01/01/2018  . Influenza-Unspecified 12/31/2013  . Meningococcal Conjugate 09/03/2016  . Pneumococcal Conjugate-13 02/02/2014  . Pneumococcal Polysaccharide-23 04/13/2003, 10/08/2016  . Td 04/18/2004   Preventative care: Tetanus:2016 Pneumovax: 2018 Prevnar 13: 2015 Flu vaccine: 2019 DUE today  Zostavax: declines  DEXA: declines Colonoscopy: 2014 due 2024  CXr last 2018 - has 34 pack year smoking hx, quit in 2007 - CT lung ordered after discussion   EGD:N/A Echo 01/2008 Stress test 2013 MRI head 03/2007 Carotid US- 08/14/2018 - by Dr. Percival Spanish   Names of Other Physician/Practitioners you currently use: 1. [facility] here for primary care 2. [facility], Dr. Clydene Laming, eye doctor, last visit 04/14/2017 - report verified and abstracted, retinopathy, has had since, report requested  3. Dr. Burt Knack, dentist, last visit several years ago - has full dentures  Patient Care Team: Unk Pinto, MD as PCP - General (Internal Medicine) Minus Breeding, MD as PCP - Cardiology (Cardiology) Keene Breath., MD as Consulting Physician (Ophthalmology) Minus Breeding, MD as Consulting Physician (Cardiology) Clarene Essex, MD as Consulting Physician (Gastroenterology) Berenice Primas, MD as Referring Physician (Orthopedic Surgery)  History reviewed: allergies, current medications, past family history, past medical history, past social history, past surgical history and problem list  Allergies Allergies  Allergen Reactions  . Ace Inhibitors Cough  . Statins Other (See Comments)    Elevated LFT's    SURGICAL HISTORY He  has a past surgical history that includes Coronary artery bypass graft (2008). FAMILY HISTORY His family history includes Diabetes in his sister; Heart  attack in his father; Heart disease in his brother and father; Hyperlipidemia in his father; Hypertension in his father. SOCIAL HISTORY He  reports that he quit smoking about 23 years ago. He has never used smokeless tobacco. He reports that he does not drink alcohol or use drugs.  MEDICARE WELLNESS OBJECTIVES: Physical activity: Current Exercise Habits: Home exercise routine, Type of exercise: Other - see comments(stationary cycle), Time (Minutes): 25, Frequency (Times/Week): 7, Weekly Exercise (Minutes/Week): 175, Intensity: Mild, Exercise limited by: orthopedic condition(s) Cardiac risk factors: Cardiac Risk Factors include: advanced age (>39mn, >>47women);male gender;diabetes mellitus;dyslipidemia;hypertension;smoking/ tobacco exposure Depression/mood screen:   [facility]  Decreased Interest 0  Down, Depressed, Hopeless 0  PHQ - 2 Score 0    ADLs:  In your present state of health, do you have any difficulty performing the following activities: 11/19/2018 02/18/2018  Hearing? N N  Vision? N N  Difficulty concentrating or making decisions? N N  Walking or climbing stairs? N N  Comment knee pain, due to TKA, manages with cane -  Dressing or bathing? N N  Doing errands, shopping? N N  Some recent data might be hidden     Cognitive Testing  Alert? Yes  Normal Appearance?Yes  Oriented to person? Yes  Place? Yes   Time? Yes  Recall of three objects?  Yes  Can perform simple calculations? Yes  Displays appropriate judgment?Yes  Can read the correct time from a watch face?Yes  EOL planning: Does Patient Have a Medical Advance Directive?: No Would patient like information on creating a medical advance directive?: No - Patient declined   Objective:   Blood pressure 120/70, pulse 64, temperature (!) 96.3 F (35.7 C), height _0  (1.549 m), weight 132 lb (59.9 kg), SpO2 98 %. Body mass index is 24.94 kg/m.  General appearance: alert, no distress, WD/WN,  male HEENT: normocephalic, sclerae anicteric, TMs pearly, nares patent, no discharge or erythema, pharynx normal Oral cavity: MMM, no lesions Neck: supple, no lymphadenopathy, no thyromegaly, no masses Heart: RRR, normal S1, S2, no murmurs Lungs: CTA bilaterally, no wheezes, rhonchi, or rales Abdomen: +bs, soft, non tender, non distended, no masses, no hepatomegaly, no splenomegaly Musculoskeletal: nontender, no swelling, varus deformity, antalgic gait with gait, very slow sitting to standing Extremities: no edema, no cyanosis, no clubbing Pulses: 2+ symmetric, upper and lower extremities, normal cap refill Neurological: alert, oriented x 3, CN2-12 intact, strength normal upper extremities and lower extremities, sensation abnormal bilateral feet, DTRs 2+ throughout, no cerebellar signs Psychiatric: normal affect, behavior normal, pleasant   Medicare Attestation I have personally reviewed: The patient's medical and social history Their use of alcohol, tobacco or illicit drugs Their current medications and supplements The patient's functional ability including ADLs,fall risks, home safety risks, cognitive, and hearing and visual impairment Diet and physical activities Evidence for depression or mood disorders  The patient's weight, height, BMI, and visual acuity have been recorded in the chart.  I have made referrals, counseling, and provided education to the patient based on review of the above and I have provided the patient with a written personalized care plan for preventive services.     Izora Ribas, NP   11/19/2018

## 2018-11-19 ENCOUNTER — Encounter: Payer: Self-pay | Admitting: Adult Health

## 2018-11-19 ENCOUNTER — Other Ambulatory Visit: Payer: Self-pay

## 2018-11-19 ENCOUNTER — Ambulatory Visit (INDEPENDENT_AMBULATORY_CARE_PROVIDER_SITE_OTHER): Payer: Medicare Other | Admitting: Adult Health

## 2018-11-19 VITALS — BP 120/70 | HR 64 | Temp 96.3°F | Ht 61.0 in | Wt 132.0 lb

## 2018-11-19 DIAGNOSIS — R6889 Other general symptoms and signs: Secondary | ICD-10-CM

## 2018-11-19 DIAGNOSIS — I10 Essential (primary) hypertension: Secondary | ICD-10-CM

## 2018-11-19 DIAGNOSIS — E1169 Type 2 diabetes mellitus with other specified complication: Secondary | ICD-10-CM

## 2018-11-19 DIAGNOSIS — Z0000 Encounter for general adult medical examination without abnormal findings: Secondary | ICD-10-CM

## 2018-11-19 DIAGNOSIS — Z9119 Patient's noncompliance with other medical treatment and regimen: Secondary | ICD-10-CM

## 2018-11-19 DIAGNOSIS — Z0001 Encounter for general adult medical examination with abnormal findings: Secondary | ICD-10-CM | POA: Diagnosis not present

## 2018-11-19 DIAGNOSIS — Z87891 Personal history of nicotine dependence: Secondary | ICD-10-CM

## 2018-11-19 DIAGNOSIS — E663 Overweight: Secondary | ICD-10-CM

## 2018-11-19 DIAGNOSIS — D509 Iron deficiency anemia, unspecified: Secondary | ICD-10-CM

## 2018-11-19 DIAGNOSIS — E113293 Type 2 diabetes mellitus with mild nonproliferative diabetic retinopathy without macular edema, bilateral: Secondary | ICD-10-CM

## 2018-11-19 DIAGNOSIS — I6523 Occlusion and stenosis of bilateral carotid arteries: Secondary | ICD-10-CM | POA: Diagnosis not present

## 2018-11-19 DIAGNOSIS — I2510 Atherosclerotic heart disease of native coronary artery without angina pectoris: Secondary | ICD-10-CM | POA: Diagnosis not present

## 2018-11-19 DIAGNOSIS — Z91199 Patient's noncompliance with other medical treatment and regimen due to unspecified reason: Secondary | ICD-10-CM

## 2018-11-19 DIAGNOSIS — E1142 Type 2 diabetes mellitus with diabetic polyneuropathy: Secondary | ICD-10-CM | POA: Diagnosis not present

## 2018-11-19 DIAGNOSIS — E785 Hyperlipidemia, unspecified: Secondary | ICD-10-CM

## 2018-11-19 DIAGNOSIS — E559 Vitamin D deficiency, unspecified: Secondary | ICD-10-CM

## 2018-11-19 DIAGNOSIS — Z23 Encounter for immunization: Secondary | ICD-10-CM

## 2018-11-19 DIAGNOSIS — Z79899 Other long term (current) drug therapy: Secondary | ICD-10-CM | POA: Diagnosis not present

## 2018-11-19 NOTE — Patient Instructions (Addendum)
William Haney , Thank you for taking time to come for your Medicare Wellness Visit. I appreciate your ongoing commitment to your health goals. Please review the following plan we discussed and let me know if I can assist you in the future.   These are the goals we discussed: Goals    . Blood Pressure < 130/80    . HEMOGLOBIN A1C < 7.0    . LDL CALC < 70       This is a list of the screening recommended for you and due dates:  Health Maintenance  Topic Date Due  . Eye exam for diabetics  04/14/2018  . Flu Shot  10/10/2018  . Complete foot exam   11/05/2018  . Hemoglobin A1C  04/03/2019  . Colon Cancer Screening  08/22/2022  . Tetanus Vaccine  02/06/2025  .  Hepatitis C: One time screening is recommended by Center for Disease Control  (CDC) for  adults born from [birth year] through 1967.   Completed  . Pneumonia vaccines  Completed     Take invokana OR jardiance - 1 tab daily   Check jardiance website for discount coupon   (Invokana) Canagliflozin oral tablets What is this medicine? CANAGLIFLOZIN (KAN a gli FLOE zin) helps to treat type 2 diabetes. It helps to control blood sugar. Treatment is combined with diet and exercise. This drug may also reduce the risk of heart attack, stroke, or death if you have type 2 diabetes and risk factors for heart disease. If you have diabetic kidney disease with a certain amount of protein in the urine, this drug may reduce your risk of end stage kidney disease (ESKD), worsened kidney function, and hospitalization for heart failure. This medicine may be used for other purposes; ask your health care provider or pharmacist if you have questions. COMMON BRAND NAME(S): Invokana What should I tell my health care provider before I take this medicine? They need to know if you have any of these conditions:  artery disease  dehydration  diabetic ketoacidosis  diet low in salt  eating less due to illness, surgery, dieting, or any other reason  foot sores   having surgery  high cholesterol  high levels of potassium in the blood  history of amputation  history of pancreatitis or pancreas problems  history of yeast infection of the penis or vagina  if you often drink alcohol  infections in the bladder, kidneys, or urinary tract  kidney disease  liver disease  low blood pressure  nerve damage  on hemodialysis  problems urinating  type 1 diabetes  uncircumcised male  an unusual or allergic reaction to canagliflozin, other medicines, foods, dyes, or preservatives  pregnant or trying to get pregnant  breast-feeding How should I use this medicine? Take this medicine by mouth with a glass of water. Follow the directions on the prescription label. Take it before the first meal of the day. Take your dose at the same time each day. Do not take more often than directed. Do not stop taking except on your doctor's advice. A special MedGuide will be given to you by the pharmacist with each prescription and refill. Be sure to read this information carefully each time. Talk to your pediatrician regarding the use of this medicine in children. Special care may be needed. Overdosage: If you think you have taken too much of this medicine contact a poison control center or emergency room at once. NOTE: This medicine is only for you. Do not share this medicine  with others. What if I miss a dose? If you miss a dose, take it as soon as you can. If it is almost time for your next dose, take only that dose. Do not take double or extra doses. What may interact with this medicine? Do not take this medicine with any of the following medications:  gatifloxacin This medicine may also interact with the following medications:  alcohol  certain medicines for blood pressure, heart disease  digoxin  diuretics  insulin  nateglinide  phenobarbital  phenytoin  repaglinide  rifampin  ritonavir  sulfonylureas like glimepiride,  glipizide, glyburide This list may not describe all possible interactions. Give your health care provider a list of all the medicines, herbs, non-prescription drugs, or dietary supplements you use. Also tell them if you smoke, drink alcohol, or use illegal drugs. Some items may interact with your medicine. What should I watch for while using this medicine? Visit your doctor or health care professional for regular checks on your progress. This medicine can cause a serious condition in which there is too much acid in the blood. If you develop nausea, vomiting, stomach pain, unusual tiredness, or breathing problems, stop taking this medicine and call your doctor right away. If possible, use a ketone dipstick to check for ketones in your urine. A test called the HbA1C (A1C) will be monitored. This is a simple blood test. It measures your blood sugar control over the last 2 to 3 months. You will receive this test every 3 to 6 months. Learn how to check your blood sugar. Learn the symptoms of low and high blood sugar and how to manage them. Always carry a quick-source of sugar with you in case you have symptoms of low blood sugar. Examples include hard sugar candy or glucose tablets. Make sure others know that you can choke if you eat or drink when you develop serious symptoms of low blood sugar, such as seizures or unconsciousness. They must get medical help at once. Tell your doctor or health care professional if you have high blood sugar. You might need to change the dose of your medicine. If you are sick or exercising more than usual, you might need to change the dose of your medicine. Do not skip meals. Ask your doctor or health care professional if you should avoid alcohol. Many nonprescription cough and cold products contain sugar or alcohol. These can affect blood sugar. Wear a medical ID bracelet or chain, and carry a card that describes your disease and details of your medicine and dosage times. What  side effects may I notice from receiving this medicine? Side effects that you should report to your doctor or health care professional as soon as possible:  allergic reactions like skin rash, itching or hives, swelling of the face, lips, or tongue  breathing problems  chest pain  dizziness  feeling faint or lightheaded, falls  muscle weakness  nausea, vomiting, unusual stomach upset or pain  new pain or tenderness, change in skin color, sores or ulcers, or infection in legs or feet  penile discharge, itching, or pain in men  signs and symptoms of a genital infection, such as fever; tenderness, redness, or swelling in the genitals or area from the genitals to the back of the rectum  signs and symptoms of low blood sugar such as feeling anxious, confusion, dizziness, increased hunger, unusually weak or tired, sweating, shakiness, cold, irritable, headache, blurred vision, fast heartbeat, loss of consciousness  signs and symptoms of a urinary  tract infection, such as fever, chills, a burning feeling when urinating, blood in the urine, back pain  tingling or numbness in the hands, legs, or feet  trouble passing urine or change in the amount of urine, including an urgent need to urinate more often, in larger amounts, or at night  unusual tiredness  vaginal discharge, itching, or odor in women Side effects that usually do not require medical attention (report to your doctor or health care professional if they continue or are bothersome):  mild increase in urination  thirsty This list may not describe all possible side effects. Call your doctor for medical advice about side effects. You may report side effects to FDA at 1-800-FDA-1088. Where should I keep my medicine? Keep out of the reach of children. Store at room temperature between 20 and 25 degrees C (68 and 77 degrees F). Throw away any unused medicine after the expiration date. NOTE: This sheet is a summary. It may not  cover all possible information. If you have questions about this medicine, talk to your doctor, pharmacist, or health care provider.  2020 Elsevier/Gold Standard (2017-12-11 09:42:33)     Lung Cancer Screening A lung cancer screening is a test that checks for lung cancer. Lung cancer screening is done to look for lung cancer in its very early stages, before it spreads and becomes harder to treat and before symptoms appear. Finding cancer early improves the chances of successful treatment. It may save your life. Should I be screened for lung cancer? You should be screened for lung cancer if all of these apply:  You currently smoke or you have quit smoking within the past 15 years.  You are 73-7 years old. Screening may be recommended up to age 42 depending on your overall health and other factors.  You are in good general health.  You have a smoking history of 1 pack a day for 30 years or 2 packs a day for 15 years. Screening may also be recommended if you are at high risk for the disease. You may be at high risk if:  You have a family history of lung cancer.  You have been exposed to asbestos.  You have chronic obstructive pulmonary disease (COPD).  You have a history of previous lung cancer. How often should I be screened for lung cancer?  If you are at risk for lung cancer, it is recommended that you are screened once a year. The recommended screening test is a low-dose CT scan. How can I lower my risk of lung cancer? To lower your risk of developing lung cancer:  If you smoke, stop smoking all tobacco products.  Avoid secondhand smoke.  Avoid exposure to radiation.  Avoid exposure to radon gas. Have your home checked for radon regularly.  Avoid things that cause cancer (carcinogens).  Avoid living or working in places with high air pollution. Where to find more information Ask your health care provider about the risks and benefits of screening. More information and  resources are available from these organizations:  [place] (ACS): www.cancer.org  American Lung Association: www.lung.org Contact a health care provider if:  You start to show symptoms of lung cancer, including: ? Coughing that will not go away. ? Wheezing. ? Chest pain. ? Coughing up blood. ? Shortness of breath. ? Weight loss that cannot be explained. ? Constant fatigue. Summary  Lung cancer screening may find lung cancer before symptoms appear. Finding cancer early improves the chances of successful treatment. It  may save your life.  If you are at risk for lung cancer, it is recommended that you are screened once a year. The recommended screening test is a low-dose CT scan.  You can make lifestyle changes to lower your risk of lung cancer.  Ask your health care provider about the risks and benefits of screening. This information is not intended to replace advice given to you by your health care provider. Make sure you discuss any questions you have with your health care provider. Document Released: 01/17/2016 Document Revised: 06/19/2018 Document Reviewed: 01/17/2016 Elsevier Patient Education  2020 [employer].

## 2018-11-19 NOTE — Progress Notes (Signed)
Patient presents to the office for HD Flu Vaccine. Vaccine administered to Left Deltoid withoutanycomplication. Temperature taken and recorded 

## 2018-11-20 LAB — LIPID PANEL
Cholesterol: 98 mg/dL (ref <200)
HDL: 43 mg/dL (ref >=40)
LDL Cholesterol (Calc): 40 mg/dL (calc)
Non-HDL Cholesterol (Calc): 55 mg/dL (calc) (ref <130)
Total CHOL/HDL Ratio: 2.3 (calc) (ref <5.0)
Triglycerides: 71 mg/dL (ref <150)

## 2018-11-20 LAB — COMPLETE METABOLIC PANEL WITH GFR
AG Ratio: 1.8 (calc) (ref 1.0–2.5)
ALT: 21 U/L (ref 9–46)
AST: 21 U/L (ref 10–35)
Albumin: 4.4 g/dL (ref 3.6–5.1)
Alkaline phosphatase (APISO): 50 U/L (ref 35–144)
BUN: 17 mg/dL (ref 7–25)
CO2: 29 mmol/L (ref 20–32)
Calcium: 9.8 mg/dL (ref 8.6–10.3)
Chloride: 100 mmol/L (ref 98–110)
Creat: 1.05 mg/dL (ref 0.70–1.18)
GFR, Est African American: 83 mL/min/{1.73_m2} (ref >=60)
GFR, Est Non African American: 72 mL/min/{1.73_m2} (ref >=60)
Globulin: 2.4 g/dL (calc) (ref 1.9–3.7)
Glucose, Bld: 126 mg/dL — ABNORMAL HIGH (ref 65–99)
Potassium: 5.5 mmol/L — ABNORMAL HIGH (ref 3.5–5.3)
Sodium: 136 mmol/L (ref 135–146)
Total Bilirubin: 0.6 mg/dL (ref 0.2–1.2)
Total Protein: 6.8 g/dL (ref 6.1–8.1)

## 2018-11-20 LAB — CBC WITH DIFFERENTIAL/PLATELET
Absolute Monocytes: 704 cells/uL (ref 200–950)
Basophils Absolute: 69 cells/uL (ref 0–200)
Basophils Relative: 1 %
Eosinophils Absolute: 614 cells/uL — ABNORMAL HIGH (ref 15–500)
Eosinophils Relative: 8.9 %
HCT: 42.6 % (ref 38.5–50.0)
Hemoglobin: 12.5 g/dL — ABNORMAL LOW (ref 13.2–17.1)
Lymphs Abs: 1663 cells/uL (ref 850–3900)
MCH: 20.3 pg — ABNORMAL LOW (ref 27.0–33.0)
MCHC: 29.3 g/dL — ABNORMAL LOW (ref 32.0–36.0)
MCV: 69.2 fL — ABNORMAL LOW (ref 80.0–100.0)
Monocytes Relative: 10.2 %
Neutro Abs: 3850 cells/uL (ref 1500–7800)
Neutrophils Relative %: 55.8 %
Platelets: 321 10*3/uL (ref 140–400)
RBC: 6.16 10*6/uL — ABNORMAL HIGH (ref 4.20–5.80)
RDW: 17.9 % — ABNORMAL HIGH (ref 11.0–15.0)
Total Lymphocyte: 24.1 %
WBC: 6.9 10*3/uL (ref 3.8–10.8)

## 2018-11-20 LAB — HEMOGLOBIN A1C
Hgb A1c MFr Bld: 7.3 % of total Hgb — ABNORMAL HIGH (ref <5.7)
Mean Plasma Glucose: 163 (calc)
eAG (mmol/L): 9 (calc)

## 2018-11-20 LAB — TSH: TSH: 2.33 mIU/L (ref 0.40–4.50)

## 2018-11-20 LAB — MAGNESIUM: Magnesium: 2.1 mg/dL (ref 1.5–2.5)

## 2018-11-23 ENCOUNTER — Encounter: Payer: Self-pay | Admitting: Adult Health

## 2018-12-03 DIAGNOSIS — M25562 Pain in left knee: Secondary | ICD-10-CM | POA: Diagnosis not present

## 2018-12-03 DIAGNOSIS — M1712 Unilateral primary osteoarthritis, left knee: Secondary | ICD-10-CM | POA: Diagnosis not present

## 2018-12-10 ENCOUNTER — Other Ambulatory Visit: Payer: Self-pay | Admitting: Orthopedic Surgery

## 2018-12-11 ENCOUNTER — Other Ambulatory Visit: Payer: Self-pay | Admitting: Orthopedic Surgery

## 2018-12-11 ENCOUNTER — Telehealth: Payer: Self-pay | Admitting: *Deleted

## 2018-12-11 NOTE — Telephone Encounter (Signed)
LVM to call  Research at [phone] for Coordinate DM study 6 month follow up call.

## 2018-12-14 NOTE — Patient Instructions (Addendum)
DUE TO COVID-19 ONLY ONE VISITOR IS ALLOWED TO COME WITH YOU AND STAY IN THE WAITING ROOM ONLY DURING PRE OP AND PROCEDURE DAY OF SURGERY. THE 1 VISITOR MAY VISIT WITH YOU AFTER SURGERY IN YOUR PRIVATE ROOM DURING VISITING HOURS ONLY!  YOU NEED TO HAVE A COVID 19 TEST ON__10/6_____ @_______ , THIS TEST MUST BE DONE BEFORE SURGERY, COME  William Haney , [id number].  ([place]) ONCE YOUR COVID TEST IS COMPLETED, PLEASE BEGIN THE QUARANTINE INSTRUCTIONS AS OUTLINED IN YOUR HANDOUT.                William Haney    Your procedure is scheduled on: 12/18/18   Report to [facility] Main  Entrance   Report to admitting at  10:15 AM     Call this number if you have problems the morning of surgery [place], NO CHEWING GUM [place].   Do not eat food After Midnight.    YOU MAY HAVE CLEAR LIQUIDS FROM MIDNIGHT UNTIL 9:45 AM.   CLEAR LIQUID DIET  Foods Allowed                                                                     Foods Excluded  Water, Black Coffee and tea, regular and decaf                             liquids that you cannot  Plain Jell-O in any flavor  (No red)                                           see through such as: Fruit ices (not with fruit pulp)                                     milk, soups, orange juice  Iced Popsicles (No red)                                    All solid food Carbonated beverages, regular and diet                                    Apple juices Sports drinks like Gatorade (No red) Lightly seasoned clear broth or consume(fat free) Sugar, honey syrup    At 9:45 AM Please finish the prescribed Pre-Surgery Gatorade drink.   Nothing by mouth after you finish the Gatorade drink !    Take these medicines the morning of surgery with A SIP OF WATER: Metoprolol   How to Manage Your Diabetes Before and After Surgery  Why is it  important to control my blood sugar before and after surgery? . Improving blood sugar levels before and after surgery helps healing and can limit problems. . A way  of improving blood sugar control is eating a healthy diet by: o  Eating less sugar and carbohydrates o  Increasing activity/exercise o  Talking with your doctor about reaching your blood sugar goals . High blood sugars (greater than 180 mg/dL) can raise your risk of infections and slow your recovery, so you will need to focus on controlling your diabetes during the weeks before surgery. . Make sure that the doctor who takes care of your diabetes knows about your planned surgery including the date and location.  How do I manage my blood sugar before surgery? . Check your blood sugar at least 4 times a day, starting 2 days before surgery, to make sure that the level is not too high or low. o Check your blood sugar the morning of your surgery when you wake up and every 2 hours until you get to the Short Stay unit. . If your blood sugar is less than 70 mg/dL, you will need to treat for low blood sugar: o Do not take insulin. o Treat a low blood sugar (less than 70 mg/dL) with  cup of clear juice (cranberry or apple), 4 glucose tablets, OR glucose gel. o Recheck blood sugar in 15 minutes after treatment (to make sure it is greater than 70 mg/dL). If your blood sugar is not greater than 70 mg/dL on recheck, call [phone] for further instructions. . Report your blood sugar to the short stay nurse when you get to Short Stay.  . If you are admitted to the hospital after surgery: o Your blood sugar will be checked by the staff and you will probably be given insulin after surgery (instead of oral diabetes medicines) to make sure you have good blood sugar levels. o The goal for blood sugar control after surgery is 80-180 mg/dL.   WHAT DO I DO ABOUT MY DIABETES MEDICATION?  [name] Do not take oral diabetes medicines (pills) the morning of  surgery.  DO NOT TAKE ANY DIABETIC MEDICATIONS DAY OF YOUR SURGERY                               You may not have any metal on your body including piercings            Do not wear jewelry,  lotions, powders or deodorant                      Men may shave face and neck.   Do not bring valuables to the hospital. [place].  Contacts, dentures or bridgework may not be worn into surgery.        Name and phone number of your driver:  Special Instructions: N/A              Please read over the following fact sheets you were given: _____________________________________________________________________             [facility] - Preparing for Surgery  Before surgery, you can play an important role.   Because skin is not sterile, your skin needs to be as free of germs as possible.   You can reduce the number of germs on your skin by washing with CHG (chlorahexidine gluconate) soap before surgery.   CHG is an antiseptic cleaner which kills germs and bonds with the skin to continue killing germs  even after washing. Please DO NOT use if you have an allergy to CHG or antibacterial soaps .  If your skin becomes reddened/irritated stop using the CHG and inform your nurse when you arrive at Short Stay.   You may shave your face/neck.  Please follow these instructions carefully:  1.  Shower with CHG Soap the night before surgery and the  morning of Surgery.  2.  If you choose to wash your hair, wash your hair first as usual with your  normal  shampoo.  3.  After you shampoo, rinse your hair and body thoroughly to remove the  shampoo.                                        4.  Use CHG as you would any other liquid soap.  You can apply chg directly  to the skin and wash                       Gently with a scrungie or clean washcloth.  5.  Apply the CHG Soap to your body ONLY FROM THE NECK DOWN.   Do not use on face/ open                            Wound or open sores. Avoid contact with eyes, ears mouth and genitals (private parts).                       Wash face,  Genitals (private parts) with your normal soap.             6.  Wash thoroughly, paying special attention to the area where your surgery  will be performed.  7.  Thoroughly rinse your body with warm water from the neck down.  8.  DO NOT shower/wash with your normal soap after using and rinsing off  the CHG Soap.                9.  Pat yourself dry with a clean towel.            10.  Wear clean pajamas.            11.  Place clean sheets on your bed the night of your first shower and do not  sleep with pets. Day of Surgery : Do not apply any lotions/deodorants the morning of surgery.  Please wear clean clothes to the hospital/surgery center.  FAILURE TO FOLLOW THESE INSTRUCTIONS MAY RESULT IN THE CANCELLATION OF YOUR SURGERY PATIENT SIGNATURE_________________________________  NURSE SIGNATURE__________________________________  ________________________________________________________________________   Adam Phenix  An incentive spirometer is a tool that can help keep your lungs clear and active. This tool measures how well you are filling your lungs with each breath. Taking long deep breaths may help reverse or decrease the chance of developing breathing (pulmonary) problems (especially infection) following:  A long period of time when you are unable to move or be active. BEFORE THE PROCEDURE   If the spirometer includes an indicator to show your best effort, your nurse or respiratory therapist will set it to a desired goal.  If possible, sit up straight or lean slightly forward. Try not to slouch.  Hold the incentive spirometer in an upright position. INSTRUCTIONS FOR USE  1. Sit on the edge of your  bed if possible, or sit up as far as you can in bed or on a chair. 2. Hold the incentive spirometer in an upright position. 3. Breathe out normally. 4. Place  the mouthpiece in your mouth and seal your lips tightly around it. 5. Breathe in slowly and as deeply as possible, raising the piston or the ball toward the top of the column. 6. Hold your breath for 3-5 seconds or for as long as possible. Allow the piston or ball to fall to the bottom of the column. 7. Remove the mouthpiece from your mouth and breathe out normally. 8. Rest for a few seconds and repeat Steps 1 through 7 at least 10 times every 1-2 hours when you are awake. Take your time and take a few normal breaths between deep breaths. 9. The spirometer may include an indicator to show your best effort. Use the indicator as a goal to work toward during each repetition. 10. After each set of 10 deep breaths, practice coughing to be sure your lungs are clear. If you have an incision (the cut made at the time of surgery), support your incision when coughing by placing a pillow or rolled up towels firmly against it. Once you are able to get out of bed, walk around indoors and cough well. You may stop using the incentive spirometer when instructed by your caregiver.  RISKS AND COMPLICATIONS  Take your time so you do not get dizzy or light-headed.  If you are in pain, you may need to take or ask for pain medication before doing incentive spirometry. It is harder to take a deep breath if you are having pain. AFTER USE  Rest and breathe slowly and easily.  It can be helpful to keep track of a log of your progress. Your caregiver can provide you with a simple table to help with this. If you are using the spirometer at home, follow these instructions: [place] IF:   You are having difficultly using the spirometer.  You have trouble using the spirometer as often as instructed.  Your pain medication is not giving enough relief while using the spirometer.  You develop fever of 100.5 F (38.1 C) or higher. SEEK IMMEDIATE MEDICAL CARE IF:   You cough up bloody sputum that had not been  present before.  You develop fever of 102 F (38.9 C) or greater.  You develop worsening pain at or near the incision site. MAKE SURE YOU:   Understand these instructions.  Will watch your condition.  Will get help right away if you are not doing well or get worse. Document Released: 07/08/2006 Document Revised: 05/20/2011 Document Reviewed: 09/08/2006 [facility] Patient Information 2014 [place], [place].   ________________________________________________________________________

## 2018-12-15 ENCOUNTER — Other Ambulatory Visit (HOSPITAL_COMMUNITY)
Admission: RE | Admit: 2018-12-15 | Discharge: 2018-12-15 | Disposition: A | Discharge status: Home / Self Care | Payer: Medicare Other | Source: Ambulatory Visit | Attending: Orthopedic Surgery | Admitting: Orthopedic Surgery

## 2018-12-15 ENCOUNTER — Other Ambulatory Visit: Payer: Self-pay

## 2018-12-15 ENCOUNTER — Ambulatory Visit (HOSPITAL_COMMUNITY)
Admission: RE | Admit: 2018-12-15 | Discharge: 2018-12-15 | Disposition: A | Discharge status: Home / Self Care | Payer: Medicare Other | Source: Ambulatory Visit | Attending: Orthopedic Surgery | Admitting: Orthopedic Surgery

## 2018-12-15 ENCOUNTER — Encounter (HOSPITAL_COMMUNITY): Payer: Self-pay

## 2018-12-15 ENCOUNTER — Encounter (INDEPENDENT_AMBULATORY_CARE_PROVIDER_SITE_OTHER): Payer: Self-pay

## 2018-12-15 ENCOUNTER — Encounter (HOSPITAL_COMMUNITY)
Admission: RE | Admit: 2018-12-15 | Discharge: 2018-12-15 | Disposition: A | Discharge status: Home / Self Care | Payer: Medicare Other | Source: Ambulatory Visit | Attending: Orthopedic Surgery | Admitting: Orthopedic Surgery

## 2018-12-15 DIAGNOSIS — Z01811 Encounter for preprocedural respiratory examination: Secondary | ICD-10-CM

## 2018-12-15 DIAGNOSIS — Z20828 Contact with and (suspected) exposure to other viral communicable diseases: Secondary | ICD-10-CM | POA: Diagnosis not present

## 2018-12-15 DIAGNOSIS — Z01818 Encounter for other preprocedural examination: Secondary | ICD-10-CM | POA: Diagnosis not present

## 2018-12-15 DIAGNOSIS — M1712 Unilateral primary osteoarthritis, left knee: Secondary | ICD-10-CM | POA: Insufficient documentation

## 2018-12-15 LAB — URINALYSIS, ROUTINE W REFLEX MICROSCOPIC
Bilirubin Urine: NEGATIVE
Glucose, UA: >=500 mg/dL — AB
Hgb urine dipstick: NEGATIVE
Ketones, ur: NEGATIVE mg/dL
Leukocytes,Ua: NEGATIVE
Nitrite: NEGATIVE
Protein, ur: NEGATIVE mg/dL
Specific Gravity, Urine: 1.021 (ref 1.005–1.030)
pH: 5 (ref 5.0–8.0)

## 2018-12-15 LAB — CBC WITH DIFFERENTIAL/PLATELET
Abs Immature Granulocytes: 0.04 10*3/uL (ref 0.00–0.07)
Basophils Absolute: 0.1 10*3/uL (ref 0.0–0.1)
Basophils Relative: 1 %
Eosinophils Absolute: 0.6 10*3/uL — ABNORMAL HIGH (ref 0.0–0.5)
Eosinophils Relative: 8 %
HCT: 42.1 % (ref 39.0–52.0)
Hemoglobin: 12.7 g/dL — ABNORMAL LOW (ref 13.0–17.0)
Immature Granulocytes: 1 %
Lymphocytes Relative: 17 %
Lymphs Abs: 1.3 10*3/uL (ref 0.7–4.0)
MCH: 20.3 pg — ABNORMAL LOW (ref 26.0–34.0)
MCHC: 30.2 g/dL (ref 30.0–36.0)
MCV: 67.4 fL — ABNORMAL LOW (ref 80.0–100.0)
Monocytes Absolute: 0.6 10*3/uL (ref 0.1–1.0)
Monocytes Relative: 8 %
Neutro Abs: 5 10*3/uL (ref 1.7–7.7)
Neutrophils Relative %: 65 %
Platelets: 258 10*3/uL (ref 150–400)
RBC: 6.25 MIL/uL — ABNORMAL HIGH (ref 4.22–5.81)
RDW: 18.1 % — ABNORMAL HIGH (ref 11.5–15.5)
WBC: 7.6 10*3/uL (ref 4.0–10.5)
nRBC: 0 % (ref 0.0–0.2)

## 2018-12-15 LAB — COMPREHENSIVE METABOLIC PANEL
ALT: 26 U/L (ref 0–44)
AST: 22 U/L (ref 15–41)
Albumin: 4.4 g/dL (ref 3.5–5.0)
Alkaline Phosphatase: 55 U/L (ref 38–126)
Anion gap: 7 (ref 5–15)
BUN: 20 mg/dL (ref 8–23)
CO2: 26 mmol/L (ref 22–32)
Calcium: 9.5 mg/dL (ref 8.9–10.3)
Chloride: 103 mmol/L (ref 98–111)
Creatinine, Ser: 1 mg/dL (ref 0.61–1.24)
GFR calc Af Amer: >60 mL/min (ref >60)
GFR calc non Af Amer: >60 mL/min (ref >60)
Glucose, Bld: 149 mg/dL — ABNORMAL HIGH (ref 70–99)
Potassium: 5.7 mmol/L — ABNORMAL HIGH (ref 3.5–5.1)
Sodium: 136 mmol/L (ref 135–145)
Total Bilirubin: 0.8 mg/dL (ref 0.3–1.2)
Total Protein: 7 g/dL (ref 6.5–8.1)

## 2018-12-15 LAB — HEMOGLOBIN A1C
Hgb A1c MFr Bld: 7.4 % — ABNORMAL HIGH (ref 4.8–5.6)
Mean Plasma Glucose: 165.68 mg/dL

## 2018-12-15 LAB — GLUCOSE, CAPILLARY: Glucose-Capillary: 168 mg/dL — ABNORMAL HIGH (ref 70–99)

## 2018-12-15 LAB — PROTIME-INR
INR: 0.9 (ref 0.8–1.2)
Prothrombin Time: 12.5 seconds (ref 11.4–15.2)

## 2018-12-15 LAB — SURGICAL PCR SCREEN
MRSA, PCR: NEGATIVE
Staphylococcus aureus: NEGATIVE

## 2018-12-15 LAB — ABO/RH: ABO/RH(D): O POS

## 2018-12-15 LAB — APTT: aPTT: 30 seconds (ref 24–36)

## 2018-12-15 NOTE — Progress Notes (Signed)
PCP - Dr. Melford Aase Cardiologist - Dr. Lenna Sciara. [place]  Chest x-ray - 12/15/18 EKG - 05/07/18 Stress Test - no ECHO - no Cardiac Cath - no , CABG 2008  Sleep Study - no CPAP -   Fasting Blood Sugar - 119-125 Checks Blood Sugar __TID___ times a day  Blood Thinner Instructions:none received from surgeon or MD Aspirin Instructions: Last Dose:10/5  Anesthesia review:   Patient denies shortness of breath, fever, cough and chest pain at PAT appointment yes Patient verbalized understanding of instructions that were given to them at the PAT appointment. Patient was also instructed that they will need to review over the PAT instructions again at home before surgery. Yes

## 2018-12-16 ENCOUNTER — Other Ambulatory Visit: Payer: Self-pay | Admitting: Orthopedic Surgery

## 2018-12-16 LAB — NOVEL CORONAVIRUS, NAA (HOSP ORDER, SEND-OUT TO REF LAB; TAT 18-24 HRS): SARS-CoV-2, NAA: NOT DETECTED

## 2018-12-16 NOTE — Progress Notes (Addendum)
Anesthesia Chart Review   Case: [id number] Date/Time: 12/18/18 1232   Procedure: TOTAL KNEE ARTHROPLASTY (Left Knee)   Anesthesia type: Spinal   Pre-op diagnosis: OSTEOARTHRITIS LEFT KNEE   Location: [place] 08 / [facility] ORS   Surgeon: Dorna Leitz, MD      DISCUSSION:68 y.o. former smoker (34 pack years, quit 08/28/05) with h/o HTN, DM II, HLD, CAD (CABG 2008), left knee OA scheduled for above procedure 12/18/2018 with Dr. Dorna Leitz.   Pt cleared 08/14/2018 by cardiology for planned procedure.  Per Kerin Ransom, PA-C, "Given past medical history and time since last visit, based on ACC/AHA guidelines, William Haney would be at acceptable risk for the planned procedure without further cardiovascular testing. OK to hold aspirin 3-5 days pre op if needed." Stable since June.  Surgery rescheduled due to A1C.  A1C is now 7.4.   K 5.7 at PAT, forwarded to surgeon.  Will repeat DOS.   Anticipate pt can proceed with planned procedure barring acute status change.   VS: BP 129/81   Pulse 88   Temp 36.9 C (Oral)   Resp 16   Ht '5\' 1"'$  (1.549 m)   Wt 59.4 kg   SpO2 99%   BMI 24.75 kg/m   PROVIDERS: Unk Pinto, MD is PCP   Minus Breeding, MD is Cardiologist  LABS: Labs reviewed: Acceptable for surgery. (all labs ordered are listed, but only abnormal results are displayed)  Labs Reviewed  CBC WITH DIFFERENTIAL/PLATELET - Abnormal; Notable for the following components:      Result Value   RBC 6.25 (*)    Hemoglobin 12.7 (*)    MCV 67.4 (*)    MCH 20.3 (*)    RDW 18.1 (*)    Eosinophils Absolute 0.6 (*)    All other components within normal limits  COMPREHENSIVE METABOLIC PANEL - Abnormal; Notable for the following components:   Potassium 5.7 (*)    Glucose, Bld 149 (*)    All other components within normal limits  URINALYSIS, ROUTINE W REFLEX MICROSCOPIC - Abnormal; Notable for the following components:   Glucose, UA >=500 (*)    Bacteria, UA RARE (*)    All other components within  normal limits  GLUCOSE, CAPILLARY - Abnormal; Notable for the following components:   Glucose-Capillary 168 (*)    All other components within normal limits  HEMOGLOBIN A1C - Abnormal; Notable for the following components:   Hgb A1c MFr Bld 7.4 (*)    All other components within normal limits  SURGICAL PCR SCREEN  APTT  PROTIME-INR  TYPE AND SCREEN  ABO/RH     IMAGES: Chest Xray 12/15/2018 FINDINGS: There is no focal consolidation. There is no pleural effusion or pneumothorax. The heart and mediastinal contours are unremarkable. There is evidence of prior CABG.  There is no acute osseous abnormality.  IMPRESSION: No active cardiopulmonary disease.   EKG: 05/07/2018 Rate 75 bpm Normal sinus rhythm  Nonspecific T wave abnormality   CV: Echo 01/28/08 SUMMARY  - LVEF is approximately 55% with hypokinesis/akinesis of the     posterior (base, mid, distal), basal inferior, basal     inferoseptal wall. Left ventricular wall thickness was mildly     increased.  Past Medical History:  Diagnosis Date  . Anemia   . Carotid artery stenosis 01/2017   L-ICA 1-39%, R-ICA 40-59% doppler  . Coronary artery disease    s/p CABG in 2008, w/ LIMA-LAD, SVG-Diag, SVG-OM, SVG-PL  . Diabetes mellitus type II   .  Hyperlipidemia   . Hypertension   . Vitamin D deficiency     Past Surgical History:  Procedure Laterality Date  . CORONARY ARTERY BYPASS GRAFT  2008   LIMA to the LAD, saphenous vein graft to diagonal, saphenous vein graft to OM, saphenous vein graft to posterolateral 2008    MEDICATIONS: . aspirin 81 MG tablet  . blood glucose meter kit and supplies  . Cholecalciferol (VITAMIN D3) 125 MCG (5000 UT) CAPS  . Cinnamon 500 MG capsule  . empagliflozin (JARDIANCE) 10 MG TABS tablet  . empagliflozin (JARDIANCE) 25 MG TABS tablet  . glimepiride (AMARYL) 4 MG tablet  . Insulin Degludec (TRESIBA) 100 UNIT/ML SOLN  . Insulin Detemir (LEVEMIR) 100 UNIT/ML Pen   . metFORMIN (GLUCOPHAGE) 500 MG tablet  . metoprolol tartrate (LOPRESSOR) 25 MG tablet  . Multiple Vitamin (MULTIVITAMIN) tablet  . NOVOFINE 32G X 6 MM MISC  . rosuvastatin (CRESTOR) 20 MG tablet  . telmisartan (MICARDIS) 20 MG tablet   No current facility-administered medications for this encounter.      Maia Plan [facility] Pre-Surgical Testing [phone] 12/16/18  4:35 PM

## 2018-12-16 NOTE — Anesthesia Preprocedure Evaluation (Addendum)
Anesthesia Evaluation  Patient identified by MRN, date of birth, ID band Patient awake    Reviewed: Allergy & Precautions, H&P , NPO status , Patient's Chart, lab work & pertinent test results, reviewed documented beta blocker date and time   Airway Mallampati: II  TM Distance: >3 FB Neck ROM: Full    Dental no notable dental hx. (+) Partial Upper, Partial Lower, Dental Advisory Given   Pulmonary neg pulmonary ROS, former smoker,    Pulmonary exam normal breath sounds clear to auscultation       Cardiovascular hypertension, Pt. on medications and Pt. on home beta blockers + CAD and + CABG   Rhythm:Regular Rate:Normal     Neuro/Psych negative neurological ROS  negative psych ROS   GI/Hepatic negative GI ROS, Neg liver ROS,   Endo/Other  diabetes, Insulin Dependent, Oral Hypoglycemic Agents  Renal/GU negative Renal ROS  negative genitourinary   Musculoskeletal   Abdominal   Peds  Hematology  (+) Blood dyscrasia, anemia ,   Anesthesia Other Findings   Reproductive/Obstetrics negative OB ROS                           Anesthesia Physical Anesthesia Plan  ASA: III  Anesthesia Plan: Spinal   Post-op Pain Management:  Regional for Post-op pain   Induction: Intravenous  PONV Risk Score and Plan: 2 and Ondansetron, Propofol infusion and Midazolam  Airway Management Planned: Simple Face Mask  Additional Equipment:   Intra-op Plan:   Post-operative Plan:   Informed Consent: I have reviewed the patients History and Physical, chart, labs and discussed the procedure including the risks, benefits and alternatives for the proposed anesthesia with the patient or authorized representative who has indicated his/her understanding and acceptance.     Dental advisory given  Plan Discussed with: CRNA  Anesthesia Plan Comments: (See PAT note 12/15/2018, Konrad Felix, PA-C)        Anesthesia Quick Evaluation

## 2018-12-16 NOTE — Care Plan (Signed)
Spoke with patient prior to surgery. He plans to discharge to home with family and HHPT. Referral to [facility]. Rolling walker and CPM ordered. Patient and MD in agreement with plan. Choice offered.    William Haney, [place]

## 2018-12-17 MED ORDER — BUPIVACAINE LIPOSOME 1.3 % IJ SUSP
20.0000 mL | INTRAMUSCULAR | Status: DC
  Filled 2018-12-17: qty 20

## 2018-12-18 ENCOUNTER — Ambulatory Visit (HOSPITAL_COMMUNITY): Payer: Medicare Other | Admitting: Physician Assistant

## 2018-12-18 ENCOUNTER — Ambulatory Visit (HOSPITAL_COMMUNITY): Payer: Medicare Other | Admitting: Anesthesiology

## 2018-12-18 ENCOUNTER — Ambulatory Visit (HOSPITAL_COMMUNITY)
Admission: RE | Admit: 2018-12-18 | Discharge: 2018-12-19 | Disposition: A | Discharge status: Home / Self Care | Payer: Medicare Other | Attending: Orthopedic Surgery | Admitting: Orthopedic Surgery

## 2018-12-18 ENCOUNTER — Encounter (HOSPITAL_COMMUNITY)
Admission: RE | Disposition: A | Discharge status: Home / Self Care | Payer: Self-pay | Source: Home / Self Care | Attending: Orthopedic Surgery

## 2018-12-18 ENCOUNTER — Other Ambulatory Visit: Payer: Self-pay

## 2018-12-18 ENCOUNTER — Encounter (HOSPITAL_COMMUNITY): Payer: Self-pay | Admitting: *Deleted

## 2018-12-18 DIAGNOSIS — Z87891 Personal history of nicotine dependence: Secondary | ICD-10-CM | POA: Diagnosis not present

## 2018-12-18 DIAGNOSIS — E559 Vitamin D deficiency, unspecified: Secondary | ICD-10-CM | POA: Diagnosis not present

## 2018-12-18 DIAGNOSIS — M1712 Unilateral primary osteoarthritis, left knee: Secondary | ICD-10-CM | POA: Insufficient documentation

## 2018-12-18 DIAGNOSIS — Z7982 Long term (current) use of aspirin: Secondary | ICD-10-CM | POA: Diagnosis not present

## 2018-12-18 DIAGNOSIS — D649 Anemia, unspecified: Secondary | ICD-10-CM | POA: Insufficient documentation

## 2018-12-18 DIAGNOSIS — Z888 Allergy status to other drugs, medicaments and biological substances status: Secondary | ICD-10-CM | POA: Insufficient documentation

## 2018-12-18 DIAGNOSIS — Z794 Long term (current) use of insulin: Secondary | ICD-10-CM | POA: Insufficient documentation

## 2018-12-18 DIAGNOSIS — Z79899 Other long term (current) drug therapy: Secondary | ICD-10-CM | POA: Insufficient documentation

## 2018-12-18 DIAGNOSIS — E785 Hyperlipidemia, unspecified: Secondary | ICD-10-CM | POA: Diagnosis not present

## 2018-12-18 DIAGNOSIS — I10 Essential (primary) hypertension: Secondary | ICD-10-CM | POA: Insufficient documentation

## 2018-12-18 DIAGNOSIS — Z951 Presence of aortocoronary bypass graft: Secondary | ICD-10-CM | POA: Diagnosis not present

## 2018-12-18 DIAGNOSIS — E1140 Type 2 diabetes mellitus with diabetic neuropathy, unspecified: Secondary | ICD-10-CM | POA: Insufficient documentation

## 2018-12-18 DIAGNOSIS — G8918 Other acute postprocedural pain: Secondary | ICD-10-CM | POA: Diagnosis not present

## 2018-12-18 DIAGNOSIS — I2510 Atherosclerotic heart disease of native coronary artery without angina pectoris: Secondary | ICD-10-CM | POA: Insufficient documentation

## 2018-12-18 DIAGNOSIS — E11319 Type 2 diabetes mellitus with unspecified diabetic retinopathy without macular edema: Secondary | ICD-10-CM | POA: Diagnosis not present

## 2018-12-18 HISTORY — PX: TOTAL KNEE ARTHROPLASTY: SHX125

## 2018-12-18 LAB — COMPREHENSIVE METABOLIC PANEL
ALT: 29 U/L (ref 0–44)
AST: 24 U/L (ref 15–41)
Albumin: 4.1 g/dL (ref 3.5–5.0)
Alkaline Phosphatase: 50 U/L (ref 38–126)
Anion gap: 9 (ref 5–15)
BUN: 13 mg/dL (ref 8–23)
CO2: 23 mmol/L (ref 22–32)
Calcium: 9.1 mg/dL (ref 8.9–10.3)
Chloride: 102 mmol/L (ref 98–111)
Creatinine, Ser: 0.94 mg/dL (ref 0.61–1.24)
GFR calc Af Amer: >60 mL/min (ref >60)
GFR calc non Af Amer: >60 mL/min (ref >60)
Glucose, Bld: 170 mg/dL — ABNORMAL HIGH (ref 70–99)
Potassium: 4.4 mmol/L (ref 3.5–5.1)
Sodium: 134 mmol/L — ABNORMAL LOW (ref 135–145)
Total Bilirubin: 0.8 mg/dL (ref 0.3–1.2)
Total Protein: 6.8 g/dL (ref 6.5–8.1)

## 2018-12-18 LAB — GLUCOSE, CAPILLARY
Glucose-Capillary: 162 mg/dL — ABNORMAL HIGH (ref 70–99)
Glucose-Capillary: 131 mg/dL — ABNORMAL HIGH (ref 70–99)
Glucose-Capillary: 344 mg/dL — ABNORMAL HIGH (ref 70–99)

## 2018-12-18 LAB — TYPE AND SCREEN
ABO/RH(D): O POS
Antibody Screen: NEGATIVE

## 2018-12-18 SURGERY — ARTHROPLASTY, KNEE, TOTAL
Anesthesia: Spinal | Site: Knee | Laterality: Left

## 2018-12-18 MED ORDER — INSULIN ASPART 100 UNIT/ML ~~LOC~~ SOLN
0.0000 [IU] | Freq: Three times a day (TID) | SUBCUTANEOUS | Status: DC
  Administered 2018-12-19: 5 [IU] via SUBCUTANEOUS

## 2018-12-18 MED ORDER — METHOCARBAMOL 500 MG PO TABS
500.0000 mg | ORAL_TABLET | Freq: Four times a day (QID) | ORAL | Status: DC | PRN
  Administered 2018-12-18 – 2018-12-19 (×3): 500 mg via ORAL
  Filled 2018-12-18 (×3): qty 1

## 2018-12-18 MED ORDER — DEXAMETHASONE SODIUM PHOSPHATE 10 MG/ML IJ SOLN
INTRAMUSCULAR | Status: DC | PRN
  Administered 2018-12-18: 8 mg via INTRAVENOUS

## 2018-12-18 MED ORDER — OXYCODONE-ACETAMINOPHEN 5-325 MG PO TABS: 1.0000 | ORAL_TABLET | Freq: Four times a day (QID) | ORAL | 0 refills | Status: DC | PRN

## 2018-12-18 MED ORDER — POVIDONE-IODINE 10 % EX SWAB
2.0000 "application " | Freq: Once | CUTANEOUS | Status: AC
  Administered 2018-12-18: 2 via TOPICAL

## 2018-12-18 MED ORDER — CEFAZOLIN SODIUM-DEXTROSE 2-4 GM/100ML-% IV SOLN
2.0000 g | Freq: Four times a day (QID) | INTRAVENOUS | Status: AC
  Administered 2018-12-18 – 2018-12-19 (×2): 2 g via INTRAVENOUS
  Filled 2018-12-18 (×2): qty 100

## 2018-12-18 MED ORDER — DOCUSATE SODIUM 100 MG PO CAPS: 100.0000 mg | ORAL_CAPSULE | Freq: Two times a day (BID) | ORAL | 0 refills | Status: DC

## 2018-12-18 MED ORDER — ACETAMINOPHEN 500 MG PO TABS
1000.0000 mg | ORAL_TABLET | Freq: Once | ORAL | Status: AC
  Administered 2018-12-18: 1000 mg via ORAL
  Filled 2018-12-18: qty 2

## 2018-12-18 MED ORDER — PROPOFOL 10 MG/ML IV BOLUS
INTRAVENOUS | Status: AC
  Filled 2018-12-18: qty 80

## 2018-12-18 MED ORDER — CHLORHEXIDINE GLUCONATE 4 % EX LIQD: 60.0000 mL | Freq: Once | CUTANEOUS | Status: DC

## 2018-12-18 MED ORDER — OXYCODONE HCL 5 MG PO TABS
5.0000 mg | ORAL_TABLET | ORAL | Status: DC | PRN
  Administered 2018-12-18 – 2018-12-19 (×5): 10 mg via ORAL
  Filled 2018-12-18 (×4): qty 2

## 2018-12-18 MED ORDER — METFORMIN HCL 500 MG PO TABS
1000.0000 mg | ORAL_TABLET | Freq: Two times a day (BID) | ORAL | Status: DC
  Administered 2018-12-19: 1000 mg via ORAL
  Filled 2018-12-18: qty 2

## 2018-12-18 MED ORDER — ONDANSETRON HCL 4 MG/2ML IJ SOLN
INTRAMUSCULAR | Status: DC | PRN
  Administered 2018-12-18: 4 mg via INTRAVENOUS

## 2018-12-18 MED ORDER — POLYETHYLENE GLYCOL 3350 17 G PO PACK: 17.0000 g | PACK | Freq: Every day | ORAL | Status: DC | PRN

## 2018-12-18 MED ORDER — METHOCARBAMOL 500 MG IVPB - SIMPLE MED
500.0000 mg | Freq: Four times a day (QID) | INTRAVENOUS | Status: DC | PRN
  Filled 2018-12-18: qty 50

## 2018-12-18 MED ORDER — SODIUM CHLORIDE 0.9 % IV SOLN
INTRAVENOUS | Status: DC
  Administered 2018-12-18 – 2018-12-19 (×2): via INTRAVENOUS

## 2018-12-18 MED ORDER — GABAPENTIN 300 MG PO CAPS
300.0000 mg | ORAL_CAPSULE | Freq: Two times a day (BID) | ORAL | Status: DC
  Administered 2018-12-18 – 2018-12-19 (×2): 300 mg via ORAL
  Filled 2018-12-18 (×2): qty 1

## 2018-12-18 MED ORDER — METOPROLOL TARTRATE 25 MG PO TABS
25.0000 mg | ORAL_TABLET | Freq: Two times a day (BID) | ORAL | Status: DC
  Administered 2018-12-18 – 2018-12-19 (×2): 25 mg via ORAL
  Filled 2018-12-18 (×2): qty 1

## 2018-12-18 MED ORDER — SODIUM CHLORIDE 0.9 % IR SOLN
Status: DC | PRN
  Administered 2018-12-18: 1000 mL

## 2018-12-18 MED ORDER — BUPIVACAINE LIPOSOME 1.3 % IJ SUSP
INTRAMUSCULAR | Status: DC | PRN
  Administered 2018-12-18: 20 mL

## 2018-12-18 MED ORDER — BUPIVACAINE IN DEXTROSE 0.75-8.25 % IT SOLN
INTRATHECAL | Status: DC | PRN
  Administered 2018-12-18: 1.6 mL via INTRATHECAL

## 2018-12-18 MED ORDER — MIDAZOLAM HCL 2 MG/2ML IJ SOLN
1.0000 mg | INTRAMUSCULAR | Status: DC
  Administered 2018-12-18: 1 mg via INTRAVENOUS
  Filled 2018-12-18: qty 2

## 2018-12-18 MED ORDER — DOCUSATE SODIUM 100 MG PO CAPS
100.0000 mg | ORAL_CAPSULE | Freq: Two times a day (BID) | ORAL | Status: DC
  Administered 2018-12-18 – 2018-12-19 (×2): 100 mg via ORAL
  Filled 2018-12-18 (×2): qty 1

## 2018-12-18 MED ORDER — HYDROMORPHONE HCL 1 MG/ML IJ SOLN: 0.5000 mg | INTRAMUSCULAR | Status: DC | PRN

## 2018-12-18 MED ORDER — ONDANSETRON HCL 4 MG/2ML IJ SOLN: 4.0000 mg | Freq: Four times a day (QID) | INTRAMUSCULAR | Status: DC | PRN

## 2018-12-18 MED ORDER — BISACODYL 5 MG PO TBEC: 5.0000 mg | DELAYED_RELEASE_TABLET | Freq: Every day | ORAL | Status: DC | PRN

## 2018-12-18 MED ORDER — PROPOFOL 500 MG/50ML IV EMUL
INTRAVENOUS | Status: AC
  Filled 2018-12-18: qty 50

## 2018-12-18 MED ORDER — ACETAMINOPHEN 325 MG PO TABS
325.0000 mg | ORAL_TABLET | Freq: Four times a day (QID) | ORAL | Status: DC | PRN
  Administered 2018-12-19: 650 mg via ORAL
  Filled 2018-12-18: qty 2

## 2018-12-18 MED ORDER — ALUM & MAG HYDROXIDE-SIMETH 200-200-20 MG/5ML PO SUSP: 30.0000 mL | ORAL | Status: DC | PRN

## 2018-12-18 MED ORDER — 0.9 % SODIUM CHLORIDE (POUR BTL) OPTIME
TOPICAL | Status: DC | PRN
  Administered 2018-12-18: 1000 mL

## 2018-12-18 MED ORDER — ASPIRIN EC 325 MG PO TBEC: 325.0000 mg | DELAYED_RELEASE_TABLET | Freq: Two times a day (BID) | ORAL | 0 refills | Status: DC

## 2018-12-18 MED ORDER — IRBESARTAN 75 MG PO TABS
75.0000 mg | ORAL_TABLET | Freq: Every day | ORAL | Status: DC
  Administered 2018-12-18 – 2018-12-19 (×2): 75 mg via ORAL
  Filled 2018-12-18 (×2): qty 1

## 2018-12-18 MED ORDER — SODIUM CHLORIDE (PF) 0.9 % IJ SOLN
INTRAMUSCULAR | Status: AC
  Filled 2018-12-18: qty 50

## 2018-12-18 MED ORDER — SODIUM CHLORIDE 0.9% FLUSH
INTRAVENOUS | Status: DC | PRN
  Administered 2018-12-18: 50 mL via INTRAVENOUS

## 2018-12-18 MED ORDER — PROPOFOL 10 MG/ML IV BOLUS
INTRAVENOUS | Status: DC | PRN
  Administered 2018-12-18: 20 mg via INTRAVENOUS

## 2018-12-18 MED ORDER — TRANEXAMIC ACID-NACL 1000-0.7 MG/100ML-% IV SOLN
1000.0000 mg | Freq: Once | INTRAVENOUS | Status: AC
  Administered 2018-12-18: 1000 mg via INTRAVENOUS
  Filled 2018-12-18: qty 100

## 2018-12-18 MED ORDER — WATER FOR IRRIGATION, STERILE IR SOLN
Status: DC | PRN
  Administered 2018-12-18: 2000 mL

## 2018-12-18 MED ORDER — DEXAMETHASONE SODIUM PHOSPHATE 10 MG/ML IJ SOLN
10.0000 mg | Freq: Two times a day (BID) | INTRAMUSCULAR | Status: AC
  Administered 2018-12-19: 10 mg via INTRAVENOUS
  Filled 2018-12-18: qty 1

## 2018-12-18 MED ORDER — BUPIVACAINE IN DEXTROSE 0.75-8.25 % IT SOLN: INTRATHECAL | Status: DC | PRN

## 2018-12-18 MED ORDER — DEXAMETHASONE SODIUM PHOSPHATE 10 MG/ML IJ SOLN
INTRAMUSCULAR | Status: AC
  Filled 2018-12-18: qty 1

## 2018-12-18 MED ORDER — CANAGLIFLOZIN 100 MG PO TABS
100.0000 mg | ORAL_TABLET | Freq: Every day | ORAL | Status: DC
  Administered 2018-12-19: 100 mg via ORAL
  Filled 2018-12-18: qty 1

## 2018-12-18 MED ORDER — FENTANYL CITRATE (PF) 100 MCG/2ML IJ SOLN
50.0000 ug | INTRAMUSCULAR | Status: DC
  Administered 2018-12-18: 100 ug via INTRAVENOUS
  Filled 2018-12-18: qty 2

## 2018-12-18 MED ORDER — TRANEXAMIC ACID-NACL 1000-0.7 MG/100ML-% IV SOLN
1000.0000 mg | INTRAVENOUS | Status: AC
  Administered 2018-12-18: 1000 mg via INTRAVENOUS
  Filled 2018-12-18: qty 100

## 2018-12-18 MED ORDER — MAGNESIUM CITRATE PO SOLN: 1.0000 | Freq: Once | ORAL | Status: DC | PRN

## 2018-12-18 MED ORDER — ONDANSETRON HCL 4 MG PO TABS: 4.0000 mg | ORAL_TABLET | Freq: Four times a day (QID) | ORAL | Status: DC | PRN

## 2018-12-18 MED ORDER — CELECOXIB 200 MG PO CAPS
200.0000 mg | ORAL_CAPSULE | Freq: Two times a day (BID) | ORAL | Status: DC
  Administered 2018-12-18 – 2018-12-19 (×2): 200 mg via ORAL
  Filled 2018-12-18 (×2): qty 1

## 2018-12-18 MED ORDER — LACTATED RINGERS IV SOLN
INTRAVENOUS | Status: DC
  Administered 2018-12-18 (×2): via INTRAVENOUS

## 2018-12-18 MED ORDER — BUPIVACAINE-EPINEPHRINE (PF) 0.5% -1:200000 IJ SOLN
INTRAMUSCULAR | Status: DC | PRN
  Administered 2018-12-18: 20 mL via PERINEURAL

## 2018-12-18 MED ORDER — PROPOFOL 500 MG/50ML IV EMUL
INTRAVENOUS | Status: DC | PRN
  Administered 2018-12-18: 125 ug/kg/min via INTRAVENOUS

## 2018-12-18 MED ORDER — BUPIVACAINE-EPINEPHRINE 0.5% -1:200000 IJ SOLN
INTRAMUSCULAR | Status: DC | PRN
  Administered 2018-12-18: 30 mL

## 2018-12-18 MED ORDER — BUPIVACAINE-EPINEPHRINE 0.5% -1:200000 IJ SOLN
INTRAMUSCULAR | Status: AC
  Filled 2018-12-18: qty 1

## 2018-12-18 MED ORDER — CEFAZOLIN SODIUM-DEXTROSE 2-4 GM/100ML-% IV SOLN
2.0000 g | INTRAVENOUS | Status: AC
  Administered 2018-12-18: 2 g via INTRAVENOUS
  Filled 2018-12-18: qty 100

## 2018-12-18 MED ORDER — ROSUVASTATIN CALCIUM 20 MG PO TABS
20.0000 mg | ORAL_TABLET | Freq: Every day | ORAL | Status: DC
  Administered 2018-12-18: 20 mg via ORAL
  Filled 2018-12-18: qty 1

## 2018-12-18 MED ORDER — GLIMEPIRIDE 4 MG PO TABS
4.0000 mg | ORAL_TABLET | Freq: Every day | ORAL | Status: DC
  Administered 2018-12-19: 4 mg via ORAL
  Filled 2018-12-18: qty 1

## 2018-12-18 MED ORDER — ONDANSETRON HCL 4 MG/2ML IJ SOLN
INTRAMUSCULAR | Status: AC
  Filled 2018-12-18: qty 2

## 2018-12-18 MED ORDER — ASPIRIN EC 325 MG PO TBEC
325.0000 mg | DELAYED_RELEASE_TABLET | Freq: Two times a day (BID) | ORAL | Status: DC
  Administered 2018-12-19: 325 mg via ORAL
  Filled 2018-12-18: qty 1

## 2018-12-18 MED ORDER — DIPHENHYDRAMINE HCL 12.5 MG/5ML PO ELIX: 12.5000 mg | ORAL_SOLUTION | ORAL | Status: DC | PRN

## 2018-12-18 MED ORDER — HYDROMORPHONE HCL 1 MG/ML IJ SOLN: 0.2500 mg | INTRAMUSCULAR | Status: DC | PRN

## 2018-12-18 MED ORDER — TIZANIDINE HCL 2 MG PO TABS: 2.0000 mg | ORAL_TABLET | Freq: Three times a day (TID) | ORAL | 0 refills | Status: DC | PRN

## 2018-12-18 SURGICAL SUPPLY — 54 items
APL SKNCLS STERI-STRIP NONHPOA (GAUZE/BANDAGES/DRESSINGS) ×1
ATTUNE MED DOME PAT 32 KNEE (Knees) ×2 IMPLANT
ATTUNE PSFEM LTSZ4 NARCEM KNEE (Femur) ×2 IMPLANT
ATTUNE PSRP INSR SZ4 6 KNEE (Insert) ×2 IMPLANT
BAG ZIPLOCK 12X15 (MISCELLANEOUS) ×2 IMPLANT
BASEPLATE TIBIAL ROTATING SZ 4 (Knees) ×2 IMPLANT
BENZOIN TINCTURE PRP APPL 2/3 (GAUZE/BANDAGES/DRESSINGS) ×2 IMPLANT
BLADE SAGITTAL 25.0X1.19X90 (BLADE) ×2 IMPLANT
BLADE SAW SGTL 13.0X1.19X90.0M (BLADE) ×2 IMPLANT
BLADE SURG SZ10 CARB STEEL (BLADE) ×4 IMPLANT
BNDG ELASTIC 6X10 VLCR STRL LF (GAUZE/BANDAGES/DRESSINGS) ×2 IMPLANT
BNDG ELASTIC 6X5.8 VLCR STR LF (GAUZE/BANDAGES/DRESSINGS) ×2 IMPLANT
BOOTIES KNEE HIGH SLOAN (MISCELLANEOUS) ×2 IMPLANT
BOWL SMART MIX CTS (DISPOSABLE) ×2 IMPLANT
BSPLAT TIB 4 CMNT ROT PLAT STR (Knees) ×1 IMPLANT
CEMENT HV SMART SET (Cement) ×4 IMPLANT
CLSR STERI-STRIP ANTIMIC 1/2X4 (GAUZE/BANDAGES/DRESSINGS) ×2 IMPLANT
COVER SURGICAL LIGHT HANDLE (MISCELLANEOUS) ×2 IMPLANT
COVER WAND RF STERILE (DRAPES) ×2 IMPLANT
CUFF TOURN SGL QUICK 34 (TOURNIQUET CUFF) ×2
CUFF TRNQT CYL 34X4.125X (TOURNIQUET CUFF) ×1 IMPLANT
DECANTER SPIKE VIAL GLASS SM (MISCELLANEOUS) ×4 IMPLANT
DRAPE U-SHAPE 47X51 STRL (DRAPES) ×2 IMPLANT
DRESSING AQUACEL AG SP 3.5X10 (GAUZE/BANDAGES/DRESSINGS) ×1 IMPLANT
DRSG AQUACEL AG ADV 3.5X10 (GAUZE/BANDAGES/DRESSINGS) ×2 IMPLANT
DRSG AQUACEL AG SP 3.5X10 (GAUZE/BANDAGES/DRESSINGS) ×2
DURAPREP 26ML APPLICATOR (WOUND CARE) ×2 IMPLANT
ELECT REM PT RETURN 15FT ADLT (MISCELLANEOUS) ×2 IMPLANT
GLOVE BIOGEL PI IND STRL 8 (GLOVE) ×2 IMPLANT
GLOVE BIOGEL PI INDICATOR 8 (GLOVE) ×2
GLOVE ECLIPSE 7.5 STRL STRAW (GLOVE) ×4 IMPLANT
GOWN STRL REUS W/TWL XL LVL3 (GOWN DISPOSABLE) ×4 IMPLANT
HANDPIECE INTERPULSE COAX TIP (DISPOSABLE) ×2
HOLDER FOLEY CATH W/STRAP (MISCELLANEOUS) ×2 IMPLANT
HOOD PEEL AWAY FLYTE STAYCOOL (MISCELLANEOUS) ×6 IMPLANT
KIT TURNOVER KIT A (KITS) IMPLANT
MANIFOLD NEPTUNE II (INSTRUMENTS) ×2 IMPLANT
NEEDLE HYPO 22GX1.5 SAFETY (NEEDLE) ×2 IMPLANT
NS IRRIG 1000ML POUR BTL (IV SOLUTION) ×2 IMPLANT
PACK ICE MAXI GEL EZY WRAP (MISCELLANEOUS) ×2 IMPLANT
PACK TOTAL KNEE CUSTOM (KITS) ×2 IMPLANT
PADDING CAST COTTON 6X4 STRL (CAST SUPPLIES) ×2 IMPLANT
PIN STEINMAN FIXATION KNEE (PIN) ×2 IMPLANT
PIN THREADED HEADED SIGMA (PIN) ×2 IMPLANT
PROTECTOR NERVE ULNAR (MISCELLANEOUS) ×2 IMPLANT
SET HNDPC FAN SPRY TIP SCT (DISPOSABLE) ×1 IMPLANT
STRIP CLOSURE SKIN 1/2X4 (GAUZE/BANDAGES/DRESSINGS) IMPLANT
SUT MNCRL AB 3-0 PS2 18 (SUTURE) ×2 IMPLANT
SUT VIC AB 0 CT1 36 (SUTURE) ×2 IMPLANT
SUT VIC AB 1 CT1 36 (SUTURE) ×8 IMPLANT
SYR CONTROL 10ML LL (SYRINGE) ×4 IMPLANT
TRAY FOLEY MTR SLVR 16FR STAT (SET/KITS/TRAYS/PACK) ×2 IMPLANT
WATER STERILE IRR 1000ML POUR (IV SOLUTION) ×4 IMPLANT
YANKAUER SUCT BULB TIP 10FT TU (MISCELLANEOUS) ×2 IMPLANT

## 2018-12-18 NOTE — Care Plan (Signed)
Ortho Bundle Case Management Note  Patient Details  Name: William Haney MRN: [id number] Date of Birth: May 31, 1950   Spoke with patient prior to surgery. He plans to discharge to home with family and HHPT. Referral to [facility]. Rolling walker and CPM ordered. Patient and MD in agreement with plan. Choice offered.                   DME Arranged:  Gilford Rile rolling DME Agency:  [employer]  HH Arranged:  PT [place] Agency:  [facility] (formerly [facility])  Additional Comments: Please contact me with any questions of if this plan should need to change.  Ladell Heads,  [place] [facility]  [phone] 12/18/2018, 1:47 PM

## 2018-12-18 NOTE — Anesthesia Procedure Notes (Signed)
Procedure Name: MAC Date/Time: 12/18/2018 1:37 PM Performed by: Niel Hummer, CRNA Pre-anesthesia Checklist: Patient identified, Emergency Drugs available, Suction available and Patient being monitored Patient Re-evaluated:Patient Re-evaluated prior to induction Oxygen Delivery Method: Simple face mask

## 2018-12-18 NOTE — Anesthesia Procedure Notes (Signed)
Anesthesia Regional Block: Adductor canal block   Pre-Anesthetic Checklist: ,, timeout performed, Correct Patient, Correct Site, Correct Laterality, Correct Procedure, Correct Position, site marked, Risks and benefits discussed, pre-op evaluation,  At surgeon's request and post-op pain management  Laterality: Left  Prep: Maximum Sterile Barrier Precautions used, chloraprep       Needles:  Injection technique: Single-shot  Needle Type: Echogenic Stimulator Needle     Needle Length: 9cm  Needle Gauge: 21     Additional Needles:   Procedures:,,,, ultrasound used (permanent image in chart),,,,  Narrative:  Start time: 12/18/2018 11:11 AM End time: 12/18/2018 11:21 AM Injection made incrementally with aspirations every 5 mL.  Performed by: Personally  Anesthesiologist: Roderic Palau, MD  Additional Notes: 2% Lidocaine skin wheel.

## 2018-12-18 NOTE — Anesthesia Postprocedure Evaluation (Signed)
Anesthesia Post Note  Patient: William Haney  Procedure(s) Performed: TOTAL KNEE ARTHROPLASTY (Left Knee)     Patient location during evaluation: PACU Anesthesia Type: Spinal and Regional Level of consciousness: oriented and awake and alert Pain management: pain level controlled Vital Signs Assessment: post-procedure vital signs reviewed and stable Respiratory status: spontaneous breathing, respiratory function stable and patient connected to nasal cannula oxygen Cardiovascular status: blood pressure returned to baseline and stable Postop Assessment: no headache, no backache, no apparent nausea or vomiting, spinal receding and patient able to bend at knees Anesthetic complications: no    Last Vitals:  Vitals:   12/18/18 1715 12/18/18 1730  BP: 136/75 135/80  Pulse: 69 78  Resp: 19 17  Temp:    SpO2: 96% 96%    Last Pain:  Vitals:   12/18/18 1730  TempSrc:   PainSc: 0-No pain    LLE Motor Response: Purposeful movement (12/18/18 1730) LLE Sensation: Numbness (12/18/18 1730) RLE Motor Response: Purposeful movement (12/18/18 1730) RLE Sensation: Numbness (12/18/18 1730) L Sensory Level: S1-Sole of foot, small toes (12/18/18 1730) R Sensory Level: S1-Sole of foot, small toes (12/18/18 1730)  Macy Polio,W. [place]

## 2018-12-18 NOTE — Transfer of Care (Signed)
Immediate Anesthesia Transfer of Care Note  Patient: William Haney  Procedure(s) Performed: TOTAL KNEE ARTHROPLASTY (Left Knee)  Patient Location: PACU  Anesthesia Type:Spinal  Level of Consciousness: awake, alert  and oriented  Airway & Oxygen Therapy: Patient Spontanous Breathing and Patient connected to face mask oxygen  Post-op Assessment: Report given to RN and Post -op Vital signs reviewed and stable  Post vital signs: Reviewed and stable  Last Vitals:  Vitals Value Taken Time  BP 115/84 12/18/18 1555  Temp    Pulse 78 12/18/18 1556  Resp 24 12/18/18 1556  SpO2 100 % 12/18/18 1556  Vitals shown include unvalidated device data.  Last Pain:  Vitals:   12/18/18 1232  TempSrc:   PainSc: 0-No pain      Patients Stated Pain Goal: 3 ([id number])  Complications: No apparent anesthesia complications

## 2018-12-18 NOTE — Progress Notes (Signed)
Assisted Dr. Edmond Fitzgerald with left, ultrasound guided, adductor canal block. Side rails up, monitors on throughout procedure. See vital signs in flow sheet. Tolerated Procedure well. 

## 2018-12-18 NOTE — Discharge Instructions (Signed)

## 2018-12-18 NOTE — H&P (Signed)
TOTAL KNEE ADMISSION H&P  Patient is being admitted for left total knee arthroplasty.  Subjective:  Chief Complaint:left knee pain.  HPI: William Haney, 68 y.o. male, has a history of pain and functional disability in the left knee due to arthritis and has failed non-surgical conservative treatments for greater than 12 weeks to includeNSAID's and/or analgesics, corticosteriod injections, viscosupplementation injections, flexibility and strengthening excercises, weight reduction as appropriate and activity modification.  Onset of symptoms was gradual, starting 5 years ago with gradually worsening course since that time. The patient noted no past surgery on the left knee(s).  Patient currently rates pain in the left knee(s) at 8 out of 10 with activity. Patient has night pain, worsening of pain with activity and weight bearing, pain that interferes with activities of daily living, pain with passive range of motion and joint swelling.  Patient has evidence of subchondral cysts, subchondral sclerosis, periarticular osteophytes, joint subluxation and joint space narrowing by imaging studies. This patient has had Failure of all reasonable conservative care. There is no active infection.  Patient Active Problem List   Diagnosis Date Noted  . beta thalassemia minor (trait) anemia 03/08/2018  . Overweight (BMI 25.0-29.9) 08/10/2017  . Diabetic retinopathy of both eyes ([place]) 08/10/2017  . Poor compliance 09/09/2015  . DM type 2 (diabetes mellitus, type 2) ([place]) 09/08/2015  . Neuropathy, diabetic ([place]) 05/19/2015  . Encounter for Medicare annual wellness exam 09/21/2014  . Essential hypertension 10/11/2013  . Vitamin D deficiency 10/11/2013  . Medication management 10/11/2013  . Hyperlipidemia associated with type 2 diabetes mellitus ([place]) 03/01/2010  . Coronary atherosclerosis 03/01/2010  . Carotid stenosis 03/01/2010   Past Medical History:  Diagnosis Date  . Anemia   . Carotid artery stenosis  01/2017   L-ICA 1-39%, R-ICA 40-59% doppler  . Coronary artery disease    s/p CABG in 2008, w/ LIMA-LAD, SVG-Diag, SVG-OM, SVG-PL  . Diabetes mellitus type II   . Hyperlipidemia   . Hypertension   . Vitamin D deficiency     Past Surgical History:  Procedure Laterality Date  . CORONARY ARTERY BYPASS GRAFT  2008   LIMA to the LAD, saphenous vein graft to diagonal, saphenous vein graft to OM, saphenous vein graft to posterolateral 2008    Current Facility-Administered Medications  Medication Dose Route Frequency Provider Last Rate Last Dose  . bupivacaine liposome (EXPAREL) 1.3 % injection 266 mg  20 mL Other On Call to OR Dorna Leitz, MD       Current Outpatient Medications  Medication Sig Dispense Refill Last Dose  . aspirin 81 MG tablet Take 81 mg by mouth daily.     . Cholecalciferol (VITAMIN D3) 125 MCG (5000 UT) CAPS Take 5,000 Units by mouth 4 (four) times a week.      . Cinnamon 500 MG capsule Take 2 capsules (1,000 mg total) by mouth 2 (two) times daily. (Patient taking differently: Take 500 mg by mouth daily. ) 60 capsule 3   . empagliflozin (JARDIANCE) 10 MG TABS tablet Take 10 mg by mouth daily.     [name] glimepiride (AMARYL) 4 MG tablet Take 1 tablet 2 x / day for Diabetes (Patient taking differently: Take 4 mg by mouth daily with breakfast. ) 180 tablet 1   . metFORMIN (GLUCOPHAGE) 500 MG tablet TAKE 2 TABLETS BY MOUTH EVERY MORNING AND 2 EVERY EVENING (Patient taking differently: Take 1,000 mg by mouth 2 (two) times daily with a meal. ) 360 tablet 2   . metoprolol tartrate (  LOPRESSOR) 25 MG tablet Take 1 tablet 2 x /day for BP (Patient taking differently: Take 25 mg by mouth 2 (two) times daily. ) 180 tablet 1   . Multiple Vitamin (MULTIVITAMIN) tablet Take 1 tablet by mouth daily.     . rosuvastatin (CRESTOR) 20 MG tablet Take 1 tablet Daily for Cholesterol (Patient taking differently: Take 20 mg by mouth at bedtime. ) 90 tablet 1   . telmisartan (MICARDIS) 20 MG tablet TAKE 1  TABLET DAILY FOR BP & KIDNEY PROTECTION (Patient taking differently: Take 20 mg by mouth daily. ) 90 tablet 1   . blood glucose meter kit and supplies Test sugars once to twice daily 1 each 0   . empagliflozin (JARDIANCE) 25 MG TABS tablet Take 25 mg by mouth daily. (Patient not taking: Reported on 12/10/2018) 30 tablet 3 Not Taking at Unknown time  . Insulin Degludec (TRESIBA) 100 UNIT/ML SOLN Inject 10 Units into the skin daily. (Patient not taking: Reported on 11/19/2018) 10 mL 0   . Insulin Detemir (LEVEMIR) 100 UNIT/ML Pen 10-20 units daily as needed to reach glucose goal (Patient not taking: Reported on 11/19/2018) 15 mL 11   . NOVOFINE 32G X 6 MM MISC Needs daily for long acting insulin pen (Patient not taking: Reported on 11/19/2018) 50 each 5    Allergies  Allergen Reactions  . Ace Inhibitors Cough  . Statins Other (See Comments)    Elevated LFT's    Social History   Tobacco Use  . Smoking status: Former Smoker    Packs/day: 1.00    Years: 34.00    Pack years: 34.00    Start date: 1    Quit date: 08/28/2005    Years since quitting: 13.3  . Smokeless tobacco: Never Used  Substance Use Topics  . Alcohol use: No    Family History  Problem Relation Age of Onset  . Hypertension Father   . Heart disease Father   . Hyperlipidemia Father   . Heart attack Father   . Diabetes Sister   . Heart disease Brother      ROS ROS: I have reviewed the patient's review of systems thoroughly and there are no positive responses as relates to the HPI. Objective:  Physical Exam  Vital signs in last 24 hours:   Well-developed well-nourished patient in no acute distress. Alert and oriented x3 HEENT:within normal limits Cardiac: Regular rate and rhythm Pulmonary: Lungs clear to auscultation Abdomen: Soft and nontender.  Normal active bowel sounds  Musculoskeletal: (Left knee: Painful range of motion.  Limited range of motion.  No instability.  Trace effusion. Labs: Recent Results  (from the past 2160 hour(s))  Hemoglobin A1c     Status: Abnormal   Collection Time: 10/01/18 12:32 PM  Result Value Ref Range   Hgb A1c MFr Bld 8.0 (H) <5.7 % of total Hgb    Comment: For someone without known diabetes, a hemoglobin A1c value of 6.5% or greater indicates that they may have  diabetes and this should be confirmed with a follow-up  test. . For someone with known diabetes, a value <7% indicates  that their diabetes is well controlled and a value  greater than or equal to 7% indicates suboptimal  control. A1c targets should be individualized based on  duration of diabetes, age, comorbid conditions, and  other considerations. . Currently, no consensus exists regarding use of hemoglobin A1c for diagnosis of diabetes for children. .    Mean Plasma Glucose 183 (calc)  eAG (mmol/L) 10.1 (calc)  Fructosamine     Status: Abnormal   Collection Time: 10/01/18 12:32 PM  Result Value Ref Range   Fructosamine 305 (H) 205 - 285 umol/L  CBC with Differential/Platelet     Status: Abnormal   Collection Time: 11/19/18  2:54 PM  Result Value Ref Range   WBC 6.9 3.8 - 10.8 Thousand/uL   RBC 6.16 (H) 4.20 - 5.80 Million/uL   Hemoglobin 12.5 (L) 13.2 - 17.1 g/dL   HCT 42.6 38.5 - 50.0 %   MCV 69.2 (L) 80.0 - 100.0 fL   MCH 20.3 (L) 27.0 - 33.0 pg   MCHC 29.3 (L) 32.0 - 36.0 g/dL   RDW 17.9 (H) 11.0 - 15.0 %   Platelets 321 140 - 400 Thousand/uL   MPV  7.5 - 12.5 fL    Comment: Due to platelet or RBC variability in size or shape the result cannot be reported accurately.    Neutro Abs 3,850 1,500 - 7,800 cells/uL   Lymphs Abs 1,663 850 - 3,900 cells/uL   Absolute Monocytes 704 200 - 950 cells/uL   Eosinophils Absolute 614 (H) 15 - 500 cells/uL   Basophils Absolute 69 0 - 200 cells/uL   Neutrophils Relative % 55.8 %   Total Lymphocyte 24.1 %   Monocytes Relative 10.2 %   Eosinophils Relative 8.9 %   Basophils Relative 1.0 %   Smear Review      Comment: . Red cell  morphology appears unremarkable . Review of peripheral smear confirms automated results.   COMPLETE METABOLIC PANEL WITH GFR     Status: Abnormal   Collection Time: 11/19/18  2:54 PM  Result Value Ref Range   Glucose, Bld 126 (H) 65 - 99 mg/dL    Comment: .            Fasting reference interval . For someone without known diabetes, a glucose value >125 mg/dL indicates that they may have diabetes and this should be confirmed with a follow-up test. .    BUN 17 7 - 25 mg/dL   Creat 1.05 0.70 - 1.18 mg/dL    Comment: For patients >83 years of age, the reference limit for Creatinine is approximately 13% higher for people identified as African-American. .    GFR, Est Non African American 72 > OR = 60 mL/min/1.94m   GFR, Est African American 83 > OR = 60 mL/min/1.742m  BUN/Creatinine Ratio NOT APPLICABLE 6 - 22 (calc)   Sodium 136 135 - 146 mmol/L   Potassium 5.5 (H) 3.5 - 5.3 mmol/L   Chloride 100 98 - 110 mmol/L   CO2 29 20 - 32 mmol/L   Calcium 9.8 8.6 - 10.3 mg/dL   Total Protein 6.8 6.1 - 8.1 g/dL   Albumin 4.4 3.6 - 5.1 g/dL   Globulin 2.4 1.9 - 3.7 g/dL (calc)   AG Ratio 1.8 1.0 - 2.5 (calc)   Total Bilirubin 0.6 0.2 - 1.2 mg/dL   Alkaline phosphatase (APISO) 50 35 - 144 U/L   AST 21 10 - 35 U/L   ALT 21 9 - 46 U/L  Magnesium     Status: None   Collection Time: 11/19/18  2:54 PM  Result Value Ref Range   Magnesium 2.1 1.5 - 2.5 mg/dL  Lipid panel     Status: None   Collection Time: 11/19/18  2:54 PM  Result Value Ref Range   Cholesterol 98 <200 mg/dL   HDL 43 > OR =  40 mg/dL   Triglycerides 71 <150 mg/dL   LDL Cholesterol (Calc) 40 mg/dL (calc)    Comment: Reference range: <100 . Desirable range <100 mg/dL for primary prevention;   <70 mg/dL for patients with CHD or diabetic patients  with > or = 2 CHD risk factors. [name] LDL-C is now calculated using the Martin-Hopkins  calculation, which is a validated novel method providing  better accuracy than the  Friedewald equation in the  estimation of LDL-C.  Cresenciano Genre et al. Annamaria Helling. 7782;423(53): 2061-2068  (http://education.[employer].com/faq/FAQ164)    Total CHOL/HDL Ratio 2.3 <5.0 (calc)   Non-HDL Cholesterol (Calc) 55 <130 mg/dL (calc)    Comment: For patients with diabetes plus 1 major ASCVD risk  factor, treating to a non-HDL-C goal of <100 mg/dL  (LDL-C of <70 mg/dL) is considered a therapeutic  option.   TSH     Status: None   Collection Time: 11/19/18  2:54 PM  Result Value Ref Range   TSH 2.33 0.40 - 4.50 mIU/L  Hemoglobin A1c     Status: Abnormal   Collection Time: 11/19/18  2:54 PM  Result Value Ref Range   Hgb A1c MFr Bld 7.3 (H) <5.7 % of total Hgb    Comment: For someone without known diabetes, a hemoglobin A1c value of 6.5% or greater indicates that they may have  diabetes and this should be confirmed with a follow-up  test. . For someone with known diabetes, a value <7% indicates  that their diabetes is well controlled and a value  greater than or equal to 7% indicates suboptimal  control. A1c targets should be individualized based on  duration of diabetes, age, comorbid conditions, and  other considerations. . Currently, no consensus exists regarding use of hemoglobin A1c for diagnosis of diabetes for children. .    Mean Plasma Glucose 163 (calc)   eAG (mmol/L) 9.0 (calc)  Glucose, capillary     Status: Abnormal   Collection Time: 12/15/18 11:11 AM  Result Value Ref Range   Glucose-Capillary 168 (H) 70 - 99 mg/dL  APTT     Status: None   Collection Time: 12/15/18 11:48 AM  Result Value Ref Range   aPTT 30 24 - 36 seconds    Comment: Performed at [facility], [place] [address], [place], [place] [address]  CBC WITH DIFFERENTIAL     Status: Abnormal   Collection Time: 12/15/18 11:48 AM  Result Value Ref Range   WBC 7.6 4.0 - 10.5 K/uL   RBC 6.25 (H) 4.22 - 5.81 MIL/uL   Hemoglobin 12.7 (L) 13.0 - 17.0 g/dL   HCT 42.1 39.0 - 52.0 %    MCV 67.4 (L) 80.0 - 100.0 fL   MCH 20.3 (L) 26.0 - 34.0 pg   MCHC 30.2 30.0 - 36.0 g/dL   RDW 18.1 (H) 11.5 - 15.5 %   Platelets 258 150 - 400 K/uL   nRBC 0.0 0.0 - 0.2 %   Neutrophils Relative % 65 %   Neutro Abs 5.0 1.7 - 7.7 K/uL   Lymphocytes Relative 17 %   Lymphs Abs 1.3 0.7 - 4.0 K/uL   Monocytes Relative 8 %   Monocytes Absolute 0.6 0.1 - 1.0 K/uL   Eosinophils Relative 8 %   Eosinophils Absolute 0.6 (H) 0.0 - 0.5 K/uL   Basophils Relative 1 %   Basophils Absolute 0.1 0.0 - 0.1 K/uL   WBC Morphology MORPHOLOGY UNREMARKABLE    Immature Granulocytes 1 %   Abs Immature  Granulocytes 0.04 0.00 - 0.07 K/uL    Comment: Performed at [facility], [place] [address], [place], [place] [address]  Comprehensive metabolic panel     Status: Abnormal   Collection Time: 12/15/18 11:48 AM  Result Value Ref Range   Sodium 136 135 - 145 mmol/L   Potassium 5.7 (H) 3.5 - 5.1 mmol/L   Chloride 103 98 - 111 mmol/L   CO2 26 22 - 32 mmol/L   Glucose, Bld 149 (H) 70 - 99 mg/dL   BUN 20 8 - 23 mg/dL   Creatinine, Ser 1.00 0.61 - 1.24 mg/dL   Calcium 9.5 8.9 - 10.3 mg/dL   Total Protein 7.0 6.5 - 8.1 g/dL   Albumin 4.4 3.5 - 5.0 g/dL   AST 22 15 - 41 U/L   ALT 26 0 - 44 U/L   Alkaline Phosphatase 55 38 - 126 U/L   Total Bilirubin 0.8 0.3 - 1.2 mg/dL   GFR calc non Af Amer >60 >60 mL/min   GFR calc Af Amer >60 >60 mL/min   Anion gap 7 5 - 15    Comment: Performed at [facility], [place] [address], [place], [place] [address]  Protime-INR     Status: None   Collection Time: 12/15/18 11:48 AM  Result Value Ref Range   Prothrombin Time 12.5 11.4 - 15.2 seconds   INR 0.9 0.8 - 1.2    Comment: (NOTE) INR goal varies based on device and disease states. Performed at [facility], [place] [address], [place], [place] [address]   Type and screen Order type and screen if day of surgery is less than 15 days from draw of preadmission visit or order  morning of surgery if day of surgery is greater than 6 days from preadmission visit.     Status: None   Collection Time: 12/15/18 11:48 AM  Result Value Ref Range   ABO/RH(D) O POS    Antibody Screen NEG    Sample Expiration 12/21/2018,2359    Extend sample reason      NO TRANSFUSIONS OR PREGNANCY IN THE PAST 3 MONTHS Performed at [facility], [place] [address], [place], [place] [address]   Surgical pcr screen     Status: None   Collection Time: 12/15/18 11:48 AM   Specimen: Nasal Mucosa; Nasal Swab  Result Value Ref Range   MRSA, PCR NEGATIVE NEGATIVE   Staphylococcus aureus NEGATIVE NEGATIVE    Comment: (NOTE) The Xpert SA Assay (FDA approved for NASAL specimens in patients 65 years of age and older), is one component of a comprehensive surveillance program. It is not intended to diagnose infection nor to guide or monitor treatment. Performed at [facility], [place] [address] [place], [place] [address]   Hemoglobin A1c     Status: Abnormal   Collection Time: 12/15/18 11:48 AM  Result Value Ref Range   Hgb A1c MFr Bld 7.4 (H) 4.8 - 5.6 %    Comment: (NOTE) Pre diabetes:          5.7%-6.4% Diabetes:              >6.4% Glycemic control for   <7.0% adults with diabetes    Mean Plasma Glucose 165.68 mg/dL    Comment: Performed at [place] [address], [place], [place] [address]  ABO/Rh     Status: None   Collection Time: 12/15/18 11:48 AM  Result Value Ref Range  ABO/RH(D)      Jenetta Downer POS Performed at [place] [address] [place], [place] [address]   Urinalysis, Routine w reflex microscopic     Status: Abnormal   Collection Time: 12/15/18 12:22 PM  Result Value Ref Range   Color, Urine YELLOW YELLOW   APPearance CLEAR CLEAR   Specific Gravity, Urine 1.021 1.005 - 1.030   pH 5.0 5.0 - 8.0   Glucose, UA >=500 (A) NEGATIVE mg/dL   Hgb urine dipstick NEGATIVE NEGATIVE   Bilirubin Urine NEGATIVE  NEGATIVE   Ketones, ur NEGATIVE NEGATIVE mg/dL   Protein, ur NEGATIVE NEGATIVE mg/dL   Nitrite NEGATIVE NEGATIVE   Leukocytes,Ua NEGATIVE NEGATIVE   RBC / HPF 0-5 0 - 5 RBC/hpf   WBC, UA 0-5 0 - 5 WBC/hpf   Bacteria, UA RARE (A) NONE SEEN   Mucus PRESENT     Comment: Performed at [facility], [place] [address] [place],  [address]  Novel Coronavirus, NAA [facility] order, Send-out to Ref Lab; TAT 18-24 hrs     Status: None   Collection Time: 12/15/18 12:22 PM   Specimen: Nasopharyngeal Swab; Respiratory  Result Value Ref Range   SARS-CoV-2, NAA NOT DETECTED NOT DETECTED    Comment: (NOTE) This nucleic acid amplification test was developed and its performance characteristics determined by [employer]. Nucleic acid amplification tests include PCR and TMA. This test has not been FDA cleared or approved. This test has been authorized by FDA under an Emergency Use Authorization (EUA). This test is only authorized for the duration of time the declaration that circumstances exist justifying the authorization of the emergency use of in vitro diagnostic tests for detection of SARS-CoV-2 virus and/or diagnosis of COVID-19 infection under section 564(b)(1) of the Act, 21 U.S.C. 387FIE-3(P) (1), unless the authorization is terminated or revoked sooner. When diagnostic testing is negative, the possibility of a false negative result should be considered in the context of a patient's recent exposures and the presence of clinical signs and symptoms consistent with COVID-19. An individual without symptoms of COVID- 19 and who is not shedding SARS-CoV-2 vi rus would expect to have a negative (not detected) result in this assay. Performed At: [facility] [place], [place] [id number] [place] Farmer MD SA:[id number]    Coronavirus Source NASOPHARYNGEAL     Comment: Performed at [facility], [place] [address], [place],  [address]     Estimated body mass index is 24.75 kg/m as calculated from the following:   Height as of 12/15/18: _0  (1.549 m).   Weight as of 12/15/18: 59.4 kg.   Imaging Review Plain radiographs demonstrate severe degenerative joint disease of the left knee(s). The overall alignment ismild varus. The bone quality appears to be fair for age and reported activity level.      Assessment/Plan:  End stage arthritis, left knee   The patient history, physical examination, clinical judgment of the provider and imaging studies are consistent with end stage degenerative joint disease of the left knee(s) and total knee arthroplasty is deemed medically necessary. The treatment options including medical management, injection therapy arthroscopy and arthroplasty were discussed at length. The risks and benefits of total knee arthroplasty were presented and reviewed. The risks due to aseptic loosening, infection, stiffness, patella tracking problems, thromboembolic complications and other imponderables were discussed. The patient acknowledged the explanation, agreed to proceed with the plan and consent was signed. Patient is being admitted for inpatient treatment for  surgery, pain control, PT, OT, prophylactic antibiotics, VTE prophylaxis, progressive ambulation and ADL's and discharge planning. The patient is planning to be discharged home with home health services     Patient's anticipated LOS is less than 2 midnights, meeting these requirements: - Younger than 65 - Lives within 1 hour of care - Has a competent adult at home to recover with post-op recover - NO history of  - Chronic pain requiring opiods  - Diabetes  - Coronary Artery Disease  - Heart failure  - Heart attack  - Stroke  - DVT/VTE  - Cardiac arrhythmia  - Respiratory Failure/COPD  - Renal failure  - Anemia  - Advanced Liver disease

## 2018-12-18 NOTE — Op Note (Signed)
PATIENT ID:      William Haney  MRN:     [id number] DOB/AGE:    [birth year] / 68 y.o.       OPERATIVE REPORT   DATE OF PROCEDURE:  12/18/2018      PREOPERATIVE DIAGNOSIS:   OSTEOARTHRITIS LEFT KNEE      Estimated body mass index is 24.75 kg/m as calculated from the following:   Height as of this encounter: 5\' 1"  (1.549 m).   Weight as of this encounter: 59.4 kg.                                                       POSTOPERATIVE DIAGNOSIS:   OSTEOARTHRITIS LEFT KNEE                                                                       PROCEDURE:  Procedure(s): TOTAL KNEE ARTHROPLASTY Using DepuyAttune RP implants #4N Femur, #4Tibia, 32 mm Attune RP bearing, 6 Patella    SURGEON: Alta Corning  ASSISTANT:   Jim bethune PA-C   (Present and scrubbed throughout the case, critical for assistance with exposure, retraction, instrumentation, and closure.)        ANESTHESIA: spinal, 20cc Exparel, 50cc 0.25% Marcaine EBL: min cc FLUID REPLACEMENT: unk cc crystaloid TOURNIQUET: DRAINS: None TRANEXAMIC ACID: 1gm IV, 2gm topical COMPLICATIONS:  None         INDICATIONS FOR PROCEDURE: The patient has  OSTEOARTHRITIS LEFT KNEE, varus deformities, XR shows bone on bone arthritis, lateral subluxation of tibia. Patient has failed all conservative measures including anti-inflammatory medicines, narcotics, attempts at exercise and weight loss, cortisone injections and viscosupplementation.  Risks and benefits of surgery have been discussed, questions answered.   DESCRIPTION OF PROCEDURE: The patient identified by armband, received  IV antibiotics, in the holding area at [facility]. Patient taken to the operating room, appropriate anesthetic monitors were attached, and spinal anesthesia was  induced. IV Tranexamic acid was given.Tourniquet applied high to the operative thigh. Lateral post and foot positioner applied to the table, the lower extremity was then prepped and draped in usual sterile  fashion from the toes to the tourniquet. Time-out procedure was performed. The skin and subcutaneous tissue along the incision was injected with 20 cc of a mixture of Exparel and Marcaine solution, using a 20-gauge by 1-1/2 inch needle. We began the operation, with the knee flexed 130 degrees, by making the anterior midline incision starting at handbreadth above the patella going over the patella 1 cm medial to and 4 cm distal to the tibial tubercle. Small bleeders in the skin and the subcutaneous tissue identified and cauterized. Transverse retinaculum was incised and reflected medially and a medial parapatellar arthrotomy was accomplished. the patella was everted and theprepatellar fat pad resected. The superficial medial collateral ligament was then elevated from anterior to posterior along the proximal flare of the tibia and anterior half of the menisci resected. The knee was hyperflexed exposing bone on bone arthritis. Peripheral and notch osteophytes as well as the cruciate ligaments were then resected. We continued to work  our way around posteriorly along the proximal tibia, and externally rotated the tibia subluxing it out from underneath the femur. A McHale PCL retractor was placed through the notch and a lateral Hohmann retractor placed, and we then entered the proximal tibia in line with the Depuy starter drill in line with the axis of the tibia followed by an intramedullary guide rod and 0-degree posterior slope cutting guide. The tibial cutting guide, 4 degree posterior sloped, was pinned into place allowing resection of 2 mm of bone medially and 10 mm of bone laterally. Satisfied with the tibial resection, we then entered the distal femur 2 mm anterior to the PCL origin with the intramedullary guide rod and applied the distal femoral cutting guide set at 9 mm, with 5 degrees of valgus. This was pinned along the epicondylar axis. At this point, the distal femoral cut was accomplished without difficulty.  We then sized for a #4N femoral component and pinned the guide in 3 degrees of external rotation. The chamfer cutting guide was pinned into place. The anterior, posterior, and chamfer cuts were accomplished without difficulty followed by the Attune RP box cutting guide and the box cut. We also removed posterior osteophytes from the posterior femoral condyles. The posterior capsule was injected with Exparel solution. The knee was brought into full extension. We checked our extension gap and fit a 6 mm bearing. Distracting in extension with a lamina spreader,  bleeders in the posterior capsule, Posterior medial and posterior lateral gutter were cauterized.  The transexamic acid-soaked sponge was then placed in the gap of the knee in extension. The knee was flexed 30. The posterior patella cut was accomplished with the 9.5 mm Attune cutting guide, sized for a 76mm dome, and the fixation pegs drilled.The knee was then once again hyperflexed exposing the proximal tibia. We sized for a # 4 tibial base plate, applied the smokestack and the conical reamer followed by the the Delta fin keel punch. We then hammered into place the Attune RP trial femoral component, drilled the lugs, inserted a  6 mm trial bearing, trial patellar button, and took the knee through range of motion from 0-130 degrees. Medial and lateral ligamentous stability was checked. No thumb pressure was required for patellar Tracking. The tourniquet was @50  min. All trial components were removed, mating surfaces irrigated with pulse lavage, and dried with suction and sponges. 10 cc of the Exparel solution was applied to the cancellus bone of the patella distal femur and proximal tibia.  After waiting 30 seconds, the bony surfaces were again, dried with sponges. A double batch of DePuy HV cement was mixed and applied to all bony metallic mating surfaces except for the posterior condyles of the femur itself. In order, we hammered into place the tibial tray  and removed excess cement, the femoral component and removed excess cement. The final Attune RP bearing was inserted, and the knee brought to full extension with compression. The patellar button was clamped into place, and excess cement removed. The knee was held at 30 flexion with compression, while the cement cured. The wound was irrigated out with normal saline solution pulse lavage. The rest of the Exparel was injected into the parapatellar arthrotomy, subcutaneous tissues, and periosteal tissues. The parapatellar arthrotomy was closed with running #1 Vicryl suture.  Interestingly at this point there appeared to be some form of tracking issue with the patella.  The patella appeared to raise up high on the lateral side and this continued to happen.  At this point was not sure what to do so we released the sutures.  I then reached the lateral side of the retinaculum and then reclose the medial side of the retinaculum.  This seemed to make patella tracking midline.  At this point I felt that this was reasonable approach for the issue at hand.  The subcutaneous tissue with 0 and 2-0 undyed Vicryl suture, and the skin with running 3-0 SQ vicryl. An Aquacil and Ace wrap were applied. The patient was taken to recovery room without difficulty.   Alta Corning 12/18/2018, 3:38 PM

## 2018-12-18 NOTE — Anesthesia Procedure Notes (Signed)
Spinal  Patient location during procedure: OR Start time: 12/18/2018 1:43 PM End time: 12/18/2018 1:47 PM Staffing Anesthesiologist: Roderic Palau, MD Performed: anesthesiologist  Preanesthetic Checklist Completed: patient identified, surgical consent, pre-op evaluation, timeout performed, IV checked, risks and benefits discussed and monitors and equipment checked Spinal Block Patient position: sitting Prep: DuraPrep Patient monitoring: cardiac monitor, continuous pulse ox and blood pressure Approach: midline Location: L3-4 Injection technique: single-shot Needle Needle type: Quincke (Attempted with Pencan. Unable to locate space.)  Needle gauge: 22 G Needle length: 9 cm Assessment Sensory level: T8 Additional Notes Functioning IV was confirmed and monitors were applied. Sterile prep and drape, including hand hygiene and sterile gloves were used. The patient was positioned and the spine was prepped. The skin was anesthetized with lidocaine.  Free flow of clear CSF was obtained prior to injecting local anesthetic into the CSF.  The spinal needle aspirated freely following injection.  The needle was carefully withdrawn.  The patient tolerated the procedure well. CRNA attempted at L4-5 prior to my attempt.

## 2018-12-19 DIAGNOSIS — D649 Anemia, unspecified: Secondary | ICD-10-CM | POA: Diagnosis not present

## 2018-12-19 DIAGNOSIS — Z951 Presence of aortocoronary bypass graft: Secondary | ICD-10-CM | POA: Diagnosis not present

## 2018-12-19 DIAGNOSIS — Z888 Allergy status to other drugs, medicaments and biological substances status: Secondary | ICD-10-CM | POA: Diagnosis not present

## 2018-12-19 DIAGNOSIS — Z87891 Personal history of nicotine dependence: Secondary | ICD-10-CM | POA: Diagnosis not present

## 2018-12-19 DIAGNOSIS — Z794 Long term (current) use of insulin: Secondary | ICD-10-CM | POA: Diagnosis not present

## 2018-12-19 DIAGNOSIS — E1140 Type 2 diabetes mellitus with diabetic neuropathy, unspecified: Secondary | ICD-10-CM | POA: Diagnosis not present

## 2018-12-19 DIAGNOSIS — E11319 Type 2 diabetes mellitus with unspecified diabetic retinopathy without macular edema: Secondary | ICD-10-CM | POA: Diagnosis not present

## 2018-12-19 DIAGNOSIS — I2510 Atherosclerotic heart disease of native coronary artery without angina pectoris: Secondary | ICD-10-CM | POA: Diagnosis not present

## 2018-12-19 DIAGNOSIS — Z79899 Other long term (current) drug therapy: Secondary | ICD-10-CM | POA: Diagnosis not present

## 2018-12-19 DIAGNOSIS — Z7982 Long term (current) use of aspirin: Secondary | ICD-10-CM | POA: Diagnosis not present

## 2018-12-19 DIAGNOSIS — M1712 Unilateral primary osteoarthritis, left knee: Secondary | ICD-10-CM | POA: Diagnosis not present

## 2018-12-19 DIAGNOSIS — E559 Vitamin D deficiency, unspecified: Secondary | ICD-10-CM | POA: Diagnosis not present

## 2018-12-19 DIAGNOSIS — I10 Essential (primary) hypertension: Secondary | ICD-10-CM | POA: Diagnosis not present

## 2018-12-19 DIAGNOSIS — E785 Hyperlipidemia, unspecified: Secondary | ICD-10-CM | POA: Diagnosis not present

## 2018-12-19 LAB — CBC
HCT: 34.2 % — ABNORMAL LOW (ref 39.0–52.0)
Hemoglobin: 10.4 g/dL — ABNORMAL LOW (ref 13.0–17.0)
MCH: 20.1 pg — ABNORMAL LOW (ref 26.0–34.0)
MCHC: 30.4 g/dL (ref 30.0–36.0)
MCV: 66 fL — ABNORMAL LOW (ref 80.0–100.0)
Platelets: 242 10*3/uL (ref 150–400)
RBC: 5.18 MIL/uL (ref 4.22–5.81)
RDW: 16.3 % — ABNORMAL HIGH (ref 11.5–15.5)
WBC: 11.5 10*3/uL — ABNORMAL HIGH (ref 4.0–10.5)
nRBC: 0 % (ref 0.0–0.2)

## 2018-12-19 LAB — GLUCOSE, CAPILLARY
Glucose-Capillary: 161 mg/dL — ABNORMAL HIGH (ref 70–99)
Glucose-Capillary: 243 mg/dL — ABNORMAL HIGH (ref 70–99)

## 2018-12-19 NOTE — Progress Notes (Signed)
Physical Therapy Treatment Patient Details Name: William Haney MRN: [id number] DOB: November 23, 1950 Today's Date: 12/19/2018    History of Present Illness Pt s/p L TKR and with hx of DM, CAD, CABG, diabetic retinopathy, and peripheral neuropathy    PT Comments    Pt progressing well with mobility including negotiating stairs.  Home therex program performed with written instruction provided and reviewed.   Follow Up Recommendations  Home health PT;Follow surgeon's recommendation for DC plan and follow-up therapies     Equipment Recommendations  Rolling walker with 5" wheels    Recommendations for Other Services       Precautions / Restrictions Precautions Precautions: Fall Restrictions Weight Bearing Restrictions: No Other Position/Activity Restrictions: WBAT    Mobility  Bed Mobility Overal bed mobility: Needs Assistance Bed Mobility: Sit to Supine       Sit to supine: Min guard;Supervision      Transfers Overall transfer level: Needs assistance Equipment used: Rolling walker (2 wheeled) Transfers: Sit to/from Stand Sit to Stand: Supervision         General transfer comment: cues for LE management and use of UEs to self assist  Ambulation/Gait Ambulation/Gait assistance: Min guard;Supervision Gait Distance (Feet): 120 Feet Assistive device: Rolling walker (2 wheeled) Gait Pattern/deviations: Step-to pattern;Step-through pattern;Decreased step length - right;Decreased step length - left;Shuffle;Trunk flexed Gait velocity: decr   General Gait Details: cues for posture, position from RW and initial sequence   Stairs Stairs: Yes Stairs assistance: Min assist Stair Management: No rails;Step to pattern;Forwards;Backwards;With walker Number of Stairs: 5 General stair comments: 2 step bkwd with RW; single step bkwd with RW; single step twice fwd with RW; VC for sequence   Wheelchair Mobility    Modified Rankin (Stroke Patients Only)       Balance  Overall balance assessment: Mild deficits observed, not formally tested                                          Cognition Arousal/Alertness: Awake/alert Behavior During Therapy: WFL for tasks assessed/performed Overall Cognitive Status: Within Functional Limits for tasks assessed                                        Exercises Total Joint Exercises Ankle Circles/Pumps: AROM;Both;20 reps;Supine Quad Sets: AROM;Both;10 reps;Supine Heel Slides: AAROM;Left;15 reps;Supine Straight Leg Raises: AAROM;AROM;Left;15 reps;Supine Long Arc Quad: AROM;10 reps;Seated;Left    General Comments        Pertinent Vitals/Pain Pain Assessment: 0-10 Pain Score: 6  Pain Location: L knee Pain Descriptors / Indicators: Aching;Sore Pain Intervention(s): Limited activity within patient's tolerance;Monitored during session;Premedicated before session;Patient requesting pain meds-RN notified    Home Living                      Prior Function            PT Goals (current goals can now be found in the care plan section) Acute Rehab PT Goals Patient Stated Goal: Regain IND PT Goal Formulation: With patient Time For Goal Achievement: 12/26/18 Potential to Achieve Goals: Good Progress towards PT goals: Progressing toward goals    Frequency    7X/week      PT Plan Current plan remains appropriate    Co-evaluation  AM-PAC PT "6 Clicks" Mobility   Outcome Measure  Help needed turning from your back to your side while in a flat bed without using bedrails?: A Little Help needed moving from lying on your back to sitting on the side of a flat bed without using bedrails?: A Little Help needed moving to and from a bed to a chair (including a wheelchair)?: A Little Help needed standing up from a chair using your arms (e.g., wheelchair or bedside chair)?: A Little Help needed to walk in hospital room?: A Little Help needed climbing 3-5  steps with a railing? : A Little 6 Click Score: 18    End of Session Equipment Utilized During Treatment: Gait belt Activity Tolerance: Patient tolerated treatment well Patient left: with call bell/phone within reach;in bed Nurse Communication: Mobility status PT Visit Diagnosis: Difficulty in walking, not elsewhere classified (R26.2)     Time: 1532-1610 PT Time Calculation (min) (ACUTE ONLY): 38 min  Charges:  $Gait Training: 8-22 mins $Therapeutic Exercise: 8-22 mins                     [place] Pager [phone] Office [phone]    Valincia Touch 12/19/2018, 5:16 PM

## 2018-12-19 NOTE — Discharge Summary (Signed)
Patient ID: Lymon Kidney MRN: [id number] DOB/AGE: 70-12-1950 70 y.o.  Admit date: 12/18/2018 Discharge date: 12/19/2018  Admission Diagnoses:  Principal Problem:   Primary osteoarthritis of left knee   Discharge Diagnoses:  Same  Past Medical History:  Diagnosis Date  . Anemia   . Carotid artery stenosis 01/2017   L-ICA 1-39%, R-ICA 40-59% doppler  . Coronary artery disease    s/p CABG in 2008, w/ LIMA-LAD, SVG-Diag, SVG-OM, SVG-PL  . Diabetes mellitus type II   . Hyperlipidemia   . Hypertension   . Vitamin D deficiency     Surgeries: Procedure(s): TOTAL KNEE ARTHROPLASTY on 12/18/2018   Consultants:   Discharged Condition: Improved  Hospital Course: Creston Klas is an 68 y.o. male who was admitted 12/18/2018 for operative treatment ofPrimary osteoarthritis of left knee. Patient has severe unremitting pain that affects sleep, daily activities, and work/hobbies. After pre-op clearance the patient was taken to the operating room on 12/18/2018 and underwent  Procedure(s): TOTAL KNEE ARTHROPLASTY.    Patient was given perioperative antibiotics:  Anti-infectives (From admission, onward)   Start     Dose/Rate Route Frequency Ordered Stop   12/18/18 2000  ceFAZolin (ANCEF) IVPB 2g/100 mL premix     2 g 200 mL/hr over 30 Minutes Intravenous Every 6 hours 12/18/18 1802 12/19/18 0334   12/18/18 1045  ceFAZolin (ANCEF) IVPB 2g/100 mL premix     2 g 200 mL/hr over 30 Minutes Intravenous On call to O.R. 12/18/18 1035 12/18/18 1417       Patient was given sequential compression devices, early ambulation, and chemoprophylaxis to prevent DVT.  Patient benefited maximally from hospital stay and there were no complications.    Recent vital signs:  Patient Vitals for the past 24 hrs:  BP Temp Temp src Pulse Resp SpO2 Height Weight  12/19/18 0541 102/62 97.8 F (36.6 C) Oral 61 18 97 % - -  12/19/18 0128 116/75 98.4 F (36.9 C) Oral 79 17 98 % - -  12/18/18 2136 124/79 98.6 F (37  C) Oral 93 17 98 % - -  12/18/18 2000 113/63 97.9 F (36.6 C) Oral 94 16 94 % - -  12/18/18 1856 129/78 - - 85 14 98 % - -  12/18/18 1804 125/82 - - 84 15 95 % - -  12/18/18 1745 129/76 (!) 97.5 F (36.4 C) - 70 18 97 % - -  12/18/18 1730 135/80 - - 78 17 96 % - -  12/18/18 1715 136/75 - - 69 19 96 % - -  12/18/18 1700 129/72 - - 69 17 97 % - -  12/18/18 1645 125/72 - - 67 14 97 % - -  12/18/18 1630 127/76 - - 62 13 93 % - -  12/18/18 1615 126/66 - - 72 17 94 % - -  12/18/18 1600 119/75 97.6 F (36.4 C) - 70 15 100 % - -  12/18/18 1232 117/70 - - 61 17 100 % - -  12/18/18 1200 (!) 99/58 - - 66 15 100 % - -  12/18/18 1147 (!) 100/56 - - (!) 59 16 100 % - -  12/18/18 1132 91/60 - - (!) 58 13 100 % - -  12/18/18 1127 (!) 103/57 - - (!) 58 12 99 % - -  12/18/18 1122 99/61 - - (!) 55 11 98 % - -  12/18/18 1117 (!) 88/58 - - (!) 56 12 97 % - -  12/18/18 1112 (!) 115/99 - - [name])  58 13 98 % - -  12/18/18 1056 - - - - - - _0  (1.549 m) 59.4 kg  12/18/18 1050 137/85 98.3 F (36.8 C) Oral 62 16 98 % - -     Recent laboratory studies:  Recent Labs    12/18/18 1050 12/19/18 0335  WBC  --  11.5*  HGB  --  10.4*  HCT  --  34.2*  PLT  --  242  NA 134*  --   K 4.4  --   CL 102  --   CO2 23  --   BUN 13  --   CREATININE 0.94  --   GLUCOSE 170*  --   CALCIUM 9.1  --      Discharge Medications:   Allergies as of 12/19/2018      Reactions   Ace Inhibitors Cough   Statins Other (See Comments)   Elevated LFT's      Medication List    STOP taking these medications   aspirin 81 MG tablet Replaced by: aspirin EC 325 MG tablet   Insulin Detemir 100 UNIT/ML Pen Commonly known as: LEVEMIR   NovoFine 32G X 6 MM Misc Generic drug: Insulin Pen Needle   Tresiba 100 UNIT/ML Soln Generic drug: Insulin Degludec     TAKE these medications   aspirin EC 325 MG tablet Take 1 tablet (325 mg total) by mouth 2 (two) times daily after a meal. Take x 1 month post op to decrease risk of  blood clots. Replaces: aspirin 81 MG tablet   blood glucose meter kit and supplies Test sugars once to twice daily   Cinnamon 500 MG capsule Take 2 capsules (1,000 mg total) by mouth 2 (two) times daily. What changed:   how much to take  when to take this   docusate sodium 100 MG capsule Commonly known as: Colace Take 1 capsule (100 mg total) by mouth 2 (two) times daily.   glimepiride 4 MG tablet Commonly known as: AMARYL Take 1 tablet 2 x / day for Diabetes What changed:   how much to take  how to take this  when to take this  additional instructions   Jardiance 10 MG Tabs tablet Generic drug: empagliflozin Take 10 mg by mouth daily. What changed: Another medication with the same name was removed. Continue taking this medication, and follow the directions you see here.   metFORMIN 500 MG tablet Commonly known as: GLUCOPHAGE TAKE 2 TABLETS BY MOUTH EVERY MORNING AND 2 EVERY EVENING What changed: See the new instructions.   metoprolol tartrate 25 MG tablet Commonly known as: LOPRESSOR Take 1 tablet 2 x /day for BP What changed:   how much to take  how to take this  when to take this  additional instructions   multivitamin tablet Take 1 tablet by mouth daily.   oxyCODONE-acetaminophen 5-325 MG tablet Commonly known as: PERCOCET/ROXICET Take 1-2 tablets by mouth every 6 (six) hours as needed for severe pain.   rosuvastatin 20 MG tablet Commonly known as: CRESTOR Take 1 tablet Daily for Cholesterol What changed:   how much to take  how to take this  when to take this  additional instructions   telmisartan 20 MG tablet Commonly known as: MICARDIS TAKE 1 TABLET DAILY FOR BP & KIDNEY PROTECTION What changed: See the new instructions.   tiZANidine 2 MG tablet Commonly known as: ZANAFLEX Take 1 tablet (2 mg total) by mouth every 8 (eight) hours as needed for muscle  spasms.   Vitamin D3 125 MCG (5000 UT) Caps Take 5,000 Units by mouth 4  (four) times a week.            Durable Medical Equipment  (From admission, onward)         Start     Ordered   12/19/18 0857  For home use only DME 3 n 1  Once     12/19/18 0857   12/19/18 0857  For home use only DME Walker rolling  Once    Question:  Patient needs a walker to treat with the following condition  Answer:  History of total knee replacement (TKR)   12/19/18 0857   12/19/18 0857  For home use only DME Bedside commode  Once    Question:  Patient needs a bedside commode to treat with the following condition  Answer:  History of total knee replacement (TKR)   12/19/18 0857          Diagnostic Studies: Dg Chest 2 View  Result Date: 12/15/2018 CLINICAL DATA:  01/29/2017 EXAM: CHEST - 2 VIEW COMPARISON:  Preop knee replacement FINDINGS: There is no focal consolidation. There is no pleural effusion or pneumothorax. The heart and mediastinal contours are unremarkable. There is evidence of prior CABG. There is no acute osseous abnormality. IMPRESSION: No active cardiopulmonary disease. Electronically Signed   By: Kathreen Devoid   On: 12/15/2018 16:49    Disposition: Discharge disposition: 01-Home or Self Care       Discharge Instructions    Call MD / Call 911   Complete by: As directed    If you experience chest pain or shortness of breath, CALL 911 and be transported to the hospital emergency room.  If you develope a fever above 101 F, pus (white drainage) or increased drainage or redness at the wound, or calf pain, call your surgeon's office.   Constipation Prevention   Complete by: As directed    Drink plenty of fluids.  Prune juice may be helpful.  You may use a stool softener, such as Colace (over the counter) 100 mg twice a day.  Use MiraLax (over the counter) for constipation as needed.   Diet - low sodium heart healthy   Complete by: As directed    Discharge instructions   Complete by: As directed    INSTRUCTIONS AFTER JOINT REPLACEMENT   Remove items at  home which could result in a fall. This includes throw rugs or furniture in walking pathways ICE to the affected joint every three hours while awake for 30 minutes at a time, for at least the first 3-5 days, and then as needed for pain and swelling.  Continue to use ice for pain and swelling. You may notice swelling that will progress down to the foot and ankle.  This is normal after surgery.  Elevate your leg when you are not up walking on it.   Continue to use the breathing machine you got in the hospital (incentive spirometer) which will help keep your temperature down.  It is common for your temperature to cycle up and down following surgery, especially at night when you are not up moving around and exerting yourself.  The breathing machine keeps your lungs expanded and your temperature down.   DIET:  As you were doing prior to hospitalization, we recommend a well-balanced diet.  DRESSING / WOUND CARE / SHOWERING  You may shower 3 days after surgery, but keep the wounds dry during showering.  You  may use an occlusive plastic wrap (Press'n Seal for example), NO SOAKING/SUBMERGING IN THE BATHTUB.  If the bandage gets wet, change with a clean dry gauze.  If the incision gets wet, pat the wound dry with a clean towel.  ACTIVITY  Increase activity slowly as tolerated, but follow the weight bearing instructions below.   No driving for 6 weeks or until further direction given by your physician.  You cannot drive while taking narcotics.  No lifting or carrying greater than 10 lbs. until further directed by your surgeon. Avoid periods of inactivity such as sitting longer than an hour when not asleep. This helps prevent blood clots.  You may return to work once you are authorized by your doctor.     WEIGHT BEARING   Weight bearing as tolerated with assist device (walker, cane, etc) as directed, use it as long as suggested by your surgeon or therapist, typically at least 4-6  weeks.   EXERCISES  Results after joint replacement surgery are often greatly improved when you follow the exercise, range of motion and muscle strengthening exercises prescribed by your doctor. Safety measures are also important to protect the joint from further injury. Any time any of these exercises cause you to have increased pain or swelling, decrease what you are doing until you are comfortable again and then slowly increase them. If you have problems or questions, call your caregiver or physical therapist for advice.   Rehabilitation is important following a joint replacement. After just a few days of immobilization, the muscles of the leg can become weakened and shrink (atrophy).  These exercises are designed to build up the tone and strength of the thigh and leg muscles and to improve motion. Often times heat used for twenty to thirty minutes before working out will loosen up your tissues and help with improving the range of motion but do not use heat for the first two weeks following surgery (sometimes heat can increase post-operative swelling).   These exercises can be done on a training (exercise) mat, on the floor, on a table or on a bed. Use whatever works the best and is most comfortable for you.    Use music or television while you are exercising so that the exercises are a pleasant break in your day. This will make your life better with the exercises acting as a break in your routine that you can look forward to.   Perform all exercises about fifteen times, three times per day or as directed.  You should exercise both the operative leg and the other leg as well.   Exercises include:   Quad Sets - Tighten up the muscle on the front of the thigh (Quad) and hold for 5-10 seconds.   Straight Leg Raises - With your knee straight (if you were given a brace, keep it on), lift the leg to 60 degrees, hold for 3 seconds, and slowly lower the leg.  Perform this exercise against resistance later  as your leg gets stronger.  Leg Slides: Lying on your back, slowly slide your foot toward your buttocks, bending your knee up off the floor (only go as far as is comfortable). Then slowly slide your foot back down until your leg is flat on the floor again.  Angel Wings: Lying on your back spread your legs to the side as far apart as you can without causing discomfort.  Hamstring Strength:  Lying on your back, push your heel against the floor with your leg straight  by tightening up the muscles of your buttocks.  Repeat, but this time bend your knee to a comfortable angle, and push your heel against the floor.  You may put a pillow under the heel to make it more comfortable if necessary.   A rehabilitation program following joint replacement surgery can speed recovery and prevent re-injury in the future due to weakened muscles. Contact your doctor or a physical therapist for more information on knee rehabilitation.    CONSTIPATION  Constipation is defined medically as fewer than three stools per week and severe constipation as less than one stool per week.  Even if you have a regular bowel pattern at home, your normal regimen is likely to be disrupted due to multiple reasons following surgery.  Combination of anesthesia, postoperative narcotics, change in appetite and fluid intake all can affect your bowels.   YOU MUST use at least one of the following options; they are listed in order of increasing strength to get the job done.  They are all available over the counter, and you may need to use some, POSSIBLY even all of these options:    Drink plenty of fluids (prune juice may be helpful) and high fiber foods Colace 100 mg by mouth twice a day  Senokot for constipation as directed and as needed Dulcolax (bisacodyl), take with full glass of water  Miralax (polyethylene glycol) once or twice a day as needed.  If you have tried all these things and are unable to have a bowel movement in the first 3-4  days after surgery call either your surgeon or your primary doctor.    If you experience loose stools or diarrhea, hold the medications until you stool forms back up.  If your symptoms do not get better within 1 week or if they get worse, check with your doctor.  If you experience "the worst abdominal pain ever" or develop nausea or vomiting, please contact the office immediately for further recommendations for treatment.   ITCHING:  If you experience itching with your medications, try taking only a single pain pill, or even half a pain pill at a time.  You can also use Benadryl over the counter for itching or also to help with sleep.   TED HOSE STOCKINGS:  Use stockings on both legs until for at least 2 weeks or as directed by physician office. They may be removed at night for sleeping.  MEDICATIONS:  See your medication summary on the "After Visit Summary" that nursing will review with you.  You may have some home medications which will be placed on hold until you complete the course of blood thinner medication.  It is important for you to complete the blood thinner medication as prescribed.  PRECAUTIONS:  If you experience chest pain or shortness of breath - call 911 immediately for transfer to the hospital emergency department.   If you develop a fever greater that 101 F, purulent drainage from wound, increased redness or drainage from wound, foul odor from the wound/dressing, or calf pain - CONTACT YOUR SURGEON.                                                   FOLLOW-UP APPOINTMENTS:  If you do not already have a post-op appointment, please call the office for an appointment to be seen by your surgeon.  Guidelines for how soon to be seen are listed in your "After Visit Summary", but are typically between 1-4 weeks after surgery.  OTHER INSTRUCTIONS:   Knee Replacement:  Do not place pillow under knee, focus on keeping the knee straight while resting. CPM instructions: 0-90 degrees, 2 hours  in the morning, 2 hours in the afternoon, and 2 hours in the evening. Place foam block, curve side up under heel at all times except when in CPM or when walking.  DO NOT modify, tear, cut, or change the foam block in any way.  MAKE SURE YOU:  Understand these instructions.  Get help right away if you are not doing well or get worse.    Thank you for letting us be a part of your medical care team.  It is a privilege we respect greatly.  We hope these instructions will help you stay on track for a fast and full recovery!   Increase activity slowly as tolerated   Complete by: As directed       Follow-up Information    Dorna Leitz, MD. Go on 12/31/2018.   Specialty: Orthopedic Surgery Why: Your appointment is scheduled fo 1045 Contact information: [place] [address] [phone]        [facility] Follow up.   Specialty: [place] Why: you will be seen at home by [facility] [facility] for 5 visits prior to starting outpatient physical therapy  Contact information: [address] [place] [address] [phone]        [facility], [place]. Go on 12/31/2018.   Why: You are scheduled to start Outpatient physical therapy at 1200. Please go over to the therapy desk after your MD appointment to complete your paperwork  Contact information: [facility] [place] [address] [phone]            Signed: Larwance Sachs Tyrone Pautsch 12/19/2018, 8:57 AM

## 2018-12-19 NOTE — TOC Transition Note (Signed)
Transition of Care [facility]) - CM/SW Discharge Note   Patient Details  Name: William Haney MRN: [id number] Date of Birth: 1950-12-18  Transition of Care [facility]) CM/SW Contact:  Dessa Phi, RN Phone Number: 12/19/2018, 11:13 AM   Clinical Narrative: [employer] rep [place] to provide YRW to be delivered to rm prior d/c. Patient has 3n1. [place] already following. No further CM needs.            Patient Goals and CMS Choice        Discharge Placement                       Discharge Plan and Services                DME Arranged: (YRW 25ft 1 inch wt 59.4kg) DME Agency: [employer] Date DME Agency Contacted: 12/19/18 Time DME Agency Contacted: 1112 Representative spoke with at DME Agency: Annetta [place]: PT [place]: [facility]        Social Determinants of Health ([place]) Interventions     Readmission Risk Interventions No flowsheet data found.

## 2018-12-19 NOTE — Progress Notes (Signed)
Subjective: 1 Day Post-Op Procedure(s) (LRB): TOTAL KNEE ARTHROPLASTY (Left) Patient feeling well and looking forward to going home. Activity level:  wbat Diet tolerance:  ok Voiding:  ok Patient reports pain as mild.    Objective: Vital signs in last 24 hours: Temp:  [97.5 F (36.4 C)-98.6 F (37 C)] 97.8 F (36.6 C) (10/10 0541) Pulse Rate:  [55-94] 61 (10/10 0541) Resp:  [11-19] 18 (10/10 0541) BP: (88-137)/(56-99) 102/62 (10/10 0541) SpO2:  [93 %-100 %] 97 % (10/10 0541) Weight:  [59.4 kg] 59.4 kg (10/09 1056)  Labs: Recent Labs    12/19/18 0335  HGB 10.4*   Recent Labs    12/19/18 0335  WBC 11.5*  RBC 5.18  HCT 34.2*  PLT 242   Recent Labs    12/18/18 1050  NA 134*  K 4.4  CL 102  CO2 23  BUN 13  CREATININE 0.94  GLUCOSE 170*  CALCIUM 9.1   No results for input(s): LABPT, INR in the last 72 hours.  Physical Exam:  Neurologically intact ABD soft Neurovascular intact Sensation intact distally Intact pulses distally Dorsiflexion/Plantar flexion intact Incision: dressing C/D/I and no drainage No cellulitis present Compartment soft  Assessment/Plan:  1 Day Post-Op Procedure(s) (LRB): TOTAL KNEE ARTHROPLASTY (Left) Advance diet Up with therapy Discharge home with home health after PT today.  Follow up in office 2 weeks post op. Continue on asa for DVT prevention.    William Haney 12/19/2018, 8:54 AM

## 2018-12-19 NOTE — Plan of Care (Signed)
Pt stable at this time with no needs. Pt to d/c home soon with no needs. Pt has home equipment at bedside.

## 2018-12-19 NOTE — Evaluation (Signed)
Physical Therapy Evaluation Patient Details Name: William Haney MRN: [id number] DOB: 06-18-1950 Today's Date: 12/19/2018   History of Present Illness  Pt s/p L TKR and with hx of DM, CAD, CABG, diabetic retinopathy, and peripheral neuropathy  Clinical Impression  Pt s/p L TKR and presents with decreased L LE strength/ROM and post op pain limiting functional mobility.  Pt should progress to dc home with family assist and HHPT follow up.    Follow Up Recommendations Home health PT;Follow surgeon's recommendation for DC plan and follow-up therapies    Equipment Recommendations  Rolling walker with 5" wheels(youth level RW please)    Recommendations for Other Services       Precautions / Restrictions Precautions Precautions: Fall Restrictions Weight Bearing Restrictions: No Other Position/Activity Restrictions: WBAT      Mobility  Bed Mobility Overal bed mobility: Needs Assistance Bed Mobility: Supine to Sit     Supine to sit: Min guard        Transfers Overall transfer level: Needs assistance Equipment used: Rolling walker (2 wheeled) Transfers: Sit to/from Stand Sit to Stand: Min guard         General transfer comment: cues for LE management and use of UEs to self assist  Ambulation/Gait Ambulation/Gait assistance: Min assist;Min guard Gait Distance (Feet): 120 Feet Assistive device: Rolling walker (2 wheeled) Gait Pattern/deviations: Step-to pattern;Step-through pattern;Decreased step length - right;Decreased step length - left;Shuffle;Trunk flexed Gait velocity: decr   General Gait Details: cues for posture, position from RW and initial sequence  Stairs            Wheelchair Mobility    Modified Rankin (Stroke Patients Only)       Balance Overall balance assessment: Mild deficits observed, not formally tested                                           Pertinent Vitals/Pain Pain Assessment: 0-10 Pain Score: 5  Pain  Location: L knee Pain Descriptors / Indicators: Aching;Sore Pain Intervention(s): Limited activity within patient's tolerance;Monitored during session;Premedicated before session;Ice applied    Home Living Family/patient expects to be discharged to:: Private residence Living Arrangements: Spouse/significant other;Children Available Help at Discharge: Family Type of Home: House Home Access: Stairs to enter Entrance Stairs-Rails: None Technical brewer of Steps: 2 Home Layout: One level Home Equipment: Cane - single point      Prior Function Level of Independence: Independent               Hand Dominance        Extremity/Trunk Assessment   Upper Extremity Assessment Upper Extremity Assessment: Overall WFL for tasks assessed    Lower Extremity Assessment Lower Extremity Assessment: LLE deficits/detail LLE Deficits / Details: AAROM at knee -5 - 115 with 3/5 quads and IND SLR    Cervical / Trunk Assessment Cervical / Trunk Assessment: Normal  Communication   Communication: No difficulties  Cognition Arousal/Alertness: Awake/alert Behavior During Therapy: WFL for tasks assessed/performed Overall Cognitive Status: Within Functional Limits for tasks assessed                                        General Comments      Exercises Total Joint Exercises Ankle Circles/Pumps: AROM;Both;20 reps;Supine Quad Sets: AROM;Both;10 reps;Supine Heel Slides: AAROM;Left;15 reps;Supine Straight Leg  Raises: AAROM;AROM;Left;15 reps;Supine   Assessment/Plan    PT Assessment Patient needs continued PT services  PT Problem List Decreased strength;Decreased range of motion;Decreased activity tolerance;Decreased mobility;Decreased knowledge of use of DME;Pain       PT Treatment Interventions DME instruction;Gait training;Stair training;Functional mobility training;Therapeutic activities;Therapeutic exercise;Patient/family education    PT Goals (Current goals  can be found in the Care Plan section)  Acute Rehab PT Goals Patient Stated Goal: Regain IND PT Goal Formulation: With patient Time For Goal Achievement: 12/26/18 Potential to Achieve Goals: Good    Frequency 7X/week   Barriers to discharge        Co-evaluation               AM-PAC PT "6 Clicks" Mobility  Outcome Measure Help needed turning from your back to your side while in a flat bed without using bedrails?: A Little Help needed moving from lying on your back to sitting on the side of a flat bed without using bedrails?: A Little Help needed moving to and from a bed to a chair (including a wheelchair)?: A Little Help needed standing up from a chair using your arms (e.g., wheelchair or bedside chair)?: A Little Help needed to walk in hospital room?: A Little Help needed climbing 3-5 steps with a railing? : A Little 6 Click Score: 18    End of Session Equipment Utilized During Treatment: Gait belt Activity Tolerance: Patient tolerated treatment well Patient left: in chair;with call bell/phone within reach;with chair alarm set Nurse Communication: Mobility status PT Visit Diagnosis: Difficulty in walking, not elsewhere classified (R26.2)    Time: [id number] PT Time Calculation (min) (ACUTE ONLY): 41 min   Charges:   PT Evaluation $PT Eval Low Complexity: 1 Low PT Treatments $Gait Training: 8-22 mins $Therapeutic Exercise: 8-22 mins        Debe Coder PT Acute Rehabilitation Services Pager [phone] Office [phone]   Almeter Westhoff 12/19/2018, 12:21 PM

## 2018-12-19 NOTE — Plan of Care (Signed)
Pt stable this am. Pt hoping to d/c home after second physical therapy session after lunch.

## 2018-12-21 ENCOUNTER — Encounter (HOSPITAL_COMMUNITY): Payer: Self-pay | Admitting: Orthopedic Surgery

## 2018-12-21 DIAGNOSIS — I6529 Occlusion and stenosis of unspecified carotid artery: Secondary | ICD-10-CM | POA: Diagnosis not present

## 2018-12-21 DIAGNOSIS — Z87891 Personal history of nicotine dependence: Secondary | ICD-10-CM | POA: Diagnosis not present

## 2018-12-21 DIAGNOSIS — I2510 Atherosclerotic heart disease of native coronary artery without angina pectoris: Secondary | ICD-10-CM | POA: Diagnosis not present

## 2018-12-21 DIAGNOSIS — Z471 Aftercare following joint replacement surgery: Secondary | ICD-10-CM | POA: Diagnosis not present

## 2018-12-21 DIAGNOSIS — E11319 Type 2 diabetes mellitus with unspecified diabetic retinopathy without macular edema: Secondary | ICD-10-CM | POA: Diagnosis not present

## 2018-12-21 DIAGNOSIS — D509 Iron deficiency anemia, unspecified: Secondary | ICD-10-CM | POA: Diagnosis not present

## 2018-12-21 DIAGNOSIS — E1140 Type 2 diabetes mellitus with diabetic neuropathy, unspecified: Secondary | ICD-10-CM | POA: Diagnosis not present

## 2018-12-21 DIAGNOSIS — E559 Vitamin D deficiency, unspecified: Secondary | ICD-10-CM | POA: Diagnosis not present

## 2018-12-21 DIAGNOSIS — Z96652 Presence of left artificial knee joint: Secondary | ICD-10-CM | POA: Diagnosis not present

## 2018-12-21 DIAGNOSIS — Z7984 Long term (current) use of oral hypoglycemic drugs: Secondary | ICD-10-CM | POA: Diagnosis not present

## 2018-12-21 DIAGNOSIS — I10 Essential (primary) hypertension: Secondary | ICD-10-CM | POA: Diagnosis not present

## 2018-12-21 DIAGNOSIS — E785 Hyperlipidemia, unspecified: Secondary | ICD-10-CM | POA: Diagnosis not present

## 2018-12-21 DIAGNOSIS — M1712 Unilateral primary osteoarthritis, left knee: Secondary | ICD-10-CM | POA: Diagnosis not present

## 2018-12-22 ENCOUNTER — Telehealth: Payer: Self-pay | Admitting: *Deleted

## 2018-12-22 NOTE — Telephone Encounter (Signed)
Called patient on 12/22/2018 , 9:36 AM in an attempt to reach the patient for a hospital follow up.   Admit date: 12/18/18 Discharge: 12/19/18   He does not have any questions or concerns about medications from the hospital admission. The patient's medications were reviewed over the phone, they were counseled to bring in all current medications to the hospital follow up visit.   I advised the patient to call if any questions or concerns arise about the hospital admission or medications    Home health was started in the hospital. He is having in the home physical therapy, which will start on 12/23/2018. All questions were answered and a follow up appointment was made. Patient has an appointment on 01/01/2019 with Liane Comber, NP.   Prior to Admission medications   Medication Sig Start Date End Date Taking? Authorizing Provider  aspirin EC 325 MG tablet Take 1 tablet (325 mg total) by mouth 2 (two) times daily after a meal. Take x 1 month post op to decrease risk of blood clots. 12/18/18   Gary Fleet, PA-C  blood glucose meter kit and supplies Test sugars once to twice daily 08/24/18   [name], PA-C  Cholecalciferol (VITAMIN D3) 125 MCG (5000 UT) CAPS Take 5,000 Units by mouth 4 (four) times a week.     [provider]  Cinnamon 500 MG capsule Take 2 capsules (1,000 mg total) by mouth 2 (two) times daily. Patient taking differently: Take 500 mg by mouth daily.  02/05/17   Liane Comber, NP  docusate sodium (COLACE) 100 MG capsule Take 1 capsule (100 mg total) by mouth 2 (two) times daily. 12/18/18   Gary Fleet, PA-C  empagliflozin (JARDIANCE) 10 MG TABS tablet Take 10 mg by mouth daily.    [provider]  glimepiride (AMARYL) 4 MG tablet Take 1 tablet 2 x / day for Diabetes Patient taking differently: Take 4 mg by mouth daily with breakfast.  07/06/18   Unk Pinto, MD  metFORMIN (GLUCOPHAGE) 500 MG tablet TAKE 2 TABLETS BY MOUTH EVERY MORNING AND 2 EVERY  EVENING Patient taking differently: Take 1,000 mg by mouth 2 (two) times daily with a meal.  08/03/18   Liane Comber, NP  metoprolol tartrate (LOPRESSOR) 25 MG tablet Take 1 tablet 2 x /day for BP Patient taking differently: Take 25 mg by mouth 2 (two) times daily.  08/23/18   Unk Pinto, MD  Multiple Vitamin (MULTIVITAMIN) tablet Take 1 tablet by mouth daily.    [provider]  oxyCODONE-acetaminophen (PERCOCET/ROXICET) 5-325 MG tablet Take 1-2 tablets by mouth every 6 (six) hours as needed for severe pain. 12/18/18   Gary Fleet, PA-C  rosuvastatin (CRESTOR) 20 MG tablet Take 1 tablet Daily for Cholesterol Patient taking differently: Take 20 mg by mouth at bedtime.  11/17/18   Unk Pinto, MD  telmisartan (MICARDIS) 20 MG tablet TAKE 1 TABLET DAILY FOR BP & KIDNEY PROTECTION Patient taking differently: Take 20 mg by mouth daily.  08/24/18   Liane Comber, NP  tiZANidine (ZANAFLEX) 2 MG tablet Take 1 tablet (2 mg total) by mouth every 8 (eight) hours as needed for muscle spasms. 12/18/18   Gary Fleet, PA-C

## 2018-12-23 DIAGNOSIS — E1140 Type 2 diabetes mellitus with diabetic neuropathy, unspecified: Secondary | ICD-10-CM | POA: Diagnosis not present

## 2018-12-23 DIAGNOSIS — I10 Essential (primary) hypertension: Secondary | ICD-10-CM | POA: Diagnosis not present

## 2018-12-23 DIAGNOSIS — Z7984 Long term (current) use of oral hypoglycemic drugs: Secondary | ICD-10-CM | POA: Diagnosis not present

## 2018-12-23 DIAGNOSIS — E785 Hyperlipidemia, unspecified: Secondary | ICD-10-CM | POA: Diagnosis not present

## 2018-12-23 DIAGNOSIS — Z471 Aftercare following joint replacement surgery: Secondary | ICD-10-CM | POA: Diagnosis not present

## 2018-12-23 DIAGNOSIS — Z96652 Presence of left artificial knee joint: Secondary | ICD-10-CM | POA: Diagnosis not present

## 2018-12-23 DIAGNOSIS — E559 Vitamin D deficiency, unspecified: Secondary | ICD-10-CM | POA: Diagnosis not present

## 2018-12-23 DIAGNOSIS — I2510 Atherosclerotic heart disease of native coronary artery without angina pectoris: Secondary | ICD-10-CM | POA: Diagnosis not present

## 2018-12-23 DIAGNOSIS — E11319 Type 2 diabetes mellitus with unspecified diabetic retinopathy without macular edema: Secondary | ICD-10-CM | POA: Diagnosis not present

## 2018-12-23 DIAGNOSIS — I6529 Occlusion and stenosis of unspecified carotid artery: Secondary | ICD-10-CM | POA: Diagnosis not present

## 2018-12-23 DIAGNOSIS — D509 Iron deficiency anemia, unspecified: Secondary | ICD-10-CM | POA: Diagnosis not present

## 2018-12-23 DIAGNOSIS — Z87891 Personal history of nicotine dependence: Secondary | ICD-10-CM | POA: Diagnosis not present

## 2018-12-25 DIAGNOSIS — Z471 Aftercare following joint replacement surgery: Secondary | ICD-10-CM | POA: Diagnosis not present

## 2018-12-25 DIAGNOSIS — E11319 Type 2 diabetes mellitus with unspecified diabetic retinopathy without macular edema: Secondary | ICD-10-CM | POA: Diagnosis not present

## 2018-12-25 DIAGNOSIS — D509 Iron deficiency anemia, unspecified: Secondary | ICD-10-CM | POA: Diagnosis not present

## 2018-12-25 DIAGNOSIS — E1140 Type 2 diabetes mellitus with diabetic neuropathy, unspecified: Secondary | ICD-10-CM | POA: Diagnosis not present

## 2018-12-25 DIAGNOSIS — Z7984 Long term (current) use of oral hypoglycemic drugs: Secondary | ICD-10-CM | POA: Diagnosis not present

## 2018-12-25 DIAGNOSIS — I2510 Atherosclerotic heart disease of native coronary artery without angina pectoris: Secondary | ICD-10-CM | POA: Diagnosis not present

## 2018-12-25 DIAGNOSIS — Z87891 Personal history of nicotine dependence: Secondary | ICD-10-CM | POA: Diagnosis not present

## 2018-12-25 DIAGNOSIS — I10 Essential (primary) hypertension: Secondary | ICD-10-CM | POA: Diagnosis not present

## 2018-12-25 DIAGNOSIS — I6529 Occlusion and stenosis of unspecified carotid artery: Secondary | ICD-10-CM | POA: Diagnosis not present

## 2018-12-25 DIAGNOSIS — Z96652 Presence of left artificial knee joint: Secondary | ICD-10-CM | POA: Diagnosis not present

## 2018-12-25 DIAGNOSIS — E785 Hyperlipidemia, unspecified: Secondary | ICD-10-CM | POA: Diagnosis not present

## 2018-12-25 DIAGNOSIS — E559 Vitamin D deficiency, unspecified: Secondary | ICD-10-CM | POA: Diagnosis not present

## 2018-12-28 DIAGNOSIS — E1140 Type 2 diabetes mellitus with diabetic neuropathy, unspecified: Secondary | ICD-10-CM | POA: Diagnosis not present

## 2018-12-28 DIAGNOSIS — Z7984 Long term (current) use of oral hypoglycemic drugs: Secondary | ICD-10-CM | POA: Diagnosis not present

## 2018-12-28 DIAGNOSIS — Z471 Aftercare following joint replacement surgery: Secondary | ICD-10-CM | POA: Diagnosis not present

## 2018-12-28 DIAGNOSIS — E11319 Type 2 diabetes mellitus with unspecified diabetic retinopathy without macular edema: Secondary | ICD-10-CM | POA: Diagnosis not present

## 2018-12-28 DIAGNOSIS — Z96652 Presence of left artificial knee joint: Secondary | ICD-10-CM | POA: Diagnosis not present

## 2018-12-28 DIAGNOSIS — E559 Vitamin D deficiency, unspecified: Secondary | ICD-10-CM | POA: Diagnosis not present

## 2018-12-28 DIAGNOSIS — E785 Hyperlipidemia, unspecified: Secondary | ICD-10-CM | POA: Diagnosis not present

## 2018-12-28 DIAGNOSIS — I10 Essential (primary) hypertension: Secondary | ICD-10-CM | POA: Diagnosis not present

## 2018-12-28 DIAGNOSIS — Z87891 Personal history of nicotine dependence: Secondary | ICD-10-CM | POA: Diagnosis not present

## 2018-12-28 DIAGNOSIS — D509 Iron deficiency anemia, unspecified: Secondary | ICD-10-CM | POA: Diagnosis not present

## 2018-12-28 DIAGNOSIS — I6529 Occlusion and stenosis of unspecified carotid artery: Secondary | ICD-10-CM | POA: Diagnosis not present

## 2018-12-28 DIAGNOSIS — I2510 Atherosclerotic heart disease of native coronary artery without angina pectoris: Secondary | ICD-10-CM | POA: Diagnosis not present

## 2018-12-30 ENCOUNTER — Encounter: Payer: Self-pay | Admitting: Internal Medicine

## 2018-12-30 DIAGNOSIS — I2510 Atherosclerotic heart disease of native coronary artery without angina pectoris: Secondary | ICD-10-CM | POA: Diagnosis not present

## 2018-12-30 DIAGNOSIS — E1140 Type 2 diabetes mellitus with diabetic neuropathy, unspecified: Secondary | ICD-10-CM | POA: Diagnosis not present

## 2018-12-30 DIAGNOSIS — E559 Vitamin D deficiency, unspecified: Secondary | ICD-10-CM | POA: Diagnosis not present

## 2018-12-30 DIAGNOSIS — Z96652 Presence of left artificial knee joint: Secondary | ICD-10-CM | POA: Diagnosis not present

## 2018-12-30 DIAGNOSIS — Z7984 Long term (current) use of oral hypoglycemic drugs: Secondary | ICD-10-CM | POA: Diagnosis not present

## 2018-12-30 DIAGNOSIS — E11319 Type 2 diabetes mellitus with unspecified diabetic retinopathy without macular edema: Secondary | ICD-10-CM | POA: Diagnosis not present

## 2018-12-30 DIAGNOSIS — I10 Essential (primary) hypertension: Secondary | ICD-10-CM | POA: Diagnosis not present

## 2018-12-30 DIAGNOSIS — I6529 Occlusion and stenosis of unspecified carotid artery: Secondary | ICD-10-CM | POA: Diagnosis not present

## 2018-12-30 DIAGNOSIS — Z471 Aftercare following joint replacement surgery: Secondary | ICD-10-CM | POA: Diagnosis not present

## 2018-12-30 DIAGNOSIS — Z87891 Personal history of nicotine dependence: Secondary | ICD-10-CM | POA: Diagnosis not present

## 2018-12-30 DIAGNOSIS — D509 Iron deficiency anemia, unspecified: Secondary | ICD-10-CM | POA: Diagnosis not present

## 2018-12-30 DIAGNOSIS — E785 Hyperlipidemia, unspecified: Secondary | ICD-10-CM | POA: Diagnosis not present

## 2018-12-31 ENCOUNTER — Other Ambulatory Visit: Payer: Self-pay | Admitting: Internal Medicine

## 2018-12-31 DIAGNOSIS — M25562 Pain in left knee: Secondary | ICD-10-CM | POA: Diagnosis not present

## 2018-12-31 NOTE — Progress Notes (Signed)
Hospital follow up  Assessment and Plan:  William Haney was seen today for hospitalization follow-up.  Diagnoses and all orders for this visit:  Primary osteoarthritis of left knee/S/P total knee arthroplasty, left Has followed up with Dr. Berenice Primas and recovering well  Reviewed pain management; can try plain tylenol, or mobic (cautioned not to take aleve/ibuprofen if taking mobic); reserve percocet for severe pain (reminded this contains tylenol and max daily dose of acetaminophen not to exceed 3500 mg/day) He is otherwise doing well and continuing with exercise and PT.   Type 2 diabetes mellitus with other specified complication, without long-term current use of insulin ([name]) Remains well controlled per close glucose monitoring at home Holding glimepiride unless elevated in evening due to episodes of hypoglycemia since addition of jardiance; he does prefer jardiance, improved control;  Reviewed hypoglycemic management; he can verbalize appropriate interventions today Follow up A1C at routine scheduled 3MOV  Essential hypertension Home value elevated today but typically has been well controlled in office and with home monitoring Continue current medications Monitor blood pressure at home; call if consistently over 130/80 Continue DASH diet.   Reminder to go to the ER if any CP, SOB, nausea, dizziness, severe HA, changes vision/speech, left arm numbness and tingling and jaw pain.  Anemia, unspecified type Acute on chronic following surgery; baseline iron not deficient; asymptomatic He is following high iron diet; reviewed today; encouraged to take with high vitamin C foods for improved absorption.  Recheck at next OV; recheck iron if not significantly improved He is to call if any blood in stool, black stools, dizziness, dyspnea or fatigue   All medications were reviewed with patient and family and fully reconciled. All questions answered fully, and patient and family members were encouraged to  call the office with any further questions or concerns. Discussed goal to avoid readmission related to this diagnosis.   Medications Discontinued During This Encounter  Medication Reason  . oxyCODONE (OXY IR/ROXICODONE) 5 MG immediate release tablet Patient has not taken in last 30 days    Over 40 minutes of exam, counseling, chart review, and complex, high/moderate level critical decision making was performed this visit.   Future Appointments  Date Time Provider [place] [facility]  02/18/2019  3:30 PM Unk Pinto, MD [facility] None  05/31/2019  9:00 AM [name], PA-C [facility] None  11/22/2019 10:00 AM [name], PA-C [facility] None    HPI BP (!) 142/87   Wt 131 lb (59.4 kg)   BMI 24.75 kg/m   68 y.o.male presents for follow up for transition from recent hospitalization or SNIF stay. Admit date to the hospital was 12/18/18, patient was discharged from the hospital on 12/19/18 and our clinical staff contacted the office the day after discharge to set up a follow up appointment. The discharge summary, medications, and diagnostic test results were reviewed before meeting with the patient. The patient was admitted for:   L knee arthritis; total knee arthroplasty  Hospital Course: William Haney is an 68 y.o. male who was admitted 12/18/2018 for operative treatment of Primary osteoarthritis of left knee. Patient has severe unremitting pain that affects sleep, daily activities, and work/hobbies. After pre-op clearance the patient was taken to the operating room on 12/18/2018 and underwent  Procedure(s): TOTAL KNEE ARTHROPLASTY.    Patient was given perioperative antibiotics:             Anti-infectives (From admission, onward)  [] Expand by [employer]     Dose/Rate Route Frequency Ordered Stop  12/18/18 2000  ceFAZolin (ANCEF) IVPB 2g/100 mL premix     2 g 200 mL/hr over 30 Minutes Intravenous Every 6 hours 12/18/18 1802 12/19/18 0334   12/18/18 1045  ceFAZolin  (ANCEF) IVPB 2g/100 mL premix     2 g 200 mL/hr over 30 Minutes Intravenous On call to O.R. 12/18/18 1035 12/18/18 1417         Patient was given sequential compression devices, early ambulation, and chemoprophylaxis to prevent DVT.  Per discharge note, patient benefited maximally from hospital stay and per discharge note there were no complications.     He does appear to have acute post-op anemia on post-op CBC superimposed on basline mild chronic anemia but did not undergo transfusion:   Component     Latest Ref Rng & Units 11/19/2018 12/15/2018 12/19/2018  WBC     4.0 - 10.5 K/uL 6.9 7.6 11.5 (H)  RBC     4.22 - 5.81 MIL/uL 6.16 (H) 6.25 (H) 5.18  Hemoglobin     13.0 - 17.0 g/dL 12.5 (L) 12.7 (L) 10.4 (L)  HCT     39.0 - 52.0 % 42.6 42.1 34.2 (L)  MCV     80.0 - 100.0 fL 69.2 (L) 67.4 (L) 66.0 (L)  MCH     26.0 - 34.0 pg 20.3 (L) 20.3 (L) 20.1 (L)  MCHC     30.0 - 36.0 g/dL 29.3 (L) 30.2 30.4  RDW     11.5 - 15.5 % 17.9 (H) 18.1 (H) 16.3 (H)  Platelets     150 - 400 K/uL 321 258 242   Lab Results  Component Value Date   IRON 87 05/27/2018   TIBC 360 05/27/2018   FERRITIN 38 02/26/2018   Today he reports he is doing well following surgery; denies any fatigue, dizziness with anemia. He is focusing on high iron foods, pushing greens daily.   He presented for post-op follow up with Dr. Berenice Primas yesterday, dressing was removed without concern for local infection and follow up xrays reportedly looked good.   Has been doing home PT but will be starting PT in person at Dr. Mayme Genta office starting on Monday next week;   He does report some pain of the knee, mainly superficial "tugging" pain at closer site per patient. He reports is taking percocet, takes 1 in the AM, then PRN, also tizanidine 2 mg BID. He has taken aleve occasionally. He has not tried plain tylenol. Has mobic but hasn't used.   He denies any constipation following surgery; does have colace 100 mg BID to  take if needed, but hasn't needed.    His blood pressure has been controlled at home, typically 130s/70s, today their BP is BP: (!) 142/87  He does workout (doing PT exercises and walking). He denies chest pain, shortness of breath, dizziness.   He has been working on diet and exercise for T2 diabetes on metformin 2000 mg and glimepiride 4 mg BID PRN, holding except if elevated in the evening due to some hypoglycemia in weeks prior to surgery, and jardiance 10 mg (expensive, current pending approval for financial assistance, given samples in the interim, but he feels working very well, glucose much improved with this agent, would like to continue), and denies increased appetite, nausea, paresthesia of the feet, polydipsia, polyuria, visual disturbances and vomiting. Reports fasting glucose has been stable in 120-125 range. Last A1C in the office was:  Lab Results  Component Value Date   HGBA1C 7.4 (H) 12/15/2018  Follow-up Information    Dorna Leitz, MD. Go on 12/31/2018.   Specialty: Orthopedic Surgery Why: Your appointment is scheduled fo 1045 Contact information: [place] [address] [phone]        Home, [facility] Follow up.   Specialty: [place] Why: you will be seen at home by [facility] for 5 visits prior to starting outpatient physical therapy  Contact information: [address] [place] [address] [phone]        [facility], [place]. Go on 12/31/2018.   Why: You are scheduled to start Outpatient physical therapy at 1200. Please go over to the therapy desk after your MD appointment to complete your paperwork  Contact information: Physical Therapy [place] [address] [phone]    Home health is involved for PT but will be completing soon.    Images while in the hospital: Dg Chest 2 View  Result Date: 12/15/2018 CLINICAL DATA:  01/29/2017 EXAM: CHEST - 2 VIEW COMPARISON:   Preop knee replacement FINDINGS: There is no focal consolidation. There is no pleural effusion or pneumothorax. The heart and mediastinal contours are unremarkable. There is evidence of prior CABG. There is no acute osseous abnormality. IMPRESSION: No active cardiopulmonary disease. Electronically Signed   By: Kathreen Devoid   On: 12/15/2018 16:49     Current Outpatient Medications (Endocrine & Metabolic):  .  empagliflozin (JARDIANCE) 10 MG TABS tablet, Take 10 mg by mouth daily. [name]  glimepiride (AMARYL) 4 MG tablet, Take 1 tablet 2 x  /day with Meals for DiabetesT .  metFORMIN (GLUCOPHAGE) 500 MG tablet, TAKE 2 TABLETS BY MOUTH EVERY MORNING AND 2 EVERY EVENING (Patient taking differently: Take 1,000 mg by mouth 2 (two) times daily with a meal. )  Current Outpatient Medications (Cardiovascular):  .  metoprolol tartrate (LOPRESSOR) 25 MG tablet, Take 1 tablet 2 x /day for BP (Patient taking differently: Take 25 mg by mouth 2 (two) times daily. ) .  rosuvastatin (CRESTOR) 20 MG tablet, Take 1 tablet Daily for Cholesterol (Patient taking differently: Take 20 mg by mouth at bedtime. ) .  telmisartan (MICARDIS) 20 MG tablet, TAKE 1 TABLET DAILY FOR BP & KIDNEY PROTECTION (Patient taking differently: Take 20 mg by mouth daily. )   Current Outpatient Medications (Analgesics):  .  aspirin EC 325 MG tablet, Take 1 tablet (325 mg total) by mouth 2 (two) times daily after a meal. Take x 1 month post op to decrease risk of blood clots. [name]  oxyCODONE-acetaminophen (PERCOCET/ROXICET) 5-325 MG tablet, Take 1-2 tablets by mouth every 6 (six) hours as needed for severe pain. [name]  diclofenac (VOLTAREN) 75 MG EC tablet, Take 75 mg by mouth 2 (two) times daily. .  meloxicam (MOBIC) 15 MG tablet, TAKE 1 TABLET BY MOUTH EVERY DAY WITH A MEAL   Current Outpatient Medications (Other):  .  blood glucose meter kit and supplies, Test sugars once to twice daily .  Cholecalciferol (VITAMIN D3) 125 MCG (5000 UT) CAPS, Take  5,000 Units by mouth 4 (four) times a week.  .  Cinnamon 500 MG capsule, Take 2 capsules (1,000 mg total) by mouth 2 (two) times daily. (Patient taking differently: Take 500 mg by mouth daily. ) .  docusate sodium (COLACE) 100 MG capsule, Take 1 capsule (100 mg total) by mouth 2 (two) times daily. .  Multiple Vitamin (MULTIVITAMIN) tablet, Take 1 tablet by mouth daily. [name]  tiZANidine (ZANAFLEX) 2 MG tablet, Take 1  tablet (2 mg total) by mouth every 8 (eight) hours as needed for muscle spasms.  Past Medical History:  Diagnosis Date  . Anemia   . Carotid artery stenosis 01/2017   L-ICA 1-39%, R-ICA 40-59% doppler  . Coronary artery disease    s/p CABG in 2008, w/ LIMA-LAD, SVG-Diag, SVG-OM, SVG-PL  . Diabetes mellitus type II   . Hyperlipidemia   . Hypertension   . Vitamin D deficiency      Allergies  Allergen Reactions  . Ace Inhibitors Cough  . Statins Other (See Comments)    Elevated LFT's    ROS: all negative except above.   Physical Exam: Filed Weights   01/01/19 0952  Weight: 131 lb (59.4 kg)   BP (!) 142/87   Wt 131 lb (59.4 kg)   BMI 24.75 kg/m   General : Well sounding patient in no apparent distress HEENT: no hoarseness, no cough for duration of visit Lungs: speaks in complete sentences, no audible wheezing, no apparent distress Neurological: alert, oriented x 3 Psychiatric: pleasant, judgement appropriate     Izora Ribas, NP 10:18 AM [facility]

## 2019-01-01 ENCOUNTER — Encounter: Payer: Self-pay | Admitting: Adult Health

## 2019-01-01 ENCOUNTER — Other Ambulatory Visit: Payer: Self-pay

## 2019-01-01 ENCOUNTER — Ambulatory Visit: Payer: Medicare Other | Admitting: Adult Health

## 2019-01-01 VITALS — BP 142/87 | Wt 131.0 lb

## 2019-01-01 DIAGNOSIS — Z96652 Presence of left artificial knee joint: Secondary | ICD-10-CM | POA: Diagnosis not present

## 2019-01-01 DIAGNOSIS — I10 Essential (primary) hypertension: Secondary | ICD-10-CM | POA: Diagnosis not present

## 2019-01-01 DIAGNOSIS — M1712 Unilateral primary osteoarthritis, left knee: Secondary | ICD-10-CM | POA: Diagnosis not present

## 2019-01-01 DIAGNOSIS — E1169 Type 2 diabetes mellitus with other specified complication: Secondary | ICD-10-CM | POA: Diagnosis not present

## 2019-01-01 DIAGNOSIS — D649 Anemia, unspecified: Secondary | ICD-10-CM | POA: Diagnosis not present

## 2019-01-04 DIAGNOSIS — M25662 Stiffness of left knee, not elsewhere classified: Secondary | ICD-10-CM | POA: Diagnosis not present

## 2019-01-04 DIAGNOSIS — R262 Difficulty in walking, not elsewhere classified: Secondary | ICD-10-CM | POA: Diagnosis not present

## 2019-01-04 DIAGNOSIS — Z96652 Presence of left artificial knee joint: Secondary | ICD-10-CM | POA: Diagnosis not present

## 2019-01-06 DIAGNOSIS — M25662 Stiffness of left knee, not elsewhere classified: Secondary | ICD-10-CM | POA: Diagnosis not present

## 2019-01-06 DIAGNOSIS — R262 Difficulty in walking, not elsewhere classified: Secondary | ICD-10-CM | POA: Diagnosis not present

## 2019-01-06 DIAGNOSIS — Z96652 Presence of left artificial knee joint: Secondary | ICD-10-CM | POA: Diagnosis not present

## 2019-01-14 DIAGNOSIS — M25662 Stiffness of left knee, not elsewhere classified: Secondary | ICD-10-CM | POA: Diagnosis not present

## 2019-01-14 DIAGNOSIS — M25562 Pain in left knee: Secondary | ICD-10-CM | POA: Diagnosis not present

## 2019-01-14 DIAGNOSIS — Z96652 Presence of left artificial knee joint: Secondary | ICD-10-CM | POA: Diagnosis not present

## 2019-01-14 DIAGNOSIS — R262 Difficulty in walking, not elsewhere classified: Secondary | ICD-10-CM | POA: Diagnosis not present

## 2019-01-20 DIAGNOSIS — M25662 Stiffness of left knee, not elsewhere classified: Secondary | ICD-10-CM | POA: Diagnosis not present

## 2019-01-20 DIAGNOSIS — R262 Difficulty in walking, not elsewhere classified: Secondary | ICD-10-CM | POA: Diagnosis not present

## 2019-01-20 DIAGNOSIS — Z96652 Presence of left artificial knee joint: Secondary | ICD-10-CM | POA: Diagnosis not present

## 2019-01-27 DIAGNOSIS — Z96652 Presence of left artificial knee joint: Secondary | ICD-10-CM | POA: Diagnosis not present

## 2019-01-27 DIAGNOSIS — M25662 Stiffness of left knee, not elsewhere classified: Secondary | ICD-10-CM | POA: Diagnosis not present

## 2019-01-27 DIAGNOSIS — R262 Difficulty in walking, not elsewhere classified: Secondary | ICD-10-CM | POA: Diagnosis not present

## 2019-02-17 ENCOUNTER — Encounter: Payer: Self-pay | Admitting: Internal Medicine

## 2019-02-17 NOTE — Progress Notes (Addendum)
     History of Present Illness:      This very nice 68 y.o. married Man  presents for 3 month follow up with HTN, HLD, T2_NIDDM and Vitamin D Deficiency. Patient also is know to have Aortoiliac Atherosclerosis.       Patient is treated for HTN & BP has been controlled at home. Today's BP is at goal -  114/60.  Patient had a CABG in 2008 and is followed by Dr Hochrein. Patient has had no complaints of any cardiac type chest pain, palpitations, dyspnea / orthopnea / PND, dizziness, claudication, or dependent edema.      Hyperlipidemia is controlled with diet & Rosuvastatin. Patient denies myalgias or other med SE's. Last Lipids were at goal:  Lab Results  Component Value Date   CHOL 98 11/19/2018   HDL 43 11/19/2018   LDLCALC 40 11/19/2018   TRIG 71 11/19/2018   CHOLHDL 2.3 11/19/2018        Also, the patient has history of T2_NIDDM  (2000) and has had no symptoms of reactive hypoglycemia, diabetic polys, paresthesias or visual blurring.  Last A1c was not at goal:  Lab Results  Component Value Date   HGBA1C 7.4 (H) 12/15/2018        Further, the patient also has history of Vitamin D Deficiency ("37" / 2017)  and supplements vitamin D without any suspected side-effects. Last vitamin D was near goal (70-100):  Lab Results  Component Value Date   VD25OH 54 02/18/2018    Current Outpatient Medications on File Prior to Visit  Medication Sig  . aspirin EC 325 MG tablet Take 1 tablet (325 mg total) by mouth 2 (two) times daily after a meal. Take x 1 month post op to decrease risk of blood clots.  . blood glucose meter kit and supplies Test sugars once to twice daily  . Cholecalciferol (VITAMIN D3) 125 MCG (5000 UT) CAPS Take 5,000 Units by mouth 4 (four) times a week.   . Cinnamon 500 MG capsule Take 2 capsules (1,000 mg total) by mouth 2 (two) times daily. (Patient taking differently: Take 500 mg by mouth daily. )  . empagliflozin (JARDIANCE) 10 MG TABS tablet Take 10 mg by mouth  daily.  . glimepiride (AMARYL) 4 MG tablet Take 1 tablet 2 x  /day with Meals for DiabetesT  . metFORMIN (GLUCOPHAGE) 500 MG tablet TAKE 2 TABLETS BY MOUTH EVERY MORNING AND 2 EVERY EVENING (Patient taking differently: Take 1,000 mg by mouth 2 (two) times daily with a meal. )  . metoprolol tartrate (LOPRESSOR) 25 MG tablet Take 1 tablet 2 x /day for BP (Patient taking differently: Take 25 mg by mouth 2 (two) times daily. )  . Multiple Vitamin (MULTIVITAMIN) tablet Take 1 tablet by mouth daily.  . oxyCODONE-acetaminophen (PERCOCET/ROXICET) 5-325 MG tablet Take 1-2 tablets by mouth every 6 (six) hours as needed for severe pain.  . rosuvastatin (CRESTOR) 20 MG tablet Take 1 tablet Daily for Cholesterol (Patient taking differently: Take 20 mg by mouth at bedtime. )  . telmisartan (MICARDIS) 20 MG tablet TAKE 1 TABLET DAILY FOR BP & KIDNEY PROTECTION (Patient taking differently: Take 20 mg by mouth daily. )   No current facility-administered medications on file prior to visit.    Allergies  Allergen Reactions  . Ace Inhibitors Cough  . Statins Other (See Comments)    Elevated LFT's    PMHx:   Past Medical History:  Diagnosis Date  . Anemia   .   Carotid artery stenosis 01/2017   L-ICA 1-39%, R-ICA 40-59% doppler  . Coronary artery disease    s/p CABG in 2008, w/ LIMA-LAD, SVG-Diag, SVG-OM, SVG-PL  . Diabetes mellitus type II   . Hyperlipidemia   . Hypertension   . Vitamin D deficiency    Immunization History  Administered Date(s) Administered  . DT 02/07/2015  . Influenza Whole 12/10/2012  . Influenza, High Dose Seasonal PF 02/07/2015, 01/02/2016, 12/13/2016, 01/01/2018, 11/19/2018  . Influenza-Unspecified 12/31/2013  . Meningococcal Conjugate 09/03/2016  . Pneumococcal Conjugate-13 02/02/2014  . Pneumococcal Polysaccharide-23 04/13/2003, 10/08/2016  . Td 04/18/2004   Past Surgical History:  Procedure Laterality Date  . CORONARY ARTERY BYPASS GRAFT  2008   LIMA to the LAD,  saphenous vein graft to diagonal, saphenous vein graft to OM, saphenous vein graft to posterolateral 2008  . TOTAL KNEE ARTHROPLASTY Left 12/18/2018   Procedure: TOTAL KNEE ARTHROPLASTY;  Surgeon: Dorna Leitz, MD;  Location: [facility];  Service: Orthopedics;  Laterality: Left;    FHx:    Reviewed / unchanged  SHx:    Reviewed / unchanged   Systems Review:  Constitutional: Denies fever, chills, wt changes, headaches, insomnia, fatigue, night sweats, change in appetite. Eyes: Denies redness, blurred vision, diplopia, discharge, itchy, watery eyes.  ENT: Denies discharge, congestion, post nasal drip, epistaxis, sore throat, earache, hearing loss, dental pain, tinnitus, vertigo, sinus pain, snoring.  CV: Denies chest pain, palpitations, irregular heartbeat, syncope, dyspnea, diaphoresis, orthopnea, PND, claudication or edema. Respiratory: denies cough, dyspnea, DOE, pleurisy, hoarseness, laryngitis, wheezing.  Gastrointestinal: Denies dysphagia, odynophagia, heartburn, reflux, water brash, abdominal pain or cramps, nausea, vomiting, bloating, diarrhea, constipation, hematemesis, melena, hematochezia  or hemorrhoids. Genitourinary: Denies dysuria, frequency, urgency, nocturia, hesitancy, discharge, hematuria or flank pain. Musculoskeletal: Denies arthralgias, myalgias, stiffness, jt. swelling, pain, limping or strain/sprain.  Skin: Denies pruritus, rash, hives, warts, acne, eczema or change in skin lesion(s). Neuro: No weakness, tremor, incoordination, spasms, paresthesia or pain. Psychiatric: Denies confusion, memory loss or sensory loss. Endo: Denies change in weight, skin or hair change.  Heme/Lymph: No excessive bleeding, bruising or enlarged lymph nodes.  Physical Exam  BP 114/60   Pulse 76   Temp 97.6 F (36.4 C)   Resp 16   Ht 5' 1" (1.549 m)   Wt 129 lb 6.4 oz (58.7 kg)   BMI 24.45 kg/m   Appears  well nourished, well groomed  and in no distress.  Eyes: PERRLA, EOMs,  conjunctiva no swelling or erythema. Sinuses: No frontal/maxillary tenderness ENT/Mouth: EAC's clear, TM's nl w/o erythema, bulging. Nares clear w/o erythema, swelling, exudates. Oropharynx clear without erythema or exudates. Oral hygiene is good. Tongue normal, non obstructing. Hearing intact.  Neck: Supple. Thyroid not palpable. Car 2+/2+ without bruits, nodes or JVD. Chest: Respirations nl with BS clear & equal w/o rales, rhonchi, wheezing or stridor.  Cor: Heart sounds normal w/ regular rate and rhythm without sig. murmurs, gallops, clicks or rubs. Peripheral pulses normal and equal  without edema.  Abdomen: Soft & bowel sounds normal. Non-tender w/o guarding, rebound, hernias, masses or organomegaly.  Lymphatics: Unremarkable.  Musculoskeletal: Full ROM all peripheral extremities, joint stability, 5/5 strength and normal gait.  Skin: Warm, dry without exposed rashes, lesions or ecchymosis apparent.  Neuro: Cranial nerves intact, reflexes equal bilaterally. Sensory-motor testing grossly intact. Tendon reflexes grossly intact.  Pysch: Alert & oriented x 3.  Insight and judgement nl & appropriate. No ideations.  Assessment and Plan:  1. Essential hypertension  - Continue medication, monitor blood  pressure at home.  - Continue DASH diet.  Reminder to go to the ER if any CP,  SOB, nausea, dizziness, severe HA, changes vision/speech.  - CBC with Diff - COMPLETE METABOLIC PANEL WITH GFR - Magnesium - TSH  2. Hyperlipidemia associated with type 2 diabetes mellitus ([place])  - Continue diet/meds, exercise,& lifestyle modifications.  - Continue monitor periodic cholesterol/liver & renal functions   - Lipid Profile - TSH  3. Type 2 diabetes mellitus  ([facility])  - Continue diet, exercise  - Lifestyle modifications.  - Monitor appropriate labs.  - Hemoglobin A1c ([facility])  4. Vitamin D deficiency  - Continue supplementation.  - Vitamin D (25 hydroxy)  5. Poorly controlled type 2  diabetes mellitus  ([place])   6. Atherosclerosis of native coronary artery of native heart without angina pectoris  - Lipid Profile  7. Aortoiliac Atherosclerosis   8. Medication management  - CBC with Diff - COMPLETE METABOLIC PANEL WITH GFR - Magnesium - Lipid Profile - TSH - Hemoglobin A1c ([facility]) - Vitamin D (25 hydroxy)        Discussed  regular exercise, BP monitoring, weight control to achieve/maintain BMI less than 25 and discussed med and SE's. Recommended labs to assess and monitor clinical status with further disposition pending results of labs.  I discussed the assessment and treatment plan with the patient. The patient was provided an opportunity to ask questions and all were answered. The patient agreed with the plan and demonstrated an understanding of the instructions.  I provided over 30 minutes of exam, counseling, chart review and  complex critical decision making.  Kirtland Bouchard, MD

## 2019-02-17 NOTE — Patient Instructions (Signed)

## 2019-02-18 ENCOUNTER — Ambulatory Visit (INDEPENDENT_AMBULATORY_CARE_PROVIDER_SITE_OTHER): Payer: Medicare Other | Admitting: Internal Medicine

## 2019-02-18 ENCOUNTER — Other Ambulatory Visit: Payer: Self-pay

## 2019-02-18 VITALS — BP 114/60 | HR 76 | Temp 97.6°F | Resp 16 | Ht 61.0 in | Wt 129.4 lb

## 2019-02-18 DIAGNOSIS — I708 Atherosclerosis of other arteries: Secondary | ICD-10-CM

## 2019-02-18 DIAGNOSIS — E1169 Type 2 diabetes mellitus with other specified complication: Secondary | ICD-10-CM | POA: Diagnosis not present

## 2019-02-18 DIAGNOSIS — Z79899 Other long term (current) drug therapy: Secondary | ICD-10-CM

## 2019-02-18 DIAGNOSIS — I2510 Atherosclerotic heart disease of native coronary artery without angina pectoris: Secondary | ICD-10-CM

## 2019-02-18 DIAGNOSIS — E559 Vitamin D deficiency, unspecified: Secondary | ICD-10-CM

## 2019-02-18 DIAGNOSIS — I700 Atherosclerosis of aorta: Secondary | ICD-10-CM

## 2019-02-18 DIAGNOSIS — N182 Chronic kidney disease, stage 2 (mild): Secondary | ICD-10-CM

## 2019-02-18 DIAGNOSIS — E785 Hyperlipidemia, unspecified: Secondary | ICD-10-CM

## 2019-02-18 DIAGNOSIS — E1129 Type 2 diabetes mellitus with other diabetic kidney complication: Secondary | ICD-10-CM | POA: Diagnosis not present

## 2019-02-18 DIAGNOSIS — E1122 Type 2 diabetes mellitus with diabetic chronic kidney disease: Secondary | ICD-10-CM | POA: Diagnosis not present

## 2019-02-18 DIAGNOSIS — I10 Essential (primary) hypertension: Secondary | ICD-10-CM | POA: Diagnosis not present

## 2019-02-18 DIAGNOSIS — E1165 Type 2 diabetes mellitus with hyperglycemia: Secondary | ICD-10-CM

## 2019-02-19 LAB — COMPLETE METABOLIC PANEL WITH GFR
AG Ratio: 1.6 (calc) (ref 1.0–2.5)
ALT: 14 U/L (ref 9–46)
AST: 14 U/L (ref 10–35)
Albumin: 4.1 g/dL (ref 3.6–5.1)
Alkaline phosphatase (APISO): 68 U/L (ref 35–144)
BUN: 20 mg/dL (ref 7–25)
CO2: 27 mmol/L (ref 20–32)
Calcium: 9.6 mg/dL (ref 8.6–10.3)
Chloride: 99 mmol/L (ref 98–110)
Creat: 1.09 mg/dL (ref 0.70–1.18)
GFR, Est African American: 79 mL/min/{1.73_m2} (ref >=60)
GFR, Est Non African American: 68 mL/min/{1.73_m2} (ref >=60)
Globulin: 2.6 g/dL (calc) (ref 1.9–3.7)
Glucose, Bld: 298 mg/dL — ABNORMAL HIGH (ref 65–99)
Potassium: 4.9 mmol/L (ref 3.5–5.3)
Sodium: 134 mmol/L — ABNORMAL LOW (ref 135–146)
Total Bilirubin: 0.4 mg/dL (ref 0.2–1.2)
Total Protein: 6.7 g/dL (ref 6.1–8.1)

## 2019-02-19 LAB — CBC WITH DIFFERENTIAL/PLATELET
Absolute Monocytes: 842 cells/uL (ref 200–950)
Basophils Absolute: 97 cells/uL (ref 0–200)
Basophils Relative: 1.2 %
Eosinophils Absolute: 446 cells/uL (ref 15–500)
Eosinophils Relative: 5.5 %
HCT: 41.8 % (ref 38.5–50.0)
Hemoglobin: 12.1 g/dL — ABNORMAL LOW (ref 13.2–17.1)
Lymphs Abs: 1442 cells/uL (ref 850–3900)
MCH: 20.6 pg — ABNORMAL LOW (ref 27.0–33.0)
MCHC: 28.9 g/dL — ABNORMAL LOW (ref 32.0–36.0)
MCV: 71.1 fL — ABNORMAL LOW (ref 80.0–100.0)
Monocytes Relative: 10.4 %
Neutro Abs: 5273 cells/uL (ref 1500–7800)
Neutrophils Relative %: 65.1 %
Platelets: 328 10*3/uL (ref 140–400)
RBC: 5.88 10*6/uL — ABNORMAL HIGH (ref 4.20–5.80)
RDW: 16.1 % — ABNORMAL HIGH (ref 11.0–15.0)
Total Lymphocyte: 17.8 %
WBC: 8.1 10*3/uL (ref 3.8–10.8)

## 2019-02-19 LAB — LIPID PANEL
Cholesterol: 106 mg/dL (ref <200)
HDL: 34 mg/dL — ABNORMAL LOW (ref >=40)
LDL Cholesterol (Calc): 50 mg/dL (calc)
Non-HDL Cholesterol (Calc): 72 mg/dL (calc) (ref <130)
Total CHOL/HDL Ratio: 3.1 (calc) (ref <5.0)
Triglycerides: 132 mg/dL (ref <150)

## 2019-02-19 LAB — HEMOGLOBIN A1C
Hgb A1c MFr Bld: 8.1 % of total Hgb — ABNORMAL HIGH (ref <5.7)
Mean Plasma Glucose: 186 (calc)
eAG (mmol/L): 10.3 (calc)

## 2019-02-19 LAB — MAGNESIUM: Magnesium: 2.1 mg/dL (ref 1.5–2.5)

## 2019-02-19 LAB — TSH: TSH: 1.11 mIU/L (ref 0.40–4.50)

## 2019-02-19 LAB — VITAMIN D 25 HYDROXY (VIT D DEFICIENCY, FRACTURES): Vit D, 25-Hydroxy: 29 ng/mL — ABNORMAL LOW (ref 30–100)

## 2019-02-25 ENCOUNTER — Other Ambulatory Visit: Payer: Self-pay | Admitting: Internal Medicine

## 2019-02-26 ENCOUNTER — Other Ambulatory Visit: Payer: Self-pay | Admitting: Adult Health

## 2019-02-26 DIAGNOSIS — T464X5A Adverse effect of angiotensin-converting-enzyme inhibitors, initial encounter: Secondary | ICD-10-CM

## 2019-02-26 DIAGNOSIS — I10 Essential (primary) hypertension: Secondary | ICD-10-CM

## 2019-02-26 DIAGNOSIS — R05 Cough: Secondary | ICD-10-CM

## 2019-04-14 ENCOUNTER — Encounter: Payer: Self-pay | Admitting: *Deleted

## 2019-04-14 DIAGNOSIS — Z006 Encounter for examination for normal comparison and control in clinical research program: Secondary | ICD-10-CM

## 2019-04-14 NOTE — Research (Signed)
COORDINATE-Diabetes 6 Month CASE REPORT FORM (Intervention) -EHR Site #:   [id number]              Patient ID:          [id number]     7 [id number] EHR REVIEW  Medical Record Check Date 30/DEC/2020   Vital Status [x] Patient Alive >Date last known alive per EHR: 10/DEC/2020 [] Patient Dead >> Complete Death Form  [] Unknown   CLINICAL EVENTS / PROCEDURES  Hospitalization since last visit? (>=24 hour stay) [] No [x] Yes  >> if yes, Complete the following  Date of hospital admission:   09/OCT/2020       MM DD YYYY  Primary discharge diagnosis:  *Complete appropriate event validation form [] acute myocardial infarction (heart attack)* [] stroke* [] heart failure* [] coronary revascularization* [] peripheral revascularization* [] cerebral revascularization* [] diabetes (e.g. hypoglycemia, DKA) [] renal failure [] amputation [] other cardiovascular reason [x] other NON-cardiovascular reason [] unknown  Other diagnoses not documented above: (check all that apply)  *Complete appropriate event validation form [] acute myocardial infarction (heart attack)* [] stroke* [] heart failure* [] coronary revascularization* [] peripheral revascularization* [] cerebral revascularization* [] diabetes (e.g. hypoglycemia, DKA) [] renal failure [] amputation [] other cardiovascular reason [x] other NON-cardiovascular reason [] unknown  Were any of the following outpatient procedures done since the last visit? (I.e. procedures not captured above)  Primary osteoarthritis of left knee  Coronary revascularization [x] No [] Yes >IF YES, Date / /             MM DD YYYY  Peripheral revascularization [x] No [] Yes > IF YES, Date / /             MM DD YYYY  Cerebral revascularization [x] No [] Yes > IF YES, Date / /             MM DD YYYY  Extremity amputation    [x] No [] Yes >IF YES, Date / /             MM      DD       YYYY  Renal replacement therapy (i.e. dialysis)    [x] No [] Yes > IF YES, Date of initiation / /             MM      DD        YYYY   **EDC will allow for collection of multiple hospitalizations and procedures   MEDICATIONS  Medication Currently Prescribed? If started since last visit: If not started since last visit: If stopped since last visit:  Cardiac Medications  ACE Inhibitor / Angiotensin Receptor Blocker (ARB) / Angiotensin Receptor Neprilysin inhibitor (ARNi)  [] No >  [x] Yes > Since last visit, medication was: [] Stopped [] Not started [] Started [x] Continued same medication [] Continued with medication changes Date started:        / /             MM DD YYYY Who prescribed? [] Cardiology provider  [] Study clinic [] Outside clinic [] Endocrinology provider [] Primary care provider [] Other provider  Specify:                             [] Unknown Reason (check all that apply): [] History of swelling around lips, eyes or face [] Feeling dizzy/lightheaded [] Low blood pressure [] Poor or fluctuating kidney function [] High potassium [] Patient has experienced other side effects to this medication  before [] Patient will be unable to adhere/monitor [] Patient unable to afford it [] Patient does not want to      take this medication [] Pregnancy [] Other (specify: ) [] Unknown Reason Date discontinued:        / /  MM DD YYYY Reason (check all that apply): [] Swelling around lips, eyes       or face [] Feeling dizzy/lightheaded [] Low blood pressure [] Poor or fluctuating kidney function [] High potassium [] Other medication side       effects [] Patient unable to        adhere/monitor [] Had an       operation/procedure that        required stopping it [] Patient unable to afford it [] Patient no longer wants       to take this medication [] Pregnancy [] Other (specify: ) [] Unknown Reason   If started or changed  Medication Name: [] Benazepril (Lotensin) [] Captopril (Capoten) [] Enalapril (Vasotec) [] Fosinopril (Monopril) [] Lisinopril (Zestril, Prinivil) [] Quinapril  (Accupril) [] Ramipril (Altace) [] Azilsartan (Edarbi) [] Candesartan (Atacand) [] Irbesartan (Avapro) [] Losartan (Cozaar) [] Olmesartan (Benicar) [] Telmisartan (Micardis) [] Valsartan (Diovan) [] Sacrubitril/Valsartan (Entresto)     Beta Blocker [] No [x]  Yes > [] Acebutolol (Sectral) [] Bisoprolol (Zebeta) [] Carvedilol (Coreg) [] Labetalol (Trandate,      Normodyne) [] Metoprolol succinate (Toprol) [x] Metoprolol tartrate       (Lopressor) [] Nadolol (Corgard) [] Nebivolol (Bystolic) [] Propranolol (Inderal) [] Sotalol (Betapace)     Medication Currently Prescribed? If started since last visit: If not started since last visit: If stopped since last visit:  Aldosterone Antagonist [x] No [] Yes > [] Amiloride [] Eplerenone (Inspra) [] Spirinolactone (Aldactone) [] Traimterene (Dyrenium)   Calcium Channel Blocker [x] No []  Yes > Medication Name: [] Amlodipine (Norvasc) [] Diltiazem (Cardizem) [] Felodipine (Plendil) [] Nifedipine (Procardia) [] Verapamil (Calan)   Diuretic Loop [x] No []  Yes > Medication Name: [] Bumetanide (Bumex) [] Ethacrynic acid (Edecrin) [] Furosemide (Lasix) [] Torsemide (Demadex)   Diuretic Thiazide- type [x] No []  Yes > Medication Name: [] Chlorothiazide      [] Chlorthalidone [] Hydrochlorothiazide [] Indapamide [] Metolazone   Anticoagulation Therapy (other than Warfarin) [x] No []  Yes > Medication Name: [] Apixaban (Eliquis) [] Edoxaban (Lixiana) [] Rivaroxaban Alen Blew) [] Dabigatran (Redaxa)   Warfarin [x] No []  Yes    Antiplatelet Agent (including aspirin) [] No [x]  Yes > Medication Name (check all that apply): [x] Aspirin [] Clopidogrel (Plavix) [] Prasugrel (Effient) [] Ticagrelor (Brilinta) [] Ticlopidine (Ticlid) [] Dipyridamole (Persantine)    Medication Currently Prescribed? If started since last visit: If not started since last visit: If stopped since last visit:  Statin  [] No >  [x] Yes > Since last visit, medication  was: [] Stopped [] Not started [] Started [x] Continued same medication and dose [] Continued with dose or medication changes Date started:        / /             MM DD YYYY Who prescribed? [] Cardiology provider [] Endocrinology provider [] Primary care provider [] Other provider  Specify:                             [] Unknown Reason (check all that apply): [] History of Rhabdomyolysis [] LDL-cholesterol already       <70 [] Muscle      aches/pain/weakness [] Mental       fogginess/memory loss [] Liver dysfunction [] Patient has  experienced other side   effects to this medication before [] Patient will be unable to adhere/monitor [] Patient unable to afford it [] Patient does not want to take this medication [] Pregnancy [] Other (specify: ) [] Unknown Reason Date discontinued:        / /             MM DD YYYY Reason (check all that apply): [] Rhabdomyolysis [] Muscle aches/pain/weakness [] Mental fogginess/memory loss [] Liver dysfunction [] Other medication side effects [] Patient unable to          adhere/monitor [] Patient unable to afford it [] Patient no longer wants to take  this medication [] Pregnancy [] Other (specify: ) [] Unknown Reason   If started or changed  Medication Name: [] Atorvastatin (Lipitor) [] Fluvastatin (Lescol) [] Lovastatin (Mevacor) [] Pravastatin (Pravachol) [x] Rosuvastatin (Crestor) [] Simvastatin (Zocor) [] Pitatavastatin (Livalo)  Dose: [] 1 mg [] 10 mg [] 2 mg [x]  20 mg [] 3 mg []  40 mg [] 4 mg []  60 mg [] 5 mg []  80 mg  Frequency: [] Daily  [] Less than daily      Does the patient have statin intolerance that prevents the use of maximum dose of high potency statin? [x] No [] Yes >IF YES, Complete Statin Intolerance form   Non-statin lipid lowering therapy [x] No [] Yes > Medication Name (check all that apply): [] Colesevelam (Welchol) [] Ezetimibe (Zetia) [] Fibrate [] Niacin [] PCSK9 inhibitor [] Omega 3 acid ethyl esters (Lovaza) [] Icosapent  Ethyl (Vascepa) [] Over the counter omega 3 fatty acid or fish oil supplement     Medication Currently Prescribed? If started since last visit: If not started since last visit: If stopped since last visit:  Diabetes Medications  SGLT2 Inhibitor  [] No >  [x] Yes > Since last visit, medication was: [] Stopped [] Not started [] Started [x] Continued same medication [] Continued with medication changes Date started:        / /             MM DD YYYY Who prescribed? [] Cardiology provider  [] Study clinic [] Outside clinic [] Endocrinology      provider [] Primary care provider [] Other provider  Specify:                             [] Unknown Reason (check all that apply): [] eGFR <45 [] HbA1c<7% on metformin monotherapy OR already on GLP1RA and do not need to start another anti- hyperglycemic [] Already dehydrated [] Low blood pressure [] High risk of Hypoglycemia [] Prior DKA [] Recurrent mycotic genital infections [] History of or at risk for amputation [] Patient has experienced other side effects to this medication before [] Patient will be unable to adhere/monitor [] Patient unable to afford it [] Patient does not want to take this medication [] Pregnancy [] Other (specify: ) [] Unknown Reason Date discontinued:        / /             MM DD YYYY Reason (check all that apply  [] eGFR now <45 [] Dehydration [] Low blood pressure [] Hypoglycemia [] DKA [] Mycotic genital infection [] Amputation [] Other medication side effects [] Patient unable    to adhere/monitor  [] Had an operation/procedure     that required stopping it [] Patient unable to afford it [] Patient no longer wants to      take this medication [] Pregnancy [] Other (specify: ) [] Unknown Reason   If started or changed  Medication Name: [] Canaglifozin (Invokana) [] Dapagliflozin Wilder Glade) [x] Empaglifozin (Jardiance) [] Ertugliflozin Actuary)           Medication Currently Prescribed? If started since last visit:  If not started since last visit: If stopped since last visit:  GLP1 Receptor Agonist  [x] No >  [] Yes > Since last visit, medication was: [] Stopped [x] Not started [] Started [] Continued same medication [] Continued with medication changes Date started:        / /             MM DD YYYY Who prescribed? [] Cardiology provider  [] Study clinic [] Outside clinic [] Endocrinology       provider [] Primary care provider [] Other provider > Specify:                             [] Unknown Reason (check all that apply): [] Personal or family  history of medullary thyroid cancer [] MEN2 [x] HbA1c<7% on metformin monotherapy OR already on SGLT2i and do not need to start another anti-hyperglycemic [] eGFR now <30 [] High risk of Hypoglycemia [] History of pancreatitis [] Significant gastroparesis [] Prior gastric surgery [] Patient has experienced other side effects to this medication before [] Patient will be unable to adhere/monitor [] Patient unable to afford it [] Patient does not want to take this medication [] Pregnancy [] Other (specify: ) [] Unknown Reason Date discontinued:        / /             MM DD YYYY Reason (check all that apply): [] Medullary thyroid cancer [] MEN2 [] eGFR now <30 [] Hypoglycemia [] Pancreatitis [] Significant gastroparesis [] Gastric surgery [] Other medication side       effects []   Patient  unable    to adhere/monitor                                  [] Had an operation/procedure that required stopping it [] Patient unable to afford it [] Patient no longer wants to take this medication [] Pregnancy [] Other (specify: ) [] Unknown Reason   If started or changed > Medication Name: [] Albiglutide (Tanzeum) [] Dulaglutide (Trulicity) [] Exanatide (Byetta, Bydureon) [] Liraglutide (Victoza, Saxenda) [] Lixisenatide (Adlyxin) [] Semaglutice (Ozempic)      Medication Currently Prescribed? If started since last visit: If not started since last visit: If stopped since  last visit:  Other non Insulin diabetes medications [] No [] Yes > Medication Name (check all that apply): [] Acarbose (Precose) [] Miglitol (Glyset) [] Glimepiride (Amaryl) [x] Glipizide (Amaryl) [] Glyburide (Diabeta,       Glynase,   Micronase) [x] Metformin (Fortamet,        Glucophage[including XR],        Glumetza, Riomet) [] Pioglitazone (Actos) [] Nateglinide (Starlix) [] Pramlintide (Symilin) [] Repaglinide (Prandin) [] Rosiglitazone (Avandia) [] Alogliptin (Nesina) [] Linagliptin (Tradjenta) [] Saxagliptin (Onglyza) [] Sitagliptin (Januvia) [] Bromocriptine Quick Release (Cycloset)     Insulin [] No []  Yes > total daily dose: units     STATIN INTOLERANCE (PER EHR/OTHER SOURCE DATA)  1. Was CK checked? [x] No [] Yes   >If yes, select from the following: [] CK not elevated [] CK elevated 1-5x upper limit of normal [] CK elevated >5x upper limit of normal  2. Does the patient have muscle symptoms? [] No [] Yes    >If yes, select from the following: Location and pattern of muscle symptoms (select all that apply) [] Symmetric, hip flexors or thighs [] Symmetric, calves [] Symmetric, proximal upper extremity [] Asymmetric, intermittent, or not specific to any area [] Unknown   Timing of muscle symptom in relation to starting statin regimen [] <4 weeks [] 4-12 weeks [] >12 weeks [] Unknown   Timing of muscle symptoms improvement after withdrawal of statin [] <2 weeks [] 2-4 weeks [] No improvement after 4 weeks [] Unknown  3. Was patient re-challenged with a statin regimen (even if same statin compound or regimen as above)?  [] No  [] Yes  [] Unknown  >If yes, select from the following: Timing of recurrence of similar muscle symptoms in relation to starting second regimen [] <4 weeks [] 4-12 weeks [] >12 weeks [] Similar symptoms did not recur [] Unknown   [email or web address]

## 2019-05-03 NOTE — Progress Notes (Signed)
Assessment and Plan:  William Haney was seen today for sore throat.  Diagnoses and all orders for this visit:  Acute pharyngitis, unspecified etiology Benign exam, mild sx, not suggestive of bacterial infection  take medications as prescribed, increase fluids,  Salt water gargles. Avoid sugar and dairy.  Suggested getting on OTC nasal sprays for nasal congestion; add oral antihistamine if symptoms relapse after completing prednisone taper to control post nasal drip If symptoms do not improve in 5-7 days or get worse (with fever, etc) contact the office and will send in an antibiotic -     predniSONE (DELTASONE) 20 MG tablet; 2 tablets daily for 3 days, 1 tablet daily for 4 days.  Further disposition pending results of labs. Discussed med's effects and SE's.   Over 15 minutes of exam, counseling, chart review, and critical decision making was performed.   Future Appointments  Date Time Provider [place]  05/31/2019  9:00 AM William Mutters, PA-C [facility] None  11/22/2019 10:00 AM William Mutters, PA-C [facility] None    ------------------------------------------------------------------------------------------------------------------   HPI BP 110/64   Pulse 70   Temp (!) 96.3 F (35.7 C)   Wt 133 lb (60.3 kg)   SpO2 98%   BMI 25.13 kg/m   69 y.o.male with hx of T2DM, htn, CAD presents for evaluation of sore throat, reports persistent sore throat for 2-3 weeks. Onset was gradual, reports intermittently severe "like swallowing needles" bilaterally initially but seems to be improved, best he has felt since this started today. He endorses occasional non-productive cough. Endorses intermittent nasal congestion, post-nasal drip, denies headache, sinus pressure, fever/chills, sneezing itching.    He reports has improved some but keeps coming back. Has been doing ginger tea and tylenol/advil/motrin for pain, OTC sore throat syrup which helps some but doesn't seem to be resolving. Started  salt water gargles last night which seems to have improved sx. "Just wanted to be sure."    Past Medical History:  Diagnosis Date  . Anemia   . Carotid artery stenosis 01/2017   L-ICA 1-39%, R-ICA 40-59% doppler  . Coronary artery disease    s/p CABG in 2008, w/ LIMA-LAD, SVG-Diag, SVG-OM, SVG-PL  . Diabetes mellitus type II   . Hyperlipidemia   . Hypertension   . Vitamin D deficiency      Allergies  Allergen Reactions  . Ace Inhibitors Cough  . Statins Other (See Comments)    Elevated LFT's    Current Outpatient Medications on File Prior to Visit  Medication Sig  . aspirin EC 325 MG tablet Take 1 tablet (325 mg total) by mouth 2 (two) times daily after a meal. Take x 1 month post op to decrease risk of blood clots.  . blood glucose meter kit and supplies Test sugars once to twice daily  . Cholecalciferol (VITAMIN D3) 125 MCG (5000 UT) CAPS Take 5,000 Units by mouth 4 (four) times a week.   . Cinnamon 500 MG capsule Take 2 capsules (1,000 mg total) by mouth 2 (two) times daily. (Patient taking differently: Take 500 mg by mouth daily. )  . empagliflozin (JARDIANCE) 10 MG TABS tablet Take 10 mg by mouth daily.  [name] glimepiride (AMARYL) 4 MG tablet Take 1 tablet 2 x  /day with Meals for DiabetesT  . metFORMIN (GLUCOPHAGE) 500 MG tablet Take 2 tablets 2 x /day with Meals for Diabetes  . metoprolol tartrate (LOPRESSOR) 25 MG tablet Take 1 tablet 2 x /day for BP  . Multiple Vitamin (MULTIVITAMIN) tablet Take  1 tablet by mouth daily.  [name] oxyCODONE-acetaminophen (PERCOCET/ROXICET) 5-325 MG tablet Take 1-2 tablets by mouth every 6 (six) hours as needed for severe pain.  . rosuvastatin (CRESTOR) 20 MG tablet Take 1 tablet Daily for Cholesterol (Patient taking differently: Take 20 mg by mouth at bedtime. )  . telmisartan (MICARDIS) 20 MG tablet Take 1 tablet Daily for BP & Kidney Protection   No current facility-administered medications on file prior to visit.    ROS: all negative except  above.   Physical Exam:  BP 110/64   Pulse 70   Temp (!) 96.3 F (35.7 C)   Wt 133 lb (60.3 kg)   SpO2 98%   BMI 25.13 kg/m   General Appearance: Well nourished, well dressed male in no apparent distress. Eyes: PERRLA, conjunctiva no swelling or erythema Sinuses: No Frontal/maxillary tenderness ENT/Mouth: Ext aud canals clear, TMs without erythema, bulging. No erythema, swelling, or exudate on post pharynx.  Tonsils not swollen or erythematous. Hearing normal.  Neck: Supple, thyroid normal.  Respiratory: Respiratory effort normal, BS equal bilaterally without rales, rhonchi, wheezing or stridor.  Cardio: RRR with no MRGs. Brisk peripheral pulses without edema.  Abdomen: Soft, + BS.  Non tender. Lymphatics: Non tender without lymphadenopathy.  Musculoskeletal: antalgic gait.  Skin: Warm, dry without rashes, lesions, ecchymosis.  Neuro: Normal muscle tone Psych: Awake and oriented X 3, normal affect, Insight and Judgment appropriate.     William Ribas, NP 9:59 AM [facility]

## 2019-05-04 ENCOUNTER — Encounter: Payer: Self-pay | Admitting: Adult Health

## 2019-05-04 ENCOUNTER — Other Ambulatory Visit: Payer: Self-pay

## 2019-05-04 ENCOUNTER — Ambulatory Visit (INDEPENDENT_AMBULATORY_CARE_PROVIDER_SITE_OTHER): Payer: Medicare Other | Admitting: Adult Health

## 2019-05-04 VITALS — BP 110/64 | HR 70 | Temp 96.3°F | Wt 133.0 lb

## 2019-05-04 DIAGNOSIS — J029 Acute pharyngitis, unspecified: Secondary | ICD-10-CM

## 2019-05-04 MED ORDER — PREDNISONE 20 MG PO TABS: ORAL_TABLET | ORAL | 0 refills | Status: DC

## 2019-05-04 NOTE — Patient Instructions (Addendum)
    Try adding CoQ 10 supplement -   If this doesn't help try adding tumeric + bioprene/black pepper fruit (helps with inflammation)   Increase fluids, avoid sugar, salt water gargles regularly  Will do a prednisone taper  Can pick up flonase and/or astalin nasal sprays  And try adding a daily allergy medication - claritin, or allegra, or zyrtec      Pharyngitis  Pharyngitis is a sore throat (pharynx). This is when there is redness, pain, and swelling in your throat. Most of the time, this condition gets better on its own. In some cases, you may need medicine. Follow these instructions at home:  Take over-the-counter and prescription medicines only as told by your doctor. ? If you were prescribed an antibiotic medicine, take it as told by your doctor. Do not stop taking the antibiotic even if you start to feel better. ? Do not give children aspirin. Aspirin has been linked to Reye syndrome.  Drink enough water and fluids to keep your pee (urine) clear or pale yellow.  Get a lot of rest.  Rinse your mouth (gargle) with a salt-water mixture 3-4 times a day or as needed. To make a salt-water mixture, completely dissolve -1 tsp of salt in 1 cup of warm water.  If your doctor approves, you may use throat lozenges or sprays to soothe your throat. Contact a doctor if:  You have large, tender lumps in your neck.  You have a rash.  You cough up green, yellow-brown, or bloody spit. Get help right away if:  You have a stiff neck.  You drool or cannot swallow liquids.  You cannot drink or take medicines without throwing up.  You have very bad pain that does not go away with medicine.  You have problems breathing, and it is not from a stuffy nose.  You have new pain and swelling in your knees, ankles, wrists, or elbows. Summary  Pharyngitis is a sore throat (pharynx). This is when there is redness, pain, and swelling in your throat.  If you were prescribed an antibiotic  medicine, take it as told by your doctor. Do not stop taking the antibiotic even if you start to feel better.  Most of the time, pharyngitis gets better on its own. Sometimes, you may need medicine. This information is not intended to replace advice given to you by your health care provider. Make sure you discuss any questions you have with your health care provider. Document Revised: 02/07/2017 Document Reviewed: 04/02/2016 Elsevier Patient Education  2020 [employer].

## 2019-05-21 ENCOUNTER — Other Ambulatory Visit: Payer: Self-pay | Admitting: Internal Medicine

## 2019-05-26 DIAGNOSIS — I700 Atherosclerosis of aorta: Secondary | ICD-10-CM | POA: Insufficient documentation

## 2019-05-26 NOTE — Progress Notes (Signed)
CPE AND FOLLOW UP Assessment:    Essential hypertension - continue medications, DASH diet, exercise and monitor at home. Call if greater than 130/80.  -     CBC with Differential/Platelet -     CMP/GFR -     TSH  Atherosclerosis of native coronary artery of native heart without angina pectoris Control blood pressure, cholesterol, glucose, increase exercise.  -     Lipid panel -     Hemoglobin A1c  Stenosis of carotid artery, unspecified laterality Control blood pressure, cholesterol, glucose, increase exercise.  Followed by cardiology; moderate disease -     Lipid panel -     Hemoglobin A1c  Type 2 diabetes mellitus with other circulatory complication, without long-term current use of insulin (HCC) Off of insulin On metformin, glimepiride, on jardiance Discussed general issues about diabetes pathophysiology and management., Educational material distributed., Suggested low cholesterol diet., Encouraged aerobic exercise., Discussed foot care., Reminded to get yearly retinal exam. -     Hemoglobin A1c  Diabetic polyneuropathy associated with type 2 diabetes mellitus (HCC) Intermittent burning in bilateral feet, none recent, declines medications Control sugars, check feet daily -     Hemoglobin A1c  Type 2 diabetes mellitus with stage 2 chronic kidney disease, without long-term current use of insulin ([place]) Discussed general issues about diabetes pathophysiology and management., Educational material distributed., Suggested low cholesterol diet., Encouraged aerobic exercise., Discussed foot care., Reminded to get yearly retinal exam- report for this year in system -     Hemoglobin A1c  Type 2 diabetes mellitus with diabetic retinopathy Control sugars, followed by Ophthalmology  Hyperlipidemia -continue medications, check lipids, decrease fatty foods, increase activity.  -     Lipid panel  Anemia, beta thalassemia minor trait -     CBC with Differential/Platelet  Medication  management -     Magnesium  Vitamin D deficiency Continue vitamin D supplement  Former smoker, 34 pack year history, quit 2007 -CXR 12/2018- will repeat in 1 year  Future Appointments  Date Time Provider [place]  11/22/2019 10:00 AM William Mutters, PA-C [facility] None  05/30/2020  9:00 AM William Mutters, PA-C [facility] None     Screening Tests Immunization History  Administered Date(s) Administered  . DT (Pediatric) 02/07/2015  . Influenza Whole 12/10/2012  . Influenza, High Dose Seasonal PF 02/07/2015, 01/02/2016, 12/13/2016, 01/01/2018, 11/19/2018  . Influenza-Unspecified 12/31/2013  . Meningococcal Conjugate 09/03/2016  . PFIZER SARS-COV-2 Vaccination 03/23/2019, 04/23/2019  . Pneumococcal Conjugate-13 02/02/2014  . Pneumococcal Polysaccharide-23 04/13/2003, 10/08/2016  . Td 04/18/2004   Health Maintenance  Topic Date Due  . OPHTHALMOLOGY EXAM  07/06/2019  . HEMOGLOBIN A1C  08/19/2019  . FOOT EXAM  11/19/2019  . COLONOSCOPY  08/22/2022  . TETANUS/TDAP  02/06/2025  . INFLUENZA VACCINE  Completed  . Hepatitis C Screening  Completed  . PNA vac Low Risk Adult  Completed   DEXA: declines Colonoscopy: 2014 due 2024  CXr 12/2018 has 34 pack year smoking hx, quit in 2007   EGD:N/A Echo 01/2008 Stress test 2013 MRI head 03/2007 Carotid US- 08/14/2018 - by William Haney   Names of Other Physician/Practitioners you currently use: 1.  [facility] here for primary care 2. [facility], William Haney, eye doctor, last visit 06/2018 3. Dr. Burt Haney, dentist, last visit several years ago - has full dentures  Patient Care Team: William Pinto, MD as PCP - General (Internal Medicine) William Breeding, MD as PCP - Cardiology (Cardiology) William Breath., MD as Consulting  Physician (Ophthalmology) William Breeding, MD as Consulting Physician (Cardiology) Clarene Essex, MD as Consulting Physician (Gastroenterology) Dorna Leitz, MD as  Consulting Physician (Orthopedic Surgery)   Subjective:  William Haney is a 69 y.o. male who presents for CPE and 3 month follow up for HTN, hyperlipidemia, diabetes, and vitamin D Def.    S/p left knee replacement with Dr. Berenice Haney, now have right knee pain.    BMI is Body mass index is 25.13 kg/m., he has been working on diet and exercise. Gets on stationary cycle 20-25 min daily.  Wt Readings from Last 3 Encounters:  05/31/19 133 lb (60.3 kg)  05/04/19 133 lb (60.3 kg)  02/18/19 129 lb 6.4 oz (58.7 kg)   He has a history of CAD s/p CABG in 2008, follows with William Haney. Also follows for moderate bilateral carotid stenosis getting annual carotid US He has a history of smoking, Q 2008, 35 pack year, last CXR 12/2018 before surgery.  His blood pressure has been controlled at home (120/80), today their BP is BP: 126/74 He does workout.  He denies chest pain, shortness of breath, dizziness.   He has been working on diet and exercise for diabetes  with history of CAD/PAD ,he is on 65m ASA With hyperlipidemia on crestor 20 at goal less than 70 With CKD he is on micardis Retinopathy - 06/2018 + retinopathy will be due in May he is on metformin 2000 mg daily glimepiride 4 mg only on 1 a day  jardiance 10 mg daily He does check fasting glucose, has been better and running 120-130 or so. HBartholome Billreported parasthesias in the past but none recently  Denies foot ulcerations, hyperglycemia, polydipsia, polyuria and visual disturbances.  Last A1C in the office was:  Lab Results  Component Value Date   HGBA1C 8.1 (H) 02/18/2019   Lab Results  Component Value Date   GFRNONAA 68 02/18/2019   Patient is on Vitamin D supplement.   Lab Results  Component Value Date   VD25OH 29 (L) 02/18/2019        Medication Review:  Current Outpatient Medications (Endocrine & Metabolic):  .  empagliflozin (JARDIANCE) 10 MG TABS tablet, Take 10 mg by mouth daily. .[name] glimepiride (AMARYL) 4 MG tablet,  Take 1 tablet 2 x  /day with Meals for DiabetesT .  metFORMIN (GLUCOPHAGE) 500 MG tablet, Take 2 tablets 2 x /day with Meals for Diabetes  Current Outpatient Medications (Cardiovascular):  .  metoprolol tartrate (LOPRESSOR) 25 MG tablet, Take 1 tablet 2 x /day for BP .  rosuvastatin (CRESTOR) 20 MG tablet, TAKE 1 TABLET BY MOUTH EVERY DAY FOR CHOLESTEROL .  telmisartan (MICARDIS) 20 MG tablet, Take 1 tablet Daily for BP & Kidney Protection   Current Outpatient Medications (Analgesics):  .  aspirin EC 325 MG tablet, Take 1 tablet (325 mg total) by mouth 2 (two) times daily after a meal. Take x 1 month post op to decrease risk of blood clots.   Current Outpatient Medications (Other):  .  blood glucose meter kit and supplies, Test sugars once to twice daily .  Cholecalciferol (VITAMIN D3) 125 MCG (5000 UT) CAPS, Take 5,000 Units by mouth 4 (four) times a week.  .  Cinnamon 500 MG capsule, Take 2 capsules (1,000 mg total) by mouth 2 (two) times daily. (Patient taking differently: Take 500 mg by mouth daily. ) .  Multiple Vitamin (MULTIVITAMIN) tablet, Take 1 tablet by mouth daily.  Current Problems (verified) Patient Active Problem List  Diagnosis Date Noted  . Aorto-iliac atherosclerosis ([place]) 05/26/2019  . Primary osteoarthritis of left knee 12/18/2018  . beta thalassemia minor (trait) anemia 03/08/2018  . Overweight (BMI 25.0-29.9) 08/10/2017  . Diabetic retinopathy of both eyes ([place]) 08/10/2017  . Poor compliance 09/09/2015  . DM type 2 (diabetes mellitus, type 2) ([place]) 09/08/2015  . Neuropathy, diabetic ([place]) 05/19/2015  . Encounter for Medicare annual wellness exam 09/21/2014  . Essential hypertension 10/11/2013  . Vitamin D deficiency 10/11/2013  . Medication management 10/11/2013  . Hyperlipidemia associated with type 2 diabetes mellitus ([place]) 03/01/2010  . Coronary atherosclerosis 03/01/2010  . Carotid stenosis 03/01/2010   Allergies Allergies  Allergen Reactions  . Ace  Inhibitors Cough  . Statins Other (See Comments)    Elevated LFT's    SURGICAL HISTORY He  has a past surgical history that includes Coronary artery bypass graft (2008) and Total knee arthroplasty (Left, 12/18/2018). FAMILY HISTORY His family history includes Diabetes in his sister; Heart attack in his father; Heart disease in his brother and father; Hyperlipidemia in his father; Hypertension in his father. SOCIAL HISTORY He  reports that he quit smoking about 13 years ago. He started smoking about 48 years ago. He has a 34.00 pack-year smoking history. He has never used smokeless tobacco. He reports that he does not drink alcohol or use drugs.   Objective:   Blood pressure 126/74, pulse 85, temperature (!) 97.5 F (36.4 C), height 5' 1"  (1.549 m), weight 133 lb (60.3 kg), SpO2 99 %. Body mass index is 25.13 kg/m.  General appearance: alert, no distress, WD/WN, male HEENT: normocephalic, sclerae anicteric, TMs pearly, nares patent, no discharge or erythema, pharynx normal Oral cavity: MMM, no lesions Neck: supple, no lymphadenopathy, no thyromegaly, no masses Heart: RRR, normal S1, S2, no murmurs Lungs: CTA bilaterally, no wheezes, rhonchi, or rales Abdomen: +bs, soft, non tender, non distended, no masses, no hepatomegaly, no splenomegaly Musculoskeletal: nontender, no swelling, varus deformity, antalgic gait, well healing vertical left knee scar , + crepitus right knee.  Extremities: no edema, no cyanosis, no clubbing Pulses: 2+ symmetric, upper and lower extremities, normal cap refill Neurological: alert, oriented x 3, CN2-12 intact, strength normal upper extremities and lower extremities, sensation abnormal bilateral feet, DTRs 2+ throughout, no cerebellar signs Psychiatric: normal affect, behavior normal, pleasant     William Mutters, PA-C   05/31/2019

## 2019-05-31 ENCOUNTER — Other Ambulatory Visit: Payer: Self-pay

## 2019-05-31 ENCOUNTER — Ambulatory Visit (INDEPENDENT_AMBULATORY_CARE_PROVIDER_SITE_OTHER): Payer: Medicare Other | Admitting: Physician Assistant

## 2019-05-31 ENCOUNTER — Encounter: Payer: Self-pay | Admitting: Physician Assistant

## 2019-05-31 VITALS — BP 126/74 | HR 85 | Temp 97.5°F | Ht 61.0 in | Wt 133.0 lb

## 2019-05-31 DIAGNOSIS — E559 Vitamin D deficiency, unspecified: Secondary | ICD-10-CM

## 2019-05-31 DIAGNOSIS — I708 Atherosclerosis of other arteries: Secondary | ICD-10-CM

## 2019-05-31 DIAGNOSIS — I2510 Atherosclerotic heart disease of native coronary artery without angina pectoris: Secondary | ICD-10-CM

## 2019-05-31 DIAGNOSIS — E1169 Type 2 diabetes mellitus with other specified complication: Secondary | ICD-10-CM | POA: Diagnosis not present

## 2019-05-31 DIAGNOSIS — I10 Essential (primary) hypertension: Secondary | ICD-10-CM | POA: Diagnosis not present

## 2019-05-31 DIAGNOSIS — Z0001 Encounter for general adult medical examination with abnormal findings: Secondary | ICD-10-CM

## 2019-05-31 DIAGNOSIS — E113293 Type 2 diabetes mellitus with mild nonproliferative diabetic retinopathy without macular edema, bilateral: Secondary | ICD-10-CM | POA: Diagnosis not present

## 2019-05-31 DIAGNOSIS — Z0000 Encounter for general adult medical examination without abnormal findings: Secondary | ICD-10-CM | POA: Diagnosis not present

## 2019-05-31 DIAGNOSIS — Z79899 Other long term (current) drug therapy: Secondary | ICD-10-CM

## 2019-05-31 DIAGNOSIS — E663 Overweight: Secondary | ICD-10-CM

## 2019-05-31 DIAGNOSIS — D509 Iron deficiency anemia, unspecified: Secondary | ICD-10-CM

## 2019-05-31 DIAGNOSIS — I700 Atherosclerosis of aorta: Secondary | ICD-10-CM

## 2019-05-31 DIAGNOSIS — I6523 Occlusion and stenosis of bilateral carotid arteries: Secondary | ICD-10-CM

## 2019-05-31 DIAGNOSIS — E1142 Type 2 diabetes mellitus with diabetic polyneuropathy: Secondary | ICD-10-CM | POA: Diagnosis not present

## 2019-05-31 DIAGNOSIS — N401 Enlarged prostate with lower urinary tract symptoms: Secondary | ICD-10-CM

## 2019-05-31 NOTE — Patient Instructions (Signed)
    Bad carbs also include fruit juice, alcohol, and sweet tea. These are empty calories that do not signal to your brain that you are full.   Please remember the good carbs are still carbs which convert into sugar. So please measure them out no more than 1/2-1 cup of rice, oatmeal, pasta, and beans  Veggies are however free foods! Pile them on.   Not all fruit is created equal. Please see the list below, the fruit at the bottom is higher in sugars than the fruit at the top. Please avoid all dried fruits.     What does your A1C results mean?  Your A1C is a measure of your sugar over the past 3 months   Use this chart as a guide to compare the results of your A1C blood test to your estimated average daily blood sugar:  A1C Range Average Sugar  4.0-6.0% 60-120 mg/dl  6.1-7.0% 121 - 150 mg/dl  7.1-8.0% 151-180 mg/dl  8.1-9.0% 181-210 mg/dl  10.1-11% 211-240 mg/dl  11.1-12.0% 271-300 mg/dl  12.1-13.0% 301-330 mg/dl  13.1-14.0% 331-360 mg/dl  Greater than 14.0% Greater than 360 mg/dl

## 2019-06-01 LAB — CBC WITH DIFFERENTIAL/PLATELET
Absolute Monocytes: 626 cells/uL (ref 200–950)
Basophils Absolute: 50 cells/uL (ref 0–200)
Basophils Relative: 0.8 %
Eosinophils Absolute: 428 cells/uL (ref 15–500)
Eosinophils Relative: 6.9 %
HCT: 41.2 % (ref 38.5–50.0)
Hemoglobin: 12 g/dL — ABNORMAL LOW (ref 13.2–17.1)
Lymphs Abs: 1358 cells/uL (ref 850–3900)
MCH: 19.6 pg — ABNORMAL LOW (ref 27.0–33.0)
MCHC: 29.1 g/dL — ABNORMAL LOW (ref 32.0–36.0)
MCV: 67.3 fL — ABNORMAL LOW (ref 80.0–100.0)
MPV: 11.9 fL (ref 7.5–12.5)
Monocytes Relative: 10.1 %
Neutro Abs: 3739 cells/uL (ref 1500–7800)
Neutrophils Relative %: 60.3 %
Platelets: 345 10*3/uL (ref 140–400)
RBC: 6.12 10*6/uL — ABNORMAL HIGH (ref 4.20–5.80)
RDW: 18.7 % — ABNORMAL HIGH (ref 11.0–15.0)
Total Lymphocyte: 21.9 %
WBC: 6.2 10*3/uL (ref 3.8–10.8)

## 2019-06-01 LAB — LIPID PANEL
Cholesterol: 120 mg/dL (ref <200)
HDL: 39 mg/dL — ABNORMAL LOW (ref >=40)
LDL Cholesterol (Calc): 65 mg/dL (calc)
Non-HDL Cholesterol (Calc): 81 mg/dL (calc) (ref <130)
Total CHOL/HDL Ratio: 3.1 (calc) (ref <5.0)
Triglycerides: 75 mg/dL (ref <150)

## 2019-06-01 LAB — URINALYSIS, ROUTINE W REFLEX MICROSCOPIC
Bilirubin Urine: NEGATIVE
Hgb urine dipstick: NEGATIVE
Ketones, ur: NEGATIVE
Leukocytes,Ua: NEGATIVE
Nitrite: NEGATIVE
Protein, ur: NEGATIVE
Specific Gravity, Urine: 1.039 — ABNORMAL HIGH (ref 1.001–1.03)
pH: <=5 (ref 5.0–8.0)

## 2019-06-01 LAB — COMPLETE METABOLIC PANEL WITH GFR
AG Ratio: 1.6 (calc) (ref 1.0–2.5)
ALT: 14 U/L (ref 9–46)
AST: 13 U/L (ref 10–35)
Albumin: 4 g/dL (ref 3.6–5.1)
Alkaline phosphatase (APISO): 53 U/L (ref 35–144)
BUN: 19 mg/dL (ref 7–25)
CO2: 25 mmol/L (ref 20–32)
Calcium: 9.3 mg/dL (ref 8.6–10.3)
Chloride: 104 mmol/L (ref 98–110)
Creat: 1.11 mg/dL (ref 0.70–1.18)
GFR, Est African American: 78 mL/min/{1.73_m2} (ref >=60)
GFR, Est Non African American: 67 mL/min/{1.73_m2} (ref >=60)
Globulin: 2.5 g/dL (calc) (ref 1.9–3.7)
Glucose, Bld: 162 mg/dL — ABNORMAL HIGH (ref 65–99)
Potassium: 4.7 mmol/L (ref 3.5–5.3)
Sodium: 137 mmol/L (ref 135–146)
Total Bilirubin: 0.5 mg/dL (ref 0.2–1.2)
Total Protein: 6.5 g/dL (ref 6.1–8.1)

## 2019-06-01 LAB — MICROALBUMIN / CREATININE URINE RATIO
Creatinine, Urine: 71 mg/dL (ref 20–320)
Microalb Creat Ratio: 7 mcg/mg creat (ref <30)
Microalb, Ur: 0.5 mg/dL

## 2019-06-01 LAB — MAGNESIUM: Magnesium: 2 mg/dL (ref 1.5–2.5)

## 2019-06-01 LAB — HEMOGLOBIN A1C
Hgb A1c MFr Bld: 10.3 % of total Hgb — ABNORMAL HIGH (ref <5.7)
Mean Plasma Glucose: 249 (calc)
eAG (mmol/L): 13.8 (calc)

## 2019-06-01 LAB — PSA: PSA: 0.8 ng/mL (ref <=4.0)

## 2019-06-01 LAB — VITAMIN D 25 HYDROXY (VIT D DEFICIENCY, FRACTURES): Vit D, 25-Hydroxy: 47 ng/mL (ref 30–100)

## 2019-06-01 LAB — TSH: TSH: 2.07 mIU/L (ref 0.40–4.50)

## 2019-06-01 LAB — CBC MORPHOLOGY

## 2019-06-14 ENCOUNTER — Encounter: Payer: Self-pay | Admitting: *Deleted

## 2019-06-14 DIAGNOSIS — Z006 Encounter for examination for normal comparison and control in clinical research program: Secondary | ICD-10-CM

## 2019-06-14 NOTE — Research (Addendum)
COORDINATE-Diabetes 12 Month CASE REPORT FORM (Intervention) -EHR Site #:   [id number]              Patient ID:           [id number]    2 [facility] EHR REVIEW  Medical Record Check Date 06/14/19   Vital Status [x] Patient Alive >Date last known alive per EHR: 05/31/2019 [] Patient Dead >> Complete Death Form  [] Unknown   CLINICAL EVENTS / PROCEDURES  Hospitalization since last visit? (>=24 hour stay) [x] No [] Yes  >> if yes, Complete the following  Date of hospital admission:        / /             MM DD YYYY  Primary discharge diagnosis:  *Complete appropriate event validation form [] acute myocardial infarction (heart attack)* [] stroke* [] heart failure* [] coronary revascularization* [] peripheral revascularization* [] cerebral revascularization* [] diabetes (e.g. hypoglycemia, DKA) [] renal failure [] amputation [] other cardiovascular reason [] other NON-cardiovascular reason [] unknown  Other diagnoses not documented above: (check all that apply)  *Complete appropriate event validation form [] acute myocardial infarction (heart attack)* [] stroke* [] heart failure* [] coronary revascularization* [] peripheral revascularization* [] cerebral revascularization* [] diabetes (e.g. hypoglycemia, DKA) [] renal failure [] amputation [] other cardiovascular reason [] other NON-cardiovascular reason [] unknown  Were any of the following outpatient procedures done since the last visit? (I.e. procedures not captured above)  Coronary revascularization [x] No [] Yes >IF YES, Date / /             MM DD YYYY  Peripheral revascularization [x] No [] Yes > IF YES, Date / /             MM DD YYYY  Cerebral revascularization [x] No [] Yes > IF YES, Date / /             MM DD YYYY  Extremity amputation    [x] No [] Yes >IF YES, Date / /             MM      DD       YYYY  Renal replacement therapy (i.e. dialysis)    [x] No [] Yes > IF YES, Date of initiation / /             MM      DD       YYYY   **EDC will allow for  collection of multiple hospitalizations and procedures   MEDICATIONS  Medication Currently Prescribed? If started since last visit: If not started since last visit: If stopped since last visit:  Cardiac Medications  ACE Inhibitor / Angiotensin Receptor Blocker (ARB) / Angiotensin Receptor Neprilysin inhibitor (ARNi)  [] No >  [x] Yes > Since last visit, medication was: [] Stopped [] Not started [] Started [x] Continued same medication [] Continued with medication changes Date started:        / /             MM DD YYYY Who prescribed? [] Cardiology provider  [] Study clinic [] Outside clinic [] Endocrinology provider [] Primary care provider [] Other provider  Specify:                             [] Unknown Reason (check all that apply): [] History of swelling around lips, eyes or face [] Feeling dizzy/lightheaded [] Low blood pressure [] Poor or fluctuating kidney function [] High potassium [] Patient has experienced other side effects to this medication  before [] Patient will be unable to adhere/monitor [] Patient unable to afford it [] Patient does not want to      take this medication [] Pregnancy [] Other (specify: ) [] Unknown Reason  Date discontinued:        / /             MM DD YYYY Reason (check all that apply): [] Swelling around lips, eyes       or face [] Feeling dizzy/lightheaded [] Low blood pressure [] Poor or fluctuating kidney function [] High potassium [] Other medication side       effects [] Patient unable to        adhere/monitor [] Had an       operation/procedure that        required stopping it [] Patient unable to afford it [] Patient no longer wants       to take this medication [] Pregnancy [] Other (specify: ) [] Unknown Reason   If started or changed  Medication Name: [] Benazepril (Lotensin) [] Captopril (Capoten) [] Enalapril (Vasotec) [] Fosinopril (Monopril) [] Lisinopril (Zestril, Prinivil) [] Quinapril (Accupril) [] Ramipril (Altace) [] Azilsartan  (Edarbi) [] Candesartan (Atacand) [] Irbesartan (Avapro) [] Losartan (Cozaar) [] Olmesartan (Benicar) [x] Telmisartan (Micardis) [] Valsartan (Diovan) [] Sacrubitril/Valsartan (Entresto)     Beta Blocker [] No [x]  Yes > [] Acebutolol (Sectral) [] Bisoprolol (Zebeta) [] Carvedilol (Coreg) [] Labetalol (Trandate,      Normodyne) [] Metoprolol succinate (Toprol) [x] Metoprolol tartrate       (Lopressor) [] Nadolol (Corgard) [] Nebivolol (Bystolic) [] Propranolol (Inderal) [] Sotalol (Betapace)     Medication Currently Prescribed? If started since last visit: If not started since last visit: If stopped since last visit:  Aldosterone Antagonist [x] No [] Yes > [] Amiloride [] Eplerenone (Inspra) [] Spirinolactone (Aldactone) [] Traimterene (Dyrenium)   Calcium Channel Blocker [x] No []  Yes > Medication Name: [] Amlodipine (Norvasc) [] Diltiazem (Cardizem) [] Felodipine (Plendil) [] Nifedipine (Procardia) [] Verapamil (Calan)   Diuretic Loop [x] No []  Yes > Medication Name: [] Bumetanide (Bumex) [] Ethacrynic acid (Edecrin) [] Furosemide (Lasix) [] Torsemide (Demadex)   Diuretic Thiazide- type [x] No []  Yes > Medication Name: [] Chlorothiazide      [] Chlorthalidone [] Hydrochlorothiazide [] Indapamide [] Metolazone   Anticoagulation Therapy (other than Warfarin) [x] No []  Yes > Medication Name: [] Apixaban (Eliquis) [] Edoxaban (Lixiana) [] Rivaroxaban (Xeralto) [] Dabigatran (Redaxa)   Warfarin [x] No []  Yes    Antiplatelet Agent (including aspirin) [] No [x]  Yes > Medication Name (check all that apply): [x] Aspirin [] Clopidogrel (Plavix) [] Prasugrel (Effient) [] Ticagrelor (Brilinta) [] Ticlopidine (Ticlid) [] Dipyridamole (Persantine)    Medication Currently Prescribed? If started since last visit: If not started since last visit: If stopped since last visit:  Statin  [] No >  [x] Yes > Since last visit, medication was: [] Stopped [] Not started [] Started [x] Continued same  medication and dose [] Continued with dose or medication changes Date started:        / /             MM DD YYYY Who prescribed? [] Cardiology provider [] Endocrinology provider [] Primary care provider [] Other provider  Specify:                             [] Unknown Reason (check all that apply): [] History of Rhabdomyolysis [] LDL-cholesterol already       <70 [] Muscle      aches/pain/weakness [] Mental       fogginess/memory loss [] Liver dysfunction [] Patient has  experienced other side   effects to this medication before [] Patient will be unable to adhere/monitor [] Patient unable to afford it [] Patient does not want to take this medication [] Pregnancy [] Other (specify: ) [] Unknown Reason Date discontinued:        / /             MM DD YYYY Reason (check all that apply): [] Rhabdomyolysis [] Muscle aches/pain/weakness [] Mental fogginess/memory loss [] Liver dysfunction [] Other medication side effects [] Patient unable to  adhere/monitor [] Patient unable to afford it [] Patient no longer wants to take       this medication [] Pregnancy [] Other (specify: ) [] Unknown Reason   If started or changed  Medication Name: [] Atorvastatin (Lipitor) [] Fluvastatin (Lescol) [] Lovastatin (Mevacor) [] Pravastatin (Pravachol) [] Rosuvastatin (Crestor) [] Simvastatin (Zocor) [] Pitatavastatin (Livalo)  Dose: [] 1 mg [] 10 mg [] 2 mg []  20 mg [] 3 mg []  40 mg [] 4 mg []  60 mg [] 5 mg []  80 mg  Frequency: [] Daily  [] Less than daily      Does the patient have statin intolerance that prevents the use of maximum dose of high potency statin? [] No [] Yes >IF YES, Complete Statin Intolerance form   Non-statin lipid lowering therapy [x] No [] Yes > Medication Name (check all that apply): [] Colesevelam (Welchol) [] Ezetimibe (Zetia) [] Fibrate [] Niacin [] PCSK9 inhibitor [] Omega 3 acid ethyl esters (Lovaza) [] Icosapent Ethyl (Vascepa) [] Over the counter omega 3 fatty acid or fish oil  supplement     Medication Currently Prescribed? If started since last visit: If not started since last visit: If stopped since last visit:  Diabetes Medications  SGLT2 Inhibitor  [] No >  [x] Yes > Since last visit, medication was: [] Stopped [] Not started [] Started [x] Continued same medication [] Continued with medication changes Date started:        / /             MM DD YYYY Who prescribed? [] Cardiology provider  [] Study clinic [] Outside clinic [] Endocrinology      provider [] Primary care provider [] Other provider  Specify:                             [] Unknown Reason (check all that apply): [] eGFR <45 [] HbA1c<7% on metformin monotherapy OR already on GLP1RA and do not need to start another anti- hyperglycemic [] Already dehydrated [] Low blood pressure [] High risk of Hypoglycemia [] Prior DKA [] Recurrent mycotic genital infections [] History of or at risk for amputation [] Patient has experienced other side effects to this medication before [] Patient will be unable to adhere/monitor [] Patient unable to afford it [] Patient does not want to take this medication [] Pregnancy [] Other (specify: ) [] Unknown Reason Date discontinued:        / /             MM DD YYYY Reason (check all that apply  [] eGFR now <45 [] Dehydration [] Low blood pressure [] Hypoglycemia [] DKA [] Mycotic genital infection [] Amputation [] Other medication side effects [] Patient unable    to adhere/monitor  [] Had an operation/procedure     that required stopping it [] Patient unable to afford it [] Patient no longer wants to      take this medication [] Pregnancy [] Other (specify: ) [] Unknown Reason   If started or changed  Medication Name: [] Canaglifozin (Invokana) [] Dapagliflozin Wilder Glade) [] Empaglifozin (Jardiance) [] Ertugliflozin Actuary)           Medication Currently Prescribed? If started since last visit: If not started since last visit: If stopped since last visit:  GLP1  Receptor Agonist  [x] No >  [] Yes > Since last visit, medication was: [] Stopped [x] Not started [] Started [] Continued same medication [] Continued with medication changes Date started:        / /             MM DD YYYY Who prescribed? [] Cardiology provider  [] Study clinic [] Outside clinic [] Endocrinology       provider [] Primary care provider [] Other provider > Specify:                             []   Unknown Reason (check all that apply): [] Personal or family history of medullary thyroid cancer [] MEN2 [] HbA1c<7% on metformin monotherapy OR already on SGLT2i and do not need to start another anti-hyperglycemic [] eGFR now <30 [] High risk of Hypoglycemia [] History of pancreatitis [] Significant gastroparesis [] Prior gastric surgery [] Patient has experienced other side effects to this medication before [] Patient will be unable to adhere/monitor [] Patient unable to afford it [] Patient does not want to take this medication [] Pregnancy [] Other (specify: ) [] Unknown Reason Date discontinued:        / /             MM DD YYYY Reason (check all that apply): [] Medullary thyroid cancer [] MEN2 [] eGFR now <30 [] Hypoglycemia [] Pancreatitis [] Significant gastroparesis [] Gastric surgery [] Other medication side       effects []   Patient  unable    to adhere/monitor                                  [] Had an operation/procedure that required stopping it [] Patient unable to afford it [] Patient no longer wants to take this medication [] Pregnancy [] Other (specify: ) [] Unknown Reason   If started or changed > Medication Name: [] Albiglutide (Tanzeum) [] Dulaglutide (Trulicity) [] Exanatide (Byetta, Bydureon) [] Liraglutide (Victoza, Saxenda) [] Lixisenatide (Adlyxin) [] Semaglutice (Ozempic)      Medication Currently Prescribed? If started since last visit: If not started since last visit: If stopped since last visit:  Other non Insulin diabetes medications [] No [x] Yes >  Medication Name (check all that apply): [] Acarbose (Precose) [] Miglitol (Glyset) [] Glimepiride (Amaryl) [x] Glipizide (Amaryl) [] Glyburide (Diabeta,       Glynase,   Micronase) [x] Metformin (Fortamet,        Glucophage[including XR],        Glumetza, Riomet) [] Pioglitazone (Actos) [] Nateglinide (Starlix) [] Pramlintide (Symilin) [] Repaglinide (Prandin) [] Rosiglitazone (Avandia) [] Alogliptin (Nesina) [] Linagliptin (Tradjenta) [] Saxagliptin (Onglyza) [] Sitagliptin (Januvia) [] Bromocriptine Quick Release (Cycloset)     Insulin [x] No []  Yes > total daily dose: units     STATIN INTOLERANCE (PER EHR/OTHER SOURCE DATA)  1. Was CK checked? [x] No [] Yes   >If yes, select from the following: [] CK not elevated [] CK elevated 1-5x upper limit of normal [] CK elevated >5x upper limit of normal  2. Does the patient have muscle symptoms? [] No [] Yes    >If yes, select from the following: Location and pattern of muscle symptoms (select all that apply) [] Symmetric, hip flexors or thighs [] Symmetric, calves [] Symmetric, proximal upper extremity [] Asymmetric, intermittent, or not specific to any area [] Unknown   Timing of muscle symptom in relation to starting statin regimen [] <4 weeks [] 4-12 weeks [] >12 weeks [] Unknown   Timing of muscle symptoms improvement after withdrawal of statin [] <2 weeks [] 2-4 weeks [] No improvement after 4 weeks [] Unknown  3. Was patient re-challenged with a statin regimen (even if same statin compound or regimen as above)?  [] No  [] Yes  [] Unknown  >If yes, select from the following: Timing of recurrence of similar muscle symptoms in relation to starting second regimen [] <4 weeks [] 4-12 weeks [] >12 weeks [] Similar symptoms did not recur [] Unknown   [email or web address]

## 2019-06-23 ENCOUNTER — Other Ambulatory Visit: Payer: Self-pay | Admitting: Internal Medicine

## 2019-06-28 DIAGNOSIS — M25562 Pain in left knee: Secondary | ICD-10-CM | POA: Diagnosis not present

## 2019-06-28 DIAGNOSIS — M1711 Unilateral primary osteoarthritis, right knee: Secondary | ICD-10-CM | POA: Diagnosis not present

## 2019-06-28 DIAGNOSIS — M25561 Pain in right knee: Secondary | ICD-10-CM | POA: Diagnosis not present

## 2019-06-28 DIAGNOSIS — Z96652 Presence of left artificial knee joint: Secondary | ICD-10-CM | POA: Diagnosis not present

## 2019-07-26 ENCOUNTER — Encounter: Payer: Self-pay | Admitting: Internal Medicine

## 2019-08-16 ENCOUNTER — Ambulatory Visit (HOSPITAL_COMMUNITY)
Admission: RE | Admit: 2019-08-16 | Payer: Medicare Other | Source: Ambulatory Visit | Attending: Internal Medicine | Admitting: Internal Medicine

## 2019-08-26 ENCOUNTER — Other Ambulatory Visit: Payer: Self-pay | Admitting: Internal Medicine

## 2019-08-26 DIAGNOSIS — I10 Essential (primary) hypertension: Secondary | ICD-10-CM

## 2019-08-26 DIAGNOSIS — T464X5A Adverse effect of angiotensin-converting-enzyme inhibitors, initial encounter: Secondary | ICD-10-CM

## 2019-09-06 ENCOUNTER — Ambulatory Visit (HOSPITAL_COMMUNITY)
Admission: RE | Admit: 2019-09-06 | Discharge: 2019-09-06 | Disposition: A | Discharge status: Home / Self Care | Payer: Medicare Other | Source: Ambulatory Visit | Attending: Cardiology | Admitting: Cardiology

## 2019-09-06 ENCOUNTER — Other Ambulatory Visit: Payer: Self-pay

## 2019-09-06 ENCOUNTER — Other Ambulatory Visit (HOSPITAL_COMMUNITY): Payer: Self-pay | Admitting: Internal Medicine

## 2019-09-06 DIAGNOSIS — I779 Disorder of arteries and arterioles, unspecified: Secondary | ICD-10-CM

## 2019-09-06 DIAGNOSIS — I6523 Occlusion and stenosis of bilateral carotid arteries: Secondary | ICD-10-CM | POA: Diagnosis not present

## 2019-09-06 NOTE — Progress Notes (Signed)
History of Present Illness:       This very nice 69 y.o. William Haney presents for 3 month follow up with HTN, HLD, T2_NIDDM and Vitamin D Deficiency.       Patient is treated for HTN & BP has been controlled at home. Today's BP is at goal - 122/74.  In 2008, patient underwent a CABG and is also followed by Dr Percival Spanish.  Patient has had no complaints of any cardiac type chest pain, palpitations, dyspnea / orthopnea / PND, dizziness, claudication, or dependent edema.      Hyperlipidemia is controlled with diet & Rosuvastatin. Patient denies myalgias or other med SE's. Last Lipids were   Lab Results  Component Value Date   CHOL 120 05/31/2019   HDL 39 (L) 05/31/2019   LDLCALC 65 05/31/2019   TRIG 75 05/31/2019   CHOLHDL 3.1 05/31/2019    Also, the patient has history of T2_NIDDM since 2000 and has had no symptoms of reactive hypoglycemia, diabetic polys, paresthesias or visual blurring.  Last A1c on Metformin , Glimepiride and Jardiance was not at goal.  Lab Results  Component Value Date   HGBA1C 10.3 (H) 05/31/2019           Further, the patient also has history of Vitamin D Deficiency ("37" / 2017) and supplements vitamin D without any suspected side-effects. Last vitamin D was not at goal:  Lab Results  Component Value Date   VD25OH 47 05/31/2019    Current Outpatient Medications on File Prior to Visit  Medication Sig  . aspirin EC 81 MG tablet Take 81 mg by mouth daily. Swallow whole.  . blood glucose meter kit and supplies Test sugars once to twice daily  . Cholecalciferol (VITAMIN D3) 125 MCG (5000 UT) CAPS Take 5,000 Units by mouth 4 (four) times a week.   . Cinnamon 500 MG capsule Take 2 capsules (1,000 mg total) by mouth 2 (two) times daily. (Patient taking differently: Take 500 mg by mouth daily. )  . glimepiride (AMARYL) 4 MG tablet TAKE 1 TABLET BY MOUTH TWICE DAILY WITH MEALS FOR DIABETES  . metFORMIN (GLUCOPHAGE) 500 MG tablet Take 2 tablets 2 x /day with  Meals for Diabetes  . metoprolol tartrate (LOPRESSOR) 25 MG tablet Take 1 tablet 2 x /day for BP  . Multiple Vitamin (MULTIVITAMIN) tablet Take 1 tablet by mouth daily.  . rosuvastatin (CRESTOR) 20 MG tablet TAKE 1 TABLET BY MOUTH EVERY DAY FOR CHOLESTEROL  . telmisartan (MICARDIS) 20 MG tablet TAKE 1 TABLET DAILY FOR BP & KIDNEY PROTECTION  . empagliflozin (JARDIANCE) 10 MG TABS tablet Take 10 mg by mouth daily. (Patient not taking: Reported on 09/07/2019)   No current facility-administered medications on file prior to visit.    Allergies  Allergen Reactions  . Ace Inhibitors Cough  . Statins Other (See Comments)    Elevated LFT's    PMHx:   Past Medical History:  Diagnosis Date  . Anemia   . Carotid artery stenosis 01/2017   L-ICA 1-39%, R-ICA 40-59% doppler  . Coronary artery disease    s/p CABG in 2008, w/ LIMA-LAD, SVG-Diag, SVG-OM, SVG-PL  . Diabetes mellitus type II   . Hyperlipidemia   . Hypertension   . Vitamin D deficiency     Immunization History  Administered Date(s) Administered  . DT (Pediatric) 02/07/2015  . Influenza Whole 12/10/2012  . Influenza, High Dose Seasonal PF 02/07/2015, 01/02/2016, 12/13/2016, 01/01/2018, 11/19/2018  . Influenza-Unspecified 12/31/2013  .  Meningococcal Conjugate 09/03/2016  . PFIZER SARS-COV-2 Vaccination 03/23/2019, 04/23/2019  . Pneumococcal Conjugate-13 02/02/2014  . Pneumococcal Polysaccharide-23 04/13/2003, 10/08/2016  . Td 04/18/2004    Past Surgical History:  Procedure Laterality Date  . CORONARY ARTERY BYPASS GRAFT  2008   LIMA to the LAD, saphenous vein graft to diagonal, saphenous vein graft to OM, saphenous vein graft to posterolateral 2008  . TOTAL KNEE ARTHROPLASTY Left 12/18/2018   Procedure: TOTAL KNEE ARTHROPLASTY;  Surgeon: Dorna Leitz, MD;  Location: [facility];  Service: Orthopedics;  Laterality: Left;    FHx:    Reviewed / unchanged  SHx:    Reviewed / unchanged   Systems Review:  Constitutional:  Denies fever, chills, wt changes, headaches, insomnia, fatigue, night sweats, change in appetite. Eyes: Denies redness, blurred vision, diplopia, discharge, itchy, watery eyes.  ENT: Denies discharge, congestion, post nasal drip, epistaxis, sore throat, earache, hearing loss, dental pain, tinnitus, vertigo, sinus pain, snoring.  CV: Denies chest pain, palpitations, irregular heartbeat, syncope, dyspnea, diaphoresis, orthopnea, PND, claudication or edema. Respiratory: denies cough, dyspnea, DOE, pleurisy, hoarseness, laryngitis, wheezing.  Gastrointestinal: Denies dysphagia, odynophagia, heartburn, reflux, water brash, abdominal pain or cramps, nausea, vomiting, bloating, diarrhea, constipation, hematemesis, melena, hematochezia  or hemorrhoids. Genitourinary: Denies dysuria, frequency, urgency, nocturia, hesitancy, discharge, hematuria or flank pain. Musculoskeletal: Denies arthralgias, myalgias, stiffness, jt. swelling, pain, limping or strain/sprain.  Skin: Denies pruritus, rash, hives, warts, acne, eczema or change in skin lesion(s). Neuro: No weakness, tremor, incoordination, spasms, paresthesia or pain. Psychiatric: Denies confusion, memory loss or sensory loss. Endo: Denies change in weight, skin or hair change.  Heme/Lymph: No excessive bleeding, bruising or enlarged lymph nodes.  Physical Exam  BP 122/74   Pulse 68   Temp 97.6 F (36.4 C)   Resp 16   Ht 5' 1"  (1.549 m)   Wt 133 lb 6.4 oz (60.5 kg)   BMI 25.21 kg/m   Appears  well nourished, well groomed  and in no distress.  Eyes: PERRLA, EOMs, conjunctiva no swelling or erythema. Sinuses: No frontal/maxillary tenderness ENT/Mouth: EAC's clear, TM's nl w/o erythema, bulging. Nares clear w/o erythema, swelling, exudates. Oropharynx clear without erythema or exudates. Oral hygiene is good. Tongue normal, non obstructing. Hearing intact.  Neck: Supple. Thyroid not palpable. Car 2+/2+ without bruits, nodes or JVD. Chest:  Respirations nl with BS clear & equal w/o rales, rhonchi, wheezing or stridor.  Cor: Heart sounds normal w/ regular rate and rhythm without sig. murmurs, gallops, clicks or rubs. Peripheral pulses normal and equal  without edema.  Abdomen: Soft & bowel sounds normal. Non-tender w/o guarding, rebound, hernias, masses or organomegaly.  Lymphatics: Unremarkable.  Musculoskeletal: Full ROM all peripheral extremities, joint stability, 5/5 strength and normal gait.  Skin: Warm, dry without exposed rashes, lesions or ecchymosis apparent.  Neuro: Cranial nerves intact, reflexes equal bilaterally. Sensory-motor testing grossly intact. Tendon reflexes grossly intact.  Pysch: Alert & oriented x 3.  Insight and judgement nl & appropriate. No ideations.  Assessment and Plan:  1. Essential hypertension  - Continue medication, monitor blood pressure at home.  - Continue DASH diet.  Reminder to go to the ER if any CP,  SOB, nausea, dizziness, severe HA, changes vision/speech.  - CBC with Differential/Platelet - COMPLETE METABOLIC PANEL WITH GFR - Magnesium - TSH  2. Hyperlipidemia associated with type 2 diabetes mellitus ([facility])  - Continue diet/meds, exercise,& lifestyle modifications.  - Continue monitor periodic cholesterol/liver & renal functions   - Lipid panel - TSH  3.  Type 2 diabetes mellitus with stage 2 chronic kidney disease,  without long-term current use of insulin (HCC)  - Continue diet, exercise  - Lifestyle modifications.  - Monitor appropriate labs.  - Hemoglobin A1c - Insulin, random  4. Vitamin D deficiency  - Continue supplementation.  - VITAMIN D 25 Hydroxy   5. Medication management  - CBC with Differential/Platelet - COMPLETE METABOLIC PANEL WITH GFR - Magnesium - Lipid panel - TSH - Hemoglobin A1c - Insulin, random - VITAMIN D 25 Hydroxy        Discussed  regular exercise, BP monitoring, weight control to achieve/maintain BMI less than 25 and discussed  med and SE's. Recommended labs to assess and monitor clinical status with further disposition pending results of labs.  I discussed the assessment and treatment plan with the patient. The patient was provided an opportunity to ask questions and all were answered. The patient agreed with the plan and demonstrated an understanding of the instructions.  I provided over 30 minutes of exam, counseling, chart review and  complex critical decision making.   Kirtland Bouchard, MD

## 2019-09-07 ENCOUNTER — Other Ambulatory Visit: Payer: Self-pay

## 2019-09-07 ENCOUNTER — Ambulatory Visit (INDEPENDENT_AMBULATORY_CARE_PROVIDER_SITE_OTHER): Payer: Medicare Other | Admitting: Internal Medicine

## 2019-09-07 ENCOUNTER — Encounter: Payer: Self-pay | Admitting: Internal Medicine

## 2019-09-07 VITALS — BP 122/74 | HR 68 | Temp 97.6°F | Resp 16 | Ht 61.0 in | Wt 133.4 lb

## 2019-09-07 DIAGNOSIS — G8929 Other chronic pain: Secondary | ICD-10-CM

## 2019-09-07 DIAGNOSIS — I10 Essential (primary) hypertension: Secondary | ICD-10-CM | POA: Diagnosis not present

## 2019-09-07 DIAGNOSIS — E785 Hyperlipidemia, unspecified: Secondary | ICD-10-CM

## 2019-09-07 DIAGNOSIS — M545 Low back pain, unspecified: Secondary | ICD-10-CM

## 2019-09-07 DIAGNOSIS — Z79899 Other long term (current) drug therapy: Secondary | ICD-10-CM | POA: Diagnosis not present

## 2019-09-07 DIAGNOSIS — N182 Chronic kidney disease, stage 2 (mild): Secondary | ICD-10-CM

## 2019-09-07 DIAGNOSIS — E559 Vitamin D deficiency, unspecified: Secondary | ICD-10-CM

## 2019-09-07 DIAGNOSIS — E1122 Type 2 diabetes mellitus with diabetic chronic kidney disease: Secondary | ICD-10-CM

## 2019-09-07 DIAGNOSIS — E1169 Type 2 diabetes mellitus with other specified complication: Secondary | ICD-10-CM

## 2019-09-07 MED ORDER — CYCLOBENZAPRINE HCL 10 MG PO TABS: ORAL_TABLET | ORAL | 0 refills | Status: DC

## 2019-09-07 NOTE — Patient Instructions (Signed)

## 2019-09-08 LAB — COMPLETE METABOLIC PANEL WITH GFR
AG Ratio: 1.5 (calc) (ref 1.0–2.5)
ALT: 13 U/L (ref 9–46)
AST: 12 U/L (ref 10–35)
Albumin: 4.1 g/dL (ref 3.6–5.1)
Alkaline phosphatase (APISO): 62 U/L (ref 35–144)
BUN: 20 mg/dL (ref 7–25)
CO2: 30 mmol/L (ref 20–32)
Calcium: 10.2 mg/dL (ref 8.6–10.3)
Chloride: 100 mmol/L (ref 98–110)
Creat: 0.99 mg/dL (ref 0.70–1.18)
GFR, Est African American: 88 mL/min/{1.73_m2} (ref >=60)
GFR, Est Non African American: 76 mL/min/{1.73_m2} (ref >=60)
Globulin: 2.7 g/dL (calc) (ref 1.9–3.7)
Glucose, Bld: 345 mg/dL — ABNORMAL HIGH (ref 65–99)
Potassium: 5.3 mmol/L (ref 3.5–5.3)
Sodium: 136 mmol/L (ref 135–146)
Total Bilirubin: 0.6 mg/dL (ref 0.2–1.2)
Total Protein: 6.8 g/dL (ref 6.1–8.1)

## 2019-09-08 LAB — CBC WITH DIFFERENTIAL/PLATELET
Absolute Monocytes: 647 cells/uL (ref 200–950)
Basophils Absolute: 61 cells/uL (ref 0–200)
Basophils Relative: 1 %
Eosinophils Absolute: 494 cells/uL (ref 15–500)
Eosinophils Relative: 8.1 %
HCT: 42.2 % (ref 38.5–50.0)
Hemoglobin: 12.5 g/dL — ABNORMAL LOW (ref 13.2–17.1)
Lymphs Abs: 1409 cells/uL (ref 850–3900)
MCH: 20.8 pg — ABNORMAL LOW (ref 27.0–33.0)
MCHC: 29.6 g/dL — ABNORMAL LOW (ref 32.0–36.0)
MCV: 70.2 fL — ABNORMAL LOW (ref 80.0–100.0)
MPV: 12.3 fL (ref 7.5–12.5)
Monocytes Relative: 10.6 %
Neutro Abs: 3489 cells/uL (ref 1500–7800)
Neutrophils Relative %: 57.2 %
Platelets: 309 10*3/uL (ref 140–400)
RBC: 6.01 10*6/uL — ABNORMAL HIGH (ref 4.20–5.80)
RDW: 17.3 % — ABNORMAL HIGH (ref 11.0–15.0)
Total Lymphocyte: 23.1 %
WBC: 6.1 10*3/uL (ref 3.8–10.8)

## 2019-09-08 LAB — HEMOGLOBIN A1C
Hgb A1c MFr Bld: 8.8 % of total Hgb — ABNORMAL HIGH (ref <5.7)
Mean Plasma Glucose: 206 (calc)
eAG (mmol/L): 11.4 (calc)

## 2019-09-08 LAB — LIPID PANEL
Cholesterol: 158 mg/dL (ref <200)
HDL: 42 mg/dL (ref >=40)
LDL Cholesterol (Calc): 89 mg/dL (calc)
Non-HDL Cholesterol (Calc): 116 mg/dL (calc) (ref <130)
Total CHOL/HDL Ratio: 3.8 (calc) (ref <5.0)
Triglycerides: 168 mg/dL — ABNORMAL HIGH (ref <150)

## 2019-09-08 LAB — TSH: TSH: 2.72 mIU/L (ref 0.40–4.50)

## 2019-09-08 LAB — MAGNESIUM: Magnesium: 1.8 mg/dL (ref 1.5–2.5)

## 2019-09-08 LAB — INSULIN, RANDOM: Insulin: 27.8 u[IU]/mL — ABNORMAL HIGH

## 2019-09-08 LAB — VITAMIN D 25 HYDROXY (VIT D DEFICIENCY, FRACTURES): Vit D, 25-Hydroxy: 38 ng/mL (ref 30–100)

## 2019-09-08 NOTE — Progress Notes (Signed)
=========================================================  -  Glucose = 345 mg% - Very very high !   - A1c - better - down from 10.3%    to now 8.8% (average glucose 206 mg%),  - But still too high (Ideal or goal is less than 6.0%)  - So, you have to work harder with diet !   - Being diabetic has a  300% increased risk for heart attack,  stroke, cancer, and alzheimer- type vascular dementia.   I- t is very important that you work harder with diet by  avoiding all foods that are white except chicken,   fish & calliflower.  - Avoid white rice  (brown & wild rice is OK),   - Avoid white potatoes  (sweet potatoes in moderation is OK),   White bread or wheat bread or anything made out of   white flour like bagels, donuts, rolls, buns, biscuits, cakes,  - pastries, cookies, pizza crust, and pasta (made from  white flour & egg whites)   - vegetarian pasta or spinach or wheat pasta is OK.  - Multigrain breads like Arnold's, Pepperidge Farm or   multigrain sandwich thins or high fiber breads like   [place] bread or "Dave's Killer" breads that are  4 to 5 grams fiber per slice !  are best.    Diet, exercise and weight loss can reverse and cure  diabetes in the early stages.    - Diet, exercise and weight loss is very important in the   control and prevention of complications of diabetes which  affects every system in your body, ie.   -Brain - dementia/stroke,  - eyes - glaucoma/blindness,  - heart - heart attack/heart failure,  - kidneys - dialysis,  - stomach - gastric paralysis,  - intestines - malabsorption,  - nerves - severe painful neuritis,  - circulation - gangrene & loss of a leg(s)  - and finally  . . . . . . . . . . . . . . . . . .    - cancer and Alzheimers.  =========================================================  -  Vitamin D = 38 - also too low   - Vitamin D goal is between 70-100.   - Please INCREASE your Vitamin D to 5,000 units EVERY day !    - It is very important as a natural anti-inflammatory and helping the  immune system protect against viral infections, like the Covid-19    helping hair, skin, and nails, as well as reducing stroke and heart attack risk.   - It helps your bones and helps with mood.  - It also decreases numerous cancer risks so please take it as directed.   - Low Vit D is associated with a 200-300% higher risk for CANCER   and 200-300% higher risk for HEART   ATTACK  &  STROKE.    - It is also associated with higher death rate at younger ages,   autoimmune diseases like Rheumatoid arthritis, Lupus, Multiple Sclerosis.     - Also many other serious conditions, like depression, Alzheimer's  Dementia, infertility, muscle aches, fatigue, fibromyalgia - just to name a few.  ===================================================  -  Total Chol =  158    and LDL Chol =  89    - Both very good  - Very low risk for Heart Attack  / Stroke ==================================================  -  All Else - CBC - Kidneys - Electrolytes -  Liver - Magnesium & Thyroid  - all  Normal /  OK ========================================================

## 2019-10-15 ENCOUNTER — Other Ambulatory Visit: Payer: Self-pay | Admitting: Internal Medicine

## 2019-10-15 DIAGNOSIS — M545 Low back pain, unspecified: Secondary | ICD-10-CM

## 2019-10-19 ENCOUNTER — Ambulatory Visit (INDEPENDENT_AMBULATORY_CARE_PROVIDER_SITE_OTHER): Payer: Medicare Other | Admitting: Adult Health

## 2019-10-19 ENCOUNTER — Encounter: Payer: Self-pay | Admitting: Adult Health

## 2019-10-19 ENCOUNTER — Other Ambulatory Visit: Payer: Self-pay

## 2019-10-19 VITALS — BP 112/66 | HR 79 | Temp 97.0°F | Wt 135.0 lb

## 2019-10-19 DIAGNOSIS — E1122 Type 2 diabetes mellitus with diabetic chronic kidney disease: Secondary | ICD-10-CM

## 2019-10-19 DIAGNOSIS — N182 Chronic kidney disease, stage 2 (mild): Secondary | ICD-10-CM

## 2019-10-19 DIAGNOSIS — S39012D Strain of muscle, fascia and tendon of lower back, subsequent encounter: Secondary | ICD-10-CM

## 2019-10-19 NOTE — Progress Notes (Signed)
Assessment and Plan:  William Haney was seen today for back pain.  Diagnoses and all orders for this visit:  Strain of lumbar paraspinous muscle, subsequent encounter - negative straight leg Prednisone was not prescribed, tylenol, RICE, and exercise given Natural history and expected course discussed. Questions answered. Neurosurgeon distributed. Proper lifting, bending technique discussed. Stretching exercises discussed. Heat to affected area as needed for local pain relief. Muscle relaxants per medication orders. - flexeril 10 mg TID PRN If not better follow up in office or will refer to PT/orthopedics.  CKD stage 2 due to type 2 diabetes mellitus ([place]) Reviewed his current meds, HPI per below, recent labs with patient, and his risk factors; he is on ARB for DM renal protection, also on jardiance My suspicion for any renal etiology is very low; he is reassures by this but wishes to proceed with checking labs for this -     COMPLETE METABOLIC PANEL WITH GFR -     Urinalysis, Routine w reflex microscopic   Further disposition pending results of labs. Discussed med's effects and SE's.   Over 30 minutes of exam, counseling, chart review, and critical decision making was performed.   Future Appointments  Date Time Provider [place]  12/10/2019  9:30 AM [name], PA-C [facility] None  06/05/2020 11:00 AM [name], PA-C [facility] None    ------------------------------------------------------------------------------------------------------------------   HPI BP 112/66   Pulse 79   Temp (!) 97 F (36.1 C)   Wt 135 lb (61.2 kg)   SpO2 99%   BMI 25.51 kg/m   69 y.o.male presents requesting kidney functions check; he reports has had lower back pain x 1 month, just wants to make sure his kidneys are ok.   He reports pain began approx 1 month ago, denies hx of back problems, denies injury, fall prior to onset. Gradual onset, reports intermittent bilateral  lumbar pain, non midline, non-radiating. Worst in the morning, very stiff, has aching pain to sides, at worst up to 7/10. He reports improves with his back stretches and exercises, less when he is up and moving, worse after sitting still for extended periods. He was given flexeril at last OV but admits hasn't been taking. Has taken some tylenol with benefit. Denies distal extremity pain, numbness, tingling, loss of bladder/bowel control.   Family hisotry of renal failure and very concerned about making sure there are no problems. He also reports remote personal hx of renal stone in his teens without recurrence. Denies abdominal pain, n/v, fever/chills, hematuria, frequency, urgency, changes in urine character.    Lab Results  Component Value Date   GFRNONAA 76 09/07/2019    Past Medical History:  Diagnosis Date  . Anemia   . Carotid artery stenosis 01/2017   L-ICA 1-39%, R-ICA 40-59% doppler  . Coronary artery disease    s/p CABG in 2008, w/ LIMA-LAD, SVG-Diag, SVG-OM, SVG-PL  . Diabetes mellitus type II   . Hyperlipidemia   . Hypertension   . Vitamin D deficiency      Allergies  Allergen Reactions  . Ace Inhibitors Cough  . Statins Other (See Comments)    Elevated LFT's    Current Outpatient Medications on File Prior to Visit  Medication Sig  . aspirin EC 81 MG tablet Take 81 mg by mouth daily. Swallow whole.  . blood glucose meter kit and supplies Test sugars once to twice daily  . Cholecalciferol (VITAMIN D3) 125 MCG (5000 UT) CAPS Take 5,000 Units by mouth 4 (four) times  a week.   . Cinnamon 500 MG capsule Take 2 capsules (1,000 mg total) by mouth 2 (two) times daily. (Patient taking differently: Take 500 mg by mouth daily. )  . cyclobenzaprine (FLEXERIL) 10 MG tablet TAKE 1/2 TO 1 TABLET 3 X /DAY AS NEEDED FOR MUSCLE SPASM  . empagliflozin (JARDIANCE) 10 MG TABS tablet Take 10 mg by mouth daily.   [name] glimepiride (AMARYL) 4 MG tablet TAKE 1 TABLET BY MOUTH TWICE DAILY WITH  MEALS FOR DIABETES  . metFORMIN (GLUCOPHAGE) 500 MG tablet Take 2 tablets 2 x /day with Meals for Diabetes  . metoprolol tartrate (LOPRESSOR) 25 MG tablet Take 1 tablet 2 x /day for BP  . Multiple Vitamin (MULTIVITAMIN) tablet Take 1 tablet by mouth daily.  . rosuvastatin (CRESTOR) 20 MG tablet TAKE 1 TABLET BY MOUTH EVERY DAY FOR CHOLESTEROL  . telmisartan (MICARDIS) 20 MG tablet TAKE 1 TABLET DAILY FOR BP & KIDNEY PROTECTION   No current facility-administered medications on file prior to visit.    ROS: all negative except above.   Physical Exam:  BP 112/66   Pulse 79   Temp (!) 97 F (36.1 C)   Wt 135 lb (61.2 kg)   SpO2 99%   BMI 25.51 kg/m   General Appearance: Well nourished, in no apparent distress. Eyes: PERRLA, conjunctiva no swelling or erythema ENT/Mouth: Mask in place; Hearing normal.  Neck: Supple Respiratory: Respiratory effort normal, BS equal bilaterally without rales, rhonchi, wheezing or stridor.  Cardio: RRR with no MRGs. Brisk peripheral pulses without edema.  Abdomen: Soft, + BS.  Non tender, no guarding, rebound, hernias, masses. Lymphatics: Non tender without lymphadenopathy.  Musculoskeletal: Full ROM, 5/5 strength, normal gait. No midline tenderness, has bilateral lumbar muscular tenderness. Negative straight leg raise.  Skin: Warm, dry without rashes, lesions, ecchymosis.  Neuro: Normal muscle tone, Sensation intact.  Psych: Awake and oriented X 3, normal affect, Insight and Judgment appropriate.     Izora Ribas, NP 10:28 AM [place] [facility]

## 2019-10-19 NOTE — Patient Instructions (Signed)
Try heat, massage, icy hot  Muscle relaxer in the evening   Low Back Sprain or Strain Rehab Ask your health care provider which exercises are safe for you. Do exercises exactly as told by your health care provider and adjust them as directed. It is normal to feel mild stretching, pulling, tightness, or discomfort as you do these exercises. Stop right away if you feel sudden pain or your pain gets worse. Do not begin these exercises until told by your health care provider. Stretching and range-of-motion exercises These exercises warm up your muscles and joints and improve the movement and flexibility of your back. These exercises also help to relieve pain, numbness, and tingling. Lumbar rotation  1. Lie on your back on a firm surface and bend your knees. 2. Straighten your arms out to your sides so each arm forms a 90-degree angle (right angle) with a side of your body. 3. Slowly move (rotate) both of your knees to one side of your body until you feel a stretch in your lower back (lumbar). Try not to let your shoulders lift off the floor. 4. Hold this position for __________ seconds. 5. Tense your abdominal muscles and slowly move your knees back to the starting position. 6. Repeat this exercise on the other side of your body. Repeat __________ times. Complete this exercise __________ times a day. Single knee to chest  1. Lie on your back on a firm surface with both legs straight. 2. Bend one of your knees. Use your hands to move your knee up toward your chest until you feel a gentle stretch in your lower back and buttock. ? Hold your leg in this position by holding on to the front of your knee. ? Keep your other leg as straight as possible. 3. Hold this position for __________ seconds. 4. Slowly return to the starting position. 5. Repeat with your other leg. Repeat __________ times. Complete this exercise __________ times a day. Prone extension on elbows  1. Lie on your abdomen on a  firm surface (prone position). 2. Prop yourself up on your elbows. 3. Use your arms to help lift your chest up until you feel a gentle stretch in your abdomen and your lower back. ? This will place some of your body weight on your elbows. If this is uncomfortable, try stacking pillows under your chest. ? Your hips should stay down, against the surface that you are lying on. Keep your hip and back muscles relaxed. 4. Hold this position for __________ seconds. 5. Slowly relax your upper body and return to the starting position. Repeat __________ times. Complete this exercise __________ times a day. Strengthening exercises These exercises build strength and endurance in your back. Endurance is the ability to use your muscles for a long time, even after they get tired. Pelvic tilt This exercise strengthens the muscles that lie deep in the abdomen. 1. Lie on your back on a firm surface. Bend your knees and keep your feet flat on the floor. 2. Tense your abdominal muscles. Tip your pelvis up toward the ceiling and flatten your lower back into the floor. ? To help with this exercise, you may place a small towel under your lower back and try to push your back into the towel. 3. Hold this position for __________ seconds. 4. Let your muscles relax completely before you repeat this exercise. Repeat __________ times. Complete this exercise __________ times a day. Alternating arm and leg raises  1. Get on your hands  and knees on a firm surface. If you are on a hard floor, you may want to use padding, such as an exercise mat, to cushion your knees. 2. Line up your arms and legs. Your hands should be directly below your shoulders, and your knees should be directly below your hips. 3. Lift your left leg behind you. At the same time, raise your right arm and straighten it in front of you. ? Do not lift your leg higher than your hip. ? Do not lift your arm higher than your shoulder. ? Keep your abdominal and  back muscles tight. ? Keep your hips facing the ground. ? Do not arch your back. ? Keep your balance carefully, and do not hold your breath. 4. Hold this position for __________ seconds. 5. Slowly return to the starting position. 6. Repeat with your right leg and your left arm. Repeat __________ times. Complete this exercise __________ times a day. Abdominal set with straight leg raise  1. Lie on your back on a firm surface. 2. Bend one of your knees and keep your other leg straight. 3. Tense your abdominal muscles and lift your straight leg up, 4-6 inches (10-15 cm) off the ground. 4. Keep your abdominal muscles tight and hold this position for __________ seconds. ? Do not hold your breath. ? Do not arch your back. Keep it flat against the ground. 5. Keep your abdominal muscles tense as you slowly lower your leg back to the starting position. 6. Repeat with your other leg. Repeat __________ times. Complete this exercise __________ times a day. Single leg lower with bent knees 1. Lie on your back on a firm surface. 2. Tense your abdominal muscles and lift your feet off the floor, one foot at a time, so your knees and hips are bent in 90-degree angles (right angles). ? Your knees should be over your hips and your lower legs should be parallel to the floor. 3. Keeping your abdominal muscles tense and your knee bent, slowly lower one of your legs so your toe touches the ground. 4. Lift your leg back up to return to the starting position. ? Do not hold your breath. ? Do not let your back arch. Keep your back flat against the ground. 5. Repeat with your other leg. Repeat __________ times. Complete this exercise __________ times a day. Posture and body mechanics Good posture and healthy body mechanics can help to relieve stress in your body's tissues and joints. Body mechanics refers to the movements and positions of your body while you do your daily activities. Posture is part of body  mechanics. Good posture means:  Your spine is in its natural S-curve position (neutral).  Your shoulders are pulled back slightly.  Your head is not tipped forward. Follow these guidelines to improve your posture and body mechanics in your everyday activities. Standing   When standing, keep your spine neutral and your feet about hip width apart. Keep a slight bend in your knees. Your ears, shoulders, and hips should line up.  When you do a task in which you stand in one place for a long time, place one foot up on a stable object that is 2-4 inches (5-10 cm) high, such as a footstool. This helps keep your spine neutral. Sitting   When sitting, keep your spine neutral and keep your feet flat on the floor. Use a footrest, if necessary, and keep your thighs parallel to the floor. Avoid rounding your shoulders, and avoid tilting your  head forward.  When working at a desk or a computer, keep your desk at a height where your hands are slightly lower than your elbows. Slide your chair under your desk so you are close enough to maintain good posture.  When working at a computer, place your monitor at a height where you are looking straight ahead and you do not have to tilt your head forward or downward to look at the screen. Resting  When lying down and resting, avoid positions that are most painful for you.  If you have pain with activities such as sitting, bending, stooping, or squatting, lie in a position in which your body does not bend very much. For example, avoid curling up on your side with your arms and knees near your chest (fetal position).  If you have pain with activities such as standing for a long time or reaching with your arms, lie with your spine in a neutral position and bend your knees slightly. Try the following positions: ? Lying on your side with a pillow between your knees. ? Lying on your back with a pillow under your knees. Lifting   When lifting objects, keep your  feet at least shoulder width apart and tighten your abdominal muscles.  Bend your knees and hips and keep your spine neutral. It is important to lift using the strength of your legs, not your back. Do not lock your knees straight out.  Always ask for help to lift heavy or awkward objects. This information is not intended to replace advice given to you by your health care provider. Make sure you discuss any questions you have with your health care provider. Document Revised: 06/19/2018 Document Reviewed: 03/19/2018 [employer] Patient Education  [place].

## 2019-10-20 LAB — URINALYSIS, ROUTINE W REFLEX MICROSCOPIC
Bilirubin Urine: NEGATIVE
Hgb urine dipstick: NEGATIVE
Ketones, ur: NEGATIVE
Leukocytes,Ua: NEGATIVE
Nitrite: NEGATIVE
Protein, ur: NEGATIVE
Specific Gravity, Urine: 1.014 (ref 1.001–1.03)
pH: 6 (ref 5.0–8.0)

## 2019-10-20 LAB — COMPLETE METABOLIC PANEL WITH GFR
AG Ratio: 1.8 (calc) (ref 1.0–2.5)
ALT: 13 U/L (ref 9–46)
AST: 13 U/L (ref 10–35)
Albumin: 4 g/dL (ref 3.6–5.1)
Alkaline phosphatase (APISO): 50 U/L (ref 35–144)
BUN: 15 mg/dL (ref 7–25)
CO2: 29 mmol/L (ref 20–32)
Calcium: 9.2 mg/dL (ref 8.6–10.3)
Chloride: 100 mmol/L (ref 98–110)
Creat: 1.04 mg/dL (ref 0.70–1.18)
GFR, Est African American: 83 mL/min/{1.73_m2} (ref >=60)
GFR, Est Non African American: 72 mL/min/{1.73_m2} (ref >=60)
Globulin: 2.2 g/dL (calc) (ref 1.9–3.7)
Glucose, Bld: 150 mg/dL — ABNORMAL HIGH (ref 65–99)
Potassium: 5.4 mmol/L — ABNORMAL HIGH (ref 3.5–5.3)
Sodium: 133 mmol/L — ABNORMAL LOW (ref 135–146)
Total Bilirubin: 0.5 mg/dL (ref 0.2–1.2)
Total Protein: 6.2 g/dL (ref 6.1–8.1)

## 2019-11-22 ENCOUNTER — Ambulatory Visit: Payer: Medicare Other | Admitting: Physician Assistant

## 2019-12-08 NOTE — Progress Notes (Signed)
MEDICARE WELLNESS AND FOLLOW UP Assessment:    Essential hypertension - continue medications, DASH diet, exercise and monitor at home. Call if greater than 130/80.  -     CBC with Differential/Platelet -     CMP/GFR -     TSH  Atherosclerosis of native coronary artery of native heart without angina pectoris Control blood pressure, cholesterol, glucose, increase exercise.  -     Lipid panel -     Hemoglobin A1c  Stenosis of carotid artery, unspecified laterality Control blood pressure, cholesterol, glucose, increase exercise.  Followed by cardiology; moderate disease -     Lipid panel -     Hemoglobin A1c  Type 2 diabetes mellitus with other circulatory complication, without long-term current use of insulin (HCC) Off of insulin On metformin, glimepiride, on jardiance Discussed general issues about diabetes pathophysiology and management., Educational material distributed., Suggested low cholesterol diet., Encouraged aerobic exercise., Discussed foot care., Reminded to get yearly retinal exam. -     Hemoglobin A1c  Diabetic polyneuropathy associated with type 2 diabetes mellitus (HCC) Intermittent burning in bilateral feet, none recent, declines medications Control sugars, check feet daily -     Hemoglobin A1c  Type 2 diabetes mellitus with stage 2 chronic kidney disease, without long-term current use of insulin ([place]) Discussed general issues about diabetes pathophysiology and management., Educational material distributed., Suggested low cholesterol diet., Encouraged aerobic exercise., Discussed foot care., Reminded to get yearly retinal exam- report for this year in system -     Hemoglobin A1c  Type 2 diabetes mellitus with diabetic retinopathy Control sugars, followed by Ophthalmology  Hyperlipidemia -continue medications, check lipids, decrease fatty foods, increase activity.  -     Lipid panel  Anemia, beta thalassemia minor trait -     CBC with  Differential/Platelet  Medication management -     Magnesium  Vitamin D deficiency Continue vitamin D supplement  Former smoker, 34 pack year history, quit 2007 -CXR 12/2018- will repeat in 1 year  Future Appointments  Date Time Provider [place]  06/05/2020 11:00 AM Garnet Sierras, NP [facility] None     Medicare Attestation I have personally reviewed: The patient's medical and social history Their use of alcohol, tobacco or illicit drugs Their current medications and supplements The patient's functional ability including ADLs,fall risks, home safety risks, cognitive, and hearing and visual impairment Diet and physical activities Evidence for depression or mood disorders  The patient's weight, height, BMI, and visual acuity have been recorded in the chart.  I have made referrals, counseling, and provided education to the patient based on review of the above and I have provided the patient with a written personalized care plan for preventive services.    MEDICARE WELLNESS OBJECTIVES: Physical activity: Current Exercise Habits: The patient does not participate in regular exercise at present Cardiac risk factors: Cardiac Risk Factors include: advanced age (>55mn, >>31women);diabetes mellitus;dyslipidemia;hypertension;male gender;sedentary lifestyle Depression/mood screen:   [facility] 12/10/2019  Decreased Interest 0  Down, Depressed, Hopeless 0  PHQ - 2 Score 0    ADLs:  In your present state of health, do you have any difficulty performing the following activities: 12/10/2019 09/07/2019  Hearing? N N  Vision? N N  Difficulty concentrating or making decisions? N N  Walking or climbing stairs? N N  Dressing or bathing? N N  Doing errands, shopping? N N  Some recent data might be hidden     Cognitive Testing  Alert? Yes  Normal Appearance?Yes  Oriented to person? Yes  Place? Yes   Time? Yes  Recall of three objects?  Yes  Can perform simple  calculations? Yes  Displays appropriate judgment?Yes  Can read the correct time from a watch face?Yes  EOL planning: Does Patient Have a Medical Advance Directive?: Yes Type of Advance Directive: Healthcare Power of Attorney, Living will [place] in Chart?: No - copy requested   Screening Tests Immunization History  Administered Date(s) Administered  . DT (Pediatric) 02/07/2015  . Influenza Whole 12/10/2012  . Influenza, High Dose Seasonal PF 02/07/2015, 01/02/2016, 12/13/2016, 01/01/2018, 11/19/2018  . Influenza-Unspecified 12/31/2013  . Meningococcal Conjugate 09/03/2016  . PFIZER SARS-COV-2 Vaccination 03/23/2019, 04/23/2019  . Pneumococcal Conjugate-13 02/02/2014  . Pneumococcal Polysaccharide-23 04/13/2003, 10/08/2016  . Td 04/18/2004   Health Maintenance  Topic Date Due  . INFLUENZA VACCINE  10/10/2019  . FOOT EXAM  11/19/2019  . HEMOGLOBIN A1C  03/08/2020  . OPHTHALMOLOGY EXAM  12/09/2020  . COLONOSCOPY  08/22/2022  . TETANUS/TDAP  02/06/2025  . COVID-19 Vaccine  Completed  . Hepatitis C Screening  Completed  . PNA vac Low Risk Adult  Completed   DEXA: declines Colonoscopy: 2014 due 2024  CXr 12/2018 has 34 pack year smoking hx, quit in 2007   EGD:N/A Echo 01/2008 Stress test 2013 MRI head 03/2007 Carotid US- 08/14/2018 - by Dr. Percival Spanish   Names of Other Physician/Practitioners you currently use: 1. [facility] here for primary care 2. [facility], Dr. Clydene Laming, eye doctor, last visit 06/2018 3. Dr. Burt Knack, dentist, last visit several years ago - has full dentures  Patient Care Team: Unk Pinto, MD as PCP - General (Internal Medicine) Minus Breeding, MD as PCP - Cardiology (Cardiology) Keene Breath., MD as Consulting Physician (Ophthalmology) Minus Breeding, MD as Consulting Physician (Cardiology) Clarene Essex, MD as Consulting Physician (Gastroenterology) Dorna Leitz, MD as Consulting  Physician (Orthopedic Surgery)   Subjective:  William Haney is a 69 y.o. male who presents for medicare wellness and 3 month follow up for HTN, hyperlipidemia, diabetes, and vitamin D Def.    S/p left knee replacement with Dr. Berenice Primas, now have right knee pain and would like to have that knee replaced after he gets his kids taken care of with marriage's, daughter graduating doctorate of PT    BMI is Body mass index is 25.16 kg/m., he has been working on diet and exercise. Gets on stationary cycle 20-25 min daily.  Wt Readings from Last 3 Encounters:  12/10/19 131 lb (59.4 kg)  10/19/19 135 lb (61.2 kg)  09/07/19 133 lb 6.4 oz (60.5 kg)   He has a history of CAD s/p CABG in 2008, follows with Dr. Percival Spanish. He states he has had low energy for 1-2 months, he wakes up and feels well rested.  Lab Results  Component Value Date   IRON 87 05/27/2018   TIBC 360 05/27/2018   FERRITIN 38 02/26/2018   Lab Results  Component Value Date   [id number] 619 05/27/2018    Also follows for moderate bilateral carotid stenosis getting annual carotid US He has a history of smoking, Q 2008, 35 pack year, last CXR 12/2018 before surgery.  His blood pressure has been controlled at home (120/80), today their BP is BP: 126/74 He does workout.  He denies chest pain, shortness of breath, dizziness.   He has been working on diet and exercise for diabetes  with history of CAD/PAD ,he is on 17m ASA  With hyperlipidemia on crestor 20 at goal less than 70 With CKD he is on micardis Retinopathy - 06/2018 + retinopathy HE IS DUE he is on metformin 2000 mg daily glimepiride 4 mg only on 1 a day  jardiance 10 mg daily He does check fasting glucose, has been better and running 120-130 or so. [name] reported parasthesias in the past but none recently  Denies foot ulcerations, hyperglycemia, polydipsia, polyuria and visual disturbances.  Last A1C in the office was:  Lab Results  Component Value Date   HGBA1C 8.8 (H)  09/07/2019   Lab Results  Component Value Date   GFRNONAA 72 10/19/2019   Patient is on Vitamin D supplement.   Lab Results  Component Value Date   VD25OH 38 09/07/2019        Medication Review:  Current Outpatient Medications (Endocrine & Metabolic):  .  empagliflozin (JARDIANCE) 10 MG TABS tablet, Take 10 mg by mouth daily.  [name]  glimepiride (AMARYL) 4 MG tablet, TAKE 1 TABLET BY MOUTH TWICE DAILY WITH MEALS FOR DIABETES .  metFORMIN (GLUCOPHAGE) 500 MG tablet, Take 2 tablets 2 x /day with Meals for Diabetes  Current Outpatient Medications (Cardiovascular):  .  metoprolol tartrate (LOPRESSOR) 25 MG tablet, Take 1 tablet 2 x /day for BP .  rosuvastatin (CRESTOR) 20 MG tablet, TAKE 1 TABLET BY MOUTH EVERY DAY FOR CHOLESTEROL .  telmisartan (MICARDIS) 20 MG tablet, TAKE 1 TABLET DAILY FOR BP & KIDNEY PROTECTION   Current Outpatient Medications (Analgesics):  .  aspirin EC 81 MG tablet, Take 81 mg by mouth daily. Swallow whole.   Current Outpatient Medications (Other):  .  blood glucose meter kit and supplies, Test sugars once to twice daily .  Cholecalciferol (VITAMIN D3) 125 MCG (5000 UT) CAPS, Take 5,000 Units by mouth 4 (four) times a week.  .  Cinnamon 500 MG capsule, Take 2 capsules (1,000 mg total) by mouth 2 (two) times daily. (Patient taking differently: Take 500 mg by mouth daily. ) .  cyclobenzaprine (FLEXERIL) 10 MG tablet, TAKE 1/2 TO 1 TABLET 3 X /DAY AS NEEDED FOR MUSCLE SPASM .  Multiple Vitamin (MULTIVITAMIN) tablet, Take 1 tablet by mouth daily.  Current Problems (verified) Patient Active Problem List   Diagnosis Date Noted  . Aorto-iliac atherosclerosis ([place]) 05/26/2019  . Primary osteoarthritis of left knee 12/18/2018  . beta thalassemia minor (trait) anemia 03/08/2018  . Overweight (BMI 25.0-29.9) 08/10/2017  . Diabetic retinopathy of both eyes ([place]) 08/10/2017  . Poor compliance 09/09/2015  . DM type 2 (diabetes mellitus, type 2) ([place]) 09/08/2015  .  Neuropathy, diabetic ([place]) 05/19/2015  . Encounter for Medicare annual wellness exam 09/21/2014  . Essential hypertension 10/11/2013  . Vitamin D deficiency 10/11/2013  . Medication management 10/11/2013  . Hyperlipidemia associated with type 2 diabetes mellitus ([place]) 03/01/2010  . Coronary atherosclerosis 03/01/2010  . Carotid stenosis 03/01/2010   Allergies Allergies  Allergen Reactions  . Ace Inhibitors Cough  . Statins Other (See Comments)    Elevated LFT's    SURGICAL HISTORY He  has a past surgical history that includes Coronary artery bypass graft (2008) and Total knee arthroplasty (Left, 12/18/2018). FAMILY HISTORY His family history includes Diabetes in his sister; Heart attack in his father; Heart disease in his brother and father; Hyperlipidemia in his father; Hypertension in his father. SOCIAL HISTORY He  reports that he quit smoking about 14 years ago. He started smoking about 48 years ago. He has a 34.00 pack-year smoking history.  He has never used smokeless tobacco. He reports that he does not drink alcohol and does not use drugs.   Objective:   Blood pressure 126/74, pulse 82, temperature 97.7 F (36.5 C), height 5' 0.5" (1.537 m), weight 131 lb (59.4 kg), SpO2 98 %. Body mass index is 25.16 kg/m.  General appearance: alert, no distress, WD/WN, male HEENT: normocephalic, sclerae anicteric, TMs pearly, nares patent, no discharge or erythema, pharynx normal Oral cavity: MMM, no lesions Neck: supple, no lymphadenopathy, no thyromegaly, no masses Heart: RRR, normal S1, S2, no murmurs Lungs: CTA bilaterally, no wheezes, rhonchi, or rales Abdomen: +bs, soft, non tender, non distended, no masses, no hepatomegaly, no splenomegaly Musculoskeletal: nontender, no swelling, varus deformity, antalgic gait, well healing vertical left knee scar , + crepitus right knee.  Extremities: no edema, no cyanosis, no clubbing Pulses: 2+ symmetric, upper and lower extremities, normal  cap refill Neurological: alert, oriented x 3, CN2-12 intact, strength normal upper extremities and lower extremities, sensation abnormal bilateral feet, DTRs 2+ throughout, no cerebellar signs Psychiatric: normal affect, behavior normal, pleasant     [name], PA-C   12/10/2019

## 2019-12-10 ENCOUNTER — Ambulatory Visit (INDEPENDENT_AMBULATORY_CARE_PROVIDER_SITE_OTHER): Payer: Medicare Other | Admitting: Physician Assistant

## 2019-12-10 ENCOUNTER — Encounter: Payer: Self-pay | Admitting: Physician Assistant

## 2019-12-10 ENCOUNTER — Other Ambulatory Visit: Payer: Self-pay

## 2019-12-10 VITALS — BP 126/74 | HR 82 | Temp 97.7°F | Ht 60.5 in | Wt 131.0 lb

## 2019-12-10 DIAGNOSIS — I10 Essential (primary) hypertension: Secondary | ICD-10-CM

## 2019-12-10 DIAGNOSIS — Z0000 Encounter for general adult medical examination without abnormal findings: Secondary | ICD-10-CM

## 2019-12-10 DIAGNOSIS — E1142 Type 2 diabetes mellitus with diabetic polyneuropathy: Secondary | ICD-10-CM | POA: Diagnosis not present

## 2019-12-10 DIAGNOSIS — E663 Overweight: Secondary | ICD-10-CM

## 2019-12-10 DIAGNOSIS — I2510 Atherosclerotic heart disease of native coronary artery without angina pectoris: Secondary | ICD-10-CM

## 2019-12-10 DIAGNOSIS — D509 Iron deficiency anemia, unspecified: Secondary | ICD-10-CM

## 2019-12-10 DIAGNOSIS — E113293 Type 2 diabetes mellitus with mild nonproliferative diabetic retinopathy without macular edema, bilateral: Secondary | ICD-10-CM | POA: Diagnosis not present

## 2019-12-10 DIAGNOSIS — E559 Vitamin D deficiency, unspecified: Secondary | ICD-10-CM

## 2019-12-10 DIAGNOSIS — E785 Hyperlipidemia, unspecified: Secondary | ICD-10-CM

## 2019-12-10 DIAGNOSIS — Z79899 Other long term (current) drug therapy: Secondary | ICD-10-CM

## 2019-12-10 DIAGNOSIS — I700 Atherosclerosis of aorta: Secondary | ICD-10-CM | POA: Diagnosis not present

## 2019-12-10 DIAGNOSIS — E538 Deficiency of other specified B group vitamins: Secondary | ICD-10-CM

## 2019-12-10 DIAGNOSIS — R6889 Other general symptoms and signs: Secondary | ICD-10-CM | POA: Diagnosis not present

## 2019-12-10 DIAGNOSIS — I6523 Occlusion and stenosis of bilateral carotid arteries: Secondary | ICD-10-CM

## 2019-12-10 DIAGNOSIS — Z0001 Encounter for general adult medical examination with abnormal findings: Secondary | ICD-10-CM

## 2019-12-10 DIAGNOSIS — E1169 Type 2 diabetes mellitus with other specified complication: Secondary | ICD-10-CM

## 2019-12-10 DIAGNOSIS — I708 Atherosclerosis of other arteries: Secondary | ICD-10-CM

## 2019-12-10 NOTE — Patient Instructions (Addendum)
GET EYE APPOINTMENT!!   GET ON SUBLINGUAL B12 1000 daily at least or more   Vitamin B12 Deficiency Vitamin B12 deficiency occurs when the body does not have enough vitamin B12, which is an important vitamin. The body needs this vitamin:  To make red blood cells.  To make DNA. This is the genetic material inside cells.  To help the nerves work properly so they can carry messages from the brain to the body. Vitamin B12 deficiency can cause various health problems, such as a low red blood cell count (anemia) or nerve damage. What are the causes? This condition may be caused by:  Not eating enough foods that contain vitamin B12.  Not having enough stomach acid and digestive fluids to properly absorb vitamin B12 from the food that you eat.  Certain digestive system diseases that make it hard to absorb vitamin B12. These diseases include Crohn's disease, chronic pancreatitis, and cystic fibrosis.  A condition in which the body does not make enough of a protein (intrinsic factor), resulting in too few red blood cells (pernicious anemia).  Having a surgery in which part of the stomach or small intestine is removed.  Taking certain medicines that make it hard for the body to absorb vitamin B12. These medicines include: ? Heartburn medicines (antacids and proton pump inhibitors). ? Certain antibiotic medicines. ? Some medicines that are used to treat diabetes, tuberculosis, gout, or high cholesterol. What increases the risk? The following factors may make you more likely to develop a B12 deficiency:  Being older than age 91.  Eating a vegetarian or vegan diet, especially while you are pregnant.  Eating a poor diet while you are pregnant.  Taking certain medicines.  Having alcoholism. What are the signs or symptoms? In some cases, there are no symptoms of this condition. If the condition leads to anemia or nerve damage, various symptoms can occur, such  as:  Weakness.  Fatigue.  Loss of appetite.  Weight loss.  Numbness or tingling in your hands and feet.  Redness and burning of the tongue.  Confusion or memory problems.  Depression.  Sensory problems, such as color blindness, ringing in the ears, or loss of taste.  Diarrhea or constipation.  Trouble walking. If anemia is severe, symptoms can include:  Shortness of breath.  Dizziness.  Rapid heart rate (tachycardia). How is this diagnosed? This condition may be diagnosed with a blood test to measure the level of vitamin B12 in your blood. You may also have other tests, including:  A group of tests that measure certain characteristics of blood cells (complete blood count, CBC).  A blood test to measure intrinsic factor.  A procedure where a thin tube with a camera on the end is used to look into your stomach or intestines (endoscopy). Other tests may be needed to discover the cause of B12 deficiency. How is this treated? Treatment for this condition depends on the cause. This condition may be treated by:  Changing your eating and drinking habits, such as: ? Eating more foods that contain vitamin B12. ? Drinking less alcohol or no alcohol.  Getting vitamin B12 injections.  Taking vitamin B12 supplements. Your health care provider will tell you which dosage is best for you. Follow these instructions at home: Eating and drinking   Eat lots of healthy foods that contain vitamin B12, including: ? Meats and poultry. This includes beef, pork, chicken, [place], and organ meats, such as liver. ? Seafood. This includes clams, rainbow trout, salmon, tuna,  and haddock. ? Eggs. ? Cereal and dairy products that are fortified. This means that vitamin B12 has been added to the food. Check the label on the package to see if the food is fortified. The items listed above may not be a complete list of recommended foods and beverages. Contact a dietitian for more  information. General instructions  Get any injections that are prescribed by your health care provider.  Take supplements only as told by your health care provider. Follow the directions carefully.  Do not drink alcohol if your health care provider tells you not to. In some cases, you may only be asked to limit alcohol use.  Keep all follow-up visits as told by your health care provider. This is important. Contact a health care provider if:  Your symptoms come back. Get help right away if you:  Develop shortness of breath.  Have a rapid heart rate.  Have chest pain.  Become dizzy or lose consciousness. Summary  Vitamin B12 deficiency occurs when the body does not have enough vitamin B12.  The main causes of vitamin B12 deficiency include dietary deficiency, digestive diseases, pernicious anemia, and having a surgery in which part of the stomach or small intestine is removed.  In some cases, there are no symptoms of this condition. If the condition leads to anemia or nerve damage, various symptoms can occur, such as weakness, shortness of breath, and numbness.  Treatment may include getting vitamin B12 injections or taking vitamin B12 supplements. Eat lots of healthy foods that contain vitamin B12. This information is not intended to replace advice given to you by your health care provider. Make sure you discuss any questions you have with your health care provider. Document Revised: 08/14/2018 Document Reviewed: 11/04/2017 Elsevier Patient Education  [place].    Diabetes or even increased sugars put you at 300% increased risk of heart attack and stroke.  ALSO BEING DIABETIC YOU MAY NOT HAVE ANY PAIN WITH A HEART ATTACK.  Even worse of a chance of no pain if you are a woman.  It is very unlikely that you will have any pain with a heart attack. Likely your symptoms will be very subtle, even for very severe disease.  Your symptoms for a heart attack will likely occur  when you exert your self or exercise and include: Shortness of breath Sweating Nausea Dizziness Fast or irregular heart beats Fatigue   It makes me feel better if my diabetics get their heart rate up with exercise once or twice a week and pay close attention to your body. If there is ANY change in your exercise capacity or if you have symptoms above, please STOP and call 911 or call to come to the office.   PLEASE REMEMBER:  Diabetes is preventable! Up to 59 percent of complications and morbidities among individuals with type 2 diabetes can be prevented, delayed, or effectively treated and minimized with regular visits to a health professional, appropriate monitoring and medication, and a healthy diet and lifestyle.   Here is some information to help you keep your heart healthy: Move it! - Aim for 30 mins of activity every day. Take it slowly at first. Talk to [place] before starting any new exercise program.   Lose it.  -Body Mass Index (BMI) can indicate if you need to lose weight. A healthy range is 18.5-24.9. For a BMI calculator, go to [employer]  Waist Management -Excess abdominal fat is a risk factor for heart disease, diabetes, asthma, stroke  and more. Ideal waist circumference is less than 35" for women and less than 40" for men.   Eat Right -focus on fruits, vegetables, whole grains, and meals you make yourself. Avoid foods with trans fat and high sugar/sodium content.   Snooze or Snore? - Loud snoring can be a sign of sleep apnea, a significant risk factor for high blood pressure, heart attach, stroke, and heart arrhythmias.  Kick the habit -Quit Smoking! Avoid second hand smoke. A single cigarette raises your blood pressure for 20 mins and increases the risk of heart attack and stroke for the next 24 hours.   Are Aspirin and Supplements right for you? -Add ENTERIC COATED low dose 81 mg Aspirin daily OR can do every other day if you have easy bruising to protect your heart  and head. As well as to reduce risk of Colon Cancer by 20 %, Skin Cancer by 26 % , Melanoma by 46% and Pancreatic cancer by 60%  Say "No to Stress -There may be little you can do about problems that cause stress. However, techniques such as long walks, meditation, and exercise can help you manage it.   Start Now! - Make changes one at a time and set reasonable goals to increase your likelihood of success.

## 2019-12-11 LAB — CBC WITH DIFFERENTIAL/PLATELET
Absolute Monocytes: 689 cells/uL (ref 200–950)
Basophils Absolute: 71 cells/uL (ref 0–200)
Basophils Relative: 1 %
Eosinophils Absolute: 490 cells/uL (ref 15–500)
Eosinophils Relative: 6.9 %
HCT: 41.7 % (ref 38.5–50.0)
Hemoglobin: 12.4 g/dL — ABNORMAL LOW (ref 13.2–17.1)
Lymphs Abs: 1377 cells/uL (ref 850–3900)
MCH: 20.3 pg — ABNORMAL LOW (ref 27.0–33.0)
MCHC: 29.7 g/dL — ABNORMAL LOW (ref 32.0–36.0)
MCV: 68.4 fL — ABNORMAL LOW (ref 80.0–100.0)
MPV: 11.2 fL (ref 7.5–12.5)
Monocytes Relative: 9.7 %
Neutro Abs: 4473 cells/uL (ref 1500–7800)
Neutrophils Relative %: 63 %
Platelets: 306 10*3/uL (ref 140–400)
RBC: 6.1 10*6/uL — ABNORMAL HIGH (ref 4.20–5.80)
RDW: 17.4 % — ABNORMAL HIGH (ref 11.0–15.0)
Total Lymphocyte: 19.4 %
WBC: 7.1 10*3/uL (ref 3.8–10.8)

## 2019-12-11 LAB — COMPLETE METABOLIC PANEL WITH GFR
AG Ratio: 1.7 (calc) (ref 1.0–2.5)
ALT: 14 U/L (ref 9–46)
AST: 13 U/L (ref 10–35)
Albumin: 4.2 g/dL (ref 3.6–5.1)
Alkaline phosphatase (APISO): 56 U/L (ref 35–144)
BUN: 20 mg/dL (ref 7–25)
CO2: 30 mmol/L (ref 20–32)
Calcium: 9.4 mg/dL (ref 8.6–10.3)
Chloride: 101 mmol/L (ref 98–110)
Creat: 1.1 mg/dL (ref 0.70–1.18)
GFR, Est African American: 78 mL/min/{1.73_m2} (ref >=60)
GFR, Est Non African American: 67 mL/min/{1.73_m2} (ref >=60)
Globulin: 2.5 g/dL (calc) (ref 1.9–3.7)
Glucose, Bld: 144 mg/dL — ABNORMAL HIGH (ref 65–99)
Potassium: 5.6 mmol/L — ABNORMAL HIGH (ref 3.5–5.3)
Sodium: 136 mmol/L (ref 135–146)
Total Bilirubin: 0.6 mg/dL (ref 0.2–1.2)
Total Protein: 6.7 g/dL (ref 6.1–8.1)

## 2019-12-11 LAB — LIPID PANEL
Cholesterol: 112 mg/dL (ref <200)
HDL: 42 mg/dL (ref >=40)
LDL Cholesterol (Calc): 54 mg/dL (calc)
Non-HDL Cholesterol (Calc): 70 mg/dL (calc) (ref <130)
Total CHOL/HDL Ratio: 2.7 (calc) (ref <5.0)
Triglycerides: 82 mg/dL (ref <150)

## 2019-12-11 LAB — IRON, TOTAL/TOTAL IRON BINDING CAP
%SAT: 17 % (calc) — ABNORMAL LOW (ref 20–48)
Iron: 62 ug/dL (ref 50–180)
TIBC: 365 mcg/dL (calc) (ref 250–425)

## 2019-12-11 LAB — VITAMIN B12: Vitamin B-12: 348 pg/mL (ref 200–1100)

## 2019-12-11 LAB — TSH: TSH: 2.17 mIU/L (ref 0.40–4.50)

## 2019-12-11 LAB — HEMOGLOBIN A1C
Hgb A1c MFr Bld: 9 % of total Hgb — ABNORMAL HIGH (ref <5.7)
Mean Plasma Glucose: 212 (calc)
eAG (mmol/L): 11.7 (calc)

## 2020-01-04 ENCOUNTER — Other Ambulatory Visit: Payer: Self-pay

## 2020-01-04 DIAGNOSIS — E1169 Type 2 diabetes mellitus with other specified complication: Secondary | ICD-10-CM

## 2020-01-04 MED ORDER — EMPAGLIFLOZIN 10 MG PO TABS: 10.0000 mg | ORAL_TABLET | Freq: Every day | ORAL | 0 refills | Status: DC

## 2020-01-11 DIAGNOSIS — M1711 Unilateral primary osteoarthritis, right knee: Secondary | ICD-10-CM | POA: Diagnosis not present

## 2020-01-11 DIAGNOSIS — M25561 Pain in right knee: Secondary | ICD-10-CM | POA: Diagnosis not present

## 2020-01-11 DIAGNOSIS — Z96652 Presence of left artificial knee joint: Secondary | ICD-10-CM | POA: Diagnosis not present

## 2020-01-11 DIAGNOSIS — M25562 Pain in left knee: Secondary | ICD-10-CM | POA: Diagnosis not present

## 2020-01-20 ENCOUNTER — Ambulatory Visit: Payer: Medicare Other | Admitting: Adult Health

## 2020-02-11 DIAGNOSIS — N182 Chronic kidney disease, stage 2 (mild): Secondary | ICD-10-CM | POA: Insufficient documentation

## 2020-02-11 NOTE — Progress Notes (Signed)
Diabetes Education and Follow-Up Visit  69 y.o.male presents for diabetic education. He has Diabetes Mellitus type 2:  with diabetic chronic kidney disease, with diabetic mononeuropathy and with other diabetic opthalmic complication, he is on bASA, and denies foot ulcerations, hyperglycemia, hypoglycemia , increased appetite, nausea, polydipsia and polyuria.  Last hemoglobin A1c was: Lab Results  Component Value Date   HGBA1C 9.0 (H) 12/10/2019   HGBA1C 8.8 (H) 09/07/2019   HGBA1C 10.3 (H) 05/31/2019   He has been working on diet and exercise for diabetes  with history of CAD/PAD, he is on 35m ASA With hyperlipidemia on crestor 20 at goal less than 70 With CKD on telmisartan Retinopathy - 06/2018 + retinopathy HE IS DUE- states will get this week with his son he is on metformin 2000 mg daily glimepiride 4 mg only on 1 a day  jardiance 10 mg daily - has been out for 1 month, working with [facility] due to cost, needs paperwork completed He does check fasting glucose, 104-160, average 130 per patient HBartholome Billreported parasthesias in the past but none recently  Body mass index is 25.74 kg/m.   Lab Results  Component Value Date   GFRAA 78 12/10/2019   Lab Results  Component Value Date   GFRNONAA 67 12/10/2019    Lab Results  Component Value Date   CREATININE 1.10 12/10/2019   BUN 20 12/10/2019   NA 136 12/10/2019   K 5.6 (H) 12/10/2019   CL 101 12/10/2019   CO2 30 12/10/2019   Lab Results  Component Value Date   MICROALBUR 0.5 05/31/2019     He is on a Statin.  He is at goal of less than 70.  Lab Results  Component Value Date   CHOL 112 12/10/2019   HDL 42 12/10/2019   LDLCALC 54 12/10/2019   TRIG 82 12/10/2019   CHOLHDL 2.7 12/10/2019     Problem List has Hyperlipidemia associated with type 2 diabetes mellitus ([place]; Coronary atherosclerosis; Carotid stenosis; Essential hypertension; Vitamin D deficiency; Medication management; Encounter for Medicare annual wellness  exam; Neuropathy, diabetic ([place]; DM type 2 (diabetes mellitus, type 2) ([place]; Poor compliance; Overweight (BMI 25.0-29.9); Diabetic retinopathy of both eyes ([place]; beta thalassemia minor (trait) anemia; Primary osteoarthritis of left knee; Aorto-iliac atherosclerosis ([place]; and CKD stage 2 due to type 2 diabetes mellitus ([place] on their problem list.  Medications Current Outpatient Medications on File Prior to Visit  Medication Sig  . aspirin EC 81 MG tablet Take 81 mg by mouth daily. Swallow whole.  . blood glucose meter kit and supplies Test sugars once to twice daily  . Cholecalciferol (VITAMIN D3) 125 MCG (5000 UT) CAPS Take 5,000 Units by mouth 4 (four) times a week.   . Cinnamon 500 MG capsule Take 2 capsules (1,000 mg total) by mouth 2 (two) times daily. (Patient taking differently: Take 500 mg by mouth daily.)  . cyclobenzaprine (FLEXERIL) 10 MG tablet TAKE 1/2 TO 1 TABLET 3 X /DAY AS NEEDED FOR MUSCLE SPASM  . glimepiride (AMARYL) 4 MG tablet TAKE 1 TABLET BY MOUTH TWICE DAILY WITH MEALS FOR DIABETES  . metFORMIN (GLUCOPHAGE) 500 MG tablet Take 2 tablets 2 x /day with Meals for Diabetes  . metoprolol tartrate (LOPRESSOR) 25 MG tablet Take 1 tablet 2 x /day for BP  . Multiple Vitamin (MULTIVITAMIN) tablet Take 1 tablet by mouth daily.  . rosuvastatin (CRESTOR) 20 MG tablet TAKE 1 TABLET BY MOUTH EVERY DAY FOR CHOLESTEROL  . telmisartan (MICARDIS) 20 MG  tablet TAKE 1 TABLET DAILY FOR BP & KIDNEY PROTECTION   No current facility-administered medications on file prior to visit.    ROS- see HPI  Physical Exam: Blood pressure 112/62, pulse 82, temperature (!) 96.8 F (36 C), weight 134 lb (60.8 kg), SpO2 97 %. Body mass index is 25.74 kg/m. General Appearance: Well nourished, in no apparent distress. Eyes: PERRLA, EOMs, conjunctiva no swelling or erythema ENT/Mouth: Ext aud canals clear, TMs without erythema, bulging. No erythema, swelling, or exudate on post pharynx.  Tonsils not  swollen or erythematous. Hearing normal.  Respiratory: Respiratory effort normal, BS equal bilaterally without rales, rhonchi, wheezing or stridor.  Cardio: RRR with no MRGs. Brisk peripheral pulses without edema.  Abdomen: Soft, + BS.  Non tender, no guarding, rebound, hernias, masses. Musculoskeletal: Full ROM, 5/5 strength, normal gait.  Skin: Warm, dry without rashes, lesions, ecchymosis.  Neuro: Cranial nerves intact. Normal muscle tone, no cerebellar symptoms. Sensation intact.    Plan and Assessment:  Type 2 diabetes mellitus with other specified complication, without long-term current use of insulin [facility]) Education: Reviewed 'ABCs' of diabetes management (respective goals in parentheses):  A1C (<7), blood pressure (<130/80), and cholesterol (LDL <70) Eye Exam yearly and Dental Exam every 6 months. Dietary recommendations Physical Activity recommendations - Strongly advised him to start checking sugars at different times of the day - check 2 times a day, rotating checks - given sugar log and advised how to fill it and to bring it at next appt  - given foot care handout and explained the principles  - given instructions for hypoglycemia management  -     Fructosamine -     BASIC METABOLIC PANEL WITH GFR -     empagliflozin (JARDIANCE) 25 MG TABS tablet; Take 1 tablet (25 mg total) by mouth daily. -     [facility] paperwork completed and faxed with script; given samples 10 mg to take daily x 4 weeks then 25 mg daily   Hyperlipidemia associated with type 2 diabetes mellitus ([place]) At goal, continue statin   Diabetic polyneuropathy associated with type 2 diabetes mellitus (HCC) Continue daily foot checks, control sugars  Mild nonproliferative diabetic retinopathy of both eyes without macular edema associated with type 2 diabetes mellitus ([place]) Encouraged diabetes eye exam prior to end of the year  Essential hypertension Continue current meds -     BASIC METABOLIC PANEL WITH GFR  CKD  stage 2 due to type 2 diabetes mellitus ([place]) Increase fluids, avoid NSAIDS, monitor sugars, will monito -     BASIC METABOLIC PANEL WITH GFR  Need for influenza vaccination -     Flu vaccine HIGH DOSE PF   Future Appointments  Date Time Provider [place]  06/05/2020 10:00 AM Garnet Sierras, NP [facility] None

## 2020-02-21 ENCOUNTER — Other Ambulatory Visit: Payer: Self-pay

## 2020-02-21 ENCOUNTER — Ambulatory Visit (INDEPENDENT_AMBULATORY_CARE_PROVIDER_SITE_OTHER): Payer: Medicare Other | Admitting: Adult Health

## 2020-02-21 ENCOUNTER — Encounter: Payer: Self-pay | Admitting: Adult Health

## 2020-02-21 VITALS — BP 112/62 | HR 82 | Temp 96.8°F | Wt 134.0 lb

## 2020-02-21 DIAGNOSIS — N182 Chronic kidney disease, stage 2 (mild): Secondary | ICD-10-CM

## 2020-02-21 DIAGNOSIS — E1169 Type 2 diabetes mellitus with other specified complication: Secondary | ICD-10-CM | POA: Diagnosis not present

## 2020-02-21 DIAGNOSIS — I10 Essential (primary) hypertension: Secondary | ICD-10-CM

## 2020-02-21 DIAGNOSIS — Z23 Encounter for immunization: Secondary | ICD-10-CM

## 2020-02-21 DIAGNOSIS — E1142 Type 2 diabetes mellitus with diabetic polyneuropathy: Secondary | ICD-10-CM

## 2020-02-21 DIAGNOSIS — E113293 Type 2 diabetes mellitus with mild nonproliferative diabetic retinopathy without macular edema, bilateral: Secondary | ICD-10-CM

## 2020-02-21 DIAGNOSIS — E785 Hyperlipidemia, unspecified: Secondary | ICD-10-CM

## 2020-02-21 DIAGNOSIS — E1122 Type 2 diabetes mellitus with diabetic chronic kidney disease: Secondary | ICD-10-CM | POA: Diagnosis not present

## 2020-02-21 MED ORDER — EMPAGLIFLOZIN 25 MG PO TABS: 25.0000 mg | ORAL_TABLET | Freq: Every day | ORAL | 3 refills | Status: DC

## 2020-02-21 MED ORDER — EMPAGLIFLOZIN 10 MG PO TABS: 10.0000 mg | ORAL_TABLET | Freq: Every day | ORAL | 0 refills | Status: DC

## 2020-02-21 NOTE — Patient Instructions (Signed)
Please take jardiance 10 mg daily for 4 weeks, then 25 mg daily   Please get diabetes eye exam and send [place] the report before the end of the year    Empagliflozin oral tablets What is this medicine? EMPAGLIFLOZIN (EM pa gli FLOE zin) helps to treat type 2 diabetes. It helps to control blood sugar. This drug may also reduce the risk of heart attack or stroke if you have type 2 diabetes and risk factors for heart disease. Treatment is combined with diet and exercise. This medicine may be used for other purposes; ask your health care provider or pharmacist if you have questions. COMMON BRAND NAME(S): Jardiance What should I tell my health care provider before I take this medicine? They need to know if you have any of these conditions:  dehydration  diabetic ketoacidosis  diet low in salt  eating less due to illness, surgery, dieting, or any other reason  having surgery  high cholesterol  high levels of potassium in the blood  history of pancreatitis or pancreas problems  history of yeast infection of the penis or vagina  if you often drink alcohol  infections in the bladder, kidneys, or urinary tract  kidney disease  liver disease  low blood pressure  on hemodialysis  problems urinating  type 1 diabetes  uncircumcised male  an unusual or allergic reaction to empagliflozin, other medicines, foods, dyes, or preservatives  pregnant or trying to get pregnant  breast-feeding How should I use this medicine? Take this medicine by mouth with a glass of water. Follow the directions on the prescription label. Take it in the morning, with or without food. Take your dose at the same time each day. Do not take more often than directed. Do not stop taking except on your doctor's advice. Talk to your pediatrician regarding the use of this medicine in children. Special care may be needed. Overdosage: If you think you have taken too much of this medicine contact a poison  control center or emergency room at once. NOTE: This medicine is only for you. Do not share this medicine with others. What if I miss a dose? If you miss a dose, take it as soon as you can. If it is almost time for your next dose, take only that dose. Do not take double or extra doses. What may interact with this medicine? Do not take this medicine with any of the following medications:  gatifloxacin This medicine may also interact with the following medications:  alcohol  certain medicines for blood pressure, heart disease  diuretics This list may not describe all possible interactions. Give your health care provider a list of all the medicines, herbs, non-prescription drugs, or dietary supplements you use. Also tell them if you smoke, drink alcohol, or use illegal drugs. Some items may interact with your medicine. What should I watch for while using this medicine? Visit your doctor or health care professional for regular checks on your progress. This medicine can cause a serious condition in which there is too much acid in the blood. If you develop nausea, vomiting, stomach pain, unusual tiredness, or breathing problems, stop taking this medicine and call your doctor right away. If possible, use a ketone dipstick to check for ketones in your urine. A test called the HbA1C (A1C) will be monitored. This is a simple blood test. It measures your blood sugar control over the last 2 to 3 months. You will receive this test every 3 to 6 months. Learn  how to check your blood sugar. Learn the symptoms of low and high blood sugar and how to manage them. Always carry a quick-source of sugar with you in case you have symptoms of low blood sugar. Examples include hard sugar candy or glucose tablets. Make sure others know that you can choke if you eat or drink when you develop serious symptoms of low blood sugar, such as seizures or unconsciousness. They must get medical help at once. Tell your doctor or  health care professional if you have high blood sugar. You might need to change the dose of your medicine. If you are sick or exercising more than usual, you might need to change the dose of your medicine. Do not skip meals. Ask your doctor or health care professional if you should avoid alcohol. Many nonprescription cough and cold products contain sugar or alcohol. These can affect blood sugar. Wear a medical ID bracelet or chain, and carry a card that describes your disease and details of your medicine and dosage times. What side effects may I notice from receiving this medicine? Side effects that you should report to your doctor or health care professional as soon as possible:  allergic reactions like skin rash, itching or hives, swelling of the face, lips, or tongue  breathing problems  dizziness  feeling faint or lightheaded, falls  muscle weakness  nausea, vomiting, unusual stomach upset or pain  penile discharge, itching, or pain in men  signs and symptoms of a genital infection, such as fever; tenderness, redness, or swelling in the genitals or area from the genitals to the back of the rectum  signs and symptoms of low blood sugar such as feeling anxious, confusion, dizziness, increased hunger, unusually weak or tired, sweating, shakiness, cold, irritable, headache, blurred vision, fast heartbeat, loss of consciousness  signs and symptoms of a urinary tract infection, such as fever, chills, a burning feeling when urinating, blood in the urine, back pain  trouble passing urine or change in the amount of urine, including an urgent need to urinate more often, in larger amounts, or at night  unusual tiredness  vaginal discharge, itching, or odor in women Side effects that usually do not require medical attention (report to your doctor or health care professional if they continue or are bothersome):  mild increase in urination  thirsty This list may not describe all possible  side effects. Call your doctor for medical advice about side effects. You may report side effects to FDA at 1-800-FDA-1088. Where should I keep my medicine? Keep out of the reach of children. Store at room temperature between 20 and 25 degrees C (68 and 77 degrees F). Throw away any unused medicine after the expiration date. NOTE: This sheet is a summary. It may not cover all possible information. If you have questions about this medicine, talk to your doctor, pharmacist, or health care provider.  2020 Elsevier/Gold Standard (2016-11-07 10:25:34)

## 2020-02-22 ENCOUNTER — Other Ambulatory Visit: Payer: Self-pay | Admitting: Adult Health

## 2020-02-22 ENCOUNTER — Other Ambulatory Visit: Payer: Self-pay | Admitting: Internal Medicine

## 2020-02-22 DIAGNOSIS — I10 Essential (primary) hypertension: Secondary | ICD-10-CM

## 2020-02-22 DIAGNOSIS — E875 Hyperkalemia: Secondary | ICD-10-CM

## 2020-02-22 DIAGNOSIS — T464X5A Adverse effect of angiotensin-converting-enzyme inhibitors, initial encounter: Secondary | ICD-10-CM

## 2020-02-25 LAB — BASIC METABOLIC PANEL WITH GFR
BUN: 16 mg/dL (ref 7–25)
CO2: 29 mmol/L (ref 20–32)
Calcium: 9.6 mg/dL (ref 8.6–10.3)
Chloride: 99 mmol/L (ref 98–110)
Creat: 1.04 mg/dL (ref 0.70–1.18)
GFR, Est African American: 83 mL/min/{1.73_m2} (ref >=60)
GFR, Est Non African American: 72 mL/min/{1.73_m2} (ref >=60)
Glucose, Bld: 188 mg/dL — ABNORMAL HIGH (ref 65–99)
Potassium: 5.7 mmol/L — ABNORMAL HIGH (ref 3.5–5.3)
Sodium: 134 mmol/L — ABNORMAL LOW (ref 135–146)

## 2020-02-25 LAB — FRUCTOSAMINE: Fructosamine: 389 umol/L — ABNORMAL HIGH (ref 205–285)

## 2020-03-13 ENCOUNTER — Telehealth: Payer: Self-pay

## 2020-03-13 NOTE — Telephone Encounter (Signed)
Patient enrollment in the [employer] Patient Assistance Program has been approved. Eligibility for the program is from March 11, 2020-March 10, 2001

## 2020-03-28 ENCOUNTER — Other Ambulatory Visit: Payer: Self-pay | Admitting: Internal Medicine

## 2020-03-28 DIAGNOSIS — G8929 Other chronic pain: Secondary | ICD-10-CM

## 2020-03-28 DIAGNOSIS — M545 Low back pain, unspecified: Secondary | ICD-10-CM

## 2020-04-03 ENCOUNTER — Telehealth: Payer: Self-pay

## 2020-04-03 ENCOUNTER — Other Ambulatory Visit: Payer: Self-pay

## 2020-04-03 ENCOUNTER — Ambulatory Visit: Payer: Medicare Other | Admitting: Adult Health

## 2020-04-03 ENCOUNTER — Encounter: Payer: Self-pay | Admitting: Adult Health

## 2020-04-03 DIAGNOSIS — U071 COVID-19: Secondary | ICD-10-CM | POA: Diagnosis not present

## 2020-04-03 MED ORDER — PROMETHAZINE-DM 6.25-15 MG/5ML PO SYRP: 5.0000 mL | ORAL_SOLUTION | Freq: Four times a day (QID) | ORAL | 1 refills | Status: DC | PRN

## 2020-04-03 NOTE — Progress Notes (Signed)
THIS ENCOUNTER IS A VIRTUAL VISIT DUE TO COVID-19 - PATIENT WAS NOT SEEN IN THE OFFICE.  PATIENT HAS CONSENTED TO VIRTUAL VISIT / TELEMEDICINE VISIT   Virtual Visit via telephone Note  I connected with William Haney on 04/03/2020 by telephone.  I verified that I am speaking with the correct person using two identifiers.    I discussed the limitations of evaluation and management by telemedicine and the availability of in person appointments. The patient expressed understanding and agreed to proceed.  History of Present Illness:  There were no vitals taken for this visit.  70 y.o. patient with hx of T2DM, hn contacted office due covid 19. Sx began 5 days ago and he reports had + test at home on 03/31/2020. This patient reports was vaccinated 3/3 [place] for covid 19.   Sx began on 03/30/2020, 5 days days ago with sore throat and vaguely feeling unwell; he reports general body aches (improving) and fairly sore throat, painful to drink, but no drooling. Denies fever/chills, 97.3 this AM. Endorses intermittent dry cough. He reports woke up this AM with sx improving from previous, feels on the mend. Denies dyspnea, chest congestion, wheezing.   He reports has been taking nyquil/dayquil, halls cough drops, green tea; has been taking advil for sore throat with some benefit. Still coughing despite this which seems to irritate throat.   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  empagliflozin (JARDIANCE) 25 MG TABS tablet, Take 1 tablet (25 mg total) by mouth daily. [name]  glimepiride (AMARYL) 4 MG tablet, TAKE 1 TABLET BY MOUTH TWICE DAILY WITH MEALS FOR DIABETES .  metFORMIN (GLUCOPHAGE) 500 MG tablet, TAKE 2 TABLETS TWICE A DAY WITH MEALS FOR DIABETES  Current Outpatient Medications  (Cardiovascular):  .  metoprolol tartrate (LOPRESSOR) 25 MG tablet, Take      1 tablet       2 x /day (every 12 hours)        for BP .  rosuvastatin (CRESTOR) 20 MG tablet, TAKE 1 TABLET BY MOUTH EVERY DAY FOR CHOLESTEROL .  telmisartan (MICARDIS) 20 MG tablet, TAKE 1 TABLET DAILY FOR BP & KIDNEY PROTECTION   Current Outpatient Medications (Analgesics):  .  aspirin EC 81 MG tablet, Take 81 mg by mouth daily. Swallow whole.   Current Outpatient Medications (Other):  .  blood glucose meter kit and supplies, Test sugars once to twice daily .  Cholecalciferol (VITAMIN D3) 125 MCG (5000 UT) CAPS, Take 5,000 Units by mouth 4 (four) times a week.  .  Cinnamon 500 MG capsule, Take 2 capsules (1,000 mg total) by mouth 2 (two) times daily. (Patient taking differently: Take 500 mg by mouth daily.) .  cyclobenzaprine (FLEXERIL) 10 MG tablet, TAKE 1/2 TO 1 TABLET BY MOUTH 3 TIMES A DAY AS NEEDED FOR MUSCLE SPASMS .  Multiple Vitamin (MULTIVITAMIN) tablet, Take 1 tablet by mouth daily.  Allergies:  Allergies  Allergen Reactions  . Ace Inhibitors Cough  . Statins Other (See Comments)    Elevated LFT's    Problem list He has Hyperlipidemia associated with type 2 diabetes mellitus ([place]); Coronary atherosclerosis; Carotid stenosis; Essential hypertension; Vitamin D deficiency; Medication management; Encounter for Medicare annual wellness exam; Neuropathy, diabetic ([place]); DM type 2 (diabetes mellitus, type 2) ([place]); Poor  compliance; Overweight (BMI 25.0-29.9); Diabetic retinopathy of both eyes ([place]); beta thalassemia minor (trait) anemia; Primary osteoarthritis of left knee; Aorto-iliac atherosclerosis ([place]); and CKD stage 2 due to type 2 diabetes mellitus ([place]) on their problem list.   Social History:   reports that he quit smoking about 14 years ago. He started smoking about 49 years ago. He has a 34.00 pack-year smoking history. He has never used smokeless tobacco. He reports that he does not drink  alcohol and does not use drugs.   Observations/Objective:  General : Well sounding patient in no apparent distress HEENT: no hoarseness, no cough for duration of visit Lungs: speaks in complete sentences, no audible wheezing, no apparent distress Neurological: alert, oriented x 3 Psychiatric: pleasant, judgement appropriate    Assessment and Plan:  COVID-19  Covid 19 positive per rapid screening test in 3/3 vaccinated Currently symptoms are mild  Risk factors include T2DM, htn, age Regular breathing exercises, proning EC bASA daily for clot prevention unless contraindicated, regular walking/calf exercises Take tylenol/advil PRN temp 101+ Push hydration  Sx supportive therapy;  Pharyngitis: take medicationss as prescribed, increase fluids,  Salt water gargles. If symptoms do not improve or get worse contact the office Steroid taper was discussed but deferred in light of improving sx, mild overall, poorly controlled diabetic; he is in agreement; will follow up if needed for this Immune support with vitamin C, zinc, vitamin D reviewed Follow up via mychart or telephone if needed Advised patient obtain O2 monitor; present to ED if persistently <88% or with severe dyspnea, any CP, fever uncontrolled by tylenol, confusion, sudden decline Should remain in isolation until at least 5 days from onset of sx, 24-48 hours fever free without tylenol, sx such as cough are overall improved.    Follow Up Instructions:  I discussed the assessment and treatment plan with the patient. The patient was provided an opportunity to ask questions and all were answered. The patient agreed with the plan and demonstrated an understanding of the instructions.   The patient was advised to call back or seek an in-person evaluation if the symptoms worsen or if the condition fails to improve as anticipated.  I provided 20 minutes of non-face-to-face time during this encounter.   Izora Ribas, NP  Future  Appointments  Date Time Provider [place]  06/05/2020 10:00 AM Garnet Sierras, NP [facility] None

## 2020-04-03 NOTE — Telephone Encounter (Signed)
Error

## 2020-04-17 ENCOUNTER — Encounter (HOSPITAL_BASED_OUTPATIENT_CLINIC_OR_DEPARTMENT_OTHER): Payer: Self-pay | Admitting: Emergency Medicine

## 2020-04-17 ENCOUNTER — Ambulatory Visit (HOSPITAL_COMMUNITY)
Admission: RE | Admit: 2020-04-17 | Discharge: 2020-04-17 | Disposition: A | Discharge status: Nursing Home (Includes ICF) | Payer: Medicare Other | Source: Ambulatory Visit | Attending: Internal Medicine | Admitting: Internal Medicine

## 2020-04-17 ENCOUNTER — Emergency Department (HOSPITAL_BASED_OUTPATIENT_CLINIC_OR_DEPARTMENT_OTHER)
Admission: EM | Admit: 2020-04-17 | Discharge: 2020-04-17 | Disposition: A | Discharge status: Home / Self Care | Payer: Medicare Other | Attending: Emergency Medicine | Admitting: Emergency Medicine

## 2020-04-17 ENCOUNTER — Encounter (HOSPITAL_COMMUNITY): Payer: Self-pay

## 2020-04-17 ENCOUNTER — Other Ambulatory Visit: Payer: Self-pay

## 2020-04-17 ENCOUNTER — Emergency Department (HOSPITAL_BASED_OUTPATIENT_CLINIC_OR_DEPARTMENT_OTHER): Payer: Medicare Other

## 2020-04-17 VITALS — BP 136/77 | HR 124 | Temp 98.5°F | Resp 18

## 2020-04-17 DIAGNOSIS — Z7984 Long term (current) use of oral hypoglycemic drugs: Secondary | ICD-10-CM | POA: Diagnosis not present

## 2020-04-17 DIAGNOSIS — Z87891 Personal history of nicotine dependence: Secondary | ICD-10-CM | POA: Insufficient documentation

## 2020-04-17 DIAGNOSIS — I2510 Atherosclerotic heart disease of native coronary artery without angina pectoris: Secondary | ICD-10-CM | POA: Insufficient documentation

## 2020-04-17 DIAGNOSIS — N182 Chronic kidney disease, stage 2 (mild): Secondary | ICD-10-CM | POA: Diagnosis not present

## 2020-04-17 DIAGNOSIS — E1140 Type 2 diabetes mellitus with diabetic neuropathy, unspecified: Secondary | ICD-10-CM | POA: Diagnosis not present

## 2020-04-17 DIAGNOSIS — W19XXXA Unspecified fall, initial encounter: Secondary | ICD-10-CM

## 2020-04-17 DIAGNOSIS — Z8616 Personal history of COVID-19: Secondary | ICD-10-CM | POA: Insufficient documentation

## 2020-04-17 DIAGNOSIS — Z96652 Presence of left artificial knee joint: Secondary | ICD-10-CM | POA: Insufficient documentation

## 2020-04-17 DIAGNOSIS — S299XXA Unspecified injury of thorax, initial encounter: Secondary | ICD-10-CM | POA: Diagnosis present

## 2020-04-17 DIAGNOSIS — R000 Tachycardia, unspecified: Secondary | ICD-10-CM

## 2020-04-17 DIAGNOSIS — S2241XA Multiple fractures of ribs, right side, initial encounter for closed fracture: Secondary | ICD-10-CM

## 2020-04-17 DIAGNOSIS — W010XXA Fall on same level from slipping, tripping and stumbling without subsequent striking against object, initial encounter: Secondary | ICD-10-CM | POA: Diagnosis not present

## 2020-04-17 DIAGNOSIS — E1122 Type 2 diabetes mellitus with diabetic chronic kidney disease: Secondary | ICD-10-CM | POA: Insufficient documentation

## 2020-04-17 DIAGNOSIS — M25511 Pain in right shoulder: Secondary | ICD-10-CM | POA: Diagnosis not present

## 2020-04-17 DIAGNOSIS — Z743 Need for continuous supervision: Secondary | ICD-10-CM | POA: Diagnosis not present

## 2020-04-17 DIAGNOSIS — Z7982 Long term (current) use of aspirin: Secondary | ICD-10-CM | POA: Diagnosis not present

## 2020-04-17 DIAGNOSIS — Z79899 Other long term (current) drug therapy: Secondary | ICD-10-CM | POA: Diagnosis not present

## 2020-04-17 DIAGNOSIS — R0902 Hypoxemia: Secondary | ICD-10-CM | POA: Diagnosis not present

## 2020-04-17 DIAGNOSIS — R6889 Other general symptoms and signs: Secondary | ICD-10-CM | POA: Diagnosis not present

## 2020-04-17 DIAGNOSIS — I129 Hypertensive chronic kidney disease with stage 1 through stage 4 chronic kidney disease, or unspecified chronic kidney disease: Secondary | ICD-10-CM | POA: Insufficient documentation

## 2020-04-17 DIAGNOSIS — Z9889 Other specified postprocedural states: Secondary | ICD-10-CM | POA: Diagnosis not present

## 2020-04-17 DIAGNOSIS — Z951 Presence of aortocoronary bypass graft: Secondary | ICD-10-CM | POA: Diagnosis not present

## 2020-04-17 DIAGNOSIS — E1165 Type 2 diabetes mellitus with hyperglycemia: Secondary | ICD-10-CM | POA: Diagnosis not present

## 2020-04-17 DIAGNOSIS — I499 Cardiac arrhythmia, unspecified: Secondary | ICD-10-CM | POA: Diagnosis not present

## 2020-04-17 MED ORDER — METOPROLOL TARTRATE 25 MG PO TABS
25.0000 mg | ORAL_TABLET | Freq: Once | ORAL | Status: AC
  Administered 2020-04-17: 25 mg via ORAL
  Filled 2020-04-17: qty 1

## 2020-04-17 MED ORDER — ACETAMINOPHEN 325 MG PO TABS
650.0000 mg | ORAL_TABLET | Freq: Once | ORAL | Status: AC
  Administered 2020-04-17: 650 mg via ORAL
  Filled 2020-04-17: qty 2

## 2020-04-17 NOTE — ED Provider Notes (Addendum)
Per nursing note and report from patient: "Pt states that he tripped and fell onto concrete Friday evening, landing on his right lateral and posterior area.  C/o pain to right rib cage area that has increased with pain. States rib pain increases with inspiration.  Denies SOB, CP, fever, dizziness, head trauma at time of fall or LOC.  Pt tachycardia and reports sensing it's beating fast, states HR increased this morning. Has not taken his metoprolol or other routine Rx since falling.   Took 400mg  ibuprofen last night with some improvement."  On arrival to urgent care patient noted to have a heart rate of 124, 122 on EKG. He typically takes metoprolol 25mg  bid which he has not taken. There is no evidence on brief chart review that he has had similar tachycardia in the past, but metoprolol is for BP management. BP stable currently. EKG noted changes to leads V3,, v4, v5, v6, as compared to ekg from 05/07/2018. History of CABG. Recommend further cardiac evaluation in the ER at this time, with EMS called to provide transport now. Patient verbalized understanding and agreeable to plan.         Zigmund Gottron, NP 04/17/20 1341

## 2020-04-17 NOTE — ED Provider Notes (Signed)
[facility] Provider Note   CSN: [id number] Arrival date & time: 04/17/20  1450     History Chief Complaint  Patient presents with  . Tachycardia  . Fall    William Haney is a 70 y.o. male.  Presents with right side chest pain since 4 days ago.  He states 4 days ago he tripped and fell onto his right side while outside on the concrete.  Complaining of persistent right-sided chest pain since that fall.  It hurts when he moves his torso a certain way.  Denies any fevers or cough.  Denies any mid chest pain.  Denies any shortness of breath.        Past Medical History:  Diagnosis Date  . Anemia   . Carotid artery stenosis 01/2017   L-ICA 1-39%, R-ICA 40-59% doppler  . Coronary artery disease    s/p CABG in 2008, w/ LIMA-LAD, SVG-Diag, SVG-OM, SVG-PL  . Diabetes mellitus type II   . Hyperlipidemia   . Hypertension   . Vitamin D deficiency     Patient Active Problem List   Diagnosis Date Noted  . COVID-19 (03/31/2020) 04/03/2020  . CKD stage 2 due to type 2 diabetes mellitus ([place]) 02/11/2020  . Aorto-iliac atherosclerosis ([place]) 05/26/2019  . Primary osteoarthritis of left knee 12/18/2018  . beta thalassemia minor (trait) anemia 03/08/2018  . Overweight (BMI 25.0-29.9) 08/10/2017  . Diabetic retinopathy of both eyes ([place]) 08/10/2017  . Poor compliance 09/09/2015  . DM type 2 (diabetes mellitus, type 2) ([place]) 09/08/2015  . Neuropathy, diabetic ([place]) 05/19/2015  . Encounter for Medicare annual wellness exam 09/21/2014  . Essential hypertension 10/11/2013  . Vitamin D deficiency 10/11/2013  . Medication management 10/11/2013  . Hyperlipidemia associated with type 2 diabetes mellitus ([place]) 03/01/2010  . Coronary atherosclerosis 03/01/2010  . Carotid stenosis 03/01/2010    Past Surgical History:  Procedure Laterality Date  . CORONARY ARTERY BYPASS GRAFT  2008   LIMA to the LAD, saphenous vein graft to diagonal, saphenous vein graft to OM,  saphenous vein graft to posterolateral 2008  . TOTAL KNEE ARTHROPLASTY Left 12/18/2018   Procedure: TOTAL KNEE ARTHROPLASTY;  Surgeon: Dorna Leitz, MD;  Location: [facility];  Service: Orthopedics;  Laterality: Left;       Family History  Problem Relation Age of Onset  . Hypertension Father   . Heart disease Father   . Hyperlipidemia Father   . Heart attack Father   . Diabetes Sister   . Heart disease Brother     Social History   Tobacco Use  . Smoking status: Former Smoker    Packs/day: 1.00    Years: 34.00    Pack years: 34.00    Start date: 33    Quit date: 08/28/2005    Years since quitting: 14.6  . Smokeless tobacco: Never Used  Vaping Use  . Vaping Use: Never used  Substance Use Topics  . Alcohol use: No  . Drug use: No    Home Medications Prior to Admission medications   Medication Sig Start Date End Date Taking? Authorizing Provider  aspirin EC 81 MG tablet Take 81 mg by mouth daily. Swallow whole.    [provider]  blood glucose meter kit and supplies Test sugars once to twice daily 08/24/18   Vladimir Crofts, PA-C  Cholecalciferol (VITAMIN D3) 125 MCG (5000 UT) CAPS Take 5,000 Units by mouth 4 (four) times a week.     [provider]  Verneita Griffes  500 MG capsule Take 2 capsules (1,000 mg total) by mouth 2 (two) times daily. Patient taking differently: Take 500 mg by mouth daily. 02/05/17   Liane Comber, NP  cyclobenzaprine (FLEXERIL) 10 MG tablet TAKE 1/2 TO 1 TABLET BY MOUTH 3 TIMES A DAY AS NEEDED FOR MUSCLE SPASMS 03/28/20   Garnet Sierras, NP  empagliflozin (JARDIANCE) 25 MG TABS tablet Take 1 tablet (25 mg total) by mouth daily. 02/21/20   Liane Comber, NP  glimepiride (AMARYL) 4 MG tablet TAKE 1 TABLET BY MOUTH TWICE DAILY WITH MEALS FOR DIABETES 06/23/19   [name] R, PA-C  metFORMIN (GLUCOPHAGE) 500 MG tablet TAKE 2 TABLETS TWICE A DAY WITH MEALS FOR DIABETES 02/22/20   Unk Pinto, MD  metoprolol tartrate (LOPRESSOR) 25  MG tablet Take      1 tablet       2 x /day (every 12 hours)        for BP 02/22/20   Unk Pinto, MD  Multiple Vitamin (MULTIVITAMIN) tablet Take 1 tablet by mouth daily.    [provider]  promethazine-dextromethorphan (PROMETHAZINE-DM) 6.25-15 MG/5ML syrup Take 5 mLs by mouth 4 (four) times daily as needed for cough. 04/03/20   Liane Comber, NP  rosuvastatin (CRESTOR) 20 MG tablet TAKE 1 TABLET BY MOUTH EVERY DAY FOR CHOLESTEROL 05/21/19   [name] R, PA-C  telmisartan (MICARDIS) 20 MG tablet TAKE 1 TABLET DAILY FOR BP & KIDNEY PROTECTION 02/22/20   Unk Pinto, MD    Allergies    Ace inhibitors and Statins  Review of Systems   Review of Systems  Constitutional: Negative for fever.  HENT: Negative for ear pain and sore throat.   Eyes: Negative for pain.  Respiratory: Negative for cough.   Cardiovascular: Positive for chest pain.  Gastrointestinal: Negative for abdominal pain.  Genitourinary: Negative for flank pain.  Musculoskeletal: Negative for back pain.  Skin: Negative for color change and rash.  Neurological: Negative for syncope.  All other systems reviewed and are negative.   Physical Exam Updated Vital Signs BP (!) 170/96 (BP Location: Right Arm)   Pulse (!) 105   Temp 97.9 F (36.6 C) (Oral)   Resp 20   Ht 5' 1"  (1.549 m)   Wt 59.9 kg   SpO2 100%   BMI 24.94 kg/m   Physical Exam Constitutional:      General: He is not in acute distress.    Appearance: He is well-developed.  HENT:     Head: Normocephalic.     Nose: Nose normal.  Eyes:     Extraocular Movements: Extraocular movements intact.  Cardiovascular:     Rate and Rhythm: Tachycardia present.  Pulmonary:     Effort: Pulmonary effort is normal.  Skin:    Coloration: Skin is not jaundiced.  Neurological:     Mental Status: He is alert. Mental status is at baseline.     ED Results / Procedures / Treatments   Labs (all labs ordered are listed, but only abnormal  results are displayed) Labs Reviewed - No data to display  EKG EKG Interpretation  Date/Time:  Monday April 17 2020 15:05:00 EST Ventricular Rate:  102 PR Interval:    QRS Duration: 98 QT Interval:  329 QTC Calculation: 429 R Axis:   -10 Text Interpretation: Sinus tachycardia LVH with secondary repolarization abnormality Inferior infarct, age indeterminate Confirmed by Thamas Jaegers ([id number]) on 04/17/2020 3:18:04 PM   Radiology DG Ribs Unilateral W/Chest Right  Result Date: 04/17/2020 CLINICAL DATA:  [place] out of bed 3 days ago, right flank and shoulder pain, pain with inspiration EXAM: RIGHT RIBS AND CHEST - 3+ VIEW COMPARISON:  12/15/2018 FINDINGS: Frontal view of the chest as well as frontal and oblique views of the right thoracic cage are obtained. Postsurgical changes are seen from previous CABG. The cardiac silhouette is unremarkable. No airspace disease, effusion, or pneumothorax. There are displaced fractures of the right anterior fifth through seventh ribs, best seen on the oblique projection. No other acute bony abnormalities. IMPRESSION: 1. Right anterior fifth through seventh rib fractures. 2. No effusion or pneumothorax. Electronically Signed   By: Randa Ngo M.D.   On: 04/17/2020 15:44    Procedures Procedures   Medications Ordered in ED Medications  metoprolol tartrate (LOPRESSOR) tablet 25 mg (25 mg Oral Given 04/17/20 1551)  acetaminophen (TYLENOL) tablet 650 mg (650 mg Oral Given 04/17/20 1551)    ED Course  I have reviewed the triage vital signs and the nursing notes.  Pertinent labs & imaging results that were available during my care of the patient were reviewed by me and considered in my medical decision making (see chart for details).    MDM Rules/Calculators/A&P                          Patient states he did not take his metoprolol today.  This was provided for him here in the ER.  EKG appears similar to prior EKGs with T wave inversions in the inferior  and lateral leads persistent.  Chest x-ray performed showing multiple right-sided rib fractures.  Patient is already 4 days out with no evidence of pneumothorax or hematoma thorax.  Will recommend continued monitoring with his primary care doctor within the week.  Advising immediate return for worsening pain difficulty breathing or fevers.  Recommend inspiratory spirometer and continued Tylenol for pain management.  Advised immediate return for any worsening symptoms.   Final Clinical Impression(s) / ED Diagnoses Final diagnoses:  Closed fracture of multiple ribs of right side, initial encounter    Rx / DC Orders ED Discharge Orders    None       Luna Fuse, MD 04/17/20 [id number]

## 2020-04-17 NOTE — Discharge Instructions (Addendum)
I would like you to go to the hospital for further evaluation of your new elevated heart rate as well as changes to your ekg

## 2020-04-17 NOTE — ED Notes (Signed)
EMS here for transport and decided to take pt to [facility] for cardiac workup and eval.

## 2020-04-17 NOTE — ED Triage Notes (Signed)
Coming form [facility] for a fall on Friday out of the bed,  C/o rt flank and  Shoulder , hurts a deep breath he states  Had only taken his diabetes med x 1 since then and NONe of his BP meds or heart meds , pt has hx of CABG, ems called for increased heart rate and ? Changes in EKG

## 2020-04-17 NOTE — ED Triage Notes (Signed)
Pt states that he tripped and fell onto concrete Friday evening, landing on his right lateral and posterior area.  C/o pain to right rib cage area that has increased with pain. States rib pain increases with inspiration.  Denies SOB, CP, fever, dizziness, head trauma at time of fall or LOC.  Pt tachycardia and reports sensing it's beating fast, states HR increased this morning. Has not taken his metoprolol or other routine Rx since falling.   Took 400mg  ibuprofen last night with some improvement.  Bilateral lung sounds CTA.

## 2020-04-17 NOTE — ED Notes (Signed)
Placed on monitor and explained to pt's daughter need for EMS transport

## 2020-04-17 NOTE — Discharge Instructions (Addendum)
Your x-ray shows 3 rib fractures on the right side.  Use inspiratory spirometer 5 times a day.  Return if you have difficulty breathing or fevers or worsening pain.  See your doctor in 3 to 5 days.

## 2020-04-17 NOTE — ED Notes (Signed)
EKG given to William Haney who arrived at [facility] for eval and recommended to pt for him to go to ED via EMS 2/2 EKG changes, tachycardia, fall

## 2020-05-06 ENCOUNTER — Other Ambulatory Visit: Payer: Self-pay | Admitting: Internal Medicine

## 2020-05-06 MED ORDER — GLIMEPIRIDE 4 MG PO TABS: ORAL_TABLET | ORAL | 0 refills | Status: DC

## 2020-05-10 LAB — HM DIABETES EYE EXAM

## 2020-05-17 ENCOUNTER — Encounter: Payer: Self-pay | Admitting: *Deleted

## 2020-05-25 ENCOUNTER — Other Ambulatory Visit: Payer: Self-pay

## 2020-05-25 ENCOUNTER — Encounter: Payer: Self-pay | Admitting: Internal Medicine

## 2020-05-25 ENCOUNTER — Ambulatory Visit (INDEPENDENT_AMBULATORY_CARE_PROVIDER_SITE_OTHER): Payer: Medicare Other | Admitting: Internal Medicine

## 2020-05-25 VITALS — BP 120/74 | HR 93 | Temp 98.2°F | Resp 16 | Ht 61.0 in | Wt 126.4 lb

## 2020-05-25 DIAGNOSIS — Z1152 Encounter for screening for COVID-19: Secondary | ICD-10-CM | POA: Diagnosis not present

## 2020-05-25 DIAGNOSIS — J40 Bronchitis, not specified as acute or chronic: Secondary | ICD-10-CM | POA: Diagnosis not present

## 2020-05-25 LAB — POC COVID19 BINAXNOW: SARS Coronavirus 2 Ag: NEGATIVE

## 2020-05-25 MED ORDER — DEXAMETHASONE 4 MG PO TABS: ORAL_TABLET | ORAL | 0 refills | Status: DC

## 2020-05-25 MED ORDER — PROMETHAZINE-CODEINE 6.25-10 MG/5ML PO SYRP: ORAL_SOLUTION | ORAL | 1 refills | Status: DC

## 2020-05-25 MED ORDER — BENZONATATE 200 MG PO CAPS: ORAL_CAPSULE | ORAL | 1 refills | Status: DC

## 2020-05-25 MED ORDER — AZITHROMYCIN 250 MG PO TABS: ORAL_TABLET | ORAL | 1 refills | Status: DC

## 2020-05-25 NOTE — Progress Notes (Signed)
History of Present Illness:  Patient is a very nice 70 yo married man of middle [place] descent who presents with 2 week prodrome of chest congestion which seems getting worse, not better. Denies fever, chills, sweats , dyspnea or dyspnea. Reports sputum is thick yellow. Covid test negative today before entrance.  Medications  .  empagliflozin 25 MG TABS tablet, Take 1 tablet  daily  .  glimepiride 4 MG tablet, Take  1 tablet  2 x /day  with Meals  for Diabetes  .  metFORMIN 500 MG tablet, TAKE 2 TABLETS TWICE A DAY WITH MEALS   .  metoprolol tartrate  25 MG tablet, Take 1 tablet 2 x /day (every 12 hours) for BP  .  rosuvastatin  20 MG tablet, TAKE 1 TABLET EVERY DAY  .  telmisartan 20 MG tablet, TAKE 1 TABLET DAILY FOR BP & KIDNEY PROTECTION  .  aspirin EC 81 MG tablet, Take  daily  . VITAMIN D  5000 u ; Take 4  times a week.   .  Cinnamon 500 MG capsule, : Take daily  .  cyclobenzaprine  10 MG tablet, TAKE 1/2 TO 1 TABLET  3 TIMES A DAY AS NEEDED  .  MultiVitamin , Take 1 tablet  daily.  Problem list He has Hyperlipidemia associated with type 2 diabetes mellitus ([place]); Coronary atherosclerosis; Carotid stenosis; Essential hypertension; Vitamin D deficiency; Medication management; Encounter for Medicare annual wellness exam; Neuropathy, diabetic ([place]); DM type 2 (diabetes mellitus, type 2) ([place]); Poor compliance; Overweight (BMI 25.0-29.9); Diabetic retinopathy of both eyes ([place]); beta thalassemia minor (trait) anemia; Primary osteoarthritis of left knee; Aorto-iliac atherosclerosis ([place]); CKD stage 2 due to type 2 diabetes mellitus ([place]); and COVID-19 (03/31/2020) on their problem list.   Observations/Objective:  BP 120/74   Pulse 93   Temp 98.2 F (36.8 C)   Resp 16   Ht 5\' 1"  (1.549 m)   Wt 126 lb 6.4 oz (57.3 kg)   SpO2 98%   BMI 23.88 kg/m   Congested cough. Nor respiratory distress or stridor.  No cyanosis or pallor.  HEENT - WNL. Neck - supple. No JVD. Chest  - Scattered coarse inspir rales and coarse expiratory and                post tussive rhonchi. No wheezes.  Cor - Nl HS. RRR w/o sig MGR. PP 1(+). No edema. MS- FROM w/o deformities.  Gait Nl. Neuro -  Nl w/o focal abnormalities.  Assessment and Plan:  1. Bronchitis  - dexamethasone (DECADRON) 4 MG tablet; Take 1 tab 3 x /day for 2 days,      then 2 x /day for 2  Days,     then 1 tab daily  Dispense: 13 tablet; Refill: 0 - azithromycin (ZITHROMAX) 250 MG tablet; Take 2 tablets with Food on  Day 1, then 1 tablet Daily with Food for Sinusitis / Bronchitis  Dispense: 6 each; Refill: 1 - benzonatate (TESSALON) 200 MG capsule; Take 1 perle 3 x / day to prevent cough  Dispense: 30 capsule; Refill: 1 - promethazine-codeine (PHENERGAN WITH CODEINE) 6.25-10 MG/5ML syrup; Take 1 or 2 teaspoonful    4 x /day    as needed for Cough / Congestion  Dispense: 360 mL; Refill: 1  2. Encounter for screening for COVID-19  - POC COVID-19 BinaxNow  Follow Up Instructions:        I discussed the assessment and treatment plan with the patient. The  patient was provided an opportunity to ask questions and all were answered. The patient agreed with the plan and demonstrated an understanding of the instructions.       The patient was advised to call back or seek an in-person evaluation if the symptoms worsen or if the condition fails to improve as anticipated.   Kirtland Bouchard, MD

## 2020-05-29 ENCOUNTER — Other Ambulatory Visit: Payer: Self-pay | Admitting: Internal Medicine

## 2020-05-29 DIAGNOSIS — R059 Cough, unspecified: Secondary | ICD-10-CM

## 2020-05-30 ENCOUNTER — Telehealth: Payer: Self-pay | Admitting: *Deleted

## 2020-05-30 ENCOUNTER — Encounter: Payer: Medicare Other | Admitting: Physician Assistant

## 2020-05-30 NOTE — Telephone Encounter (Signed)
Left message for patient to return call regarding his cough.

## 2020-05-30 NOTE — Telephone Encounter (Signed)
Left message to inform patient, he stared the Z-pak on 05/25/2020 and will last 5 days beyond his completing it. Also, a cough can take up to 3 weeks to resolve. Patient advised he can refill Z-pak after 10 days from starting and Dr Melford Aase put in an order for patient to have a chest x-ray at [place] at [facility].

## 2020-06-04 NOTE — Progress Notes (Signed)
COMPLETE PHYSICAL  Assessment:   Encounter for routine medical examination with abnormal findings Yearly  Essential hypertension Continue current medications:lopressor 4m BID, telmisartan 231mdaily Monitor blood pressure at home; call if consistently over 130/80 Continue DASH diet.   Reminder to go to the ER if any CP, SOB, nausea, dizziness, severe HA, changes vision/speech, left arm numbness and tingling and jaw pain. -     CBC with Differential/Platelet -     CMP/GFR -     TSH  Atherosclerosis of native coronary artery of native heart without angina pectoris Control blood pressure, cholesterol, glucose, increase exercise.  -     Lipid panel -     Hemoglobin A1c  Stenosis of carotid artery, unspecified laterality Control blood pressure, cholesterol, glucose, increase exercise.  Followed by cardiology; moderate disease -     Lipid panel -     Hemoglobin A1c  Diabetic polyneuropathy associated with type 2 diabetes mellitus (HCC) Intermittent burning in bilateral feet, none recent, declines medications Control sugars, check feet daily -     Hemoglobin A1c  Type 2 diabetes mellitus with stage 2 chronic kidney disease, without long-term current use of insulin (HCC) Continue medications: Metformin 50059m tab BID with meals., Jardiance 61m63mily and glimepiride 4mg 49m. Discussed general issues about diabetes pathophysiology and management., Educational material distributed., Suggested low cholesterol diet., Encouraged aerobic exercise., Discussed foot care., Reminded to get yearly retinal exam- report for this year in system -     Hemoglobin A1c  Type 2 diabetes mellitus with diabetic retinopathy Control sugars, followed by Ophthalmology  Hyperlipidemia Continue medications: Rosuvastatin 20mg 41my Discussed dietary and exercise modifications Low fat diet -     Lipid panel  Anemia, beta thalassemia minor trait -     CBC with Differential/Platelet  Medication  management -     Magnesium  Vitamin D deficiency Continue vitamin D supplement 5,000IU daily Defer lab today  Former smoker, 34 pack year history, quit 2007 -CXR 12/2018- will repeat in 1 year  Dysuria -UA  Cough Persistent Discussed duration with patient related to cough being last to resolve. Related to productive nature -Chest X-ray today Discussed taking Mucinex DM Q12 hours while having symptoms Also add OTC cetirizine to help dry up drainage.  BMI 23.0-23.0 Contact office with any new or worsening symptoms    Future Appointments  Date Time Provider [email or web address]  9:30 AM CorbetLiane ComberAAM-[facility] None  06/05/2021 10:00 AM McClanGarnet SierrasAAM-[facility]IM None    Further disposition pending results if labs check today. Discussed med's effects and SE's.   Over 30 minutes of face to face interview, exam, counseling, chart review, and critical decision making was performed.     Subjective:  William Terin Cragle69 y.o71male who presents for medicare wellness and 3 month follow up for HTN, hyperlipidemia, diabetes, and vitamin D Def.    Patient was recently seen on 05/25/20 for bronchitis and treated with dexamethasone and azithromyacin, tessalon pearls and promethazine cough syrup for night.  Since then he reports he is still coughing at this time.  It is productive thick green in color.  He reports it is reduced from last week.  He continues to cough at night.  He reports when he starts coughing he is not able to stop.He tried tessalon pearls, reports this did not help.  He is using tylenol cough for this.    Patient was seen on 2/17 s/p fall out of bed.  Chest X-ray, rib fractures noted 5-7. Improved from this.  S/p left knee replacement with Dr. Berenice Primas, now have right knee pain and would like to have that knee replaced after he gets his kids taken care of with marriage's, daughter graduating doctorate of PT.  Reports he has two upcoming weddings in the  [place], his daughter who is goin to be moving to [place], [place] and Son who will live in the [place], [place] area.    BMI is Body mass index is 23.24 kg/m., he has been working on diet and exercise. Gets on stationary cycle 20-25 min daily.  Wt Readings from Last 3 Encounters:  06/05/20 123 lb (55.8 kg)  05/25/20 126 lb 6.4 oz (57.3 kg)  04/17/20 132 lb (59.9 kg)   He has a history of CAD s/p CABG in 2008, follows with Dr. Percival Spanish. He states he has had low energy for 1-2 months, he wakes up and feels well rested.  Lab Results  Component Value Date   IRON 62 12/10/2019   TIBC 365 12/10/2019   FERRITIN 38 02/26/2018   Lab Results  Component Value Date   [id number] 269 12/10/2019    Also follows for moderate bilateral carotid stenosis getting annual carotid US He has a history of smoking, Q 2008, 35 pack year, last CXR 12/2018 before surgery.  His blood pressure has been controlled at home (120/80), today their BP is BP: 128/88 He does workout.  He denies chest pain, shortness of breath, dizziness.   He has been working on diet and exercise for diabetes  with history of CAD/PAD ,he is on 75m ASA With hyperlipidemia on crestor 20 at goal less than 70 With CKD he is on micardis Retinopathy - 06/2018 + retinopathy HE IS DUE he is on metformin 2000 mg daily glimepiride 4 mg only on 1 a day  jardiance 10 mg daily He does check fasting glucose, has been better and running 120-130 or so. HBartholome Billreported parasthesias in the past but none recently  Denies foot ulcerations, hyperglycemia, polydipsia, polyuria and visual disturbances.  Last A1C in the office was:  Lab Results  Component Value Date   HGBA1C 9.0 (H) 12/10/2019   Lab Results  Component Value Date   GFRNONAA 72 02/21/2020   Patient is on Vitamin D supplement.   Lab Results  Component Value Date   VD25OH 38 09/07/2019        Medication Review:  Current Outpatient Medications (Endocrine & Metabolic):  .   empagliflozin (JARDIANCE) 25 MG TABS tablet, Take 1 tablet (25 mg total) by mouth daily. .[name] glimepiride (AMARYL) 4 MG tablet, Take  1 tablet  2 x /day  with Meals  for Diabetes .  metFORMIN (GLUCOPHAGE) 500 MG tablet, TAKE 2 TABLETS TWICE A DAY WITH MEALS FOR DIABETES  Current Outpatient Medications (Cardiovascular):  .  metoprolol tartrate (LOPRESSOR) 25 MG tablet, Take      1 tablet       2 x /day (every 12 hours)        for BP .  rosuvastatin (CRESTOR) 20 MG tablet, TAKE 1 TABLET BY MOUTH EVERY DAY FOR CHOLESTEROL .  telmisartan (MICARDIS) 20 MG tablet, TAKE 1 TABLET DAILY FOR BP & KIDNEY PROTECTION  Current Outpatient Medications (Respiratory):  .  benzonatate (TESSALON) 200 MG capsule, Take 1 perle 3 x / day to prevent cough .  promethazine-codeine (PHENERGAN WITH CODEINE) 6.25-10 MG/5ML syrup, Take 1 or 2 teaspoonful    4 x /day  as needed for Cough / Congestion  Current Outpatient Medications (Analgesics):  .  aspirin EC 81 MG tablet, Take 81 mg by mouth daily. Swallow whole.   Current Outpatient Medications (Other):  .  blood glucose meter kit and supplies, Test sugars once to twice daily .  Cholecalciferol (VITAMIN D3) 125 MCG (5000 UT) CAPS, Take 5,000 Units by mouth 4 (four) times a week.  .  Cinnamon 500 MG capsule, Take 2 capsules (1,000 mg total) by mouth 2 (two) times daily. (Patient taking differently: Take 500 mg by mouth daily.) .  cyclobenzaprine (FLEXERIL) 10 MG tablet, TAKE 1/2 TO 1 TABLET BY MOUTH 3 TIMES A DAY AS NEEDED FOR MUSCLE SPASMS .  Multiple Vitamin (MULTIVITAMIN) tablet, Take 1 tablet by mouth daily.  Current Problems (verified) Patient Active Problem List   Diagnosis Date Noted  . COVID-19 (03/31/2020) 04/03/2020  . CKD stage 2 due to type 2 diabetes mellitus ([place]) 02/11/2020  . Aorto-iliac atherosclerosis ([place]) 05/26/2019  . Primary osteoarthritis of left knee 12/18/2018  . beta thalassemia minor (trait) anemia 03/08/2018  . Overweight (BMI  25.0-29.9) 08/10/2017  . Diabetic retinopathy of both eyes ([place]) 08/10/2017  . Poor compliance 09/09/2015  . DM type 2 (diabetes mellitus, type 2) ([place]) 09/08/2015  . Neuropathy, diabetic ([place]) 05/19/2015  . Encounter for Medicare annual wellness exam 09/21/2014  . Essential hypertension 10/11/2013  . Vitamin D deficiency 10/11/2013  . Medication management 10/11/2013  . Hyperlipidemia associated with type 2 diabetes mellitus ([place]) 03/01/2010  . Coronary atherosclerosis 03/01/2010  . Carotid stenosis 03/01/2010   Allergies Allergies  Allergen Reactions  . Ace Inhibitors Cough  . Statins Other (See Comments)    Elevated LFT's      Screening Tests Immunization History  Administered Date(s) Administered  . DT (Pediatric) 02/07/2015  . Influenza Whole 12/10/2012  . Influenza, High Dose Seasonal PF 02/07/2015, 01/02/2016, 12/13/2016, 01/01/2018, 11/19/2018, 02/21/2020  . Influenza-Unspecified 12/31/2013  . Meningococcal Conjugate 09/03/2016  . PFIZER(Purple Top)SARS-COV-2 Vaccination 04/02/2019, 04/23/2019  . Pneumococcal Conjugate-13 02/02/2014  . Pneumococcal Polysaccharide-23 04/13/2003, 10/08/2016  . Td 04/18/2004   Health Maintenance  Topic Date Due  . COVID-19 Vaccine (3 - Booster for Pfizer series) 10/21/2019  . FOOT EXAM  11/19/2019  . HEMOGLOBIN A1C  06/09/2020  . OPHTHALMOLOGY EXAM  05/10/2021  . COLONOSCOPY (Pts 45-61yr Insurance coverage will need to be confirmed)  08/22/2022  . TETANUS/TDAP  02/06/2025  . INFLUENZA VACCINE  Completed  . Hepatitis C Screening  Completed  . PNA vac Low Risk Adult  Completed  . HPV VACCINES  Aged Out   DEXA: declines Colonoscopy: 2014 due 2024  CXr 12/2018 has 34 pack year smoking hx, quit in 2007   EGD:N/A Echo 01/2008 Stress test 2013 MRI head 03/2007 Carotid [place] 08/14/2018 - by Dr. HPercival Spanish  Names of Other Physician/Practitioners you currently use: 1. [facility] here for  primary care 2. [facility], Dr. wClydene Laming eye doctor, last visit 06/2019, due in 06/2020 3. Dr. CBurt Knack dentist, last visit several years ago - has full dentures  Patient Care Team: MUnk Pinto MD as PCP - General (Internal Medicine) HMinus Breeding MD as PCP - Cardiology (Cardiology) WKeene Breath, MD as Consulting Physician (Ophthalmology) HMinus Breeding MD as Consulting Physician (Cardiology) MClarene Essex MD as Consulting Physician (Gastroenterology) GDorna Leitz MD as Consulting Physician (Orthopedic Surgery)   SURGICAL HISTORY He  has a past surgical history that includes Coronary artery bypass graft (2008) and Total knee  arthroplasty (Left, 12/18/2018). FAMILY HISTORY His family history includes Diabetes in his sister; Heart attack in his father; Heart disease in his brother and father; Hyperlipidemia in his father; Hypertension in his father. SOCIAL HISTORY He  reports that he quit smoking about 14 years ago. He started smoking about 49 years ago. He has a 34.00 pack-year smoking history. He has never used smokeless tobacco. He reports that he does not drink alcohol and does not use drugs.   Objective:   Blood pressure 128/88, pulse 100, temperature (!) 97.3 F (36.3 C), weight 123 lb (55.8 kg), SpO2 97 %. Body mass index is 23.24 kg/m.  General appearance: alert, no distress, WD/WN, male HEENT: normocephalic, sclerae anicteric, TMs pearly, nares patent, no discharge or erythema, pharynx normal Oral cavity: MMM, no lesions Neck: supple, no lymphadenopathy, no thyromegaly, no masses Heart: RRR, normal S1, S2, no murmurs Lungs: CTA bilaterally, no wheezes, rhonchi, or rales Abdomen: +bs, soft, non tender, non distended, no masses, no hepatomegaly, no splenomegaly Musculoskeletal: nontender, no swelling, varus deformity, antalgic gait, well healing vertical left knee scar , + crepitus right knee.  Extremities: no edema, no cyanosis, no clubbing Pulses: 2+ symmetric,  upper and lower extremities, normal cap refill Neurological: alert, oriented x 3, CN2-12 intact, strength normal upper extremities and lower extremities, sensation abnormal bilateral feet, DTRs 2+ throughout, no cerebellar signs Psychiatric: normal affect, behavior normal, pleasant    Bayard Males, DNP [facility] 06/05/2020  3:04 PM

## 2020-06-05 ENCOUNTER — Other Ambulatory Visit: Payer: Self-pay

## 2020-06-05 ENCOUNTER — Encounter: Payer: Self-pay | Admitting: Adult Health Nurse Practitioner

## 2020-06-05 ENCOUNTER — Ambulatory Visit
Admission: RE | Admit: 2020-06-05 | Discharge: 2020-06-05 | Disposition: A | Discharge status: Home / Self Care | Payer: Medicare Other | Source: Ambulatory Visit | Attending: Internal Medicine | Admitting: Internal Medicine

## 2020-06-05 ENCOUNTER — Ambulatory Visit (INDEPENDENT_AMBULATORY_CARE_PROVIDER_SITE_OTHER): Payer: Medicare Other | Admitting: Adult Health Nurse Practitioner

## 2020-06-05 VITALS — BP 128/88 | HR 100 | Temp 97.3°F | Wt 123.0 lb

## 2020-06-05 DIAGNOSIS — E1142 Type 2 diabetes mellitus with diabetic polyneuropathy: Secondary | ICD-10-CM

## 2020-06-05 DIAGNOSIS — N182 Chronic kidney disease, stage 2 (mild): Secondary | ICD-10-CM

## 2020-06-05 DIAGNOSIS — E1169 Type 2 diabetes mellitus with other specified complication: Secondary | ICD-10-CM | POA: Diagnosis not present

## 2020-06-05 DIAGNOSIS — R300 Dysuria: Secondary | ICD-10-CM

## 2020-06-05 DIAGNOSIS — I700 Atherosclerosis of aorta: Secondary | ICD-10-CM | POA: Diagnosis not present

## 2020-06-05 DIAGNOSIS — I10 Essential (primary) hypertension: Secondary | ICD-10-CM | POA: Diagnosis not present

## 2020-06-05 DIAGNOSIS — Z0000 Encounter for general adult medical examination without abnormal findings: Secondary | ICD-10-CM

## 2020-06-05 DIAGNOSIS — I6523 Occlusion and stenosis of bilateral carotid arteries: Secondary | ICD-10-CM

## 2020-06-05 DIAGNOSIS — D509 Iron deficiency anemia, unspecified: Secondary | ICD-10-CM

## 2020-06-05 DIAGNOSIS — E559 Vitamin D deficiency, unspecified: Secondary | ICD-10-CM

## 2020-06-05 DIAGNOSIS — Z0001 Encounter for general adult medical examination with abnormal findings: Secondary | ICD-10-CM

## 2020-06-05 DIAGNOSIS — Z6823 Body mass index (BMI) 23.0-23.9, adult: Secondary | ICD-10-CM

## 2020-06-05 DIAGNOSIS — R059 Cough, unspecified: Secondary | ICD-10-CM | POA: Diagnosis not present

## 2020-06-05 DIAGNOSIS — Z79899 Other long term (current) drug therapy: Secondary | ICD-10-CM

## 2020-06-05 DIAGNOSIS — I708 Atherosclerosis of other arteries: Secondary | ICD-10-CM

## 2020-06-05 DIAGNOSIS — E785 Hyperlipidemia, unspecified: Secondary | ICD-10-CM | POA: Diagnosis not present

## 2020-06-05 DIAGNOSIS — E1122 Type 2 diabetes mellitus with diabetic chronic kidney disease: Secondary | ICD-10-CM | POA: Diagnosis not present

## 2020-06-05 DIAGNOSIS — Z136 Encounter for screening for cardiovascular disorders: Secondary | ICD-10-CM

## 2020-06-06 LAB — CBC WITH DIFFERENTIAL/PLATELET
Absolute Monocytes: 950 cells/uL (ref 200–950)
Basophils Absolute: 88 cells/uL (ref 0–200)
Basophils Relative: 0.7 %
Eosinophils Absolute: 350 cells/uL (ref 15–500)
Eosinophils Relative: 2.8 %
HCT: 39.9 % (ref 38.5–50.0)
Hemoglobin: 11.8 g/dL — ABNORMAL LOW (ref 13.2–17.1)
Lymphs Abs: 1500 cells/uL (ref 850–3900)
MCH: 19.7 pg — ABNORMAL LOW (ref 27.0–33.0)
MCHC: 29.6 g/dL — ABNORMAL LOW (ref 32.0–36.0)
MCV: 66.6 fL — ABNORMAL LOW (ref 80.0–100.0)
MPV: 11.5 fL (ref 7.5–12.5)
Monocytes Relative: 7.6 %
Neutro Abs: 9613 cells/uL — ABNORMAL HIGH (ref 1500–7800)
Neutrophils Relative %: 76.9 %
Platelets: 561 10*3/uL — ABNORMAL HIGH (ref 140–400)
RBC: 5.99 10*6/uL — ABNORMAL HIGH (ref 4.20–5.80)
RDW: 17.9 % — ABNORMAL HIGH (ref 11.0–15.0)
Total Lymphocyte: 12 %
WBC: 12.5 10*3/uL — ABNORMAL HIGH (ref 3.8–10.8)

## 2020-06-06 LAB — URINALYSIS W MICROSCOPIC + REFLEX CULTURE
Bacteria, UA: NONE SEEN /HPF
Bilirubin Urine: NEGATIVE
Hgb urine dipstick: NEGATIVE
Hyaline Cast: NONE SEEN /LPF
Ketones, ur: NEGATIVE
Leukocyte Esterase: NEGATIVE
Nitrites, Initial: NEGATIVE
Protein, ur: NEGATIVE
RBC / HPF: NONE SEEN /HPF (ref 0–2)
Specific Gravity, Urine: 1.04 — ABNORMAL HIGH (ref 1.001–1.03)
Squamous Epithelial / HPF: NONE SEEN /HPF (ref <=5)
WBC, UA: NONE SEEN /HPF (ref 0–5)
pH: 5.5 (ref 5.0–8.0)

## 2020-06-06 LAB — COMPLETE METABOLIC PANEL WITH GFR
AG Ratio: 1.2 (calc) (ref 1.0–2.5)
ALT: 12 U/L (ref 9–46)
AST: 11 U/L (ref 10–35)
Albumin: 3.8 g/dL (ref 3.6–5.1)
Alkaline phosphatase (APISO): 78 U/L (ref 35–144)
BUN: 14 mg/dL (ref 7–25)
CO2: 27 mmol/L (ref 20–32)
Calcium: 9.6 mg/dL (ref 8.6–10.3)
Chloride: 102 mmol/L (ref 98–110)
Creat: 1 mg/dL (ref 0.70–1.18)
GFR, Est African American: 87 mL/min/{1.73_m2} (ref >=60)
GFR, Est Non African American: 75 mL/min/{1.73_m2} (ref >=60)
Globulin: 3.1 g/dL (calc) (ref 1.9–3.7)
Glucose, Bld: 184 mg/dL — ABNORMAL HIGH (ref 65–99)
Potassium: 5.4 mmol/L — ABNORMAL HIGH (ref 3.5–5.3)
Sodium: 138 mmol/L (ref 135–146)
Total Bilirubin: 0.5 mg/dL (ref 0.2–1.2)
Total Protein: 6.9 g/dL (ref 6.1–8.1)

## 2020-06-06 LAB — LIPID PANEL
Cholesterol: 142 mg/dL (ref <200)
HDL: 39 mg/dL — ABNORMAL LOW (ref >=40)
LDL Cholesterol (Calc): 82 mg/dL (calc)
Non-HDL Cholesterol (Calc): 103 mg/dL (calc) (ref <130)
Total CHOL/HDL Ratio: 3.6 (calc) (ref <5.0)
Triglycerides: 114 mg/dL (ref <150)

## 2020-06-06 LAB — HEMOGLOBIN A1C
Hgb A1c MFr Bld: 10.3 % of total Hgb — ABNORMAL HIGH (ref <5.7)
Mean Plasma Glucose: 249 mg/dL
eAG (mmol/L): 13.8 mmol/L

## 2020-06-06 LAB — CBC MORPHOLOGY

## 2020-06-06 LAB — TSH: TSH: 1.41 mIU/L (ref 0.40–4.50)

## 2020-06-06 LAB — NO CULTURE INDICATED

## 2020-06-08 ENCOUNTER — Encounter (HOSPITAL_COMMUNITY): Payer: Self-pay | Admitting: Internal Medicine

## 2020-06-08 ENCOUNTER — Observation Stay (HOSPITAL_BASED_OUTPATIENT_CLINIC_OR_DEPARTMENT_OTHER): Payer: Medicare Other

## 2020-06-08 ENCOUNTER — Other Ambulatory Visit: Payer: Self-pay

## 2020-06-08 ENCOUNTER — Observation Stay (HOSPITAL_COMMUNITY): Payer: Medicare Other

## 2020-06-08 ENCOUNTER — Emergency Department (HOSPITAL_COMMUNITY): Payer: Medicare Other

## 2020-06-08 ENCOUNTER — Inpatient Hospital Stay (HOSPITAL_COMMUNITY)
Admission: EM | Admit: 2020-06-08 | Discharge: 2020-06-10 | DRG: 637 | Disposition: A | Discharge status: Home / Self Care | Payer: Medicare Other | Attending: Family Medicine | Admitting: Family Medicine

## 2020-06-08 ENCOUNTER — Telehealth: Payer: Self-pay

## 2020-06-08 DIAGNOSIS — I1310 Hypertensive heart and chronic kidney disease without heart failure, with stage 1 through stage 4 chronic kidney disease, or unspecified chronic kidney disease: Secondary | ICD-10-CM | POA: Diagnosis present

## 2020-06-08 DIAGNOSIS — R9401 Abnormal electroencephalogram [EEG]: Secondary | ICD-10-CM | POA: Diagnosis present

## 2020-06-08 DIAGNOSIS — E119 Type 2 diabetes mellitus without complications: Secondary | ICD-10-CM

## 2020-06-08 DIAGNOSIS — I2510 Atherosclerotic heart disease of native coronary artery without angina pectoris: Secondary | ICD-10-CM | POA: Diagnosis present

## 2020-06-08 DIAGNOSIS — Z79899 Other long term (current) drug therapy: Secondary | ICD-10-CM

## 2020-06-08 DIAGNOSIS — E7220 Disorder of urea cycle metabolism, unspecified: Secondary | ICD-10-CM | POA: Diagnosis not present

## 2020-06-08 DIAGNOSIS — E1169 Type 2 diabetes mellitus with other specified complication: Secondary | ICD-10-CM | POA: Diagnosis not present

## 2020-06-08 DIAGNOSIS — Z83438 Family history of other disorder of lipoprotein metabolism and other lipidemia: Secondary | ICD-10-CM | POA: Diagnosis not present

## 2020-06-08 DIAGNOSIS — N182 Chronic kidney disease, stage 2 (mild): Secondary | ICD-10-CM | POA: Diagnosis present

## 2020-06-08 DIAGNOSIS — I6523 Occlusion and stenosis of bilateral carotid arteries: Secondary | ICD-10-CM | POA: Diagnosis not present

## 2020-06-08 DIAGNOSIS — Z7984 Long term (current) use of oral hypoglycemic drugs: Secondary | ICD-10-CM

## 2020-06-08 DIAGNOSIS — G9341 Metabolic encephalopathy: Secondary | ICD-10-CM | POA: Diagnosis present

## 2020-06-08 DIAGNOSIS — D320 Benign neoplasm of cerebral meninges: Secondary | ICD-10-CM | POA: Diagnosis not present

## 2020-06-08 DIAGNOSIS — E440 Moderate protein-calorie malnutrition: Secondary | ICD-10-CM | POA: Insufficient documentation

## 2020-06-08 DIAGNOSIS — I249 Acute ischemic heart disease, unspecified: Secondary | ICD-10-CM | POA: Diagnosis present

## 2020-06-08 DIAGNOSIS — E1122 Type 2 diabetes mellitus with diabetic chronic kidney disease: Secondary | ICD-10-CM | POA: Diagnosis not present

## 2020-06-08 DIAGNOSIS — Z96652 Presence of left artificial knee joint: Secondary | ICD-10-CM | POA: Diagnosis not present

## 2020-06-08 DIAGNOSIS — E1165 Type 2 diabetes mellitus with hyperglycemia: Secondary | ICD-10-CM | POA: Diagnosis not present

## 2020-06-08 DIAGNOSIS — I639 Cerebral infarction, unspecified: Secondary | ICD-10-CM | POA: Diagnosis not present

## 2020-06-08 DIAGNOSIS — R410 Disorientation, unspecified: Secondary | ICD-10-CM | POA: Diagnosis present

## 2020-06-08 DIAGNOSIS — Z20822 Contact with and (suspected) exposure to covid-19: Secondary | ICD-10-CM | POA: Diagnosis not present

## 2020-06-08 DIAGNOSIS — Z833 Family history of diabetes mellitus: Secondary | ICD-10-CM | POA: Diagnosis not present

## 2020-06-08 DIAGNOSIS — Z7982 Long term (current) use of aspirin: Secondary | ICD-10-CM

## 2020-06-08 DIAGNOSIS — I255 Ischemic cardiomyopathy: Secondary | ICD-10-CM | POA: Diagnosis present

## 2020-06-08 DIAGNOSIS — E1151 Type 2 diabetes mellitus with diabetic peripheral angiopathy without gangrene: Secondary | ICD-10-CM | POA: Diagnosis present

## 2020-06-08 DIAGNOSIS — Z951 Presence of aortocoronary bypass graft: Secondary | ICD-10-CM

## 2020-06-08 DIAGNOSIS — Z87891 Personal history of nicotine dependence: Secondary | ICD-10-CM

## 2020-06-08 DIAGNOSIS — G459 Transient cerebral ischemic attack, unspecified: Secondary | ICD-10-CM

## 2020-06-08 DIAGNOSIS — S2231XA Fracture of one rib, right side, initial encounter for closed fracture: Secondary | ICD-10-CM | POA: Diagnosis not present

## 2020-06-08 DIAGNOSIS — Z8616 Personal history of COVID-19: Secondary | ICD-10-CM | POA: Diagnosis not present

## 2020-06-08 DIAGNOSIS — G928 Other toxic encephalopathy: Secondary | ICD-10-CM | POA: Diagnosis present

## 2020-06-08 DIAGNOSIS — I499 Cardiac arrhythmia, unspecified: Secondary | ICD-10-CM | POA: Diagnosis not present

## 2020-06-08 DIAGNOSIS — Z8249 Family history of ischemic heart disease and other diseases of the circulatory system: Secondary | ICD-10-CM | POA: Diagnosis not present

## 2020-06-08 DIAGNOSIS — R404 Transient alteration of awareness: Secondary | ICD-10-CM | POA: Diagnosis not present

## 2020-06-08 DIAGNOSIS — I10 Essential (primary) hypertension: Secondary | ICD-10-CM | POA: Diagnosis present

## 2020-06-08 DIAGNOSIS — I6501 Occlusion and stenosis of right vertebral artery: Secondary | ICD-10-CM | POA: Diagnosis not present

## 2020-06-08 DIAGNOSIS — R531 Weakness: Secondary | ICD-10-CM

## 2020-06-08 DIAGNOSIS — E785 Hyperlipidemia, unspecified: Secondary | ICD-10-CM | POA: Diagnosis not present

## 2020-06-08 DIAGNOSIS — I6529 Occlusion and stenosis of unspecified carotid artery: Secondary | ICD-10-CM | POA: Diagnosis present

## 2020-06-08 DIAGNOSIS — I6622 Occlusion and stenosis of left posterior cerebral artery: Secondary | ICD-10-CM | POA: Diagnosis not present

## 2020-06-08 DIAGNOSIS — R32 Unspecified urinary incontinence: Secondary | ICD-10-CM

## 2020-06-08 DIAGNOSIS — Z743 Need for continuous supervision: Secondary | ICD-10-CM | POA: Diagnosis not present

## 2020-06-08 DIAGNOSIS — R059 Cough, unspecified: Secondary | ICD-10-CM | POA: Diagnosis not present

## 2020-06-08 DIAGNOSIS — N281 Cyst of kidney, acquired: Secondary | ICD-10-CM | POA: Diagnosis not present

## 2020-06-08 HISTORY — DX: Metabolic encephalopathy: G93.41

## 2020-06-08 LAB — COMPREHENSIVE METABOLIC PANEL
ALT: 12 U/L (ref 0–44)
AST: 10 U/L — ABNORMAL LOW (ref 15–41)
Albumin: 2.9 g/dL — ABNORMAL LOW (ref 3.5–5.0)
Alkaline Phosphatase: 75 U/L (ref 38–126)
Anion gap: 7 (ref 5–15)
BUN: 18 mg/dL (ref 8–23)
CO2: 23 mmol/L (ref 22–32)
Calcium: 8.9 mg/dL (ref 8.9–10.3)
Chloride: 105 mmol/L (ref 98–111)
Creatinine, Ser: 1.06 mg/dL (ref 0.61–1.24)
GFR, Estimated: >60 mL/min (ref >60)
Glucose, Bld: 363 mg/dL — ABNORMAL HIGH (ref 70–99)
Potassium: 4.5 mmol/L (ref 3.5–5.1)
Sodium: 135 mmol/L (ref 135–145)
Total Bilirubin: 0.5 mg/dL (ref 0.3–1.2)
Total Protein: 6.1 g/dL — ABNORMAL LOW (ref 6.5–8.1)

## 2020-06-08 LAB — URINALYSIS, ROUTINE W REFLEX MICROSCOPIC
Bacteria, UA: NONE SEEN
Bilirubin Urine: NEGATIVE
Glucose, UA: >=500 mg/dL — AB
Hgb urine dipstick: NEGATIVE
Ketones, ur: 5 mg/dL — AB
Leukocytes,Ua: NEGATIVE
Nitrite: NEGATIVE
Protein, ur: NEGATIVE mg/dL
Specific Gravity, Urine: 1.028 (ref 1.005–1.030)
pH: 5 (ref 5.0–8.0)

## 2020-06-08 LAB — CBC WITH DIFFERENTIAL/PLATELET
Abs Immature Granulocytes: 0.14 10*3/uL — ABNORMAL HIGH (ref 0.00–0.07)
Basophils Absolute: 0.1 10*3/uL (ref 0.0–0.1)
Basophils Relative: 1 %
Eosinophils Absolute: 0.2 10*3/uL (ref 0.0–0.5)
Eosinophils Relative: 2 %
HCT: 34.5 % — ABNORMAL LOW (ref 39.0–52.0)
Hemoglobin: 10.6 g/dL — ABNORMAL LOW (ref 13.0–17.0)
Immature Granulocytes: 1 %
Lymphocytes Relative: 11 %
Lymphs Abs: 1.2 10*3/uL (ref 0.7–4.0)
MCH: 19.9 pg — ABNORMAL LOW (ref 26.0–34.0)
MCHC: 30.7 g/dL (ref 30.0–36.0)
MCV: 64.6 fL — ABNORMAL LOW (ref 80.0–100.0)
Monocytes Absolute: 1 10*3/uL (ref 0.1–1.0)
Monocytes Relative: 9 %
Neutro Abs: 8.2 10*3/uL — ABNORMAL HIGH (ref 1.7–7.7)
Neutrophils Relative %: 76 %
Platelets: 435 10*3/uL — ABNORMAL HIGH (ref 150–400)
RBC: 5.34 MIL/uL (ref 4.22–5.81)
RDW: 17.8 % — ABNORMAL HIGH (ref 11.5–15.5)
WBC: 10.8 10*3/uL — ABNORMAL HIGH (ref 4.0–10.5)
nRBC: 0 % (ref 0.0–0.2)

## 2020-06-08 LAB — ECHOCARDIOGRAM COMPLETE
Calc EF: 46.4 %
Height: 61 in
P 1/2 time: 201 msec
S' Lateral: 4.1 cm
Single Plane A2C EF: 52.4 %
Single Plane A4C EF: 40.3 %
Weight: 1967.98 oz

## 2020-06-08 LAB — HIV ANTIBODY (ROUTINE TESTING W REFLEX): HIV Screen 4th Generation wRfx: NONREACTIVE

## 2020-06-08 LAB — CBG MONITORING, ED
Glucose-Capillary: 324 mg/dL — ABNORMAL HIGH (ref 70–99)
Glucose-Capillary: 348 mg/dL — ABNORMAL HIGH (ref 70–99)
Glucose-Capillary: 185 mg/dL — ABNORMAL HIGH (ref 70–99)

## 2020-06-08 LAB — GLUCOSE, CAPILLARY
Glucose-Capillary: 175 mg/dL — ABNORMAL HIGH (ref 70–99)
Glucose-Capillary: 144 mg/dL — ABNORMAL HIGH (ref 70–99)

## 2020-06-08 LAB — FOLATE: Folate: 39.7 ng/mL (ref >5.9)

## 2020-06-08 LAB — HEMOGLOBIN A1C
Hgb A1c MFr Bld: 10.3 % — ABNORMAL HIGH (ref 4.8–5.6)
Mean Plasma Glucose: 248.91 mg/dL

## 2020-06-08 LAB — VITAMIN B12: Vitamin B-12: 334 pg/mL (ref 180–914)

## 2020-06-08 LAB — SARS CORONAVIRUS 2 (TAT 6-24 HRS): SARS Coronavirus 2: NEGATIVE

## 2020-06-08 LAB — TSH: TSH: 1.809 u[IU]/mL (ref 0.350–4.500)

## 2020-06-08 MED ORDER — ACETAMINOPHEN 325 MG PO TABS
650.0000 mg | ORAL_TABLET | ORAL | Status: DC | PRN
  Administered 2020-06-08: 650 mg via ORAL
  Filled 2020-06-08: qty 2

## 2020-06-08 MED ORDER — CYCLOBENZAPRINE HCL 10 MG PO TABS: 5.0000 mg | ORAL_TABLET | Freq: Three times a day (TID) | ORAL | Status: DC | PRN

## 2020-06-08 MED ORDER — SODIUM CHLORIDE 0.9 % IV SOLN: INTRAVENOUS | Status: DC

## 2020-06-08 MED ORDER — ROSUVASTATIN CALCIUM 20 MG PO TABS
20.0000 mg | ORAL_TABLET | Freq: Every day | ORAL | Status: DC
  Administered 2020-06-08 – 2020-06-10 (×3): 20 mg via ORAL
  Filled 2020-06-08 (×3): qty 1

## 2020-06-08 MED ORDER — THIAMINE HCL 100 MG/ML IJ SOLN: 100.0000 mg | Freq: Every day | INTRAMUSCULAR | Status: DC

## 2020-06-08 MED ORDER — ACETAMINOPHEN 650 MG RE SUPP: 650.0000 mg | RECTAL | Status: DC | PRN

## 2020-06-08 MED ORDER — INSULIN GLARGINE 100 UNIT/ML ~~LOC~~ SOLN
10.0000 [IU] | Freq: Every day | SUBCUTANEOUS | Status: DC
  Administered 2020-06-08 – 2020-06-09 (×2): 10 [IU] via SUBCUTANEOUS
  Filled 2020-06-08 (×3): qty 0.1

## 2020-06-08 MED ORDER — THIAMINE HCL 100 MG/ML IJ SOLN
500.0000 mg | Freq: Three times a day (TID) | INTRAVENOUS | Status: DC
  Administered 2020-06-08 – 2020-06-10 (×5): 500 mg via INTRAVENOUS
  Filled 2020-06-08 (×6): qty 5

## 2020-06-08 MED ORDER — SENNOSIDES-DOCUSATE SODIUM 8.6-50 MG PO TABS: 1.0000 | ORAL_TABLET | Freq: Every evening | ORAL | Status: DC | PRN

## 2020-06-08 MED ORDER — GADOBUTROL 1 MMOL/ML IV SOLN
5.5000 mL | Freq: Once | INTRAVENOUS | Status: AC | PRN
  Administered 2020-06-08: 5.5 mL via INTRAVENOUS

## 2020-06-08 MED ORDER — INSULIN ASPART 100 UNIT/ML ~~LOC~~ SOLN
0.0000 [IU] | Freq: Three times a day (TID) | SUBCUTANEOUS | Status: DC
  Administered 2020-06-08: 3 [IU] via SUBCUTANEOUS
  Administered 2020-06-09: 2 [IU] via SUBCUTANEOUS
  Administered 2020-06-09 – 2020-06-10 (×3): 3 [IU] via SUBCUTANEOUS

## 2020-06-08 MED ORDER — THIAMINE HCL 100 MG/ML IJ SOLN: 250.0000 mg | Freq: Every day | INTRAVENOUS | Status: DC

## 2020-06-08 MED ORDER — ENOXAPARIN SODIUM 40 MG/0.4ML ~~LOC~~ SOLN
40.0000 mg | Freq: Every day | SUBCUTANEOUS | Status: DC
  Administered 2020-06-08 – 2020-06-09 (×2): 40 mg via SUBCUTANEOUS
  Filled 2020-06-08 (×3): qty 0.4

## 2020-06-08 MED ORDER — STROKE: EARLY STAGES OF RECOVERY BOOK: Freq: Once | Status: DC

## 2020-06-08 MED ORDER — SODIUM CHLORIDE 0.9 % IV BOLUS
500.0000 mL | Freq: Once | INTRAVENOUS | Status: AC
  Administered 2020-06-08: 500 mL via INTRAVENOUS

## 2020-06-08 MED ORDER — ACETAMINOPHEN 160 MG/5ML PO SOLN: 650.0000 mg | ORAL | Status: DC | PRN

## 2020-06-08 MED ORDER — ASPIRIN EC 81 MG PO TBEC
81.0000 mg | DELAYED_RELEASE_TABLET | Freq: Every day | ORAL | Status: DC
  Administered 2020-06-08 – 2020-06-10 (×3): 81 mg via ORAL
  Filled 2020-06-08 (×3): qty 1

## 2020-06-08 NOTE — Plan of Care (Signed)
  Problem: Education: Goal: Knowledge of disease or condition will improve Outcome: Progressing Goal: Knowledge of secondary prevention will improve Outcome: Progressing Goal: Knowledge of patient specific risk factors addressed and post discharge goals established will improve Outcome: Progressing   

## 2020-06-08 NOTE — ED Notes (Signed)
Pt transported to MRI 

## 2020-06-08 NOTE — Discharge Instructions (Addendum)
Discontinue cold medications. Return to the ER for new, worsening, concerning symptoms. Follow up with your doctor.

## 2020-06-08 NOTE — ED Notes (Signed)
Pt back from x-ray.

## 2020-06-08 NOTE — ED Notes (Signed)
Patient transported to X-ray 

## 2020-06-08 NOTE — ED Notes (Signed)
Pt transported to Echo. 

## 2020-06-08 NOTE — Progress Notes (Signed)
Carotid duplex has been completed.   Preliminary results in CV Proc.   Abram Sander 06/08/2020 3:16 PM

## 2020-06-08 NOTE — H&P (Signed)
History and Physical    William Haney OXB:[id number] DOB: 1951-01-26 DOA: 06/08/2020  PCP: Unk Pinto, MD Consultants:  Berenice Primas - orthopedics; [place]och[place] - cardiology; [place] - GI Patient coming from:  Home - lives with wife and children; NOK: Wife, [phone]  Chief Complaint: AMS  HPI: William Haney is a 70 y.o. male with medical history significant of HTN; HLD; DM; PVD with carotid stenosis; and CAD s/p CABG presenting with AMS.  He has had a cold for maybe a couple of months.  He has been coughing.  He had COVID in January (test + in PCP office).  The last couple of days he has been delirious, not acting like himself, wandering around the house and asking questions.  Last night, in and out of the bathroom a lot - abnormal for him.  Checked BP and tried to check glucose.  He saw his PCP Monday and they did blood work and EKG.  All normal except glucose; A1c was 10 and he was prescribed insulin but has not started yet.   No clear speech difficulty.  No N/W/T.  His children are PT and optometrist and have been looking for stroke symptoms.  Mostly polyuria, slowed speech, and confusion as primary symptoms.  He was seen by his PCP on 3/17 for chest congestion.  He was diagnosed with bronchitis and treated with Azithromycin, Dexamethasone, Tessalon Perles, and Phenergan with Codeine.    ED Course:   Confused this AM upon awakening - peed on floor, naked.  Daughter is PT and said he is slow to answer questions since yesterday but otherwise back to baseline. Son says gradual decline since COVID in January, but clearly different.  Head CT with ?CVA, suggest MRI.  Review of Systems: As per HPI; otherwise review of systems reviewed and negative.   Ambulatory Status:  Ambulates without assistance or with a cane  COVID Vaccine Status:   Complete  Past Medical History:  Diagnosis Date  . Anemia   . Carotid artery stenosis 01/2017   L-ICA 1-39%, R-ICA 40-59% doppler  . Coronary artery disease     s/p CABG in 2008, w/ LIMA-LAD, SVG-Diag, SVG-OM, SVG-PL  . Diabetes mellitus type II   . Hyperlipidemia   . Hypertension   . Vitamin D deficiency     Past Surgical History:  Procedure Laterality Date  . CORONARY ARTERY BYPASS GRAFT  2008   LIMA to the LAD, saphenous vein graft to diagonal, saphenous vein graft to OM, saphenous vein graft to posterolateral 2008  . TOTAL KNEE ARTHROPLASTY Left 12/18/2018   Procedure: TOTAL KNEE ARTHROPLASTY;  Surgeon: Dorna Leitz, MD;  Location: [facility];  Service: Orthopedics;  Laterality: Left;    Social History   Socioeconomic History  . Marital status: Married    Spouse name: Not on file  . Number of children: Not on file  . Years of education: Not on file  . Highest education level: Not on file  Occupational History  . Occupation: retired  Tobacco Use  . Smoking status: Former Smoker    Packs/day: 1.00    Years: 34.00    Pack years: 34.00    Start date: 61    Quit date: 08/28/2005    Years since quitting: 14.7  . Smokeless tobacco: Never Used  Vaping Use  . Vaping Use: Never used  Substance and Sexual Activity  . Alcohol use: No  . Drug use: No  . Sexual activity: Yes  Other Topics Concern  . Not on file  Social History Narrative  . Not on file   Social Determinants of Health   Financial Resource Strain: Not on file  Food Insecurity: Not on file  Transportation Needs: Not on file  Physical Activity: Not on file  Stress: Not on file  Social Connections: Not on file  Intimate Partner Violence: Not on file    Allergies  Allergen Reactions  . Ace Inhibitors Cough  . Statins Other (See Comments)    Elevated LFT's    Family History  Problem Relation Age of Onset  . Hypertension Father   . Heart disease Father   . Hyperlipidemia Father   . Heart attack Father   . Diabetes Sister   . Heart disease Brother     Prior to Admission medications   Medication Sig Start Date End Date Taking? Authorizing Provider  aspirin  EC 81 MG tablet Take 81 mg by mouth daily. Swallow whole.   Yes [provider]  blood glucose meter kit and supplies Test sugars once to twice daily 08/24/18  Yes Vladimir Crofts, PA-C  Cholecalciferol (VITAMIN D3) 125 MCG (5000 UT) CAPS Take 5,000 Units by mouth daily.   Yes [provider]  Cinnamon 500 MG capsule Take 2 capsules (1,000 mg total) by mouth 2 (two) times daily. Patient taking differently: Take 500 mg by mouth daily. 02/05/17  Yes Liane Comber, NP  cyclobenzaprine (FLEXERIL) 10 MG tablet TAKE 1/2 TO 1 TABLET BY MOUTH 3 TIMES A DAY AS NEEDED FOR MUSCLE SPASMS Patient taking differently: Take 5-10 mg by mouth 3 (three) times daily as needed for muscle spasms. 03/28/20  Yes McClanahan, Danton Sewer, NP  empagliflozin (JARDIANCE) 25 MG TABS tablet Take 1 tablet (25 mg total) by mouth daily. 02/21/20  Yes Liane Comber, NP  glimepiride (AMARYL) 4 MG tablet Take  1 tablet  2 x /day  with Meals  for Diabetes Patient taking differently: Take 4 mg by mouth in the morning and at bedtime. 05/06/20  Yes Unk Pinto, MD  metFORMIN (GLUCOPHAGE) 500 MG tablet TAKE 2 TABLETS TWICE A DAY WITH MEALS FOR DIABETES Patient taking differently: Take 1,000 mg by mouth 2 (two) times daily with a meal. 02/22/20  Yes Unk Pinto, MD  metoprolol tartrate (LOPRESSOR) 25 MG tablet Take      1 tablet       2 x /day (every 12 hours)        for BP Patient taking differently: Take 25 mg by mouth 2 (two) times daily. 02/22/20  Yes Unk Pinto, MD  Multiple Vitamin (MULTIVITAMIN) tablet Take 1 tablet by mouth daily.   Yes [provider]  rosuvastatin (CRESTOR) 20 MG tablet TAKE 1 TABLET BY MOUTH EVERY DAY FOR CHOLESTEROL Patient taking differently: Take 20 mg by mouth daily. 05/21/19  Yes [name] R, PA-C  telmisartan (MICARDIS) 20 MG tablet TAKE 1 TABLET DAILY FOR BP & KIDNEY PROTECTION Patient taking differently: Take 20 mg by mouth daily. 02/22/20  Yes Unk Pinto,  MD  benzonatate (TESSALON) 200 MG capsule Take 1 perle 3 x / day to prevent cough Patient not taking: No sig reported 05/25/20   Unk Pinto, MD    Physical Exam: Vitals:   06/08/20 0945 06/08/20 1000 06/08/20 1015 06/08/20 1336  BP:  (!) 156/94 (!) 146/87 140/84  Pulse: (!) 109 (!) 107 (!) 107 91  Resp: (!) 25 (!) 27 (!) 21 17  Temp:    98.1 F (36.7 C)  TempSrc:    Oral  SpO2: 99% 98% 98% 98%  Weight:      Height:         . General:  Appears calm and comfortable and is in NAD, clearly confused . Eyes:  PERRL, EOMI, normal lids, iris . ENT:  grossly normal hearing, lips & tongue, mmm . Neck:  no LAD, masses or thyromegaly; ?R carotid bruit . Cardiovascular:  RR with mild tachycardia, no m/r/g. No LE edema. Well-healed sternotomy scar. [name] Respiratory:   CTA bilaterally with no wheezes/rales/rhonchi.  Mildly increased respiratory effort. . Abdomen:  soft, NT, ND, NABS . Skin:  no rash or induration seen on limited exam . Musculoskeletal:  grossly normal tone BUE/BLE, good ROM, no bony abnormality . Psychiatric:  blunted mood and affect, speech slow and clearly confused - poor history, unable to follow commands . Neurologic:  CN 2-12 grossly intact with ?subtle L facial droop (unable to follow commands well enough to know for sure), appears to move all extremities in coordinated fashion, sensation intact    Radiological Exams on Admission: Independently reviewed - see discussion in A/P where applicable  DG Chest 2 View  Result Date: 06/08/2020 CLINICAL DATA:  70 year old male with altered mental status.  Cough. EXAM: CHEST - 2 VIEW COMPARISON:  Chest radiographs 06/05/2020 and earlier. FINDINGS: Upright PA and lateral views. Sequelae of CABG. Normal cardiac size and mediastinal contours. Stable lung volumes and ventilation with no pneumothorax, pulmonary edema, pleural effusion or confluent pulmonary opacity. Stable visualized osseous structures. Healing right lateral rib  fractures again noted. Negative visible bowel gas pattern. IMPRESSION: No acute cardiopulmonary abnormality. Healing right rib fractures again noted. Electronically Signed   By: Genevie Ann M.D.   On: 06/08/2020 07:58   CT Head Wo Contrast  Result Date: 06/08/2020 CLINICAL DATA:  Altered mental status. EXAM: CT HEAD WITHOUT CONTRAST TECHNIQUE: Contiguous axial images were obtained from the base of the skull through the vertex without intravenous contrast. COMPARISON:  April 03, 2007. FINDINGS: Brain: Mild diffuse cortical atrophy is noted. Mild chronic ischemic white matter disease is noted. Left occipital low density is noted concerning for infarction of indeterminate age. No midline shift is noted. Ventricular size is within normal limits. No hemorrhage is noted. Vascular: No hyperdense vessel or unexpected calcification. Skull: Normal. Negative for fracture or focal lesion. Sinuses/Orbits: Bilateral maxillary sinusitis is noted. Other: None. IMPRESSION: Left occipital low density is noted concerning for infarction of indeterminate age. MRI is recommended for further evaluation. Electronically Signed   By: Marijo Conception M.D.   On: 06/08/2020 10:02    EKG: Independently reviewed.  NSR with rate 93; LVH with NSCSLT  Labs on Admission: I have personally reviewed the available labs and imaging studies at the time of the admission.  Pertinent labs:   Glucose 363 Albumin 2.9 WBC 10.8 Hgb 10.6 Platelets 435 UA: >500 glucose, 5 ketones   Assessment/Plan Principal Problem:   Acute metabolic encephalopathy Active Problems:   Hyperlipidemia associated with type 2 diabetes mellitus (HCC)   Carotid stenosis   Essential hypertension   DM type 2 (diabetes mellitus, type 2) ([place])   Acute metabolic encephalopathy -Patient with possible concern for early onset dementia as well as possible cognitive impairment following COVID-19 infection in Jan 2022 now presenting with worsening confusion for several  days -He has a known h/o R-sided carotid stenosis and CT indicated possible L-sided occipital density, suggestive of possible CVA -There is no evidence of infection at this time -Aspirin has been given to reduce  stroke mortality and decrease morbidity -Will place in observation status for CVA/TIA evaluation -Telemetry monitoring -MRI/MRA -Carotid dopplers; if ipsilateral carotid stenosis is detected then prompt vascular surgery consultation is needed for consideration of CEA. -Echo -If the patient does not have known afib and this is not detected on telemetry during hospitalization, consider outpatient Holter monitoring and/or loop recorder placement. -Consider thrombectomy if there is persistent disabling neurologic deficit associated with a vascular cut-off -Neurology consult for consideration of CVA/dementia -PT/OT/ST/Nutrition Consults  HTN -Allow permissive HTN for now -Treat BP only if >220/120, and then with goal of 15% reduction -Hold Lopressor/Micardis and plan to restart in 48-72 hours   HLD -Check FLP -Continue Crestor 20 mg daily   DM -Recent A1c shows poor control -Hold home PO medications (Glucophage, Jardiance, Amaryl) -Will order moderate-scale SSI -He appears likely to benefit from initiation of insulin and this has been ordered by his PCP but not yet started -Will request DM consultation and start Lantus 10 units qhs  Carotid stenosis -R ICA 40-59% and L ICA 1-39% in 08/2019 -Repeat carotid dopplers -As noted above, if ipsilateral stenosis will need CEA during this hospitalization  CAD  -s/p CABG -Continue ASA  H/o COVID -Patient with residual cognitive impairment, also possible respiratory issues -Does not need retesting since previously positive within 90 days (+ 03/31/20) -Stop Phenergan with Codeine as well as other meds (steroids, antibiotics) as this might be contributing to confusion    Note: This patient has had COVID-19 within the last 90 days  and does not require retesting. He has been fully vaccinated against COVID-19.     Level of care: Telemetry Medical DVT prophylaxis:  Lovenox  Code Status: Full - confirmed with patient/family Family Communication: Son present throughout evaluation Disposition Plan:  The patient is from: home  Anticipated d/c is to: home without [facility] services  Anticipated d/c date will depend on clinical response to treatment, but possibly as early as tomorrow if he has excellent response to treatment  Patient is currently: acutely ill Consults called: Neurology; PT/OT/ST/Nutrition Admission status: It is my clinical opinion that referral for OBSERVATION is reasonable and necessary in this patient based on the above information provided. The aforementioned taken together are felt to place the patient at high risk for further clinical deterioration. However it is anticipated that the patient may be medically stable for discharge from the hospital within 24 to 48 hours.     Karmen Bongo MD [facility]   How to contact the [facility] Attending or Consulting provider [place] or covering provider during after hours [place], for this patient?  1. Check the care team in [facility] and look for a) attending/consulting [facility] provider listed and b) the [facility] team listed 2. Log into [email or web address] and use [place]'s universal password to access. If you do not have the password, please contact the hospital operator. 3. Locate the [facility] provider you are looking for under [facility] and page to a number that you can be directly reached. 4. If you still have difficulty reaching the provider, please page the [facility] (Director on Call) for the Hospitalists listed on amion for assistance.   06/08/2020, 2:13 PM

## 2020-06-08 NOTE — ED Notes (Signed)
Transport paged

## 2020-06-08 NOTE — ED Notes (Signed)
Pt back from [facility]ri

## 2020-06-08 NOTE — Progress Notes (Signed)
  Echocardiogram 2D Echocardiogram has been performed.  Johny Chess 06/08/2020, 3:17 PM

## 2020-06-08 NOTE — ED Triage Notes (Addendum)
Pt bib [facility] from home for alerted mental status starting this morning. Pt family states that he was urinating on the floor and getting back into the bed naked, pt normally alert and orientedx4. Previous history of UTI Ems reports cbg of 441. Pt presents with no pain and complaining of just feeling "a little slow". VSS

## 2020-06-08 NOTE — ED Notes (Signed)
Pt and family member aware of the need for urine

## 2020-06-08 NOTE — Telephone Encounter (Signed)
Patient's son main concerns were that the office was AWARE of what is going on with his father. He (patient's son) wanted to report that his father was confused today NOT if he was confused at his last office visit. Son was aware of [facility]!: 10  I made ALI aware that we could see the hospital note & that the office is aware of his father's ER visit. At the time of the call no insulin was sent to pharmacy but the patient is still in the ER so I told pt's son that I would send a message back to the provider.   FYI: pt can not view [place] would like a call to his phone [phone]

## 2020-06-08 NOTE — ED Provider Notes (Signed)
[place] EMERGENCY DEPARTMENT Provider Note   CSN: [id number] Arrival date & time: 06/08/20  0547     History Chief Complaint  Patient presents with  . Altered Mental Status    William Haney is a 70 y.o. male.  70 year old male with history of DM, HTN, hyperlipidemia, CKD 2, brought in by EMS from home, daughter at bedside. Patient states he had chills this morning but feels fine now. Daughter states patient was urinating on the floor and got back into bed naked, was confused and family called 911. Daughter states patient was slow to answer questions and less talkative yesterday, seems improved at this time but slow to answer questions. Patient denies CP, SHOB, abdominal pain, changes in bowel or bladder habits. Fall in 2/22 with right rib fractures, no recent falls or injuries. Blood glucose in the 400s with EMS, missed nighttime meds yesterday.         Past Medical History:  Diagnosis Date  . Anemia   . Carotid artery stenosis 01/2017   L-ICA 1-39%, R-ICA 40-59% doppler  . Coronary artery disease    s/p CABG in 2008, w/ LIMA-LAD, SVG-Diag, SVG-OM, SVG-PL  . Diabetes mellitus type II   . Hyperlipidemia   . Hypertension   . Vitamin D deficiency     Patient Active Problem List   Diagnosis Date Noted  . COVID-19 (03/31/2020) 04/03/2020  . CKD stage 2 due to type 2 diabetes mellitus ([place]) 02/11/2020  . Aorto-iliac atherosclerosis ([place]) 05/26/2019  . Primary osteoarthritis of left knee 12/18/2018  . beta thalassemia minor (trait) anemia 03/08/2018  . Overweight (BMI 25.0-29.9) 08/10/2017  . Diabetic retinopathy of both eyes ([place]) 08/10/2017  . Poor compliance 09/09/2015  . DM type 2 (diabetes mellitus, type 2) ([place]) 09/08/2015  . Neuropathy, diabetic ([place]) 05/19/2015  . Encounter for Medicare annual wellness exam 09/21/2014  . Essential hypertension 10/11/2013  . Vitamin D deficiency 10/11/2013  . Medication management 10/11/2013  . Hyperlipidemia  associated with type 2 diabetes mellitus ([place]) 03/01/2010  . Coronary atherosclerosis 03/01/2010  . Carotid stenosis 03/01/2010    Past Surgical History:  Procedure Laterality Date  . CORONARY ARTERY BYPASS GRAFT  2008   LIMA to the LAD, saphenous vein graft to diagonal, saphenous vein graft to OM, saphenous vein graft to posterolateral 2008  . TOTAL KNEE ARTHROPLASTY Left 12/18/2018   Procedure: TOTAL KNEE ARTHROPLASTY;  Surgeon: Dorna Leitz, MD;  Location: [facility];  Service: Orthopedics;  Laterality: Left;       Family History  Problem Relation Age of Onset  . Hypertension Father   . Heart disease Father   . Hyperlipidemia Father   . Heart attack Father   . Diabetes Sister   . Heart disease Brother     Social History   Tobacco Use  . Smoking status: Former Smoker    Packs/day: 1.00    Years: 34.00    Pack years: 34.00    Start date: 30    Quit date: 08/28/2005    Years since quitting: 14.7  . Smokeless tobacco: Never Used  Vaping Use  . Vaping Use: Never used  Substance Use Topics  . Alcohol use: No  . Drug use: No    Home Medications Prior to Admission medications   Medication Sig Start Date End Date Taking? Authorizing Provider  aspirin EC 81 MG tablet Take 81 mg by mouth daily. Swallow whole.   Yes [provider]  blood glucose meter kit and  supplies Test sugars once to twice daily 08/24/18  Yes Vladimir Crofts, PA-C  Cholecalciferol (VITAMIN D3) 125 MCG (5000 UT) CAPS Take 5,000 Units by mouth daily.   Yes [provider]  Cinnamon 500 MG capsule Take 2 capsules (1,000 mg total) by mouth 2 (two) times daily. Patient taking differently: Take 500 mg by mouth daily. 02/05/17  Yes Liane Comber, NP  cyclobenzaprine (FLEXERIL) 10 MG tablet TAKE 1/2 TO 1 TABLET BY MOUTH 3 TIMES A DAY AS NEEDED FOR MUSCLE SPASMS Patient taking differently: Take 5-10 mg by mouth 3 (three) times daily as needed for muscle spasms. 03/28/20  Yes McClanahan, Danton Sewer, NP   empagliflozin (JARDIANCE) 25 MG TABS tablet Take 1 tablet (25 mg total) by mouth daily. 02/21/20  Yes Liane Comber, NP  glimepiride (AMARYL) 4 MG tablet Take  1 tablet  2 x /day  with Meals  for Diabetes Patient taking differently: Take 4 mg by mouth in the morning and at bedtime. 05/06/20  Yes Unk Pinto, MD  metFORMIN (GLUCOPHAGE) 500 MG tablet TAKE 2 TABLETS TWICE A DAY WITH MEALS FOR DIABETES Patient taking differently: Take 1,000 mg by mouth 2 (two) times daily with a meal. 02/22/20  Yes Unk Pinto, MD  metoprolol tartrate (LOPRESSOR) 25 MG tablet Take      1 tablet       2 x /day (every 12 hours)        for BP Patient taking differently: Take 25 mg by mouth 2 (two) times daily. 02/22/20  Yes Unk Pinto, MD  Multiple Vitamin (MULTIVITAMIN) tablet Take 1 tablet by mouth daily.   Yes [provider]  rosuvastatin (CRESTOR) 20 MG tablet TAKE 1 TABLET BY MOUTH EVERY DAY FOR CHOLESTEROL Patient taking differently: Take 20 mg by mouth daily. 05/21/19  Yes [name] R, PA-C  telmisartan (MICARDIS) 20 MG tablet TAKE 1 TABLET DAILY FOR BP & KIDNEY PROTECTION Patient taking differently: Take 20 mg by mouth daily. 02/22/20  Yes Unk Pinto, MD  benzonatate (TESSALON) 200 MG capsule Take 1 perle 3 x / day to prevent cough Patient not taking: No sig reported 05/25/20   Unk Pinto, MD    Allergies    Ace inhibitors and Statins  Review of Systems   Review of Systems  Constitutional: Negative for fever.  Respiratory: Negative for shortness of breath.   Cardiovascular: Negative for chest pain.  Gastrointestinal: Negative for abdominal pain, constipation, diarrhea, nausea and vomiting.  Genitourinary: Negative for difficulty urinating and dysuria.  Musculoskeletal: Negative for arthralgias and myalgias.  Skin: Negative for rash and wound.  Allergic/Immunologic: Positive for immunocompromised state.  Neurological: Negative for weakness.  Hematological:  Does not bruise/bleed easily.  Psychiatric/Behavioral: Positive for confusion.  All other systems reviewed and are negative.   Physical Exam Updated Vital Signs BP (!) 146/87   Pulse (!) 107   Temp 98.2 F (36.8 C) (Oral)   Resp (!) 21   Ht _0  (1.549 m)   Wt 55.8 kg   SpO2 98%   BMI 23.24 kg/m   Physical Exam Vitals and nursing note reviewed.  Constitutional:      General: He is not in acute distress.    Appearance: He is well-developed. He is not diaphoretic.  HENT:     Head: Normocephalic and atraumatic.     Mouth/Throat:     Mouth: Mucous membranes are moist.  Eyes:     Conjunctiva/sclera: Conjunctivae normal.  Cardiovascular:     Rate and Rhythm:  Normal rate and regular rhythm.     Heart sounds: Normal heart sounds.  Pulmonary:     Effort: Pulmonary effort is normal.     Breath sounds: Normal breath sounds.  Abdominal:     Palpations: Abdomen is soft.     Tenderness: There is no abdominal tenderness.  Musculoskeletal:     Right lower leg: No edema.     Left lower leg: No edema.  Skin:    General: Skin is warm and dry.     Findings: No erythema or rash.  Neurological:     Mental Status: He is alert and oriented to person, place, and time.     Cranial Nerves: No cranial nerve deficit.     Sensory: No sensory deficit.     Motor: No weakness.  Psychiatric:        Behavior: Behavior normal.     ED Results / Procedures / Treatments   Labs (all labs ordered are listed, but only abnormal results are displayed) Labs Reviewed  CBC WITH DIFFERENTIAL/PLATELET - Abnormal; Notable for the following components:      Result Value   WBC 10.8 (*)    Hemoglobin 10.6 (*)    HCT 34.5 (*)    MCV 64.6 (*)    MCH 19.9 (*)    RDW 17.8 (*)    Platelets 435 (*)    Neutro Abs 8.2 (*)    Abs Immature Granulocytes 0.14 (*)    All other components within normal limits  COMPREHENSIVE METABOLIC PANEL - Abnormal; Notable for the following components:   Glucose, Bld 363 (*)     Total Protein 6.1 (*)    Albumin 2.9 (*)    AST 10 (*)    All other components within normal limits  URINALYSIS, ROUTINE W REFLEX MICROSCOPIC - Abnormal; Notable for the following components:   Color, Urine STRAW (*)    Glucose, UA >=500 (*)    Ketones, ur 5 (*)    All other components within normal limits  CBG MONITORING, ED - Abnormal; Notable for the following components:   Glucose-Capillary 324 (*)    All other components within normal limits  CBG MONITORING, ED - Abnormal; Notable for the following components:   Glucose-Capillary 348 (*)    All other components within normal limits    EKG EKG Interpretation  Date/Time:  Thursday June 08 2020 05:57:22 EDT Ventricular Rate:  93 PR Interval:  159 QRS Duration: 85 QT Interval:  333 QTC Calculation: 415 R Axis:   8 Text Interpretation: Sinus rhythm Abnormal R-wave progression, early transition Left ventricular hypertrophy Nonspecific T abnormalities, inferior leads No significant change since last tracing Confirmed by Merrily Pew [phone]) on 06/08/2020 6:40:48 AM   Radiology DG Chest 2 View  Result Date: 06/08/2020 CLINICAL DATA:  70 year old male with altered mental status.  Cough. EXAM: CHEST - 2 VIEW COMPARISON:  Chest radiographs 06/05/2020 and earlier. FINDINGS: Upright PA and lateral views. Sequelae of CABG. Normal cardiac size and mediastinal contours. Stable lung volumes and ventilation with no pneumothorax, pulmonary edema, pleural effusion or confluent pulmonary opacity. Stable visualized osseous structures. Healing right lateral rib fractures again noted. Negative visible bowel gas pattern. IMPRESSION: No acute cardiopulmonary abnormality. Healing right rib fractures again noted. Electronically Signed   By: Genevie Ann M.D.   On: 06/08/2020 07:58   CT Head Wo Contrast  Result Date: 06/08/2020 CLINICAL DATA:  Altered mental status. EXAM: CT HEAD WITHOUT CONTRAST TECHNIQUE: Contiguous axial images were obtained from  the  base of the skull through the vertex without intravenous contrast. COMPARISON:  April 03, 2007. FINDINGS: Brain: Mild diffuse cortical atrophy is noted. Mild chronic ischemic white matter disease is noted. Left occipital low density is noted concerning for infarction of indeterminate age. No midline shift is noted. Ventricular size is within normal limits. No hemorrhage is noted. Vascular: No hyperdense vessel or unexpected calcification. Skull: Normal. Negative for fracture or focal lesion. Sinuses/Orbits: Bilateral maxillary sinusitis is noted. Other: None. IMPRESSION: Left occipital low density is noted concerning for infarction of indeterminate age. MRI is recommended for further evaluation. Electronically Signed   By: Marijo Conception M.D.   On: 06/08/2020 10:02    Procedures Procedures   Medications Ordered in ED Medications  sodium chloride 0.9 % bolus 500 mL (0 mLs Intravenous Stopped 06/08/20 0829)    ED Course  I have reviewed the triage vital signs and the nursing notes.  Pertinent labs & imaging results that were available during my care of the patient were reviewed by me and considered in my medical decision making (see chart for details).  Clinical Course as of 06/08/20 1046  Thu Mar 31, 671  2166 70 year old male brought in by EMS from home, per family was confused this morning, urinated on the floor, was naked and trying to pull the blankets back up in bed.  Symptoms had resolved upon arrival in the emergency room, patient states he was shaky this morning but feels fine now.  Patient answers questions but otherwise does not engage in conversation, states he would like to go home.  Labs ordered and reviewed, compared with labs obtained through PCP office 2 days ago, no significant changes to CBC, CMP beyond elevated glucose at 363. Urinalysis with glucose greater than 500, somatic ketones, CMP does not suggest DKA.  Case discussed with Dr. Tyrone Nine, ER attending, recommends  chest x-ray which shows healing old rib fractures. Dr. Tyrone Nine has seen the patient.   Plan was for discharge home, advised to discontinue his cold medication which was recently started on for his cough. [LM]  [phone] At discharge, family is concerned patient is weak, not quite himself- got dressed and put his shirt on inside out, again voided on the floor which is not normal for him.  On reassessment, patient states he feels fine, feels like his hands are shaky for the past 2 days for unknown reasons. Patient would like to go home but is agreeable to further work up, will add on head CT. [LM]  16 CT with left occipital stroke of indeterminate age, recommends MRI.   Discussed results with son, states patient had COVID 03/31/20, has had a gradual decline since that time, is alone during the day while family is at work and is normally able to care for self. Normally has a gait disturbance from severe knee arthritis, prior knee replacement but does not want other knee replaced as he is not pleased without come on the replaced knee. Had to get blood sugars controlled for initial knee replacement several years ago, has been better controlled however was given rx for insulin at last PCP visit this week for A1C of 10, has not started the insulin yet. [LM]  9528 Last known normal hard to define with gradual decline over the past 2 months. [LM]  1045 Discussed with Dr. Lorin Mercy with [facility] who will consult for admission.  [LM]    Clinical Course User Index [LM] Roque Lias   MDM  Rules/Calculators/A&P                          Final Clinical Impression(s) / ED Diagnoses Final diagnoses:  Weakness  Confusion  Occipital stroke [facility])  Urinary incontinence, unspecified type    Rx / DC Orders ED Discharge Orders    None       Tacy Learn, PA-C 06/08/20 [place], DO 06/10/20 0104

## 2020-06-08 NOTE — Consult Note (Signed)
Neurology Consultation Reason for Consult: AMS Referring Physician: Thomasenia Bottoms  CC: AMS  History is obtained from: Chart review, patient  HPI: William Haney is a 70 y.o. male with a history of hypertension, hyperlipidemia, diabetes who presents with encephalopathy.  He apparently has been acting delirious over the past few days.  Of note, he does have diabetes and was recently seen by his PCP on March 17 for chest congestion and was prescribed Phenergan with codeine, azithromycin, dexamethasone.  He was brought into the emergency department because he was more confused, peed on the floor this morning.  Glucose was 441.  He also complained of some vertigo about 4 days ago, but this is improved.  He states that he is feeling better, but does note that he has been confused over the past few days..  He does note that he has been losing weight, despite not trying to.   ROS: A 14 point ROS was performed and is negative except as noted in the HPI.   Past Medical History:  Diagnosis Date  . Anemia   . Carotid artery stenosis 01/2017   L-ICA 1-39%, R-ICA 40-59% doppler  . Coronary artery disease    s/p CABG in 2008, w/ LIMA-LAD, SVG-Diag, SVG-OM, SVG-PL  . Diabetes mellitus type II   . Hyperlipidemia   . Hypertension   . Vitamin D deficiency      Family History  Problem Relation Age of Onset  . Hypertension Father   . Heart disease Father   . Hyperlipidemia Father   . Heart attack Father   . Diabetes Sister   . Heart disease Brother      Social History:  reports that he quit smoking about 14 years ago. He started smoking about 49 years ago. He has a 34.00 pack-year smoking history. He has never used smokeless tobacco. He reports that he does not drink alcohol and does not use drugs.   Exam: Current vital signs: BP (!) 163/89 (BP Location: Right Arm)   Pulse (!) 102   Temp 99.4 F (37.4 C) (Oral)   Resp (!) 25   Ht 5\' 1"  (1.549 m)   Wt 55.8 kg   SpO2 98%   BMI 23.24 kg/m   Vital signs in last 24 hours: Temp:  [98.1 F (36.7 C)-99.4 F (37.4 C)] 99.4 F (37.4 C) (03/31 1714) Pulse Rate:  [84-109] 102 (03/31 1714) Resp:  [17-30] 25 (03/31 1645) BP: (123-175)/(74-119) 163/89 (03/31 1714) SpO2:  [95 %-100 %] 98 % (03/31 1714) Weight:  [55.8 kg] 55.8 kg (03/31 2725)   Physical Exam  Constitutional: Appears well-developed and well-nourished.  Psych: Affect appropriate to situation Eyes: No scleral injection HENT: No OP obstruction MSK: no joint deformities.  Cardiovascular: Normal rate and regular rhythm.  Respiratory: Effort normal, non-labored breathing GI: Soft.  No distension. There is no tenderness.  Skin: WDI  Neuro: Mental Status: Patient is awake, alert, oriented to month, year, he gives the day of the week as Tuesday. He is able to spell world forwards, but only able to get the D when attempting to do a backwards No signs of aphasia or neglect Cranial Nerves: II: Visual Fields are full. Pupils are equal, round, and reactive to light.   III,IV, VI: EOMI without ptosis or diploplia.  V: Facial sensation is symmetric to temperature VII: Facial movement is symmetric.  VIII: hearing is intact to voice X: Uvula elevates symmetrically XI: Shoulder shrug is symmetric. XII: tongue is midline without atrophy or fasciculations.  Motor: Tone is normal. Bulk is normal. 5/5 strength was present in all four extremities.  Sensory: Sensation is symmetric to light touch and temperature in the arms and legs. Cerebellar: he has wobble on finger-nose-finger without clear past-pointing bilaterally.    I have reviewed labs in epic and the results pertinent to this consultation are: Na 135 Glucose 363 Cr 1.06 b12 334 RPR - pending TSH 1.809  I have reviewed the images obtained: MRI brain-small area concerning for meningioma, but otherwise unremarkable  Impression: 70 year old with acute encephalopathy of unclear origin.  My suspicion is that this  represents a multifactorial delirium, possibly related to his blood glucose and recent steroid course.  He does seem relatively clear to me when I examined him, and hopefully this does suggest that he is improving.  Recommendations: 1) thiamine level, followed by thiamine repletion 2) contrasted MRI to further evaluate likely meningioma. 3) EEG 4) Ammonia 5) If he continues to be delirious, might consider seroquel 25 mg q8pm  Roland Rack, MD [facility] [phone]  If 7pm- 7am, please page neurology on call as listed in [place].

## 2020-06-09 ENCOUNTER — Inpatient Hospital Stay (HOSPITAL_COMMUNITY): Payer: Medicare Other

## 2020-06-09 ENCOUNTER — Observation Stay (HOSPITAL_COMMUNITY): Payer: Medicare Other

## 2020-06-09 DIAGNOSIS — I255 Ischemic cardiomyopathy: Secondary | ICD-10-CM

## 2020-06-09 DIAGNOSIS — Z833 Family history of diabetes mellitus: Secondary | ICD-10-CM | POA: Diagnosis not present

## 2020-06-09 DIAGNOSIS — G928 Other toxic encephalopathy: Secondary | ICD-10-CM | POA: Diagnosis not present

## 2020-06-09 DIAGNOSIS — Z87891 Personal history of nicotine dependence: Secondary | ICD-10-CM | POA: Diagnosis not present

## 2020-06-09 DIAGNOSIS — N281 Cyst of kidney, acquired: Secondary | ICD-10-CM | POA: Diagnosis not present

## 2020-06-09 DIAGNOSIS — I6523 Occlusion and stenosis of bilateral carotid arteries: Secondary | ICD-10-CM | POA: Diagnosis present

## 2020-06-09 DIAGNOSIS — E1151 Type 2 diabetes mellitus with diabetic peripheral angiopathy without gangrene: Secondary | ICD-10-CM | POA: Diagnosis not present

## 2020-06-09 DIAGNOSIS — Z79899 Other long term (current) drug therapy: Secondary | ICD-10-CM | POA: Diagnosis not present

## 2020-06-09 DIAGNOSIS — E1169 Type 2 diabetes mellitus with other specified complication: Secondary | ICD-10-CM | POA: Diagnosis present

## 2020-06-09 DIAGNOSIS — N182 Chronic kidney disease, stage 2 (mild): Secondary | ICD-10-CM | POA: Diagnosis not present

## 2020-06-09 DIAGNOSIS — I249 Acute ischemic heart disease, unspecified: Secondary | ICD-10-CM | POA: Diagnosis present

## 2020-06-09 DIAGNOSIS — R410 Disorientation, unspecified: Secondary | ICD-10-CM | POA: Diagnosis present

## 2020-06-09 DIAGNOSIS — Z951 Presence of aortocoronary bypass graft: Secondary | ICD-10-CM | POA: Diagnosis not present

## 2020-06-09 DIAGNOSIS — Z8249 Family history of ischemic heart disease and other diseases of the circulatory system: Secondary | ICD-10-CM | POA: Diagnosis not present

## 2020-06-09 DIAGNOSIS — Z8616 Personal history of COVID-19: Secondary | ICD-10-CM | POA: Diagnosis not present

## 2020-06-09 DIAGNOSIS — Z7982 Long term (current) use of aspirin: Secondary | ICD-10-CM | POA: Diagnosis not present

## 2020-06-09 DIAGNOSIS — Z96652 Presence of left artificial knee joint: Secondary | ICD-10-CM | POA: Diagnosis present

## 2020-06-09 DIAGNOSIS — I2510 Atherosclerotic heart disease of native coronary artery without angina pectoris: Secondary | ICD-10-CM | POA: Diagnosis present

## 2020-06-09 DIAGNOSIS — R9401 Abnormal electroencephalogram [EEG]: Secondary | ICD-10-CM | POA: Diagnosis present

## 2020-06-09 DIAGNOSIS — E1165 Type 2 diabetes mellitus with hyperglycemia: Secondary | ICD-10-CM | POA: Diagnosis not present

## 2020-06-09 DIAGNOSIS — E440 Moderate protein-calorie malnutrition: Secondary | ICD-10-CM | POA: Insufficient documentation

## 2020-06-09 DIAGNOSIS — Z7984 Long term (current) use of oral hypoglycemic drugs: Secondary | ICD-10-CM | POA: Diagnosis not present

## 2020-06-09 DIAGNOSIS — G9341 Metabolic encephalopathy: Secondary | ICD-10-CM

## 2020-06-09 DIAGNOSIS — D320 Benign neoplasm of cerebral meninges: Secondary | ICD-10-CM | POA: Diagnosis present

## 2020-06-09 DIAGNOSIS — I1310 Hypertensive heart and chronic kidney disease without heart failure, with stage 1 through stage 4 chronic kidney disease, or unspecified chronic kidney disease: Secondary | ICD-10-CM | POA: Diagnosis not present

## 2020-06-09 DIAGNOSIS — E1122 Type 2 diabetes mellitus with diabetic chronic kidney disease: Secondary | ICD-10-CM | POA: Diagnosis present

## 2020-06-09 DIAGNOSIS — E785 Hyperlipidemia, unspecified: Secondary | ICD-10-CM | POA: Diagnosis present

## 2020-06-09 DIAGNOSIS — Z83438 Family history of other disorder of lipoprotein metabolism and other lipidemia: Secondary | ICD-10-CM | POA: Diagnosis not present

## 2020-06-09 DIAGNOSIS — E7220 Disorder of urea cycle metabolism, unspecified: Secondary | ICD-10-CM | POA: Diagnosis not present

## 2020-06-09 LAB — COMPREHENSIVE METABOLIC PANEL
ALT: 13 U/L (ref 0–44)
AST: 12 U/L — ABNORMAL LOW (ref 15–41)
Albumin: 3 g/dL — ABNORMAL LOW (ref 3.5–5.0)
Alkaline Phosphatase: 61 U/L (ref 38–126)
Anion gap: 10 (ref 5–15)
BUN: 14 mg/dL (ref 8–23)
CO2: 23 mmol/L (ref 22–32)
Calcium: 8.9 mg/dL (ref 8.9–10.3)
Chloride: 106 mmol/L (ref 98–111)
Creatinine, Ser: 0.97 mg/dL (ref 0.61–1.24)
GFR, Estimated: >60 mL/min (ref >60)
Glucose, Bld: 151 mg/dL — ABNORMAL HIGH (ref 70–99)
Potassium: 4.1 mmol/L (ref 3.5–5.1)
Sodium: 139 mmol/L (ref 135–145)
Total Bilirubin: 1 mg/dL (ref 0.3–1.2)
Total Protein: 6.3 g/dL — ABNORMAL LOW (ref 6.5–8.1)

## 2020-06-09 LAB — LIPID PANEL
Cholesterol: 154 mg/dL (ref 0–200)
HDL: 28 mg/dL — ABNORMAL LOW (ref >40)
LDL Cholesterol: 95 mg/dL (ref 0–99)
Total CHOL/HDL Ratio: 5.5 RATIO
Triglycerides: 157 mg/dL — ABNORMAL HIGH (ref <150)
VLDL: 31 mg/dL (ref 0–40)

## 2020-06-09 LAB — GLUCOSE, CAPILLARY
Glucose-Capillary: 177 mg/dL — ABNORMAL HIGH (ref 70–99)
Glucose-Capillary: 119 mg/dL — ABNORMAL HIGH (ref 70–99)
Glucose-Capillary: 158 mg/dL — ABNORMAL HIGH (ref 70–99)
Glucose-Capillary: 72 mg/dL (ref 70–99)
Glucose-Capillary: 234 mg/dL — ABNORMAL HIGH (ref 70–99)

## 2020-06-09 LAB — PROTIME-INR
INR: 1.1 (ref 0.8–1.2)
Prothrombin Time: 13.5 seconds (ref 11.4–15.2)

## 2020-06-09 LAB — AMMONIA: Ammonia: 70 umol/L — ABNORMAL HIGH (ref 9–35)

## 2020-06-09 LAB — TROPONIN I (HIGH SENSITIVITY)
Troponin I (High Sensitivity): 10 ng/L (ref <18)
Troponin I (High Sensitivity): 11 ng/L (ref <18)

## 2020-06-09 LAB — RPR: RPR Ser Ql: NONREACTIVE

## 2020-06-09 MED ORDER — LACTULOSE 10 GM/15ML PO SOLN
20.0000 g | Freq: Two times a day (BID) | ORAL | Status: DC
  Administered 2020-06-09 – 2020-06-10 (×3): 20 g via ORAL
  Filled 2020-06-09 (×3): qty 30

## 2020-06-09 MED ORDER — IRBESARTAN 150 MG PO TABS
75.0000 mg | ORAL_TABLET | Freq: Every day | ORAL | Status: DC
  Administered 2020-06-09 – 2020-06-10 (×2): 75 mg via ORAL
  Filled 2020-06-09: qty 0.5
  Filled 2020-06-09: qty 1

## 2020-06-09 MED ORDER — PROSOURCE PLUS PO LIQD
30.0000 mL | Freq: Two times a day (BID) | ORAL | Status: DC
  Administered 2020-06-10: 30 mL via ORAL
  Filled 2020-06-09: qty 30

## 2020-06-09 MED ORDER — GLUCERNA SHAKE PO LIQD
237.0000 mL | Freq: Three times a day (TID) | ORAL | Status: DC
  Administered 2020-06-09 – 2020-06-10 (×2): 237 mL via ORAL

## 2020-06-09 MED ORDER — METOPROLOL TARTRATE 25 MG PO TABS
25.0000 mg | ORAL_TABLET | Freq: Two times a day (BID) | ORAL | Status: DC
  Administered 2020-06-09 – 2020-06-10 (×2): 25 mg via ORAL
  Filled 2020-06-09 (×3): qty 1

## 2020-06-09 MED ORDER — METFORMIN HCL 500 MG PO TABS
1000.0000 mg | ORAL_TABLET | Freq: Two times a day (BID) | ORAL | Status: DC
  Administered 2020-06-09 – 2020-06-10 (×2): 1000 mg via ORAL
  Filled 2020-06-09 (×2): qty 2

## 2020-06-09 MED ORDER — ADULT MULTIVITAMIN W/MINERALS CH
1.0000 | ORAL_TABLET | Freq: Every day | ORAL | Status: DC
  Administered 2020-06-09 – 2020-06-10 (×2): 1 via ORAL
  Filled 2020-06-09 (×2): qty 1

## 2020-06-09 NOTE — Evaluation (Signed)
Occupational Therapy Evaluation Patient Details Name: William Haney MRN: [id number] DOB: 1950/07/08 Today's Date: 06/09/2020    History of Present Illness 70 y.o. male presenting with worsening AMS x several days. Patient admitted with acute metabolic encephalopathy with possible concern for early onset dementia.  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 Further work-up pending. PMHx significant for HTN, HLD, DM, COVID-19 03/2020, L TKA 12/2018, PVD with carotid stenosis, and CAD s/p CABG.   Clinical Impression   PTA patient was living with his spouse and 3 adult children in a private residence and was independence with ADLs/IADLs including driving, working part-time, medication management and Comptroller. Patient able to provide PLOF and home set-up without difficulty. Per chart review, son reports gradual decline in cognition since having COVID-19 several months ago. Patient with noted decreased STM this date scoring a 13 on the SBT indicating presence cognitive deficits. Patient particularly struggled to count backwards from 20 to 1, recite the months of the year backwards, and recall a name and address after several minute delay. OT will continue to follow acutely to maximize safety and independence with ADLs/IADLs in prep for safe return home.     Follow Up Recommendations  No OT follow up;Supervision - Intermittent    Equipment Recommendations  None recommended by OT    Recommendations for Other Services       Precautions / Restrictions Precautions Precautions: Fall Restrictions Weight Bearing Restrictions: No      Mobility Bed Mobility Overal bed mobility: Needs Assistance Bed Mobility: Sit to Supine       Sit to supine: Supervision   General bed mobility comments: For safety.    Transfers Overall transfer level: Needs assistance Equipment used: None Transfers: Sit to/from Stand Sit to Stand:  Supervision         General transfer comment: Supervision A for safety.    Balance Overall balance assessment: No apparent balance deficits (not formally assessed) (Limited assessment 2/2 IV being connected to bed. Will continue to assess.)                                         ADL either performed or assessed with clinical judgement   ADL Overall ADL's : Needs assistance/impaired                     Lower Body Dressing: Supervision/safety;Sit to/from stand Lower Body Dressing Details (indicate cue type and reason): Supervision A for safety. No AD. Toilet Transfer: Copy Details (indicate cue type and reason): Simulated with transfer to recliner with supervision A for safety.           General ADL Comments: Patient with noted cognitive deficits including decreased STM. Will continue to assesse higher level cognition in prep for IADLs including med management and money management.     Vision Baseline Vision/History: Wears glasses Patient Visual Report: No change from baseline       Perception     Praxis      Pertinent Vitals/Pain Pain Assessment: No/denies pain     Hand Dominance Right   Extremity/Trunk Assessment Upper Extremity Assessment Upper Extremity Assessment: Overall WFL for tasks assessed   Lower Extremity Assessment Lower Extremity Assessment: Defer to PT evaluation   Cervical / Trunk Assessment Cervical / Trunk Assessment: Normal   Communication Communication Communication: No  difficulties   Cognition Arousal/Alertness: Awake/alert Behavior During Therapy: WFL for tasks assessed/performed Overall Cognitive Status: No family/caregiver present to determine baseline cognitive functioning Area of Impairment: Memory                     Memory: Decreased short-term memory         General Comments: Patient A&Ox4. Noted STM deficits. Patient scored 13 on the Short Blessed Test.  Scores of 10 or more indicate cognitive impairment.   General Comments       Exercises     Shoulder Instructions      Home Living Family/patient expects to be discharged to:: Private residence Living Arrangements: Spouse/significant other;Children (3 adult children) Available Help at Discharge: Family;Available 24 hours/day Type of Home: House Home Access: Stairs to enter [employer] of Steps: 2 Entrance Stairs-Rails: None       Bathroom Shower/Tub: Occupational psychologist: Standard     Home Equipment: Shower seat - built in;Cane - single point;Walker - 2 wheels;Wheelchair - manual          Prior Functioning/Environment Level of Independence: Independent        Comments: 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        OT Problem List: Decreased cognition      OT Treatment/Interventions: Self-care/ADL training;Therapeutic activities;Therapeutic exercise;Cognitive remediation/compensation;Patient/family education    OT Goals(Current goals can be found in the care plan section) Acute Rehab OT Goals Patient Stated Goal: To return home. OT Goal Formulation: With patient Time For Goal Achievement: 06/23/20 Potential to Achieve Goals: Good ADL Goals Additional ADL Goal #1: Patient will complete a.m. ADLs with I in prep for safe d/c home. Additional ADL Goal #2: Patient/family will recall 3 compensatory strategies to maximize safety and independence with IADLs including med management and money management.  OT Frequency: Min 2X/week   Barriers to D/C:            Co-evaluation              AM-PAC OT "6 Clicks" Daily Activity     Outcome Measure Help from another person eating meals?: None Help from another person taking care of personal grooming?: A Little Help from another person toileting, which includes using toliet, bedpan, or urinal?: A Little Help from another person  bathing (including washing, rinsing, drying)?: A Little Help from another person to put on and taking off regular upper body clothing?: A Little Help from another person to put on and taking off regular lower body clothing?: A Little 6 Click Score: 19   End of Session Equipment Utilized During Treatment: Gait belt Nurse Communication: Mobility status  Activity Tolerance: Patient tolerated treatment well Patient left: in chair;with call bell/phone within reach;with chair alarm set  OT Visit Diagnosis: Muscle weakness (generalized) (M62.81)                Time: 3536-1443 OT Time Calculation (min): 25 min Charges:  OT General Charges $OT Visit: 1 Visit OT Evaluation $OT Eval Low Complexity: 1 Low OT Treatments $Therapeutic Activity: 8-22 mins  Jamyah Folk H. OTR/L Supplemental OT, Department of rehab services [phone]  Lisvet Rasheed R H. 06/09/2020, 10:37 AM

## 2020-06-09 NOTE — Hospital Course (Signed)
61 E. [place] male home dwelling Sustained COVID 03/2020 CABG 2008 HTN HLD DM TY 2 History of bilateral mild carotid stenosis based on 2018 carotid ultrasounds TKR 12/18/2018 Last admitted 01/29/2017 for nonspecific chest pain  Recent Rx Dr. Vicente Serene office visit 05/25/2020 for bronchitis-Decadron azithromycin Tessalon Phenergan given  Present 3/31 with confusion-patient urinated on floor found in bed naked CBG 441 Paucity of verbal output Neurology consulted as concerns for stroke Meningioma found on MRI brain  Contrast MRI requested EEG requested ammonia requested and found to be 70

## 2020-06-09 NOTE — Progress Notes (Signed)
Inpatient Diabetes Program Recommendations  AACE/ADA: New Consensus Statement on Inpatient Glycemic Control (2015)  Target Ranges:  Prepandial:   less than 140 mg/dL      Peak postprandial:   less than 180 mg/dL (1-2 hours)      Critically ill patients:  140 - 180 mg/dL   Lab Results  Component Value Date   GLUCAP 72 06/09/2020   HGBA1C 10.3 (H) 06/08/2020    Review of Glycemic Control  Diabetes history: DM2 Outpatient Diabetes medications: Jardiance 25 mg QD, Amaryl 4 mg BID, metformin 500 mg BID Current orders for Inpatient glycemic control: Lantus 10 units QHS, Novolog 0-15 units TID with meals, metformin 1000 mg BID  HgbA1C - 10.3% CBGs 119, 158, 72 Total Novolog correction insulin thus far today - 5 units  Inpatient Diabetes Program Recommendations:     Agree with orders. Blood sugars trending well.  Pt states he has not been checking blood sugars at home recently. Has been drinking sweet tea. Discussed HgbA1C of 10.3%, indicating average blood sugar of 249 mg/dL. Reviewed glucose and HgbA1C goals.Has glucose meter at home. Instructed to check blood sugars at least 2x/day and record in logbook. Take logbook to PCP for review and adjustments. Discussed impact of nutrition, exercise, stress, sickness, and medications on diabetes control. Educated patient, spouse, and daughter on insulin pen use at home. Reviewed all steps if insulin pen including attachment of needle, 2-unit air shot, dialing up dose, giving injection, removing needle, disposal of sharps, storage of unused insulin, disposal of insulin etc. Patient able to provide successful return demonstration. Also reviewed troubleshooting with insulin pen. MD to give patient Rxs for insulin pens and insulin pen needles.  Answered questions. Pt seems motivated to make necessary changes to control his blood sugars, to reduce his risks of complications from diabetes.  Will continue to follow.  Thank you. Lorenda Peck, RD,  LDN, CDE Inpatient Diabetes Coordinator [phone]

## 2020-06-09 NOTE — Consult Note (Signed)
Cardiology Consultation:   Patient ID: William Haney; [id number]; [birth year]   Admit date: 06/08/2020 Date of Consult: 06/09/2020  Primary Care Provider: Unk Pinto, MD Primary Cardiologist: Dr. Minus Breeding, MD   Patient Profile:   William Haney is a 70 y.o. male with a hx of CAD s/p CABG 2008 with LIMA>>LAD, SVG>Diag, SVG>OM, SVG>PL, ischemic cardiomyopathy with initial EF at 35%>>normalized to 55% by 2009, carotid artery disease with LICA at 0-62% and RICA at 40-59%, HTN, HLD, and DM2 who is being seen today for the evaluation of CHF at the request of Dr. Verlon Au.  History of Present Illness:   William Haney is a 70 yo M with a hx as stated above who presented to [facility] 06/08/20 with AMS since Thursday morning. Family at bedside who help with HPI and events leading to his hospitalization. He was recently seen by his PCP Monday for persistent cough at which time labs and an EKG (not in Epic) were performed. HbA1c found to be 10.0 and he was prescribed insulin. He was ultimately dx with bronchitis and treated with Azithromycin, dexamethasone, tessalon perles, and phenergan and codeine. He then became acutely confused on Thursday with delirium and was ultimately brought to the by his family for further workup.   In the ED, he was more confused, found naked and had urinated on the floor. Head CT showed left occipital low density concerning for infarction of indeterminate age. Neurology was consulted at which time a contrast brain MRI and EEG. Ammonia found to be 70 and BS was markedly elevated on presentation at 363. MRI showed severe stenosis of the non-dominant V4 right vertebral artery>>new from the prior MRA head of 04/04/2007 and severe stenosis within the P4 left posterior cerebral artery is also new from this prior exam. Brain MRI with contrast confirmed meningioma overlying the right occipital lobe. Plan to hold on hyperammonemia until workup for meningioma complete. Echocardiogram  performed yesterday showed an LVEF at 45-50% with RWMA>hypokinesis of the left ventricular, entire inferoseptal wall, inferior wall and inferolateral wall, mildly elevated PASP, trivial MR, and moderate AR which appears to be relatively unchanged from prior echo in 2009 as below. On my evaluation, he is alert and oriented and answering all questions appropriately. He specifically denies chest pain or other anginal equivalents. He denies SOB, LE edema, orthopnea, palpitations or syncope.   He is followed by Dr. Percival Spanish for his cardiology care. He quit smoking in 2007 and does not drink alcohol. He was last seen 05/07/2018 in follow up at which time he was doing well from a CV standpoint with no changes in therapy. Follow up carotid dopplers were ordered and performed 09/06/19 that showed stable RICA at 37-62% and LICA at 8-31%, consistent with prior study.   Past Medical History:  Diagnosis Date  . Anemia   . Carotid artery stenosis 01/2017   L-ICA 1-39%, R-ICA 40-59% doppler  . Coronary artery disease    s/p CABG in 2008, w/ LIMA-LAD, SVG-Diag, SVG-OM, SVG-PL  . Diabetes mellitus type II   . Hyperlipidemia   . Hypertension   . Vitamin D deficiency     Past Surgical History:  Procedure Laterality Date  . CORONARY ARTERY BYPASS GRAFT  2008   LIMA to the LAD, saphenous vein graft to diagonal, saphenous vein graft to OM, saphenous vein graft to posterolateral 2008  . TOTAL KNEE ARTHROPLASTY Left 12/18/2018   Procedure: TOTAL KNEE ARTHROPLASTY;  Surgeon: Dorna Leitz, MD;  Location: [facility];  Service: Orthopedics;  Laterality: Left;     Prior to Admission medications   Medication Sig Start Date End Date Taking? Authorizing Provider  aspirin EC 81 MG tablet Take 81 mg by mouth daily. Swallow whole.   Yes [provider]  blood glucose meter kit and supplies Test sugars once to twice daily 08/24/18  Yes Vladimir Crofts, PA-C  Cholecalciferol (VITAMIN D3) 125 MCG (5000 UT) CAPS Take 5,000  Units by mouth daily.   Yes [provider]  Cinnamon 500 MG capsule Take 2 capsules (1,000 mg total) by mouth 2 (two) times daily. Patient taking differently: Take 500 mg by mouth daily. 02/05/17  Yes Liane Comber, NP  cyclobenzaprine (FLEXERIL) 10 MG tablet TAKE 1/2 TO 1 TABLET BY MOUTH 3 TIMES A DAY AS NEEDED FOR MUSCLE SPASMS Patient taking differently: Take 5-10 mg by mouth 3 (three) times daily as needed for muscle spasms. 03/28/20  Yes McClanahan, Danton Sewer, NP  empagliflozin (JARDIANCE) 25 MG TABS tablet Take 1 tablet (25 mg total) by mouth daily. 02/21/20  Yes Liane Comber, NP  glimepiride (AMARYL) 4 MG tablet Take  1 tablet  2 x /day  with Meals  for Diabetes Patient taking differently: Take 4 mg by mouth in the morning and at bedtime. 05/06/20  Yes Unk Pinto, MD  metFORMIN (GLUCOPHAGE) 500 MG tablet TAKE 2 TABLETS TWICE A DAY WITH MEALS FOR DIABETES Patient taking differently: Take 1,000 mg by mouth 2 (two) times daily with a meal. 02/22/20  Yes Unk Pinto, MD  metoprolol tartrate (LOPRESSOR) 25 MG tablet Take      1 tablet       2 x /day (every 12 hours)        for BP Patient taking differently: Take 25 mg by mouth 2 (two) times daily. 02/22/20  Yes Unk Pinto, MD  Multiple Vitamin (MULTIVITAMIN) tablet Take 1 tablet by mouth daily.   Yes [provider]  rosuvastatin (CRESTOR) 20 MG tablet TAKE 1 TABLET BY MOUTH EVERY DAY FOR CHOLESTEROL Patient taking differently: Take 20 mg by mouth daily. 05/21/19  Yes [name] R, PA-C  telmisartan (MICARDIS) 20 MG tablet TAKE 1 TABLET DAILY FOR BP & KIDNEY PROTECTION Patient taking differently: Take 20 mg by mouth daily. 02/22/20  Yes Unk Pinto, MD    Inpatient Medications: Scheduled Meds: .  stroke: mapping our early stages of recovery book   Does not apply Once  . aspirin EC  81 mg Oral Daily  . enoxaparin (LOVENOX) injection  40 mg Subcutaneous Daily  . insulin aspart  0-15 Units  Subcutaneous TID WC  . insulin glargine  10 Units Subcutaneous QHS  . irbesartan  75 mg Oral Daily  . lactulose  20 g Oral BID  . metFORMIN  1,000 mg Oral BID WC  . metoprolol tartrate  25 mg Oral BID  . rosuvastatin  20 mg Oral Daily  . [START ON 06/17/2020] thiamine injection  100 mg Intravenous Daily   Continuous Infusions: . sodium chloride 50 mL/hr at 06/09/20 1249  . thiamine injection 500 mg (06/09/20 1300)   Followed by  . [START ON 06/11/2020] thiamine injection     PRN Meds: acetaminophen **OR** acetaminophen (TYLENOL) oral liquid 160 mg/5 mL **OR** acetaminophen, cyclobenzaprine, senna-docusate  Allergies:    Allergies  Allergen Reactions  . Ace Inhibitors Cough  . Statins Other (See Comments)    Elevated LFT's    Social History:   Social History   Socioeconomic History  . Marital status: Married  Spouse name: Not on file  . Number of children: Not on file  . Years of education: Not on file  . Highest education level: Not on file  Occupational History  . Occupation: retired  Tobacco Use  . Smoking status: Former Smoker    Packs/day: 1.00    Years: 34.00    Pack years: 34.00    Start date: 46    Quit date: 08/28/2005    Years since quitting: 14.7  . Smokeless tobacco: Never Used  Vaping Use  . Vaping Use: Never used  Substance and Sexual Activity  . Alcohol use: No  . Drug use: No  . Sexual activity: Yes  Other Topics Concern  . Not on file  Social History Narrative  . Not on file   Social Determinants of Health   Financial Resource Strain: Not on file  Food Insecurity: Not on file  Transportation Needs: Not on file  Physical Activity: Not on file  Stress: Not on file  Social Connections: Not on file  Intimate Partner Violence: Not on file    Family History:   Family History  Problem Relation Age of Onset  . Hypertension Father   . Heart disease Father   . Hyperlipidemia Father   . Heart attack Father   . Diabetes Sister   . Heart  disease Brother    Family Status:  Family Status  Relation Name Status  . Mother  Deceased  . Father  Deceased  . MGM  Deceased  . MGF  Deceased  . PGM  Deceased  . PGF  Deceased  . Sister  (Not Specified)  . Brother  (Not Specified)    ROS:  Please see the history of present illness.  All other ROS reviewed and negative.     Physical Exam/Data:   Vitals:   06/08/20 2358 06/09/20 0405 06/09/20 0803 06/09/20 1100  BP: (!) 153/93 (!) 152/91 (!) 155/87 139/72  Pulse: 90 92 97 (!) 106  Resp: 16 18 18 18   Temp: 99 F (37.2 C) 98.5 F (36.9 C) 100.1 F (37.8 C) 98.2 F (36.8 C)  TempSrc:   Oral Oral  SpO2: 100% 99% 99% 100%  Weight:      Height:        Intake/Output Summary (Last 24 hours) at 06/09/2020 1408 Last data filed at 06/09/2020 0600 Gross per 24 hour  Intake 951.26 ml  Output 400 ml  Net 551.26 ml   Filed Weights   06/08/20 0613  Weight: 55.8 kg   Body mass index is 23.24 kg/m.   General: Well developed, well nourished, NAD Neck: Mild right carotid bruit. No JVD Lungs:Clear to ausculation bilaterally. No wheezes, rales, or rhonchi. Breathing is unlabored. Cardiovascular: RRR with S1 S2. No murmur Abdomen: Soft, non-tender, non-distended. No obvious abdominal masses. Extremities: No edema. Radial  pulses 2+ bilaterally Neuro: Alert and oriented. No focal deficits. No facial asymmetry. MAE spontaneously. Psych: Responds to questions appropriately with normal affect.     EKG:  The EKG was personally reviewed and demonstrates:  06/09/20 NSR with TWI in leads III, V5-V6 which appear to be present on last tracing 04/17/20 Telemetry:  Telemetry was personally reviewed and demonstrates:  Not currently on telemetry>>going to [place]   Relevant CV Studies:  Echocardiogram 06/08/20:  1. Left ventricular ejection fraction, by estimation, is 45 to 50%. The  left ventricle has mildly decreased function. The left ventricle  demonstrates regional wall motion abnormalities  (see scoring  diagram/findings for description).  Left ventricular  diastolic parameters are indeterminate. There is hypokinesis of the left  ventricular, entire inferoseptal wall, inferior wall and inferolateral  wall.  2. Right ventricular systolic function is normal. The right ventricular  size is normal. There is mildly elevated pulmonary artery systolic  pressure.  3. Left atrial size was mildly dilated.  4. The mitral valve is grossly normal. Trivial mitral valve  regurgitation.  5. The aortic valve is tricuspid. There is moderate calcification of the  aortic valve. There is moderate thickening of the aortic valve. Aortic  valve regurgitation is mild to moderate.  6. The inferior vena cava is normal in size with greater than 50%  respiratory variability, suggesting right atrial pressure of 3 mmHg.   Echocardiogram 01/28/2008:  SUMMARY  - LVEF is approximately 55% with hypokinesis/akinesis of the  posterior (base, mid, distal), basal inferior, basal  inferoseptal wall. Left ventricular wall thickness was mildly  increased.    Echocardiogram 09/03/2006:  SUMMARY  - Overall left ventricular systolic function was mildly to  moderately decreased. Left ventricular ejection fraction was  estimated , range being 35 % to 45 %. There was akinesis of  the basal-mid posterolateral wall. There was hypokinesis of  the basal lateral wall.  - Aortic valve thickness was mildly increased.  - There was mild mitral valvular regurgitation.   Laboratory Data:  Chemistry Recent Labs  Lab 06/05/20 1056 06/08/20 0608 06/09/20 0335  NA 138 135 139  K 5.4* 4.5 4.1  CL 102 105 106  CO2 27 23 23   GLUCOSE 184* 363* 151*  BUN 14 18 14   CREATININE 1.00 1.06 0.97  CALCIUM 9.6 8.9 8.9  GFRNONAA 75 >60 >60  GFRAA 87  --   --   ANIONGAP  --  7 10    Total Protein  Date Value Ref Range Status  06/09/2020 6.3 (L) 6.5 - 8.1 g/dL Final   Albumin  Date Value Ref Range Status  06/09/2020  3.0 (L) 3.5 - 5.0 g/dL Final   AST  Date Value Ref Range Status  06/09/2020 12 (L) 15 - 41 U/L Final   ALT  Date Value Ref Range Status  06/09/2020 13 0 - 44 U/L Final   Alkaline Phosphatase  Date Value Ref Range Status  06/09/2020 61 38 - 126 U/L Final   Total Bilirubin  Date Value Ref Range Status  06/09/2020 1.0 0.3 - 1.2 mg/dL Final   Hematology Recent Labs  Lab 06/05/20 1056 06/08/20 0608  WBC 12.5* 10.8*  RBC 5.99* 5.34  HGB 11.8* 10.6*  HCT 39.9 34.5*  MCV 66.6* 64.6*  MCH 19.7* 19.9*  MCHC 29.6* 30.7  RDW 17.9* 17.8*  PLT 561* 435*   Cardiac EnzymesNo results for input(s): TROPONINI in the last 168 hours. No results for input(s): TROPIPOC in the last 168 hours.  BNPNo results for input(s): BNP, PROBNP in the last 168 hours.  DDimer No results for input(s): DDIMER in the last 168 hours. TSH:  Lab Results  Component Value Date   TSH 1.809 06/08/2020   Lipids: Lab Results  Component Value Date   CHOL 154 06/09/2020   HDL 28 (L) 06/09/2020   LDLCALC 95 06/09/2020   TRIG 157 (H) 06/09/2020   CHOLHDL 5.5 06/09/2020   HgbA1c: Lab Results  Component Value Date   HGBA1C 10.3 (H) 06/08/2020    Radiology/Studies:  DG Chest 2 View  Result Date: 06/08/2020 CLINICAL DATA:  70 year old male with altered mental status.  Cough. EXAM: CHEST -  2 VIEW COMPARISON:  Chest radiographs 06/05/2020 and earlier. FINDINGS: Upright PA and lateral views. Sequelae of CABG. Normal cardiac size and mediastinal contours. Stable lung volumes and ventilation with no pneumothorax, pulmonary edema, pleural effusion or confluent pulmonary opacity. Stable visualized osseous structures. Healing right lateral rib fractures again noted. Negative visible bowel gas pattern. IMPRESSION: No acute cardiopulmonary abnormality. Healing right rib fractures again noted. Electronically Signed   By: Genevie Ann M.D.   On: 06/08/2020 07:58   CT Head Wo Contrast  Result Date: 06/08/2020 CLINICAL DATA:   Altered mental status. EXAM: CT HEAD WITHOUT CONTRAST TECHNIQUE: Contiguous axial images were obtained from the base of the skull through the vertex without intravenous contrast. COMPARISON:  April 03, 2007. FINDINGS: Brain: Mild diffuse cortical atrophy is noted. Mild chronic ischemic white matter disease is noted. Left occipital low density is noted concerning for infarction of indeterminate age. No midline shift is noted. Ventricular size is within normal limits. No hemorrhage is noted. Vascular: No hyperdense vessel or unexpected calcification. Skull: Normal. Negative for fracture or focal lesion. Sinuses/Orbits: Bilateral maxillary sinusitis is noted. Other: None. IMPRESSION: Left occipital low density is noted concerning for infarction of indeterminate age. MRI is recommended for further evaluation. Electronically Signed   By: Marijo Conception M.D.   On: 06/08/2020 10:02   MR ANGIO HEAD WO CONTRAST  Result Date: 06/08/2020 CLINICAL DATA:  Transient ischemic attack (TIA); incontinence, generalized weakness, gait disturbance. Neuro deficit, acute, stroke suspected. EXAM: MRI HEAD WITHOUT CONTRAST MRA HEAD WITHOUT CONTRAST TECHNIQUE: Multiplanar, multiecho pulse sequences of the brain and surrounding structures were obtained without intravenous contrast. Angiographic images of the head were obtained using MRA technique without contrast. COMPARISON:  Noncontrast head CT performed earlier today 06/08/2020, MRI/MRA head 04/04/2007. FINDINGS: MRI HEAD FINDINGS Brain: Intermittently motion degraded examination. Most notably, there is moderate/severe motion degradation of the axial T2 weighted sequence. Moderate to moderately advanced cerebral atrophy. Commensurate prominence of the ventricles and sulci. Comparatively mild cerebellar atrophy. 0.6 x 2.0 cm (AP x TV) extra-axial focus of signal abnormality overlying the right parietal lobe. This focus is intermediate in signal on the T2/FLAIR sequence and also  demonstrates restricted diffusion (for instance as seen on series 9, image 19) (series 4, image 37). In retrospect, this finding is also appreciable on the head CT performed earlier today, with intermediate density on this prior exam. This finding is new as compared to the brain MRI of 04/04/2007. No mass effect upon the underlying right parietal lobe. Small cortically based left occipital lobe infarct (PCA vascular territory), new from the prior brain MRI. Stable mild multifocal T2/FLAIR hyperintensity within the bilateral cerebral white matter which is nonspecific, but compatible with chronic small vessel ischemic disease. Redemonstration of multiple small chronic infarcts within the right greater than left cerebellar hemispheres. There is no acute infarct. No midline shift. Vascular: Expected proximal arterial flow voids. Skull and upper cervical spine: No appreciable focal marrow lesion. Sinuses/Orbits: Visualized orbits show no acute finding. Paranasal sinus disease most notably as follows. Complete T2 hyperintense opacification of the right frontal sinus. Near complete T2 hyperintense opacification of the anterior right ethmoid air cells. Moderate mucosal thickening and fluid level within the right maxillary sinus. Mild-to-moderate left maxillary sinus mucosal thickening. MRA HEAD FINDINGS The intracranial internal carotid arteries are patent. The M1 middle cerebral arteries are patent. No M2 proximal branch occlusion or high-grade proximal stenosis is identified. The anterior cerebral arteries are patent. Dominant intracranial left vertebral artery. Severe stenosis of  the mid to distal V4 right vertebral artery, new from the prior examination of 04/04/2007. The dominant intracranial left vertebral artery is patent without significant stenosis. The basilar artery is patent. The posterior cerebral arteries are patent. Fetal origin right posterior cerebral artery. Severe stenosis within the P4 left posterior  cerebral artery, new from the prior exam (series 306, image 20). A left posterior communicating artery is present. IMPRESSION: MRI brain: 1. Intermittently motion degraded examination, as described and limiting evaluation. 2. No evidence of acute infarction. 3. 0.6 x 2.0 cm extra-axial focus of signal abnormality with restricted diffusion overlying the right occipital lobe. This may reflect an extra-axial mass such as meningioma, but is incompletely characterized on this non-contrast exam. Postcontrast MR imaging of the brain is recommended for further evaluation and to exclude alternative etiologies (such as an extra-axial collection). 4. Small chronic left occipital lobe cortical infarct, new from the brain MRI of 04/04/2007. 5. Stable mild cerebral white matter chronic small vessel ischemic disease. 6. Redemonstrated small chronic infarcts within the right greater than left cerebellar hemispheres. 7. Progressive moderate to moderately advanced cerebral atrophy. Comparatively mild cerebellar atrophy. 8. Significant paranasal sinus disease, as described. MRA head: 1. No intracranial large vessel occlusion. 2. Severe stenosis of the non-dominant V4 right vertebral artery, new from the prior MRA head of 04/04/2007. 3. A severe stenosis within the P4 left posterior cerebral artery is also new from this prior exam. Electronically Signed   By: Kellie Simmering DO   On: 06/08/2020 15:07   MR BRAIN WO CONTRAST  Result Date: 06/08/2020 CLINICAL DATA:  Transient ischemic attack (TIA); incontinence, generalized weakness, gait disturbance. Neuro deficit, acute, stroke suspected. EXAM: MRI HEAD WITHOUT CONTRAST MRA HEAD WITHOUT CONTRAST TECHNIQUE: Multiplanar, multiecho pulse sequences of the brain and surrounding structures were obtained without intravenous contrast. Angiographic images of the head were obtained using MRA technique without contrast. COMPARISON:  Noncontrast head CT performed earlier today 06/08/2020, MRI/MRA  head 04/04/2007. FINDINGS: MRI HEAD FINDINGS Brain: Intermittently motion degraded examination. Most notably, there is moderate/severe motion degradation of the axial T2 weighted sequence. Moderate to moderately advanced cerebral atrophy. Commensurate prominence of the ventricles and sulci. Comparatively mild cerebellar atrophy. 0.6 x 2.0 cm (AP x TV) extra-axial focus of signal abnormality overlying the right parietal lobe. This focus is intermediate in signal on the T2/FLAIR sequence and also demonstrates restricted diffusion (for instance as seen on series 9, image 19) (series 4, image 37). In retrospect, this finding is also appreciable on the head CT performed earlier today, with intermediate density on this prior exam. This finding is new as compared to the brain MRI of 04/04/2007. No mass effect upon the underlying right parietal lobe. Small cortically based left occipital lobe infarct (PCA vascular territory), new from the prior brain MRI. Stable mild multifocal T2/FLAIR hyperintensity within the bilateral cerebral white matter which is nonspecific, but compatible with chronic small vessel ischemic disease. Redemonstration of multiple small chronic infarcts within the right greater than left cerebellar hemispheres. There is no acute infarct. No midline shift. Vascular: Expected proximal arterial flow voids. Skull and upper cervical spine: No appreciable focal marrow lesion. Sinuses/Orbits: Visualized orbits show no acute finding. Paranasal sinus disease most notably as follows. Complete T2 hyperintense opacification of the right frontal sinus. Near complete T2 hyperintense opacification of the anterior right ethmoid air cells. Moderate mucosal thickening and fluid level within the right maxillary sinus. Mild-to-moderate left maxillary sinus mucosal thickening. MRA HEAD FINDINGS The intracranial internal carotid arteries are  patent. The M1 middle cerebral arteries are patent. No M2 proximal branch occlusion  or high-grade proximal stenosis is identified. The anterior cerebral arteries are patent. Dominant intracranial left vertebral artery. Severe stenosis of the mid to distal V4 right vertebral artery, new from the prior examination of 04/04/2007. The dominant intracranial left vertebral artery is patent without significant stenosis. The basilar artery is patent. The posterior cerebral arteries are patent. Fetal origin right posterior cerebral artery. Severe stenosis within the P4 left posterior cerebral artery, new from the prior exam (series 306, image 20). A left posterior communicating artery is present. IMPRESSION: MRI brain: 1. Intermittently motion degraded examination, as described and limiting evaluation. 2. No evidence of acute infarction. 3. 0.6 x 2.0 cm extra-axial focus of signal abnormality with restricted diffusion overlying the right occipital lobe. This may reflect an extra-axial mass such as meningioma, but is incompletely characterized on this non-contrast exam. Postcontrast MR imaging of the brain is recommended for further evaluation and to exclude alternative etiologies (such as an extra-axial collection). 4. Small chronic left occipital lobe cortical infarct, new from the brain MRI of 04/04/2007. 5. Stable mild cerebral white matter chronic small vessel ischemic disease. 6. Redemonstrated small chronic infarcts within the right greater than left cerebellar hemispheres. 7. Progressive moderate to moderately advanced cerebral atrophy. Comparatively mild cerebellar atrophy. 8. Significant paranasal sinus disease, as described. MRA head: 1. No intracranial large vessel occlusion. 2. Severe stenosis of the non-dominant V4 right vertebral artery, new from the prior MRA head of 04/04/2007. 3. A severe stenosis within the P4 left posterior cerebral artery is also new from this prior exam. Electronically Signed   By: Kellie Simmering DO   On: 06/08/2020 15:07   MR BRAIN W CONTRAST  Result Date:  06/08/2020 CLINICAL DATA:  Stroke follow-up EXAM: MRI HEAD WITH CONTRAST TECHNIQUE: Multiplanar, multiecho pulse sequences of the brain and surrounding structures were obtained with intravenous contrast. CONTRAST:  5.61m GADAVIST GADOBUTROL 1 MMOL/ML IV SOLN COMPARISON:  06/08/2020 FINDINGS: Diffusely enhancing 17 x 6 mm meningioma overlying the right occipital lobe. No mass effect on the underlying brain. Otherwise, no contrast-enhancing lesion. Otherwise unchanged findings compared to the noncontrast examination earlier today. IMPRESSION: Confirmation of meningioma overlying the right occipital lobe. Electronically Signed   By: KUlyses JarredM.D.   On: 06/08/2020 21:52   EEG adult  Result Date: 06/09/2020 SMickie Hillier MD     06/09/2020  1:04 PM EEG Procedure: Date:  06/09/20 Duration: 24.45 Clinical indication: suspected encephalopathy; evaluate background; suspected seizures r/o epileptiform discharges Clinical state:  awake and briefly drowsy EEG Procedure: This is a digitally recorded routine electroencephalogram.  The international 10-20 electrode placement system is used for scalp electrode placement.  Eighteen channels of scalp EEG are recorded.  The data is stored digitally and reviewed in reformatted montages for optimal display. EEG Description: This EEG is well organized.  There is a well formed, well sustained and symmetrical 6-7 Hz posterior dominant rhythm with eye opening and drowsiness.  The background consisted of theta, some delta, alpha and beta frequencies of medium voltage.  During drowsiness, background slowing appeared.  Stage 2 sleep was not recorded.  There were no focal abnormalities, persistent asymmetries or epileptiform discharges. Activating procedures:  Photic stimulation did not alter the recording.  Hyperventilation was not performed. EKG:  The EKG rhythm strip demonstrated NSR at 96bpm. EEG Classification:  Abnormal 1.  Background slowing, generalized EEG Interpretation: This  routine EEG recorded in the awake and drowsy states  is abnormal.  The above findings suggest diffuse and/or multifocal cerebral dysfunction.  There were no epileptiform discharges.   ECHOCARDIOGRAM COMPLETE  Result Date: 06/08/2020    ECHOCARDIOGRAM REPORT   Patient Name:   DAJUAN TURNLEY Date of Exam: 06/08/2020 Medical Rec #:  [id number]    Height:       61.0 in Accession #:    [id number]   Weight:       123.0 lb Date of Birth:  1950/09/04    BSA:          1.536 m Patient Age:    41 years     BP:           140/84 mmHg Patient Gender: M            HR:           98 bpm. Exam Location:  Inpatient Procedure: 2D Echo Indications:    TIA  History:        Patient has prior history of Echocardiogram examinations, most                 recent 01/28/2008. Risk Factors:Hypertension, Dyslipidemia and                 Diabetes.  Sonographer:    Johny Chess Referring Phys: [place]  1. Left ventricular ejection fraction, by estimation, is 45 to 50%. The left ventricle has mildly decreased function. The left ventricle demonstrates regional wall motion abnormalities (see scoring diagram/findings for description). Left ventricular diastolic parameters are indeterminate. There is hypokinesis of the left ventricular, entire inferoseptal wall, inferior wall and inferolateral wall.  2. Right ventricular systolic function is normal. The right ventricular size is normal. There is mildly elevated pulmonary artery systolic pressure.  3. Left atrial size was mildly dilated.  4. The mitral valve is grossly normal. Trivial mitral valve regurgitation.  5. The aortic valve is tricuspid. There is moderate calcification of the aortic valve. There is moderate thickening of the aortic valve. Aortic valve regurgitation is mild to moderate.  6. The inferior vena cava is normal in size with greater than 50% respiratory variability, suggesting right atrial pressure of 3 mmHg. Comparison(s): No significant change from prior  study. Conclusion(s)/Recommendation(s): No intracardiac source of embolism detected on this transthoracic study. A transesophageal echocardiogram is recommended to exclude cardiac source of embolism if clinically indicated. FINDINGS  Left Ventricle: Left ventricular ejection fraction, by estimation, is 45 to 50%. The left ventricle has mildly decreased function. The left ventricle demonstrates regional wall motion abnormalities. The left ventricular internal cavity size was normal in size. There is no left ventricular hypertrophy. Left ventricular diastolic parameters are indeterminate. Right Ventricle: The right ventricular size is normal. No increase in right ventricular wall thickness. Right ventricular systolic function is normal. There is mildly elevated pulmonary artery systolic pressure. The tricuspid regurgitant velocity is 2.99  m/s, and with an assumed right atrial pressure of 3 mmHg, the estimated right ventricular systolic pressure is 38.8 mmHg. Left Atrium: Left atrial size was mildly dilated. Right Atrium: Right atrial size was normal in size. Pericardium: There is no evidence of pericardial effusion. Mitral Valve: The mitral valve is grossly normal. There is mild thickening of the mitral valve leaflet(s). There is moderate calcification of the mitral valve leaflet(s). Mild to moderate mitral annular calcification. Trivial mitral valve regurgitation. Tricuspid Valve: The tricuspid valve is normal in structure. Tricuspid valve regurgitation is mild. Aortic Valve: The aortic valve is tricuspid. There  is moderate calcification of the aortic valve. There is moderate thickening of the aortic valve. Aortic valve regurgitation is mild to moderate. Aortic regurgitation PHT measures 201 msec. Pulmonic Valve: The pulmonic valve was grossly normal. Pulmonic valve regurgitation is trivial. Aorta: The aortic root and ascending aorta are structurally normal, with no evidence of dilitation. Venous: The inferior vena  cava is normal in size with greater than 50% respiratory variability, suggesting right atrial pressure of 3 mmHg. IAS/Shunts: The atrial septum is grossly normal.  LEFT VENTRICLE PLAX 2D LVIDd:         5.30 cm     Diastology LVIDs:         4.10 cm     LV e' medial:  5.98 cm/s LV PW:         0.90 cm     LV e' lateral: 9.90 cm/s LV IVS:        0.90 cm LVOT diam:     1.90 cm LV SV:         46 LV SV Index:   30 LVOT Area:     2.84 cm  LV Volumes (MOD) LV vol d, MOD A2C: 78.4 ml LV vol d, MOD A4C: 72.9 ml LV vol s, MOD A2C: 37.3 ml LV vol s, MOD A4C: 43.5 ml LV SV MOD A2C:     41.1 ml LV SV MOD A4C:     72.9 ml LV SV MOD BP:      35.2 ml RIGHT VENTRICLE RV S prime:     12.60 cm/s TAPSE (M-mode): 1.4 cm LEFT ATRIUM             Index       RIGHT ATRIUM           Index LA diam:        4.20 cm 2.73 cm/m  RA Area:     10.60 cm LA Vol (A2C):   38.7 ml 25.19 ml/m RA Volume:   19.30 ml  12.56 ml/m LA Vol (A4C):   30.0 ml 19.53 ml/m LA Biplane Vol: 34.8 ml 22.65 ml/m  AORTIC VALVE LVOT Vmax:   86.70 cm/s LVOT Vmean:  60.000 cm/s LVOT VTI:    0.162 m AI PHT:      201 msec  AORTA Ao Root diam: 3.10 cm Ao Asc diam:  3.35 cm TRICUSPID VALVE TR Peak grad:   35.8 mmHg TR Vmax:        299.00 cm/s  SHUNTS Systemic VTI:  0.16 m Systemic Diam: 1.90 cm Buford Dresser MD Electronically signed by Buford Dresser MD Signature Date/Time: 06/08/2020/7:57:36 PM    Final    VAS US CAROTID (at [facility] and [facility] only)  Result Date: 06/09/2020 Carotid Arterial Duplex Study Indications:      TIA. Risk Factors:     Hypertension, hyperlipidemia, Diabetes, coronary artery                   disease. Comparison Study: 09/06/19 previous Performing Technologist: Abram Sander RVS  Examination Guidelines: A complete evaluation includes B-mode imaging, spectral Doppler, color Doppler, and power Doppler as needed of all accessible portions of each vessel. Bilateral testing is considered an integral part of a complete examination. Limited  examinations for reoccurring indications may be performed as noted.  Right Carotid Findings: +----------+--------+--------+--------+------------------+--------+           PSV cm/sEDV cm/sStenosisPlaque DescriptionComments +----------+--------+--------+--------+------------------+--------+ CCA Prox  68      12  heterogenous               +----------+--------+--------+--------+------------------+--------+ CCA Distal58      10              heterogenous               +----------+--------+--------+--------+------------------+--------+ ICA Prox  232     51      40-59%  heterogenous               +----------+--------+--------+--------+------------------+--------+ ICA Mid   76      21                                         +----------+--------+--------+--------+------------------+--------+ ICA Distal71      21                                         +----------+--------+--------+--------+------------------+--------+ ECA       101                                                +----------+--------+--------+--------+------------------+--------+ +----------+--------+-------+--------+-------------------+           PSV cm/sEDV cmsDescribeArm Pressure (mmHG) +----------+--------+-------+--------+-------------------+ [id number]                                         +----------+--------+-------+--------+-------------------+ +---------+--------+--+--------+-+---------+ VertebralPSV cm/s35EDV cm/s7Antegrade +---------+--------+--+--------+-+---------+  Left Carotid Findings: +----------+--------+--------+--------+------------------+--------+           PSV cm/sEDV cm/sStenosisPlaque DescriptionComments +----------+--------+--------+--------+------------------+--------+ CCA Prox  75      10              heterogenous               +----------+--------+--------+--------+------------------+--------+ CCA Distal73      17               heterogenous               +----------+--------+--------+--------+------------------+--------+ ICA Prox  96      21      1-39%   heterogenous               +----------+--------+--------+--------+------------------+--------+ ICA Distal106     33                                         +----------+--------+--------+--------+------------------+--------+ ECA       89                                                 +----------+--------+--------+--------+------------------+--------+ +----------+--------+--------+--------+-------------------+           PSV cm/sEDV cm/sDescribeArm Pressure (mmHG) +----------+--------+--------+--------+-------------------+ [id number]                                          +----------+--------+--------+--------+-------------------+ +---------+--------+--+--------+--+---------+  VertebralPSV cm/s71EDV cm/s20Antegrade +---------+--------+--+--------+--+---------+   Summary: Right Carotid: Velocities in the right ICA are consistent with a 40-59%                stenosis. Left Carotid: Velocities in the left ICA are consistent with a 1-39% stenosis. Vertebrals: Bilateral vertebral arteries demonstrate antegrade flow. *See table(s) above for measurements and observations.  Electronically signed by Antony Contras MD on 06/09/2020 at 11:36:30 AM.    Final     Assessment and Plan:   1. Cardiomyopathy with known CAD s/p CABG 2008: -Appears that he underwent an echocardiogram in 2008 at this time of his CABG which showed an LVEF at 35% to 45% with akinesis of the basal-mid posterolateral wall. There was hypokinesis of the basal lateral wall. He had some normalization by 2009 to 55% with hypokinesis/akinesis of the  posterior (base, mid, distal), basal inferior, basal. He presented to [facility] with AMS found to have elevated ammonia and BS levels with brain MRI showing meningioma with treatment plan per neurology. He appears to be back to his mental baseline.  He specifically denies anginal symptoms.   -He has been maintained on Telmisartan>>started on irbesartan>> which I would continue. -Echocardiogram this admission with LVEF at 45-50% with RWMA>hypokinesis of the left ventricular, entire inferoseptal wall, inferior wall and inferolateral wall, mildly elevated PASP, trivial MR, and moderate AR. He does not appear to have NYHA symptoms on exam today.  -Would start carvedilol 3.190m BID and titrate as tolerated  2. Acute encephalopathy: -Pt presented with AMS found to ultimately have meningeoma on brain MRI with neurology consulted with further plans pending  -Ammonia level found to be elevated with normal LFT with plans to follow with liver [place] INR>>hold workup until neuro workup complete -EEG   3. Carotid artery disease: -Last doppler study performed 09/06/19 that showed stable RICA at 491-47%and LICA at 18-29% consistent with prior study.  -Dopplers performed this admission seemed to be unchanged from above  -Continue ASA, statin, beta blocker  4. HLD: -LDL, 95 on 06/09/20 -On PTA Crestor 267mQD  -Will hold off on restarting and up titration until liver [place] hyperammonemia resolved   4. HTN: -Elevated, 139/81>>155/87>>153/93 -Continue irbesartan -Will add low dose carvedilol   5. DM2: -HbA1c, 10.3>>uncontrolled>>ongoing concern with last A1c's 10.3>>8.8>>9.0 -SSI for glucose control while inpatient status  -Management per primary team    For questions or updates, please contact [facility] consult [email or web address] for contact info under Cardiology/STEMI.   [facility] Pager: [phone]/03/2020 2:08 PM  History and all data above reviewed.  Patient examined.  I agree with the findings as above.  The patient presents with altered mental status and elevated blood sugars.  He is noted to have a slightly reduced ejection fraction compared to previous echo but better than he has had in the past  at the time of bypass.  He actually has done quite well recently from a cardiac standpoint.  He was out pushing a lawnmower recently.  The patient denies any new symptoms such as chest discomfort, neck or arm discomfort. There has been no new shortness of breath, PND or orthopnea. There have been no reported palpitations, presyncope or syncope.  He has had no acute EKG changes this admission.   Troponin is negative.  The patient exam reveals COR: Regular rate and rhythm,  Lungs: Clear to auscultation bilaterally,  Abd: Positive bowel sounds normal in frequency in pitch, Ext no edema.  All  available labs, radiology testing, previous records reviewed. Agree with documented assessment and plan.   Ischemic cardiomyopathy: Patient seems to be euvolemic.  He is asymptomatic.  We will add a low-dose beta-blocker but he can otherwise continue his ARB.  No further invasive or other testing is indicated.  Carotid stenosis: This is mild to moderate and unchanged from previous.  I will follow up with imaging in the future from our office. Jeneen Rinks Evanell Redlich  5:19 PM  06/09/2020

## 2020-06-09 NOTE — Evaluation (Addendum)
Speech Language Pathology Evaluation Patient Details Name: William Haney MRN: [id number] DOB: 1950-08-24 Today's Date: 06/09/2020 Time: 6546-5035 SLP Time Calculation (min) (ACUTE ONLY): 23 min  Problem List:  Patient Active Problem List   Diagnosis Date Noted  . ACS (acute coronary syndrome) ([place]) 06/09/2020  . Acute metabolic encephalopathy 46/56/8127  . COVID-19 (03/31/2020) 04/03/2020  . CKD stage 2 due to type 2 diabetes mellitus ([place]) 02/11/2020  . Aorto-iliac atherosclerosis ([place]) 05/26/2019  . Primary osteoarthritis of left knee 12/18/2018  . beta thalassemia minor (trait) anemia 03/08/2018  . Overweight (BMI 25.0-29.9) 08/10/2017  . Diabetic retinopathy of both eyes ([place]) 08/10/2017  . Poor compliance 09/09/2015  . DM type 2 (diabetes mellitus, type 2) ([place]) 09/08/2015  . Neuropathy, diabetic ([place]) 05/19/2015  . Encounter for Medicare annual wellness exam 09/21/2014  . Essential hypertension 10/11/2013  . Vitamin D deficiency 10/11/2013  . Medication management 10/11/2013  . Hyperlipidemia associated with type 2 diabetes mellitus ([place]) 03/01/2010  . Coronary atherosclerosis 03/01/2010  . Carotid stenosis 03/01/2010   Past Medical History:  Past Medical History:  Diagnosis Date  . Anemia   . Carotid artery stenosis 01/2017   L-ICA 1-39%, R-ICA 40-59% doppler  . Coronary artery disease    s/p CABG in 2008, w/ LIMA-LAD, SVG-Diag, SVG-OM, SVG-PL  . Diabetes mellitus type II   . Hyperlipidemia   . Hypertension   . Vitamin D deficiency    Past Surgical History:  Past Surgical History:  Procedure Laterality Date  . CORONARY ARTERY BYPASS GRAFT  2008   LIMA to the LAD, saphenous vein graft to diagonal, saphenous vein graft to OM, saphenous vein graft to posterolateral 2008  . TOTAL KNEE ARTHROPLASTY Left 12/18/2018   Procedure: TOTAL KNEE ARTHROPLASTY;  Surgeon: Dorna Leitz, MD;  Location: [facility];  Service: Orthopedics;  Laterality: Left;   HPI:  70 y.o. male  presenting with worsening AMS x several days. Patient admitted with acute metabolic encephalopathy with possible concern for early onset dementia.  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 Further work-up pending. PMHx significant for HTN, HLD, DM, COVID-19 03/2020, L TKA 12/2018, PVD with carotid stenosis, and CAD s/p CABG.   Assessment / Plan / Recommendation Clinical Impression  Pt demonstrated significant cognitive impairment on the SLUMS scoring a 16/30 with decreased intellectual awareness into challenges he had during evaluation. He recalled 1 of 5 words with decreased ability to retrieve from choice of 3. His problem solving, divergent naming, thought organization. He was 4/4 accurate with responsive naming for paragraph recall. William Haney speech is intelligible and appropriate use of language. Discussed his independence with managing finances and management of medicine. Recommend and educated family to supervise pt with these ADL's. If family wants to further investigate diagnosis of possible dementia educated re: neuropsychologist. Pt asked if there was medicine for memory deficits and educated to discuss with family.     SLP Assessment  SLP Recommendation/Assessment:  (see impressions) SLP Visit Diagnosis: Cognitive communication deficit (R41.841)    Follow Up Recommendations  Other (comment) (see impressions)    Frequency and Duration           SLP Evaluation Cognition  Overall Cognitive Status: No family/caregiver present to determine baseline cognitive functioning Arousal/Alertness: Awake/alert Orientation Level: Oriented X4 Attention: Sustained Sustained Attention: Appears intact Memory: Impaired Memory Impairment: Storage deficit;Retrieval deficit Awareness: Impaired Awareness Impairment: Intellectual impairment;Emergent impairment Problem Solving: Impaired Problem Solving Impairment: Verbal  basic Safety/Judgment:  (  needs some supervision)       Comprehension  Auditory Comprehension Overall Auditory Comprehension: Appears within functional limits for tasks assessed Visual Recognition/Discrimination Discrimination: Not tested Reading Comprehension Reading Status: Not tested    Expression Expression Primary Mode of Expression: Verbal Verbal Expression Overall Verbal Expression: Appears within functional limits for tasks assessed Naming: Impairment Divergent:  (4 items in category) Pragmatics: No impairment Written Expression Dominant Hand: Right Written Expression: Not tested   Oral / Motor  Oral Motor/Sensory Function Overall Oral Motor/Sensory Function: Within functional limits Motor Speech Overall Motor Speech: Appears within functional limits for tasks assessed Articulation: Within functional limitis Intelligibility: Intelligible Motor Planning: Witnin functional limits   GO                    Houston Siren 06/09/2020, 2:26 PM William Haney M.Ed Risk analyst [phone] Office [phone]

## 2020-06-09 NOTE — Progress Notes (Signed)
PROGRESS NOTE   William Haney  ENI:[id number] DOB: Jun 16, 1950 DOA: 06/08/2020 PCP: Unk Pinto, MD  Brief Narrative:  26 E. Asian male home dwelling Sustained COVID 03/2020 CABG 2008 HTN HLD DM TY 2 History of bilateral mild carotid stenosis based on 2018 carotid ultrasounds TKR 12/18/2018 Last admitted 01/29/2017 for nonspecific chest pain  Recent Rx Dr. Vicente Serene office visit 05/25/2020 for bronchitis-Decadron azithromycin Tessalon Phenergan given  Present 3/31 with confusion-patient urinated on floor found in bed naked CBG 441 Paucity of verbal output Neurology consulted as concerns for stroke Meningioma found on MRI brain  Contrast MRI requested EEG requested  ammonia requested and found to be 70  Hospital-Problem based course  Toxic metabolic encephalopathy secondary to hyperammonemia more likely than not Meningioma noted over right occipital lobe Await EEG Await further input from neurology regarding MRI with contrast study for meningioma I will discontinue Flexeril if he starts to exhibit further confusion Hyperammonemia with normal LFT pattern Etiology is unclear-we will order nonemergent ultrasound liver to evaluate get INR later today start lactulose 20 twice daily and we will see how the patient does in terms of mental status and changes Tells me he has never had hepatitis and has never had blood transfusion Hold on autoimmune work-up until meningioma work-up/neuro opinion revisited is complete Covid recovered 03/30/2020 Recent bronchitis 05/25/2020 Will need dedicated chest x-ray in the outpatient setting versus referral to pulmonary if this becomes a recurrent issue CABG 2008, mild bilateral carotid stenosis 2018 ACS? Continue aspirin 81 mg, Crestor 20, metoprolol prior to admission doses held for now Resume myocarditis 20 daily LvEF is depressed compared to prior Get troponin --will ask cardiology to comment=-Dr. Doylene Canard will see in consult DM TY 2 with  complication of hyperglycemia  Continue Lantus 10 units and sliding scale CBGs are currently well controlled between 119 and 170 Holding Jardiance 25 at this time, Amaryl 4 twice daily at this time Resume Metformin 500 twice daily   DVT prophylaxis: Lovenox Code Status: Full Family Communication: Discussed with daughter Sharyn Lull at the bedside in detail and with wife and other daughter Disposition:  Status is: Observation  The patient will require care spanning > 2 midnights and should be moved to inpatient because: Hemodynamically unstable, Ongoing active pain requiring inpatient pain management, Ongoing diagnostic testing needed not appropriate for outpatient work up and Unsafe d/c plan  Dispo: The patient is from: Home              Anticipated d/c is to: Home              Patient currently is not medically stable to d/c.   Difficult to place patient No   Consultants:   None as yet  Procedures:  3/31 chest x-ray healing right rib fractures 3/31 CT head -left occipital low-density area?  Indeterminate infarct 3/31 MRA head = severe stenosis nondominant V4 right vertebral artery, severe stenosis before left posterior cerebral artery 3/31 MR brain shows meningioma over right occipital lobe  17 X 6 mm Echocardiogram 4/1 =IMPRESSIONS    1. Left ventricular ejection fraction, by estimation, is 45 to 50%. The  left ventricle has mildly decreased function. The left ventricle  demonstrates regional wall motion abnormalities (see scoring  diagram/findings for description). Left ventricular  diastolic parameters are indeterminate. There is hypokinesis of the left  ventricular, entire inferoseptal wall, inferior wall and inferolateral  wall.  2. Right ventricular systolic function is normal. The right ventricular  size is normal. There is mildly elevated  pulmonary artery systolic  pressure.  3. Left atrial size was mildly dilated.  4. The mitral valve is grossly normal. Trivial  mitral valve  regurgitation.  5. The aortic valve is tricuspid. There is moderate calcification of the  aortic valve. There is moderate thickening of the aortic valve. Aortic  valve regurgitation is mild to moderate.  6. The inferior vena cava is normal in size with greater than 50%  respiratory variability, suggesting right atrial pressure of 3 mmHg.   Antimicrobials:     Subjective: Awake alert ambulatory seems coherent can tell me time date year Has some recollection of the events leading up to this and was aware that he was confused  Objective: Vitals:   06/08/20 2007 06/08/20 2358 06/09/20 0405 06/09/20 0803  BP: (!) 168/96 (!) 153/93 (!) 152/91 (!) 155/87  Pulse: (!) 110 90 92 97  Resp: 18 16 18 18   Temp: 98.8 F (37.1 C) 99 F (37.2 C) 98.5 F (36.9 C) 100.1 F (37.8 C)  TempSrc:    Oral  SpO2: 99% 100% 99% 99%  Weight:      Height:        Intake/Output Summary (Last 24 hours) at 06/09/2020 1056 Last data filed at 06/09/2020 0600 Gross per 24 hour  Intake 951.26 ml  Output 400 ml  Net 551.26 ml   Filed Weights   06/08/20 0613  Weight: 55.8 kg    Examination: Awake coherent no distress EOMI NCAT no flap No icterus no pallor Finger-nose-finger intact Power 5/5 Chest clear no added sound S1-S2 no murmur PVCs on monitors No lower extremity edema   Data Reviewed: personally reviewed   CBC    Component Value Date/Time   WBC 10.8 (H) 06/08/2020 0608   RBC 5.34 06/08/2020 0608   HGB 10.6 (L) 06/08/2020 0608   HCT 34.5 (L) 06/08/2020 0608   PLT 435 (H) 06/08/2020 0608   MCV 64.6 (L) 06/08/2020 0608   MCH 19.9 (L) 06/08/2020 0608   MCHC 30.7 06/08/2020 0608   RDW 17.8 (H) 06/08/2020 0608   LYMPHSABS 1.2 06/08/2020 0608   MONOABS 1.0 06/08/2020 0608   EOSABS 0.2 06/08/2020 0608   BASOSABS 0.1 06/08/2020 0608   CMP Latest Ref Rng & Units 06/09/2020 06/08/2020 06/05/2020  Glucose 70 - 99 mg/dL 151(H) 363(H) 184(H)  BUN 8 - 23 mg/dL 14 18 14    Creatinine 0.61 - 1.24 mg/dL 0.97 1.06 1.00  Sodium 135 - 145 mmol/L 139 135 138  Potassium 3.5 - 5.1 mmol/L 4.1 4.5 5.4(H)  Chloride 98 - 111 mmol/L 106 105 102  CO2 22 - 32 mmol/L 23 23 27   Calcium 8.9 - 10.3 mg/dL 8.9 8.9 9.6  Total Protein 6.5 - 8.1 g/dL 6.3(L) 6.1(L) 6.9  Total Bilirubin 0.3 - 1.2 mg/dL 1.0 0.5 0.5  Alkaline Phos 38 - 126 U/L 61 75 -  AST 15 - 41 U/L 12(L) 10(L) 11  ALT 0 - 44 U/L 13 12 12      Radiology Studies: DG Chest 2 View  Result Date: 06/08/2020 CLINICAL DATA:  70 year old male with altered mental status.  Cough. EXAM: CHEST - 2 VIEW COMPARISON:  Chest radiographs 06/05/2020 and earlier. FINDINGS: Upright PA and lateral views. Sequelae of CABG. Normal cardiac size and mediastinal contours. Stable lung volumes and ventilation with no pneumothorax, pulmonary edema, pleural effusion or confluent pulmonary opacity. Stable visualized osseous structures. Healing right lateral rib fractures again noted. Negative visible bowel gas pattern. IMPRESSION: No acute cardiopulmonary abnormality. Healing right  rib fractures again noted. Electronically Signed   By: Genevie Ann M.D.   On: 06/08/2020 07:58   CT Head Wo Contrast  Result Date: 06/08/2020 CLINICAL DATA:  Altered mental status. EXAM: CT HEAD WITHOUT CONTRAST TECHNIQUE: Contiguous axial images were obtained from the base of the skull through the vertex without intravenous contrast. COMPARISON:  April 03, 2007. FINDINGS: Brain: Mild diffuse cortical atrophy is noted. Mild chronic ischemic white matter disease is noted. Left occipital low density is noted concerning for infarction of indeterminate age. No midline shift is noted. Ventricular size is within normal limits. No hemorrhage is noted. Vascular: No hyperdense vessel or unexpected calcification. Skull: Normal. Negative for fracture or focal lesion. Sinuses/Orbits: Bilateral maxillary sinusitis is noted. Other: None. IMPRESSION: Left occipital low density is noted  concerning for infarction of indeterminate age. MRI is recommended for further evaluation. Electronically Signed   By: Marijo Conception M.D.   On: 06/08/2020 10:02   MR ANGIO HEAD WO CONTRAST  Result Date: 06/08/2020 CLINICAL DATA:  Transient ischemic attack (TIA); incontinence, generalized weakness, gait disturbance. Neuro deficit, acute, stroke suspected. EXAM: MRI HEAD WITHOUT CONTRAST MRA HEAD WITHOUT CONTRAST TECHNIQUE: Multiplanar, multiecho pulse sequences of the brain and surrounding structures were obtained without intravenous contrast. Angiographic images of the head were obtained using MRA technique without contrast. COMPARISON:  Noncontrast head CT performed earlier today 06/08/2020, MRI/MRA head 04/04/2007. FINDINGS: MRI HEAD FINDINGS Brain: Intermittently motion degraded examination. Most notably, there is moderate/severe motion degradation of the axial T2 weighted sequence. Moderate to moderately advanced cerebral atrophy. Commensurate prominence of the ventricles and sulci. Comparatively mild cerebellar atrophy. 0.6 x 2.0 cm (AP x TV) extra-axial focus of signal abnormality overlying the right parietal lobe. This focus is intermediate in signal on the T2/FLAIR sequence and also demonstrates restricted diffusion (for instance as seen on series 9, image 19) (series 4, image 37). In retrospect, this finding is also appreciable on the head CT performed earlier today, with intermediate density on this prior exam. This finding is new as compared to the brain MRI of 04/04/2007. No mass effect upon the underlying right parietal lobe. Small cortically based left occipital lobe infarct (PCA vascular territory), new from the prior brain MRI. Stable mild multifocal T2/FLAIR hyperintensity within the bilateral cerebral white matter which is nonspecific, but compatible with chronic small vessel ischemic disease. Redemonstration of multiple small chronic infarcts within the right greater than left cerebellar  hemispheres. There is no acute infarct. No midline shift. Vascular: Expected proximal arterial flow voids. Skull and upper cervical spine: No appreciable focal marrow lesion. Sinuses/Orbits: Visualized orbits show no acute finding. Paranasal sinus disease most notably as follows. Complete T2 hyperintense opacification of the right frontal sinus. Near complete T2 hyperintense opacification of the anterior right ethmoid air cells. Moderate mucosal thickening and fluid level within the right maxillary sinus. Mild-to-moderate left maxillary sinus mucosal thickening. MRA HEAD FINDINGS The intracranial internal carotid arteries are patent. The M1 middle cerebral arteries are patent. No M2 proximal branch occlusion or high-grade proximal stenosis is identified. The anterior cerebral arteries are patent. Dominant intracranial left vertebral artery. Severe stenosis of the mid to distal V4 right vertebral artery, new from the prior examination of 04/04/2007. The dominant intracranial left vertebral artery is patent without significant stenosis. The basilar artery is patent. The posterior cerebral arteries are patent. Fetal origin right posterior cerebral artery. Severe stenosis within the P4 left posterior cerebral artery, new from the prior exam (series 306, image 20). A left  posterior communicating artery is present. IMPRESSION: MRI brain: 1. Intermittently motion degraded examination, as described and limiting evaluation. 2. No evidence of acute infarction. 3. 0.6 x 2.0 cm extra-axial focus of signal abnormality with restricted diffusion overlying the right occipital lobe. This may reflect an extra-axial mass such as meningioma, but is incompletely characterized on this non-contrast exam. Postcontrast MR imaging of the brain is recommended for further evaluation and to exclude alternative etiologies (such as an extra-axial collection). 4. Small chronic left occipital lobe cortical infarct, new from the brain MRI of  04/04/2007. 5. Stable mild cerebral white matter chronic small vessel ischemic disease. 6. Redemonstrated small chronic infarcts within the right greater than left cerebellar hemispheres. 7. Progressive moderate to moderately advanced cerebral atrophy. Comparatively mild cerebellar atrophy. 8. Significant paranasal sinus disease, as described. MRA head: 1. No intracranial large vessel occlusion. 2. Severe stenosis of the non-dominant V4 right vertebral artery, new from the prior MRA head of 04/04/2007. 3. A severe stenosis within the P4 left posterior cerebral artery is also new from this prior exam. Electronically Signed   By: Kellie Simmering DO   On: 06/08/2020 15:07   MR BRAIN WO CONTRAST  Result Date: 06/08/2020 CLINICAL DATA:  Transient ischemic attack (TIA); incontinence, generalized weakness, gait disturbance. Neuro deficit, acute, stroke suspected. EXAM: MRI HEAD WITHOUT CONTRAST MRA HEAD WITHOUT CONTRAST TECHNIQUE: Multiplanar, multiecho pulse sequences of the brain and surrounding structures were obtained without intravenous contrast. Angiographic images of the head were obtained using MRA technique without contrast. COMPARISON:  Noncontrast head CT performed earlier today 06/08/2020, MRI/MRA head 04/04/2007. FINDINGS: MRI HEAD FINDINGS Brain: Intermittently motion degraded examination. Most notably, there is moderate/severe motion degradation of the axial T2 weighted sequence. Moderate to moderately advanced cerebral atrophy. Commensurate prominence of the ventricles and sulci. Comparatively mild cerebellar atrophy. 0.6 x 2.0 cm (AP x TV) extra-axial focus of signal abnormality overlying the right parietal lobe. This focus is intermediate in signal on the T2/FLAIR sequence and also demonstrates restricted diffusion (for instance as seen on series 9, image 19) (series 4, image 37). In retrospect, this finding is also appreciable on the head CT performed earlier today, with intermediate density on this  prior exam. This finding is new as compared to the brain MRI of 04/04/2007. No mass effect upon the underlying right parietal lobe. Small cortically based left occipital lobe infarct (PCA vascular territory), new from the prior brain MRI. Stable mild multifocal T2/FLAIR hyperintensity within the bilateral cerebral white matter which is nonspecific, but compatible with chronic small vessel ischemic disease. Redemonstration of multiple small chronic infarcts within the right greater than left cerebellar hemispheres. There is no acute infarct. No midline shift. Vascular: Expected proximal arterial flow voids. Skull and upper cervical spine: No appreciable focal marrow lesion. Sinuses/Orbits: Visualized orbits show no acute finding. Paranasal sinus disease most notably as follows. Complete T2 hyperintense opacification of the right frontal sinus. Near complete T2 hyperintense opacification of the anterior right ethmoid air cells. Moderate mucosal thickening and fluid level within the right maxillary sinus. Mild-to-moderate left maxillary sinus mucosal thickening. MRA HEAD FINDINGS The intracranial internal carotid arteries are patent. The M1 middle cerebral arteries are patent. No M2 proximal branch occlusion or high-grade proximal stenosis is identified. The anterior cerebral arteries are patent. Dominant intracranial left vertebral artery. Severe stenosis of the mid to distal V4 right vertebral artery, new from the prior examination of 04/04/2007. The dominant intracranial left vertebral artery is patent without significant stenosis. The basilar artery is  patent. The posterior cerebral arteries are patent. Fetal origin right posterior cerebral artery. Severe stenosis within the P4 left posterior cerebral artery, new from the prior exam (series 306, image 20). A left posterior communicating artery is present. IMPRESSION: MRI brain: 1. Intermittently motion degraded examination, as described and limiting evaluation. 2.  No evidence of acute infarction. 3. 0.6 x 2.0 cm extra-axial focus of signal abnormality with restricted diffusion overlying the right occipital lobe. This may reflect an extra-axial mass such as meningioma, but is incompletely characterized on this non-contrast exam. Postcontrast MR imaging of the brain is recommended for further evaluation and to exclude alternative etiologies (such as an extra-axial collection). 4. Small chronic left occipital lobe cortical infarct, new from the brain MRI of 04/04/2007. 5. Stable mild cerebral white matter chronic small vessel ischemic disease. 6. Redemonstrated small chronic infarcts within the right greater than left cerebellar hemispheres. 7. Progressive moderate to moderately advanced cerebral atrophy. Comparatively mild cerebellar atrophy. 8. Significant paranasal sinus disease, as described. MRA head: 1. No intracranial large vessel occlusion. 2. Severe stenosis of the non-dominant V4 right vertebral artery, new from the prior MRA head of 04/04/2007. 3. A severe stenosis within the P4 left posterior cerebral artery is also new from this prior exam. Electronically Signed   By: Kellie Simmering DO   On: 06/08/2020 15:07   MR BRAIN W CONTRAST  Result Date: 06/08/2020 CLINICAL DATA:  Stroke follow-up EXAM: MRI HEAD WITH CONTRAST TECHNIQUE: Multiplanar, multiecho pulse sequences of the brain and surrounding structures were obtained with intravenous contrast. CONTRAST:  5.9mL GADAVIST GADOBUTROL 1 MMOL/ML IV SOLN COMPARISON:  06/08/2020 FINDINGS: Diffusely enhancing 17 x 6 mm meningioma overlying the right occipital lobe. No mass effect on the underlying brain. Otherwise, no contrast-enhancing lesion. Otherwise unchanged findings compared to the noncontrast examination earlier today. IMPRESSION: Confirmation of meningioma overlying the right occipital lobe. Electronically Signed   By: Ulyses Jarred M.D.   On: 06/08/2020 21:52   ECHOCARDIOGRAM COMPLETE  Result Date:  06/08/2020    ECHOCARDIOGRAM REPORT   Patient Name:   JAYQUAN BRADSHER Date of Exam: 06/08/2020 Medical Rec #:  [id number]    Height:       61.0 in Accession #:    [id number]   Weight:       123.0 lb Date of Birth:  [birth year]    BSA:          1.536 m Patient Age:    63 years     BP:           140/84 mmHg Patient Gender: M            HR:           98 bpm. Exam Location:  Inpatient Procedure: 2D Echo Indications:    TIA  History:        Patient has prior history of Echocardiogram examinations, most                 recent 01/28/2008. Risk Factors:Hypertension, Dyslipidemia and                 Diabetes.  Sonographer:    Johny Chess Referring Phys: [place]  1. Left ventricular ejection fraction, by estimation, is 45 to 50%. The left ventricle has mildly decreased function. The left ventricle demonstrates regional wall motion abnormalities (see scoring diagram/findings for description). Left ventricular diastolic parameters are indeterminate. There is hypokinesis of the left ventricular, entire inferoseptal wall, inferior wall and inferolateral wall.  2. Right ventricular systolic function is normal. The right ventricular size is normal. There is mildly elevated pulmonary artery systolic pressure.  3. Left atrial size was mildly dilated.  4. The mitral valve is grossly normal. Trivial mitral valve regurgitation.  5. The aortic valve is tricuspid. There is moderate calcification of the aortic valve. There is moderate thickening of the aortic valve. Aortic valve regurgitation is mild to moderate.  6. The inferior vena cava is normal in size with greater than 50% respiratory variability, suggesting right atrial pressure of 3 mmHg. Comparison(s): No significant change from prior study. Conclusion(s)/Recommendation(s): No intracardiac source of embolism detected on this transthoracic study. A transesophageal echocardiogram is recommended to exclude cardiac source of embolism if clinically indicated.  FINDINGS  Left Ventricle: Left ventricular ejection fraction, by estimation, is 45 to 50%. The left ventricle has mildly decreased function. The left ventricle demonstrates regional wall motion abnormalities. The left ventricular internal cavity size was normal in size. There is no left ventricular hypertrophy. Left ventricular diastolic parameters are indeterminate. Right Ventricle: The right ventricular size is normal. No increase in right ventricular wall thickness. Right ventricular systolic function is normal. There is mildly elevated pulmonary artery systolic pressure. The tricuspid regurgitant velocity is 2.99  m/s, and with an assumed right atrial pressure of 3 mmHg, the estimated right ventricular systolic pressure is 78.6 mmHg. Left Atrium: Left atrial size was mildly dilated. Right Atrium: Right atrial size was normal in size. Pericardium: There is no evidence of pericardial effusion. Mitral Valve: The mitral valve is grossly normal. There is mild thickening of the mitral valve leaflet(s). There is moderate calcification of the mitral valve leaflet(s). Mild to moderate mitral annular calcification. Trivial mitral valve regurgitation. Tricuspid Valve: The tricuspid valve is normal in structure. Tricuspid valve regurgitation is mild. Aortic Valve: The aortic valve is tricuspid. There is moderate calcification of the aortic valve. There is moderate thickening of the aortic valve. Aortic valve regurgitation is mild to moderate. Aortic regurgitation PHT measures 201 msec. Pulmonic Valve: The pulmonic valve was grossly normal. Pulmonic valve regurgitation is trivial. Aorta: The aortic root and ascending aorta are structurally normal, with no evidence of dilitation. Venous: The inferior vena cava is normal in size with greater than 50% respiratory variability, suggesting right atrial pressure of 3 mmHg. IAS/Shunts: The atrial septum is grossly normal.  LEFT VENTRICLE PLAX 2D LVIDd:         5.30 cm     Diastology  LVIDs:         4.10 cm     LV e' medial:  5.98 cm/s LV PW:         0.90 cm     LV e' lateral: 9.90 cm/s LV IVS:        0.90 cm LVOT diam:     1.90 cm LV SV:         46 LV SV Index:   30 LVOT Area:     2.84 cm  LV Volumes (MOD) LV vol d, MOD A2C: 78.4 ml LV vol d, MOD A4C: 72.9 ml LV vol s, MOD A2C: 37.3 ml LV vol s, MOD A4C: 43.5 ml LV SV MOD A2C:     41.1 ml LV SV MOD A4C:     72.9 ml LV SV MOD BP:      35.2 ml RIGHT VENTRICLE RV S prime:     12.60 cm/s TAPSE (M-mode): 1.4 cm LEFT ATRIUM  Index       RIGHT ATRIUM           Index LA diam:        4.20 cm 2.73 cm/m  RA Area:     10.60 cm LA Vol (A2C):   38.7 ml 25.19 ml/m RA Volume:   19.30 ml  12.56 ml/m LA Vol (A4C):   30.0 ml 19.53 ml/m LA Biplane Vol: 34.8 ml 22.65 ml/m  AORTIC VALVE LVOT Vmax:   86.70 cm/s LVOT Vmean:  60.000 cm/s LVOT VTI:    0.162 m AI PHT:      201 msec  AORTA Ao Root diam: 3.10 cm Ao Asc diam:  3.35 cm TRICUSPID VALVE TR Peak grad:   35.8 mmHg TR Vmax:        299.00 cm/s  SHUNTS Systemic VTI:  0.16 m Systemic Diam: 1.90 cm Buford Dresser MD Electronically signed by Buford Dresser MD Signature Date/Time: 06/08/2020/7:57:36 PM    Final    VAS US CAROTID (at [facility] only)  Result Date: 06/08/2020 Carotid Arterial Duplex Study Indications:      TIA. Risk Factors:     Hypertension, hyperlipidemia, Diabetes, coronary artery                   disease. Comparison Study: 09/06/19 previous Performing Technologist: Abram Sander RVS  Examination Guidelines: A complete evaluation includes B-mode imaging, spectral Doppler, color Doppler, and power Doppler as needed of all accessible portions of each vessel. Bilateral testing is considered an integral part of a complete examination. Limited examinations for reoccurring indications may be performed as noted.  Right Carotid Findings: +----------+--------+--------+--------+------------------+--------+           PSV cm/sEDV cm/sStenosisPlaque DescriptionComments  +----------+--------+--------+--------+------------------+--------+ CCA Prox  68      12              heterogenous               +----------+--------+--------+--------+------------------+--------+ CCA Distal58      10              heterogenous               +----------+--------+--------+--------+------------------+--------+ ICA Prox  232     51      40-59%  heterogenous               +----------+--------+--------+--------+------------------+--------+ ICA Mid   76      21                                         +----------+--------+--------+--------+------------------+--------+ ICA Distal71      21                                         +----------+--------+--------+--------+------------------+--------+ ECA       101                                                +----------+--------+--------+--------+------------------+--------+ +----------+--------+-------+--------+-------------------+           PSV cm/sEDV cmsDescribeArm Pressure (mmHG) +----------+--------+-------+--------+-------------------+ [id number]                                         +----------+--------+-------+--------+-------------------+ +---------+--------+--+--------+-+---------+  VertebralPSV cm/s35EDV cm/s7Antegrade +---------+--------+--+--------+-+---------+  Left Carotid Findings: +----------+--------+--------+--------+------------------+--------+           PSV cm/sEDV cm/sStenosisPlaque DescriptionComments +----------+--------+--------+--------+------------------+--------+ CCA Prox  75      10              heterogenous               +----------+--------+--------+--------+------------------+--------+ CCA Distal73      17              heterogenous               +----------+--------+--------+--------+------------------+--------+ ICA Prox  96      21      1-39%   heterogenous               +----------+--------+--------+--------+------------------+--------+  ICA Distal106     33                                         +----------+--------+--------+--------+------------------+--------+ ECA       89                                                 +----------+--------+--------+--------+------------------+--------+ +----------+--------+--------+--------+-------------------+           PSV cm/sEDV cm/sDescribeArm Pressure (mmHG) +----------+--------+--------+--------+-------------------+ [id number]                                          +----------+--------+--------+--------+-------------------+ +---------+--------+--+--------+--+---------+ VertebralPSV cm/s71EDV cm/s20Antegrade +---------+--------+--+--------+--+---------+   Summary: Right Carotid: Velocities in the right ICA are consistent with a 40-59%                stenosis. Left Carotid: Velocities in the left ICA are consistent with a 1-39% stenosis. Vertebrals: Bilateral vertebral arteries demonstrate antegrade flow. *See table(s) above for measurements and observations.     Preliminary      Scheduled Meds: .  stroke: mapping our early stages of recovery book   Does not apply Once  . aspirin EC  81 mg Oral Daily  . enoxaparin (LOVENOX) injection  40 mg Subcutaneous Daily  . insulin aspart  0-15 Units Subcutaneous TID WC  . insulin glargine  10 Units Subcutaneous QHS  . lactulose  20 g Oral BID  . rosuvastatin  20 mg Oral Daily  . [START ON 06/17/2020] thiamine injection  100 mg Intravenous Daily   Continuous Infusions: . sodium chloride 50 mL/hr at 06/08/20 1800  . thiamine injection 500 mg (06/09/20 0549)   Followed by  . [START ON 06/11/2020] thiamine injection       LOS: 0 days   Time spent: 40  Nita Sells, MD [facility] To contact the attending provider between 7A-7P or the covering provider during after hours 7P-7A, please log into the web site [email or web address] and access using universal [place] password for that web site. If you do not  have the password, please call the hospital operator.  06/09/2020, 10:56 AM

## 2020-06-09 NOTE — Progress Notes (Signed)
EEG completed, results pending. 

## 2020-06-09 NOTE — Progress Notes (Signed)
Brief Neuro Update:  Reviewed MRI Brain. The meningioma does not seem to be pressing on his R occipital lobe, no mass effect, no edema. I do not think that this meningioma is contributing to his symptoms. EEG consistent with encephalopathy with no epileptiform discharges. Encephalopathy is improving.  Recs: - Continue Thiamine PO 100mg  daily until results come back and are normal - serum Ammonia of 70 and likely contributing to encephalopathy. - Neurology inpatient team will signoff. Please feel free to contact us with questions and concerns.   [place] Pager Number [phone]

## 2020-06-09 NOTE — Progress Notes (Signed)
Initial Nutrition Assessment  DOCUMENTATION CODES:  Non-severe (moderate) malnutrition in context of chronic illness  INTERVENTION:  Add Glucerna Shake po TID, each supplement provides 220 kcal and 10 grams of protein.  Add 30 ml ProSource Plus po BID, each supplement provides 100 kcal and 15 grams of protein.   Add MVI with minerals daily.  NUTRITION DIAGNOSIS:  Moderate Malnutrition related to chronic illness (uncontrolled T2DM, HLD, HTN, CAD) as evidenced by moderate fat depletion,mild fat depletion,mild muscle depletion,moderate muscle depletion.  GOAL:  Patient will meet greater than or equal to 90% of their needs  MONITOR:  PO intake,Supplement acceptance,Labs,Weight trends  REASON FOR ASSESSMENT:  Consult Assessment of nutrition requirement/status  ASSESSMENT:  70 yo male with a PMH of HTN, T2DM, HLD, PVD with carotid stenosis, and CAD s/p CABG who presents with acute metabolic encephalopathy.  Spoke with pt, daughter, and wife at bedside. Family in good spirits. Pt reports eating well before admission. He had recently celebrated his daughter's engagement and going away party and he may have had too much sugar, which he thinks caused his sugar levels to go too high. He reports that he has not had a change in appetite. He mostly eats traditional cuisine at home, chicken, curry, naan.  He reports some weight loss recently. He reports that he has lost weight and been smaller since his knee surgery in 2020. Epic shows a steady decline in weight. He reports that he has difficulty wanting to get up and exercise, which wife and daughter attempt to encourage him to do more of. On exam, pt has some depletions.  Pt's daughter asked a lot of questions regarding diabetes and diet for diabetes. RD answered these questions and provided "Carbohydrate Counting for People with Diabetes" from [employer] handout. Pt and family appreciative of recommendations.  Given  depletions, recommend adding Glucerna TID and ProSource Plus BID to help with protein needs. Pt open to these supplements.  Relevant Medications: SSI, bedtime lantus, metformin, thiamine 500 mg in normal saline TID Labs: reviewed; CBG 119-177 HbA1c: 10.3% (05/2020)  NUTRITION - FOCUSED PHYSICAL EXAM: Flowsheet Row Most Recent Value  Orbital Region Mild depletion  Upper Arm Region Moderate depletion  Thoracic and Lumbar Region No depletion  Buccal Region Moderate depletion  Temple Region Moderate depletion  Clavicle Bone Region Moderate depletion  Clavicle and Acromion Bone Region Moderate depletion  Scapular Bone Region Moderate depletion  Dorsal Hand Mild depletion  Patellar Region Moderate depletion  Anterior Thigh Region Mild depletion  Posterior Calf Region Mild depletion  Edema (RD Assessment) None  Hair Reviewed  Eyes Reviewed  Mouth Reviewed  Skin Reviewed  Nails Reviewed     Diet Order:   Diet Order            Diet heart healthy/carb modified Room service appropriate? Yes; Fluid consistency: Thin  Diet effective now                EDUCATION NEEDS:  Education needs have been addressed  Skin:  Skin Assessment: Reviewed RN Assessment  Last BM:  06/07/20  Height:  Ht Readings from Last 1 Encounters:  06/08/20 5\' 1"  (1.549 m)   Weight:  Wt Readings from Last 1 Encounters:  06/08/20 55.8 kg   Ideal Body Weight:  51 kg  BMI:  Body mass index is 23.24 kg/m.  Estimated Nutritional Needs:  Kcal:  1650-1850 Protein:  80-95 grams Fluid:  >1.65 L  Derrel Nip, RD, LDN Registered Dietitian After Hours/Weekend  Pager # in [employer]

## 2020-06-09 NOTE — Procedures (Addendum)
  EEG Procedure: Date:  06/09/20 Duration: 24.45 Clinical indication: suspected encephalopathy; evaluate background; suspected seizures r/o epileptiform discharges Clinical state:  awake and briefly drowsy EEG Procedure:  This is a digitally recorded routine electroencephalogram.  The international 10-20 electrode placement system is used for scalp electrode placement.  Eighteen channels of scalp EEG are recorded.  The data is stored digitally and reviewed in reformatted montages for optimal display. EEG Description: This EEG is well organized.  There is a well formed, well sustained and symmetrical 6-7 Hz posterior dominant rhythm with eye opening and drowsiness.  The background consisted of theta, some delta, alpha and beta frequencies of medium voltage.  During drowsiness, background slowing appeared.  Stage 2 sleep was not recorded.  There were no focal abnormalities, persistent asymmetries or epileptiform discharges.  Activating procedures:  Photic stimulation did not alter the recording.  Hyperventilation was not performed. EKG:  The EKG rhythm strip demonstrated NSR at 96bpm.  EEG Classification:  Abnormal 1.  Background slowing, generalized  EEG Interpretation: This routine EEG recorded in the awake and drowsy states is abnormal.  The above findings suggest diffuse and/or multifocal cerebral dysfunction.  There were no epileptiform discharges.

## 2020-06-10 LAB — CBC WITH DIFFERENTIAL/PLATELET
Abs Immature Granulocytes: 0.08 10*3/uL — ABNORMAL HIGH (ref 0.00–0.07)
Basophils Absolute: 0.1 10*3/uL (ref 0.0–0.1)
Basophils Relative: 1 %
Eosinophils Absolute: 0.2 10*3/uL (ref 0.0–0.5)
Eosinophils Relative: 2 %
HCT: 32.5 % — ABNORMAL LOW (ref 39.0–52.0)
Hemoglobin: 10.3 g/dL — ABNORMAL LOW (ref 13.0–17.0)
Immature Granulocytes: 1 %
Lymphocytes Relative: 20 %
Lymphs Abs: 1.4 10*3/uL (ref 0.7–4.0)
MCH: 20.4 pg — ABNORMAL LOW (ref 26.0–34.0)
MCHC: 31.7 g/dL (ref 30.0–36.0)
MCV: 64.5 fL — ABNORMAL LOW (ref 80.0–100.0)
Monocytes Absolute: 1 10*3/uL (ref 0.1–1.0)
Monocytes Relative: 14 %
Neutro Abs: 4.4 10*3/uL (ref 1.7–7.7)
Neutrophils Relative %: 62 %
Platelets: 393 10*3/uL (ref 150–400)
RBC: 5.04 MIL/uL (ref 4.22–5.81)
RDW: 18 % — ABNORMAL HIGH (ref 11.5–15.5)
WBC: 7 10*3/uL (ref 4.0–10.5)
nRBC: 0 % (ref 0.0–0.2)

## 2020-06-10 LAB — COMPREHENSIVE METABOLIC PANEL
ALT: 11 U/L (ref 0–44)
AST: 13 U/L — ABNORMAL LOW (ref 15–41)
Albumin: 2.7 g/dL — ABNORMAL LOW (ref 3.5–5.0)
Alkaline Phosphatase: 56 U/L (ref 38–126)
Anion gap: 8 (ref 5–15)
BUN: 13 mg/dL (ref 8–23)
CO2: 21 mmol/L — ABNORMAL LOW (ref 22–32)
Calcium: 8.7 mg/dL — ABNORMAL LOW (ref 8.9–10.3)
Chloride: 107 mmol/L (ref 98–111)
Creatinine, Ser: 1 mg/dL (ref 0.61–1.24)
GFR, Estimated: >60 mL/min (ref >60)
Glucose, Bld: 149 mg/dL — ABNORMAL HIGH (ref 70–99)
Potassium: 4 mmol/L (ref 3.5–5.1)
Sodium: 136 mmol/L (ref 135–145)
Total Bilirubin: 0.8 mg/dL (ref 0.3–1.2)
Total Protein: 5.7 g/dL — ABNORMAL LOW (ref 6.5–8.1)

## 2020-06-10 LAB — PROTIME-INR
INR: 1.1 (ref 0.8–1.2)
Prothrombin Time: 13.4 seconds (ref 11.4–15.2)

## 2020-06-10 LAB — GLUCOSE, CAPILLARY
Glucose-Capillary: 155 mg/dL — ABNORMAL HIGH (ref 70–99)
Glucose-Capillary: 168 mg/dL — ABNORMAL HIGH (ref 70–99)

## 2020-06-10 MED ORDER — LACTULOSE 10 GM/15ML PO SOLN: 20.0000 g | Freq: Two times a day (BID) | ORAL | 0 refills | Status: DC

## 2020-06-10 NOTE — Evaluation (Signed)
Physical Therapy Evaluation Patient Details Name: William Haney MRN: [id number] DOB: [birth year] Today's Date: 06/10/2020   History of Present Illness  70 y.o. male presenting with worsening AMS x several days. Patient admitted with acute metabolic encephalopathy with possible concern for early onset dementia.  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 Further work-up pending. PMHx significant for HTN, HLD, DM, COVID-19 03/2020, L TKA 12/2018, PVD with carotid stenosis, and CAD s/p CABG.  Clinical Impression  Late entry from date of service (06/09/2020). PTA, patient lives with wife and reports independence with mobility. Patient overall supervision for mobility with no AD and able to safely negotiate stairs to access home. Patient demonstrates mild balance deficits due to R knee pain, patient reports awaiting R TKA. Patient will benefit from skilled PT services during acute stay to address listed deficits (see PT problem list). No PT follow up recommended at this time.      Follow Up Recommendations No PT follow up    Equipment Recommendations  None recommended by PT    Recommendations for Other Services       Precautions / Restrictions Precautions Precautions: Fall Restrictions Weight Bearing Restrictions: No      Mobility  Bed Mobility               General bed mobility comments: up in recliner on arrival    Transfers Overall transfer level: Needs assistance Equipment used: None Transfers: Sit to/from Stand Sit to Stand: Supervision         General transfer comment: Supervision A for safety.  Ambulation/Gait Ambulation/Gait assistance: Supervision Gait Distance (Feet): 200 Feet Assistive device: None Gait Pattern/deviations: Decreased stride length;Decreased stance time - right Gait velocity: decreased   General Gait Details: Patient with decreased stance time on R. Patient reports awaiting R  TKA  Stairs   Stairs assistance: Supervision Stair Management: Two rails;Alternating pattern;Forwards Number of Stairs: 2    Wheelchair Mobility    Modified Rankin (Stroke Patients Only)       Balance Overall balance assessment: Mild deficits observed, not formally tested                                           Pertinent Vitals/Pain Pain Assessment: No/denies pain    Home Living Family/patient expects to be discharged to:: Private residence Living Arrangements: Spouse/significant other Available Help at Discharge: Family;Available 24 hours/day Type of Home: House Home Access: Stairs to enter Entrance Stairs-Rails: None Entrance Stairs-Number of Steps: 2 Home Layout: One level Home Equipment: Shower seat - built in;Cane - single point;[name] - 2 wheels;Wheelchair - manual      Prior Function Level of Independence: Independent         Comments: 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     Hand Dominance        Extremity/Trunk Assessment   Upper Extremity Assessment Upper Extremity Assessment: Defer to OT evaluation    Lower Extremity Assessment Lower Extremity Assessment: Overall WFL for tasks assessed    Cervical / Trunk Assessment Cervical / Trunk Assessment: Normal  Communication   Communication: No difficulties  Cognition Arousal/Alertness: Awake/alert Behavior During Therapy: WFL for tasks assessed/performed Overall Cognitive Status: No family/caregiver present to determine baseline cognitive functioning Area of Impairment: Memory  Memory: Decreased short-term memory         General Comments: Patient A&Ox4. Noted STM deficits.      General Comments      Exercises     Assessment/Plan    PT Assessment Patient needs continued PT services  PT Problem List Decreased strength;Decreased activity tolerance;Decreased  balance;Decreased mobility       PT Treatment Interventions DME instruction;Gait training;Stair training;Functional mobility training;Therapeutic activities;Therapeutic exercise;Balance training;Patient/family education    PT Goals (Current goals can be found in the Care Plan section)  Acute Rehab PT Goals Patient Stated Goal: To return home. PT Goal Formulation: With patient Time For Goal Achievement: 06/23/20 Potential to Achieve Goals: Good    Frequency Min 4X/week   Barriers to discharge        Co-evaluation               AM-PAC PT "6 Clicks" Mobility  Outcome Measure Help needed turning from your back to your side while in a flat bed without using bedrails?: A Little Help needed moving from lying on your back to sitting on the side of a flat bed without using bedrails?: A Little Help needed moving to and from a bed to a chair (including a wheelchair)?: A Little Help needed standing up from a chair using your arms (e.g., wheelchair or bedside chair)?: A Little Help needed to walk in hospital room?: A Little Help needed climbing 3-5 steps with a railing? : A Little 6 Click Score: 18    End of Session Equipment Utilized During Treatment: Gait belt Activity Tolerance: Patient tolerated treatment well Patient left: in chair;with call bell/phone within reach;with chair alarm set;with nursing/sitter in room Nurse Communication: Mobility status PT Visit Diagnosis: Unsteadiness on feet (R26.81);Muscle weakness (generalized) (M62.81)    Time:  -      Charges:              William Haney PT, DPT Acute Rehabilitation Services Pager [phone] Office [phone]   William Haney 06/10/2020, 12:45 PM

## 2020-06-10 NOTE — Discharge Summary (Signed)
Physician Discharge Summary  William Haney KXF:[id number] DOB: [birth year] DOA: 06/08/2020  PCP: Unk Pinto, MD  Admit date: 06/08/2020 Discharge date: 06/10/2020  Time spent: 24 minutes  Recommendations for Outpatient Follow-up:  1. Recommend outpatient evaluation by Dr. Collene Mares for hyperammonemia-at this time no emergent requirement work-up but may need repeat labs etc. in the outpatient setting 2. Recommend yearly/biannual monitoring of meningioma found on this admission 3. Recommend Chem-12 CBC in 1 week 4. TOC visit Dr. Percival Spanish in several months as per his recommendations 5. Follow thiamine level consider methylmalonic acid in the outpatient setting for work-up of metabolic encephalopathy 6. New medication this admission-lactulose  Discharge Diagnoses:  MAIN problem for hospitalization   toxic metabolic encephalopathy probably from uncontrolled hyperglycemia in addition to hyperammonemia without cause  Please see below for itemized issues addressed in HOpsital- refer to other progress notes for clarity if needed  Discharge Condition: Improved  Diet recommendation: Diabetic  Filed Weights   06/08/20 0613  Weight: 55.8 kg    History of present illness:  70 E. Asian male home dwelling Sustained COVID 03/2020 CABG 2008 HTN HLD DM TY 2 History of bilateral mild carotid stenosis based on 2018 carotid ultrasounds TKR 12/18/2018 Last admitted 01/29/2017 for nonspecific chest pain  Recent Rx Dr. Vicente Serene office visit 05/25/2020 for bronchitis-Decadron azithromycin Tessalon Phenergan given  Present 3/31 with confusion-patient urinated on floor found in bed naked CBG 441 Paucity of verbal output Neurology consulted as concerns for stroke Meningioma found on MRI brain  Contrast MRI requested EEG requested  ammonia elevated at [facility] Course:  Toxic metabolic encephalopathy secondary to hyperammonemia and hyperglycemia at night with polyuria-no 1 specific cause for  encephalopathy Meningioma noted over right occipital lobe Abnormal EEG but no epilepsy-patient completely nonfocal and coherent at this time neurology opinion regarding meningioma is this does not contribute to his symptoms and will need to be outpatient monitored I will discontinue Flexeril, cinnamon and Hycodan syrup on discharge Hyperammonemia with normal LFT pattern Etiology is unclear- ultrasound liver shows potential kidney pole mass but this is probably incidental as he has no symptoms On discharge continuing  lactulose 20 twice daily and we will see how the patient does in terms of mental status and changes Tells me he has never had hepatitis and has never had blood transfusion Hold on autoimmune work-up until sees Dr. Collene Mares in the outpatient setting Covid recovered 03/30/2020 Recent bronchitis 05/25/2020 Will need dedicated chest x-ray in the outpatient setting versus referral to pulmonary if this becomes a recurrent issue CABG 2008, mild bilateral carotid stenosis 2018 ACS? All meds resumed on discharge His personal cardiologist Dr. Percival Spanish saw the patient and will follow up in the outpatient LvEF is depressed compared to prior and this is probably artifactual? DM TY 2 with complication of hyperglycemia          all meds resumed on discharge blood sugars much better controlled  Procedures:  EEG  MRI  Echo  Ultrasound abdomen  Consultations:  Cardiology  Neurology  Discharge Exam: Vitals:   06/10/20 0352 06/10/20 0726  BP: (!) 149/81 129/79  Pulse: 91 99  Resp: 18 18  Temp: 98.3 F (36.8 C) 97.8 F (36.6 C)  SpO2: 100% 100%    Subj on day of d/c   Awake alert coherent completely oriented eating drinking  General Exam on discharge  EOMI NCAT no focal deficit moving all 4 limbs equally without deficit Not confused nonfocal S1-S2 no murmur Abdomen soft no rebound no  HSM No lower extremity edema   I had a long discussion with the patient's daughters on  the telephone-one of them is a physical therapist-I will CC as I mentioned in my note Dr. Collene Mares to see if she would like to follow-up this patient in the outpatient setting  Discharge Instructions   Discharge Instructions    Diet - low sodium heart healthy   Complete by: As directed    Discharge instructions   Complete by: As directed    Please take the lactulose regularly as we have recommended Please have a close follow-up with your primary md for a referral to Dr. Woodroe Chen will also forward your note to Dr. Collene Mares to see if she can work you in to be seen for possible liver pathologies and you may need testing as recommended by her please once again do not take any over-the-counter supplements until you see your specialist and Dr. Collene Mares And I have discontinued your Flexeril and Phenergan until you are reviewed-I would recommend you do not take Tylenol for the time being   Increase activity slowly   Complete by: As directed      Allergies as of 06/10/2020      Reactions   Ace Inhibitors Cough   Statins Other (See Comments)   Elevated LFT's      Medication List    STOP taking these medications   Cinnamon 500 MG capsule   cyclobenzaprine 10 MG tablet Commonly known as: FLEXERIL   promethazine-codeine 6.25-10 MG/5ML syrup Commonly known as: PHENERGAN with CODEINE     TAKE these medications   aspirin EC 81 MG tablet Take 81 mg by mouth daily. Swallow whole.   blood glucose meter kit and supplies Test sugars once to twice daily   empagliflozin 25 MG Tabs tablet Commonly known as: Jardiance Take 1 tablet (25 mg total) by mouth daily.   glimepiride 4 MG tablet Commonly known as: AMARYL Take  1 tablet  2 x /day  with Meals  for Diabetes What changed:   how much to take  how to take this  when to take this  additional instructions   lactulose 10 GM/15ML solution Commonly known as: CHRONULAC Take 30 mLs (20 g total) by mouth 2 (two) times daily.   metFORMIN 500 MG  tablet Commonly known as: GLUCOPHAGE TAKE 2 TABLETS TWICE A DAY WITH MEALS FOR DIABETES What changed:   how much to take  how to take this  when to take this  additional instructions   metoprolol tartrate 25 MG tablet Commonly known as: LOPRESSOR Take      1 tablet       2 x /day (every 12 hours)        for BP What changed:   how much to take  how to take this  when to take this  additional instructions   multivitamin tablet Take 1 tablet by mouth daily.   rosuvastatin 20 MG tablet Commonly known as: CRESTOR TAKE 1 TABLET BY MOUTH EVERY DAY FOR CHOLESTEROL What changed:   how much to take  how to take this  when to take this  additional instructions   telmisartan 20 MG tablet Commonly known as: MICARDIS TAKE 1 TABLET DAILY FOR BP & KIDNEY PROTECTION What changed: See the new instructions.   Vitamin D3 125 MCG (5000 UT) Caps Take 5,000 Units by mouth daily.      Allergies  Allergen Reactions  . Ace Inhibitors Cough  . Statins Other (See  Comments)    Elevated LFT's    Follow-up Information    Schedule an appointment as soon as possible for a visit  with Unk Pinto, MD.   Specialty: Internal Medicine Contact information: [address] [place] [address] [phone]        [facility].   Specialty: Emergency Medicine Why: If symptoms worsen Contact information: [address] [id number] [place]       Juanita Craver, MD. Call in 1 week(s).   Specialty: Gastroenterology Contact information: [address], [place] [address] [phone]                The results of significant diagnostics from this hospitalization (including imaging, microbiology, ancillary and laboratory) are listed below for reference.    Significant Diagnostic Studies: DG Chest 2 View  Result Date: 06/08/2020 CLINICAL DATA:   70 year old male with altered mental status.  Cough. EXAM: CHEST - 2 VIEW COMPARISON:  Chest radiographs 06/05/2020 and earlier. FINDINGS: Upright PA and lateral views. Sequelae of CABG. Normal cardiac size and mediastinal contours. Stable lung volumes and ventilation with no pneumothorax, pulmonary edema, pleural effusion or confluent pulmonary opacity. Stable visualized osseous structures. Healing right lateral rib fractures again noted. Negative visible bowel gas pattern. IMPRESSION: No acute cardiopulmonary abnormality. Healing right rib fractures again noted. Electronically Signed   By: Genevie Ann M.D.   On: 06/08/2020 07:58   DG Chest 2 View  Result Date: 06/06/2020 CLINICAL DATA:  Productive cough EXAM: CHEST - 2 VIEW COMPARISON:  04/17/2020 FINDINGS: Cardiac shadow is within normal limits. Postsurgical changes are seen. Aortic calcifications are noted. The lungs are clear. Healing fractures with callus are noted on the right involving the fourth through seventh ribs. IMPRESSION: No acute abnormality noted. Multiple healing rib fractures on the right. Electronically Signed   By: Inez Catalina M.D.   On: 06/06/2020 00:06   CT Head Wo Contrast  Result Date: 06/08/2020 CLINICAL DATA:  Altered mental status. EXAM: CT HEAD WITHOUT CONTRAST TECHNIQUE: Contiguous axial images were obtained from the base of the skull through the vertex without intravenous contrast. COMPARISON:  April 03, 2007. FINDINGS: Brain: Mild diffuse cortical atrophy is noted. Mild chronic ischemic white matter disease is noted. Left occipital low density is noted concerning for infarction of indeterminate age. No midline shift is noted. Ventricular size is within normal limits. No hemorrhage is noted. Vascular: No hyperdense vessel or unexpected calcification. Skull: Normal. Negative for fracture or focal lesion. Sinuses/Orbits: Bilateral maxillary sinusitis is noted. Other: None. IMPRESSION: Left occipital low density is noted  concerning for infarction of indeterminate age. MRI is recommended for further evaluation. Electronically Signed   By: Marijo Conception M.D.   On: 06/08/2020 10:02   MR ANGIO HEAD WO CONTRAST  Result Date: 06/08/2020 CLINICAL DATA:  Transient ischemic attack (TIA); incontinence, generalized weakness, gait disturbance. Neuro deficit, acute, stroke suspected. EXAM: MRI HEAD WITHOUT CONTRAST MRA HEAD WITHOUT CONTRAST TECHNIQUE: Multiplanar, multiecho pulse sequences of the brain and surrounding structures were obtained without intravenous contrast. Angiographic images of the head were obtained using MRA technique without contrast. COMPARISON:  Noncontrast head CT performed earlier today 06/08/2020, MRI/MRA head 04/04/2007. FINDINGS: MRI HEAD FINDINGS Brain: Intermittently motion degraded examination. Most notably, there is moderate/severe motion degradation of the axial T2 weighted sequence. Moderate to moderately advanced cerebral atrophy. Commensurate prominence of the ventricles and sulci. Comparatively mild cerebellar atrophy. 0.6 x 2.0 cm (  AP x TV) extra-axial focus of signal abnormality overlying the right parietal lobe. This focus is intermediate in signal on the T2/FLAIR sequence and also demonstrates restricted diffusion (for instance as seen on series 9, image 19) (series 4, image 37). In retrospect, this finding is also appreciable on the head CT performed earlier today, with intermediate density on this prior exam. This finding is new as compared to the brain MRI of 04/04/2007. No mass effect upon the underlying right parietal lobe. Small cortically based left occipital lobe infarct (PCA vascular territory), new from the prior brain MRI. Stable mild multifocal T2/FLAIR hyperintensity within the bilateral cerebral white matter which is nonspecific, but compatible with chronic small vessel ischemic disease. Redemonstration of multiple small chronic infarcts within the right greater than left cerebellar  hemispheres. There is no acute infarct. No midline shift. Vascular: Expected proximal arterial flow voids. Skull and upper cervical spine: No appreciable focal marrow lesion. Sinuses/Orbits: Visualized orbits show no acute finding. Paranasal sinus disease most notably as follows. Complete T2 hyperintense opacification of the right frontal sinus. Near complete T2 hyperintense opacification of the anterior right ethmoid air cells. Moderate mucosal thickening and fluid level within the right maxillary sinus. Mild-to-moderate left maxillary sinus mucosal thickening. MRA HEAD FINDINGS The intracranial internal carotid arteries are patent. The M1 middle cerebral arteries are patent. No M2 proximal branch occlusion or high-grade proximal stenosis is identified. The anterior cerebral arteries are patent. Dominant intracranial left vertebral artery. Severe stenosis of the mid to distal V4 right vertebral artery, new from the prior examination of 04/04/2007. The dominant intracranial left vertebral artery is patent without significant stenosis. The basilar artery is patent. The posterior cerebral arteries are patent. Fetal origin right posterior cerebral artery. Severe stenosis within the P4 left posterior cerebral artery, new from the prior exam (series 306, image 20). A left posterior communicating artery is present. IMPRESSION: MRI brain: 1. Intermittently motion degraded examination, as described and limiting evaluation. 2. No evidence of acute infarction. 3. 0.6 x 2.0 cm extra-axial focus of signal abnormality with restricted diffusion overlying the right occipital lobe. This may reflect an extra-axial mass such as meningioma, but is incompletely characterized on this non-contrast exam. Postcontrast MR imaging of the brain is recommended for further evaluation and to exclude alternative etiologies (such as an extra-axial collection). 4. Small chronic left occipital lobe cortical infarct, new from the brain MRI of  04/04/2007. 5. Stable mild cerebral white matter chronic small vessel ischemic disease. 6. Redemonstrated small chronic infarcts within the right greater than left cerebellar hemispheres. 7. Progressive moderate to moderately advanced cerebral atrophy. Comparatively mild cerebellar atrophy. 8. Significant paranasal sinus disease, as described. MRA head: 1. No intracranial large vessel occlusion. 2. Severe stenosis of the non-dominant V4 right vertebral artery, new from the prior MRA head of 04/04/2007. 3. A severe stenosis within the P4 left posterior cerebral artery is also new from this prior exam. Electronically Signed   By: Kellie Simmering DO   On: 06/08/2020 15:07   MR BRAIN WO CONTRAST  Result Date: 06/08/2020 CLINICAL DATA:  Transient ischemic attack (TIA); incontinence, generalized weakness, gait disturbance. Neuro deficit, acute, stroke suspected. EXAM: MRI HEAD WITHOUT CONTRAST MRA HEAD WITHOUT CONTRAST TECHNIQUE: Multiplanar, multiecho pulse sequences of the brain and surrounding structures were obtained without intravenous contrast. Angiographic images of the head were obtained using MRA technique without contrast. COMPARISON:  Noncontrast head CT performed earlier today 06/08/2020, MRI/MRA head 04/04/2007. FINDINGS: MRI HEAD FINDINGS Brain: Intermittently motion degraded examination. Most  notably, there is moderate/severe motion degradation of the axial T2 weighted sequence. Moderate to moderately advanced cerebral atrophy. Commensurate prominence of the ventricles and sulci. Comparatively mild cerebellar atrophy. 0.6 x 2.0 cm (AP x TV) extra-axial focus of signal abnormality overlying the right parietal lobe. This focus is intermediate in signal on the T2/FLAIR sequence and also demonstrates restricted diffusion (for instance as seen on series 9, image 19) (series 4, image 37). In retrospect, this finding is also appreciable on the head CT performed earlier today, with intermediate density on this  prior exam. This finding is new as compared to the brain MRI of 04/04/2007. No mass effect upon the underlying right parietal lobe. Small cortically based left occipital lobe infarct (PCA vascular territory), new from the prior brain MRI. Stable mild multifocal T2/FLAIR hyperintensity within the bilateral cerebral white matter which is nonspecific, but compatible with chronic small vessel ischemic disease. Redemonstration of multiple small chronic infarcts within the right greater than left cerebellar hemispheres. There is no acute infarct. No midline shift. Vascular: Expected proximal arterial flow voids. Skull and upper cervical spine: No appreciable focal marrow lesion. Sinuses/Orbits: Visualized orbits show no acute finding. Paranasal sinus disease most notably as follows. Complete T2 hyperintense opacification of the right frontal sinus. Near complete T2 hyperintense opacification of the anterior right ethmoid air cells. Moderate mucosal thickening and fluid level within the right maxillary sinus. Mild-to-moderate left maxillary sinus mucosal thickening. MRA HEAD FINDINGS The intracranial internal carotid arteries are patent. The M1 middle cerebral arteries are patent. No M2 proximal branch occlusion or high-grade proximal stenosis is identified. The anterior cerebral arteries are patent. Dominant intracranial left vertebral artery. Severe stenosis of the mid to distal V4 right vertebral artery, new from the prior examination of 04/04/2007. The dominant intracranial left vertebral artery is patent without significant stenosis. The basilar artery is patent. The posterior cerebral arteries are patent. Fetal origin right posterior cerebral artery. Severe stenosis within the P4 left posterior cerebral artery, new from the prior exam (series 306, image 20). A left posterior communicating artery is present. IMPRESSION: MRI brain: 1. Intermittently motion degraded examination, as described and limiting evaluation. 2.  No evidence of acute infarction. 3. 0.6 x 2.0 cm extra-axial focus of signal abnormality with restricted diffusion overlying the right occipital lobe. This may reflect an extra-axial mass such as meningioma, but is incompletely characterized on this non-contrast exam. Postcontrast MR imaging of the brain is recommended for further evaluation and to exclude alternative etiologies (such as an extra-axial collection). 4. Small chronic left occipital lobe cortical infarct, new from the brain MRI of 04/04/2007. 5. Stable mild cerebral white matter chronic small vessel ischemic disease. 6. Redemonstrated small chronic infarcts within the right greater than left cerebellar hemispheres. 7. Progressive moderate to moderately advanced cerebral atrophy. Comparatively mild cerebellar atrophy. 8. Significant paranasal sinus disease, as described. MRA head: 1. No intracranial large vessel occlusion. 2. Severe stenosis of the non-dominant V4 right vertebral artery, new from the prior MRA head of 04/04/2007. 3. A severe stenosis within the P4 left posterior cerebral artery is also new from this prior exam. Electronically Signed   By: Kellie Simmering DO   On: 06/08/2020 15:07   MR BRAIN W CONTRAST  Result Date: 06/08/2020 CLINICAL DATA:  Stroke follow-up EXAM: MRI HEAD WITH CONTRAST TECHNIQUE: Multiplanar, multiecho pulse sequences of the brain and surrounding structures were obtained with intravenous contrast. CONTRAST:  5.67m GADAVIST GADOBUTROL 1 MMOL/ML IV SOLN COMPARISON:  06/08/2020 FINDINGS: Diffusely enhancing 17 x  6 mm meningioma overlying the right occipital lobe. No mass effect on the underlying brain. Otherwise, no contrast-enhancing lesion. Otherwise unchanged findings compared to the noncontrast examination earlier today. IMPRESSION: Confirmation of meningioma overlying the right occipital lobe. Electronically Signed   By: Ulyses Jarred M.D.   On: 06/08/2020 21:52   EEG adult  Result Date: 06/09/2020 Mickie Hillier, MD     06/09/2020  1:04 PM EEG Procedure: Date:  06/09/20 Duration: 24.45 Clinical indication: suspected encephalopathy; evaluate background; suspected seizures r/o epileptiform discharges Clinical state:  awake and briefly drowsy EEG Procedure: This is a digitally recorded routine electroencephalogram.  The international 10-20 electrode placement system is used for scalp electrode placement.  Eighteen channels of scalp EEG are recorded.  The data is stored digitally and reviewed in reformatted montages for optimal display. EEG Description: This EEG is well organized.  There is a well formed, well sustained and symmetrical 6-7 Hz posterior dominant rhythm with eye opening and drowsiness.  The background consisted of theta, some delta, alpha and beta frequencies of medium voltage.  During drowsiness, background slowing appeared.  Stage 2 sleep was not recorded.  There were no focal abnormalities, persistent asymmetries or epileptiform discharges. Activating procedures:  Photic stimulation did not alter the recording.  Hyperventilation was not performed. EKG:  The EKG rhythm strip demonstrated NSR at 96bpm. EEG Classification:  Abnormal 1.  Background slowing, generalized EEG Interpretation: This routine EEG recorded in the awake and drowsy states is abnormal.  The above findings suggest diffuse and/or multifocal cerebral dysfunction.  There were no epileptiform discharges.   ECHOCARDIOGRAM COMPLETE  Result Date: 06/08/2020    ECHOCARDIOGRAM REPORT   Patient Name:   William Haney Date of Exam: 06/08/2020 Medical Rec #:  [id number]    Height:       61.0 in Accession #:    [id number]   Weight:       123.0 lb Date of Birth:  05-04-1950    BSA:          1.536 m Patient Age:    27 years     BP:           140/84 mmHg Patient Gender: M            HR:           98 bpm. Exam Location:  Inpatient Procedure: 2D Echo Indications:    TIA  History:        Patient has prior history of Echocardiogram examinations, most                  recent 01/28/2008. Risk Factors:Hypertension, Dyslipidemia and                 Diabetes.  Sonographer:    Johny Chess Referring Phys: [place]  1. Left ventricular ejection fraction, by estimation, is 45 to 50%. The left ventricle has mildly decreased function. The left ventricle demonstrates regional wall motion abnormalities (see scoring diagram/findings for description). Left ventricular diastolic parameters are indeterminate. There is hypokinesis of the left ventricular, entire inferoseptal wall, inferior wall and inferolateral wall.  2. Right ventricular systolic function is normal. The right ventricular size is normal. There is mildly elevated pulmonary artery systolic pressure.  3. Left atrial size was mildly dilated.  4. The mitral valve is grossly normal. Trivial mitral valve regurgitation.  5. The aortic valve is tricuspid. There is moderate calcification of the aortic valve. There is moderate thickening of the aortic  valve. Aortic valve regurgitation is mild to moderate.  6. The inferior vena cava is normal in size with greater than 50% respiratory variability, suggesting right atrial pressure of 3 mmHg. Comparison(s): No significant change from prior study. Conclusion(s)/Recommendation(s): No intracardiac source of embolism detected on this transthoracic study. A transesophageal echocardiogram is recommended to exclude cardiac source of embolism if clinically indicated. FINDINGS  Left Ventricle: Left ventricular ejection fraction, by estimation, is 45 to 50%. The left ventricle has mildly decreased function. The left ventricle demonstrates regional wall motion abnormalities. The left ventricular internal cavity size was normal in size. There is no left ventricular hypertrophy. Left ventricular diastolic parameters are indeterminate. Right Ventricle: The right ventricular size is normal. No increase in right ventricular wall thickness. Right ventricular systolic  function is normal. There is mildly elevated pulmonary artery systolic pressure. The tricuspid regurgitant velocity is 2.99  m/s, and with an assumed right atrial pressure of 3 mmHg, the estimated right ventricular systolic pressure is 27.5 mmHg. Left Atrium: Left atrial size was mildly dilated. Right Atrium: Right atrial size was normal in size. Pericardium: There is no evidence of pericardial effusion. Mitral Valve: The mitral valve is grossly normal. There is mild thickening of the mitral valve leaflet(s). There is moderate calcification of the mitral valve leaflet(s). Mild to moderate mitral annular calcification. Trivial mitral valve regurgitation. Tricuspid Valve: The tricuspid valve is normal in structure. Tricuspid valve regurgitation is mild. Aortic Valve: The aortic valve is tricuspid. There is moderate calcification of the aortic valve. There is moderate thickening of the aortic valve. Aortic valve regurgitation is mild to moderate. Aortic regurgitation PHT measures 201 msec. Pulmonic Valve: The pulmonic valve was grossly normal. Pulmonic valve regurgitation is trivial. Aorta: The aortic root and ascending aorta are structurally normal, with no evidence of dilitation. Venous: The inferior vena cava is normal in size with greater than 50% respiratory variability, suggesting right atrial pressure of 3 mmHg. IAS/Shunts: The atrial septum is grossly normal.  LEFT VENTRICLE PLAX 2D LVIDd:         5.30 cm     Diastology LVIDs:         4.10 cm     LV e' medial:  5.98 cm/s LV PW:         0.90 cm     LV e' lateral: 9.90 cm/s LV IVS:        0.90 cm LVOT diam:     1.90 cm LV SV:         46 LV SV Index:   30 LVOT Area:     2.84 cm  LV Volumes (MOD) LV vol d, MOD A2C: 78.4 ml LV vol d, MOD A4C: 72.9 ml LV vol s, MOD A2C: 37.3 ml LV vol s, MOD A4C: 43.5 ml LV SV MOD A2C:     41.1 ml LV SV MOD A4C:     72.9 ml LV SV MOD BP:      35.2 ml RIGHT VENTRICLE RV S prime:     12.60 cm/s TAPSE (M-mode): 1.4 cm LEFT ATRIUM              Index       RIGHT ATRIUM           Index LA diam:        4.20 cm 2.73 cm/m  RA Area:     10.60 cm LA Vol (A2C):   38.7 ml 25.19 ml/m RA Volume:   19.30 ml  12.56 ml/m LA  Vol (A4C):   30.0 ml 19.53 ml/m LA Biplane Vol: 34.8 ml 22.65 ml/m  AORTIC VALVE LVOT Vmax:   86.70 cm/s LVOT Vmean:  60.000 cm/s LVOT VTI:    0.162 m AI PHT:      201 msec  AORTA Ao Root diam: 3.10 cm Ao Asc diam:  3.35 cm TRICUSPID VALVE TR Peak grad:   35.8 mmHg TR Vmax:        299.00 cm/s  SHUNTS Systemic VTI:  0.16 m Systemic Diam: 1.90 cm Buford Dresser MD Electronically signed by Buford Dresser MD Signature Date/Time: 06/08/2020/7:57:36 PM    Final    VAS US CAROTID (at [facility] and [facility] only)  Result Date: 06/09/2020 Carotid Arterial Duplex Study Indications:      TIA. Risk Factors:     Hypertension, hyperlipidemia, Diabetes, coronary artery                   disease. Comparison Study: 09/06/19 previous Performing Technologist: Abram Sander RVS  Examination Guidelines: A complete evaluation includes B-mode imaging, spectral Doppler, color Doppler, and power Doppler as needed of all accessible portions of each vessel. Bilateral testing is considered an integral part of a complete examination. Limited examinations for reoccurring indications may be performed as noted.  Right Carotid Findings: +----------+--------+--------+--------+------------------+--------+           PSV cm/sEDV cm/sStenosisPlaque DescriptionComments +----------+--------+--------+--------+------------------+--------+ CCA Prox  68      12              heterogenous               +----------+--------+--------+--------+------------------+--------+ CCA Distal58      10              heterogenous               +----------+--------+--------+--------+------------------+--------+ ICA Prox  232     51      40-59%  heterogenous               +----------+--------+--------+--------+------------------+--------+ ICA Mid   76      21                                          +----------+--------+--------+--------+------------------+--------+ ICA Distal71      21                                         +----------+--------+--------+--------+------------------+--------+ ECA       101                                                +----------+--------+--------+--------+------------------+--------+ +----------+--------+-------+--------+-------------------+           PSV cm/sEDV cmsDescribeArm Pressure (mmHG) +----------+--------+-------+--------+-------------------+ [id number]                                         +----------+--------+-------+--------+-------------------+ +---------+--------+--+--------+-+---------+ VertebralPSV cm/s35EDV cm/s7Antegrade +---------+--------+--+--------+-+---------+  Left Carotid Findings: +----------+--------+--------+--------+------------------+--------+           PSV cm/sEDV cm/sStenosisPlaque DescriptionComments +----------+--------+--------+--------+------------------+--------+ CCA Prox  75      10  heterogenous               +----------+--------+--------+--------+------------------+--------+ CCA Distal73      17              heterogenous               +----------+--------+--------+--------+------------------+--------+ ICA Prox  96      21      1-39%   heterogenous               +----------+--------+--------+--------+------------------+--------+ ICA Distal106     33                                         +----------+--------+--------+--------+------------------+--------+ ECA       89                                                 +----------+--------+--------+--------+------------------+--------+ +----------+--------+--------+--------+-------------------+           PSV cm/sEDV cm/sDescribeArm Pressure (mmHG) +----------+--------+--------+--------+-------------------+ [id number]                                           +----------+--------+--------+--------+-------------------+ +---------+--------+--+--------+--+---------+ VertebralPSV cm/s71EDV cm/s20Antegrade +---------+--------+--+--------+--+---------+   Summary: Right Carotid: Velocities in the right ICA are consistent with a 40-59%                stenosis. Left Carotid: Velocities in the left ICA are consistent with a 1-39% stenosis. Vertebrals: Bilateral vertebral arteries demonstrate antegrade flow. *See table(s) above for measurements and observations.  Electronically signed by Antony Contras MD on 06/09/2020 at 11:36:30 AM.    Final    US Abdomen Limited RUQ (LIVER/GB)  Result Date: 06/09/2020 CLINICAL DATA:  Elevated ammonia EXAM: ULTRASOUND ABDOMEN LIMITED RIGHT UPPER QUADRANT COMPARISON:  None. FINDINGS: Gallbladder: No gallstones or wall thickening visualized. No sonographic Murphy sign noted by sonographer. Common bile duct: Diameter: 4 mm Liver: No focal lesion identified. Within normal limits in parenchymal echogenicity. Evaluation of left hepatic lobe suboptimal due to bowel gas. Portal vein is patent on color Doppler imaging with normal direction of blood flow towards the liver. Other: Incidental note made of a cyst in the upper pole right kidney. IMPRESSION: 1. Cyst in the upper pole right kidney. Otherwise negative right upper quadrant abdominal ultrasound Electronically Signed   By: Donavan Foil M.D.   On: 06/09/2020 17:02    Microbiology: Recent Results (from the past 240 hour(s))  SARS CORONAVIRUS 2 (TAT 6-24 HRS) Nasopharyngeal Nasopharyngeal Swab     Status: None   Collection Time: 06/08/20 11:03 AM   Specimen: Nasopharyngeal Swab  Result Value Ref Range Status   SARS Coronavirus 2 NEGATIVE NEGATIVE Final    Comment: (NOTE) SARS-CoV-2 target nucleic acids are NOT DETECTED.  The SARS-CoV-2 RNA is generally detectable in upper and lower respiratory specimens during the acute phase of infection. Negative results do not preclude  SARS-CoV-2 infection, do not rule out co-infections with other pathogens, and should not be used as the sole basis for treatment or other patient management decisions. Negative results must be combined with clinical observations, patient history, and epidemiological information. The expected result is Negative.  Fact Sheet for Patients:  SugarRoll.be  Fact Sheet for Healthcare Providers: https://www.woods-mathews.com/  This test is not yet approved or cleared by the [place] FDA and  has been authorized for detection and/or diagnosis of SARS-CoV-2 by FDA under an Emergency Use Authorization (EUA). This EUA will remain  in effect (meaning this test can be used) for the duration of the COVID-19 declaration under Se ction 564(b)(1) of the Act, 21 U.S.C. section 360bbb-3(b)(1), unless the authorization is terminated or revoked sooner.  Performed at [facility], [place] [address], [place], [place] [address]      Labs: Basic Metabolic Panel: Recent Labs  Lab 06/05/20 1056 06/08/20 0608 06/09/20 0335 06/10/20 0039  NA 138 135 139 136  K 5.4* 4.5 4.1 4.0  CL 102 105 106 107  CO2 27 23 23  21*  GLUCOSE 184* 363* 151* 149*  BUN 14 18 14 13   CREATININE 1.00 1.06 0.97 1.00  CALCIUM 9.6 8.9 8.9 8.7*   Liver Function Tests: Recent Labs  Lab 06/05/20 1056 06/08/20 0608 06/09/20 0335 06/10/20 0039  AST 11 10* 12* 13*  ALT 12 12 13 11   ALKPHOS  --  75 61 56  BILITOT 0.5 0.5 1.0 0.8  PROT 6.9 6.1* 6.3* 5.7*  ALBUMIN  --  2.9* 3.0* 2.7*   No results for input(s): LIPASE, AMYLASE in the last 168 hours. Recent Labs  Lab 06/09/20 0335  AMMONIA 70*   CBC: Recent Labs  Lab 06/05/20 1056 06/08/20 0608 06/10/20 0039  WBC 12.5* 10.8* 7.0  NEUTROABS 9,613* 8.2* 4.4  HGB 11.8* 10.6* 10.3*  HCT 39.9 34.5* 32.5*  MCV 66.6* 64.6* 64.5*  PLT 561* 435* 393   Cardiac Enzymes: No results for input(s): CKTOTAL, CKMB, CKMBINDEX,  TROPONINI in the last 168 hours. BNP: BNP (last 3 results) No results for input(s): BNP in the last 8760 hours.  ProBNP (last 3 results) No results for input(s): PROBNP in the last 8760 hours.  CBG: Recent Labs  Lab 06/09/20 0828 06/09/20 1127 06/09/20 1645 06/09/20 2134 06/10/20 0633  GLUCAP 119* 158* 72 234* 155*       Signed:  Nita Sells MD   [facility] 06/10/2020, 9:46 AM

## 2020-06-10 NOTE — Progress Notes (Signed)
Occupational Therapy Treatment Patient Details Name: William Haney MRN: [id number] DOB: 1950-12-24 Today's Date: 06/10/2020    History of present illness 70 y.o. male presenting with worsening AMS x several days. Patient admitted with acute metabolic encephalopathy with possible concern for early onset dementia.  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 Further work-up pending. PMHx significant for HTN, HLD, DM, COVID-19 03/2020, L TKA 12/2018, PVD with carotid stenosis, and CAD s/p CABG.   OT comments  Patient continues to make steady progress towards goals in skilled OT session. Patient's session encompassed pill box assessment, please refer to results below. Brother and daughter present for OT session to observe pill box findings, with pt able to count out the appropriate pills for the week, but placing all the pills in the morning slot. Extensive education provided to family and pt with regard to cognition, especially with regard to higher level cognitive tasks and due to performance therapist is recommending outpatient OT services to further assess cognition and performance. Family and patient in agreement. Discharge recommendations updated to reflect; therapy will continue to follow while in house.   Functional cognition further assessed with The Pillbox Test: A Measure of Executive Functioning and Estimate of Medication Management. A straight pass/fail designation is determined by 3 or more errors of omission or misplacement on the task. The pt completed the test with more than 3 errors, designating a failing score. See cognition section below.  Pill box: Pt completed pill box in 8 minutes and 30 seconds, where average time is less than 5 minutes for completion. Pt also demonstrated over 20 errors when completing pill box assessment, indicative of STM deficits and is at safety risk if performing higher level cognitive tasks independently  (money or medication management).   Follow Up Recommendations  Outpatient OT;Supervision/Assistance - 24 hour    Equipment Recommendations  None recommended by OT    Recommendations for Other Services      Precautions / Restrictions Precautions Precautions: Fall Restrictions Weight Bearing Restrictions: No       Mobility Bed Mobility               General bed mobility comments: up in recliner on arrival    Transfers Overall transfer level: Needs assistance Equipment used: None Transfers: Sit to/from Stand Sit to Stand: Supervision         General transfer comment: Supervision A for safety.    Balance Overall balance assessment: Mild deficits observed, not formally tested                                         ADL either performed or assessed with clinical judgement   ADL Overall ADL's : Needs assistance/impaired                                       General ADL Comments: Session focus on higher level cognitive tasks with pillbox test completed, refer to note for full details     Vision Baseline Vision/History: Wears glasses Patient Visual Report: No change from baseline     Perception     Praxis      Cognition Arousal/Alertness: Awake/alert Behavior During Therapy: WFL for tasks assessed/performed Overall Cognitive Status: Impaired/Different from baseline (family hinting at [facility] becoming  more apparent) Area of Impairment: Memory;Following commands                     Memory: Decreased short-term memory         General Comments: Patient A&Ox4. Noted STM deficits, able to read prompts on pillbox, unable to follow correctly        Exercises     Shoulder Instructions       General Comments      Pertinent Vitals/ Pain       Pain Assessment: No/denies pain  Home Living Family/patient expects to be discharged to:: Private residence Living Arrangements: Spouse/significant other Available Help  at Discharge: Family;Available 24 hours/day Type of Home: House Home Access: Stairs to enter [employer] of Steps: 2 Entrance Stairs-Rails: None Home Layout: One level     Bathroom Shower/Tub: Occupational psychologist: Standard     Home Equipment: Shower seat - built in;Cane - single point;Walker - 2 wheels;Wheelchair - manual          Prior Functioning/Environment Level of Independence: Independent        Comments: 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   Frequency  Min 2X/week        Progress Toward Goals  OT Goals(current goals can now be found in the care plan section)  Progress towards OT goals: Progressing toward goals  Acute Rehab OT Goals Patient Stated Goal: To return home. OT Goal Formulation: With patient Time For Goal Achievement: 06/23/20 Potential to Achieve Goals: Good  Plan Discharge plan needs to be updated    Co-evaluation                 AM-PAC OT "6 Clicks" Daily Activity     Outcome Measure   Help from another person eating meals?: None Help from another person taking care of personal grooming?: None Help from another person toileting, which includes using toliet, bedpan, or urinal?: None Help from another person bathing (including washing, rinsing, drying)?: None Help from another person to put on and taking off regular upper body clothing?: None Help from another person to put on and taking off regular lower body clothing?: None 6 Click Score: 24    End of Session Equipment Utilized During Treatment: Other (comment) (pill box test)  OT Visit Diagnosis: Muscle weakness (generalized) (M62.81)   Activity Tolerance Patient tolerated treatment well   Patient Left in chair;with call bell/phone within reach;with family/visitor present;with chair alarm set   Nurse Communication Mobility status        Time: 4650-3546 OT Time Calculation  (min): 26 min  Charges: OT General Charges $OT Visit: 1 Visit OT Treatments $Therapeutic Exercise: 23-37 mins  Corinne Ports E. Guiselle Mian, COTA/L Acute Rehabilitation Services [place] 06/10/2020, 12:57 PM

## 2020-06-12 ENCOUNTER — Telehealth: Payer: Self-pay | Admitting: *Deleted

## 2020-06-12 ENCOUNTER — Encounter: Payer: Self-pay | Admitting: Internal Medicine

## 2020-06-12 NOTE — Patient Instructions (Signed)

## 2020-06-12 NOTE — Progress Notes (Addendum)
Future Appointments  Date Time Provider [place]  06/13/2020  4:00 PM Unk Pinto, MD [facility] None  10/10/2020  9:30 AM Liane Comber, NP [facility] None  06/05/2021 10:00 AM Garnet Sierras, NP [facility] None   [facility] Follow[facility]Up     This very nice 70 y.o.  married [place] man was admitted to the hospital on  06/08/2020  and patient was discharged from the hospital on  06/10/2020 . The patient presents now 3 days post discharge for follow up for transition from recent hospitalization.  The day after discharge  our clinical staff contacted the patient to assure stability and schedule a follow up appointment. The discharge summary, medications and diagnostic test results were reviewed before meeting with the patient. The patient was admitted for:   Toxic metabolic encephalopathy Uncontrolled type 2 diabetes mellitus                           with hyperglycemia (HCC) Hyperammonemia (HCC) Meningioma (HCC) Essential hypertension Arteriosclerotic heart disease (ASHD)      Patient is [name] with HTN ASHD, chronically poorly controlled diabetes who was admitted thru the ER by EMS after being discovered at home confused, naked  & urinating on the bedroom floor. In the ER he was mentally dulled. Glucose in transit was reported in the 400's by EMS. Initial labs were unrevealing with glucose 363mg % after iv insulin .  (Patient was recommended  Insulin 5 days before hospitalization for an A1c 10.0% - but had not started the insulin). Patient admits poor med compliance over the last year.   CXR was negative. Head CT scan showed an incidental occipital meningoma. EEG was non-specific. Ammonia level was elevated at 70 and patient was referred at discharge to his Gastro Dr Verdia Kuba. Patient was rehydrated & gradually improved mentation. Patient was discharged for out-pt follow-up as he improved.       Hospitalization discharge instructions and medications are reconciled  with the patient.      Patient is also followed with Hypertension, Hyperlipidemia, Pre-Diabetes and Vitamin D Deficiency.      Patient is treated for HTN & BP has been controlled at home. Today's BP was 115/62. In 2008, patient underwent CABG and is followed by Dr Percival Spanish.  Patient has had no complaints of any cardiac type chest pain, palpitations, dyspnea/orthopnea/PND, dizziness, claudication, or dependent edema.     Hyperlipidemia is controlled with diet & meds. Patient denies myalgias or other med SE's. Last Lipids were near goal:  Lab Results  Component Value Date   CHOL 154 06/09/2020   HDL 28 (L) 06/09/2020   LDLCALC 95 06/09/2020   TRIG 157 (H) 06/09/2020   CHOLHDL 5.5 06/09/2020      Also, the patient has history of T2_NIDDM (2000)  w/CKD2  (GFR 72) and has had no symptoms of reactive hypoglycemia, diabetic polys, paresthesias or visual blurring.  Last A1c was not at goal:  Lab Results  Component Value Date   HGBA1C 10.3 (H) 06/08/2020      Further, the patient also has history of Vitamin D Deficiency ("37" /2017) and supplements vitamin D without any suspected side-effects. Last vitamin D was still low:   Lab Results  Component Value Date   VD25OH 38 09/07/2019    Current Outpatient Medications on File Prior to Visit  Medication Sig  . aspirin EC 81 M Take  daily  . VITAMIN D 5,000  u Take  daily.  [name] JARDIANCE 25 MG  Take 1 tablet  daily.  [name] glimepiride  4 MG  Take  1 tablet  2 x /day  with Meals  for Diabetes   . lactulose (CHRONULAC) 10 GM/15ML solution Take 30 mLs (20 g total) by mouth 2 (two) times daily.  . metFORMIN 500 MG  TAKE 2 TABLETS TWICE A DAY WITH MEALS   . metoprolol tartrate  25 MG  Take  1 tablet  2 x /day (every 12 hours)   . Multiple Vitamin  Take 1 tablet daily.  . Rosuvastatin 20 MG  TAKE 1 TABLET  EVERY DAY   . telmisartan 20 MG  TAKE 1 TABLET DAILY     Allergies  Allergen Reactions  . Ace Inhibitors Cough  . Statins Other (See Comments)     Elevated LFT's   PMHx:   Past Medical History:  Diagnosis Date  . Anemia   . Carotid artery stenosis 01/2017   L-ICA 1-39%, R-ICA 40-59% doppler  . Coronary artery disease    s/p CABG in 2008, w/ LIMA-LAD, SVG-Diag, SVG-OM, SVG-PL  . Diabetes mellitus type II   . Hyperlipidemia   . Hypertension   . Vitamin D deficiency    Immunization History  Administered Date(s) Administered  . DT  02/07/2015  . Influenza Whole 12/10/2012  . Influenza, High Dose Seasonal PF  12/13/2016, 01/01/2018, 11/19/2018, 02/21/2020  . Influenza 12/31/2013  . Meningococcal Conjugate 09/03/2016  . PFIZER SARS-COV-2 Vacc 04/02/2019, 04/23/2019  . Pneumococcal Conjugate - 13 02/02/2014  . Pneumococcal Polysaccharide - 23 04/13/2003, 10/08/2016  . Td 04/18/2004   Past Surgical History:  Procedure Laterality Date  . CORONARY ARTERY BYPASS GRAFT  2008   LIMA to the LAD, saphenous vein graft to diagonal, saphenous vein graft to OM, saphenous vein graft to posterolateral 2008  . TOTAL KNEE ARTHROPLASTY Left 12/18/2018   Procedure: TOTAL KNEE ARTHROPLASTY;  Surgeon: Dorna Leitz, MD;  Location: [facility];  Service: Orthopedics;  Laterality: Left;   FHx:    Reviewed / unchanged  SHx:    Reviewed / unchanged  Systems Review:  Constitutional: Denies fever, chills, wt changes, headaches, insomnia, fatigue, night sweats, change in appetite. Eyes: Denies redness, blurred vision, diplopia, discharge, itchy, watery eyes.  ENT: Denies discharge, congestion, post nasal drip, epistaxis, sore throat, earache, hearing loss, dental pain, tinnitus, vertigo, sinus pain, snoring.  CV: Denies chest pain, palpitations, irregular heartbeat, syncope, dyspnea, diaphoresis, orthopnea, PND, claudication or edema. Respiratory: denies cough, dyspnea, DOE, pleurisy, hoarseness, laryngitis, wheezing.  Gastrointestinal: Denies dysphagia, odynophagia, heartburn, reflux, water brash, abdominal pain or cramps, nausea, vomiting, bloating,  diarrhea, constipation, hematemesis, melena, hematochezia  or hemorrhoids. Genitourinary: Denies dysuria, frequency, urgency, nocturia, hesitancy, discharge, hematuria or flank pain. Musculoskeletal: Denies arthralgias, myalgias, stiffness, jt. swelling, pain, limping or strain/sprain.  Skin: Denies pruritus, rash, hives, warts, acne, eczema or change in skin lesion(s). Neuro: No weakness, tremor, incoordination, spasms, paresthesia or pain. Psychiatric: Denies confusion, memory loss or sensory loss. Endo: Denies change in weight, skin or hair change.  Heme/Lymph: No excessive bleeding, bruising or enlarged lymph nodes.  Physical Exam  BP 115/62 Comment: 100/54-sitting and 8660-standing  Pulse 76   Temp (!) 97.2 F (36.2 C)   Resp 16   Ht 5\' 1"  (1.549 m)   Wt 117 lb 3.2 oz (53.2 kg)   SpO2 97%   BMI 22.14 kg/m   Appears well nourished, well groomed  and in no distress.  Eyes: PERRLA, EOMs, conjunctiva no  swelling or erythema. Sinuses: No frontal/maxillary tenderness ENT/Mouth: EAC's clear, TM's nl w/o erythema, bulging. Nares clear w/o erythema, swelling, exudates. Oropharynx clear without erythema or exudates. Oral hygiene is good. Tongue normal, non obstructing. Hearing intact.  Neck: Supple. Thyroid nl. Car 2+/2+ without bruits, nodes or JVD. Chest: Respirations nl with BS clear & equal w/o rales, rhonchi, wheezing or stridor.  Cor: Heart sounds normal w/ regular rate and rhythm without sig. murmurs, gallops, clicks or rubs. Peripheral pulses normal and equal  without edema.  Abdomen: Soft & bowel sounds normal. Non-tender w/o guarding, rebound, hernias, masses or organomegaly.  Lymphatics: Unremarkable.  Musculoskeletal: Full ROM all peripheral extremities, joint stability, 5/5 strength and normal gait.  Skin: Warm, dry without exposed rashes, lesions or ecchymosis apparent.  Neuro: Cranial nerves intact, reflexes equal bilaterally. Sensory-motor testing grossly intact. Tendon  reflexes grossly intact.  Pysch: Alert & oriented x 3.  Insight and judgement nl & appropriate. No ideations.  Assessment and Plan:   1. Toxic metabolic encephalopathy  - COMPLETE METABOLIC PANEL WITH GFR - Magnesium  2. Uncontrolled type 2 diabetes mellitus with hyperglycemia ([place])  - Discussed with patient & daughter the importance of regular close f/u and  taking his diabetic meds as prescribed as well as regular monitoring of his CBG's.    - COMPLETE METABOLIC PANEL WITH GFR  3. Hyperammonemia (HCC)  - Gamma GT - Ammonia  4. Abnormal LFTs  - Gamma GT - Ammonia  5. Meningioma ([place])   6. Essential hypertension  - Continue medication, monitor blood pressure at home.  - Continue DASH diet.  Reminder to go to the ER if any CP,  SOB, nausea, dizziness, severe HA, changes vision/speech.  - CBC with Differential/Platelet - COMPLETE METABOLIC PANEL WITH GFR - Magnesium  7. Arteriosclerotic heart disease (ASHD)   8. Atherosclerosis of native coronary artery of native heart without angina pectoris   9. Hyperlipidemia associated with type 2 diabetes mellitus ([place])  - Continue diet/meds, exercise,& lifestyle modifications.  - Continue monitor periodic cholesterol/liver & renal functions    10. Medication management  - CBC with Differential/Platelet - COMPLETE METABOLIC PANEL WITH GFR - Magnesium - Gamma GT - Ammonia       Discussed  regular exercise, BP monitoring, weight control to achieve/maintain BMI less than 25 and discussed meds and SE's. Recommended labs to assess and monitor clinical status with further disposition pending results of labs. Over 30 minutes of exam, counseling, chart review was performed.   Kirtland Bouchard, MD

## 2020-06-12 NOTE — Telephone Encounter (Signed)
Called patient on 06/12/2020 , 9:49 AM in an attempt to reach the patient for a hospital follow up. Spoke with patient, who states he is feeling much better.  Admit date: 06/08/20 Discharge: 06/10/20   He does not have any questions or concerns about medications from the hospital admission. The patient's medications were reviewed over the phone, they were counseled to bring in all current medications to the hospital follow up visit.   I advised the patient to call if any questions or concerns arise about the hospital admission or medications. Patient understood medication changes.    Home health was not started in the hospital.  All questions were answered and a follow up appointment was made. Hospital follow up appointment 06/13/2020, with Dr Melford Aase.  Prior to Admission medications   Medication Sig Start Date End Date Taking? Authorizing Provider  aspirin EC 81 MG tablet Take 81 mg by mouth daily. Swallow whole.    [provider]  blood glucose meter kit and supplies Test sugars once to twice daily 08/24/18   Vladimir Crofts, PA-C  Cholecalciferol (VITAMIN D3) 125 MCG (5000 UT) CAPS Take 5,000 Units by mouth daily.    [provider]  empagliflozin (JARDIANCE) 25 MG TABS tablet Take 1 tablet (25 mg total) by mouth daily. 02/21/20   Liane Comber, NP  glimepiride (AMARYL) 4 MG tablet Take  1 tablet  2 x /day  with Meals  for Diabetes Patient taking differently: Take 4 mg by mouth in the morning and at bedtime. 05/06/20   Unk Pinto, MD  lactulose (CHRONULAC) 10 GM/15ML solution Take 30 mLs (20 g total) by mouth 2 (two) times daily. 06/10/20   Nita Sells, MD  metFORMIN (GLUCOPHAGE) 500 MG tablet TAKE 2 TABLETS TWICE A DAY WITH MEALS FOR DIABETES Patient taking differently: Take 1,000 mg by mouth 2 (two) times daily with a meal. 02/22/20   Unk Pinto, MD  metoprolol tartrate (LOPRESSOR) 25 MG tablet Take      1 tablet       2 x /day (every 12 hours)         for BP Patient taking differently: Take 25 mg by mouth 2 (two) times daily. 02/22/20   Unk Pinto, MD  Multiple Vitamin (MULTIVITAMIN) tablet Take 1 tablet by mouth daily.    [provider]  rosuvastatin (CRESTOR) 20 MG tablet TAKE 1 TABLET BY MOUTH EVERY DAY FOR CHOLESTEROL Patient taking differently: Take 20 mg by mouth daily. 05/21/19   Vladimir Crofts, PA-C  telmisartan (MICARDIS) 20 MG tablet TAKE 1 TABLET DAILY FOR BP & KIDNEY PROTECTION Patient taking differently: Take 20 mg by mouth daily. 02/22/20   Unk Pinto, MD

## 2020-06-13 ENCOUNTER — Encounter: Payer: Self-pay | Admitting: Internal Medicine

## 2020-06-13 ENCOUNTER — Ambulatory Visit (INDEPENDENT_AMBULATORY_CARE_PROVIDER_SITE_OTHER): Payer: Medicare Other | Admitting: Internal Medicine

## 2020-06-13 ENCOUNTER — Other Ambulatory Visit: Payer: Self-pay

## 2020-06-13 DIAGNOSIS — G928 Other toxic encephalopathy: Secondary | ICD-10-CM

## 2020-06-13 DIAGNOSIS — E1169 Type 2 diabetes mellitus with other specified complication: Secondary | ICD-10-CM | POA: Diagnosis not present

## 2020-06-13 DIAGNOSIS — E1165 Type 2 diabetes mellitus with hyperglycemia: Secondary | ICD-10-CM | POA: Diagnosis not present

## 2020-06-13 DIAGNOSIS — R945 Abnormal results of liver function studies: Secondary | ICD-10-CM

## 2020-06-13 DIAGNOSIS — D329 Benign neoplasm of meninges, unspecified: Secondary | ICD-10-CM | POA: Diagnosis not present

## 2020-06-13 DIAGNOSIS — Z79899 Other long term (current) drug therapy: Secondary | ICD-10-CM

## 2020-06-13 DIAGNOSIS — E785 Hyperlipidemia, unspecified: Secondary | ICD-10-CM

## 2020-06-13 DIAGNOSIS — R7989 Other specified abnormal findings of blood chemistry: Secondary | ICD-10-CM

## 2020-06-13 DIAGNOSIS — I10 Essential (primary) hypertension: Secondary | ICD-10-CM | POA: Diagnosis not present

## 2020-06-13 DIAGNOSIS — E7220 Disorder of urea cycle metabolism, unspecified: Secondary | ICD-10-CM

## 2020-06-13 DIAGNOSIS — I2510 Atherosclerotic heart disease of native coronary artery without angina pectoris: Secondary | ICD-10-CM

## 2020-06-14 LAB — CBC WITH DIFFERENTIAL/PLATELET
Absolute Monocytes: 1099 cells/uL — ABNORMAL HIGH (ref 200–950)
Basophils Absolute: 47 cells/uL (ref 0–200)
Basophils Relative: 0.7 %
Eosinophils Absolute: 101 cells/uL (ref 15–500)
Eosinophils Relative: 1.5 %
HCT: 39.7 % (ref 38.5–50.0)
Hemoglobin: 11.9 g/dL — ABNORMAL LOW (ref 13.2–17.1)
Lymphs Abs: 1481 cells/uL (ref 850–3900)
MCH: 20.1 pg — ABNORMAL LOW (ref 27.0–33.0)
MCHC: 30 g/dL — ABNORMAL LOW (ref 32.0–36.0)
MCV: 66.9 fL — ABNORMAL LOW (ref 80.0–100.0)
MPV: 12.4 fL (ref 7.5–12.5)
Monocytes Relative: 16.4 %
Neutro Abs: 3973 cells/uL (ref 1500–7800)
Neutrophils Relative %: 59.3 %
Platelets: 448 10*3/uL — ABNORMAL HIGH (ref 140–400)
RBC: 5.93 10*6/uL — ABNORMAL HIGH (ref 4.20–5.80)
RDW: 18.5 % — ABNORMAL HIGH (ref 11.0–15.0)
Total Lymphocyte: 22.1 %
WBC: 6.7 10*3/uL (ref 3.8–10.8)

## 2020-06-14 LAB — COMPLETE METABOLIC PANEL WITH GFR
AG Ratio: 1.2 (calc) (ref 1.0–2.5)
ALT: 13 U/L (ref 9–46)
AST: 14 U/L (ref 10–35)
Albumin: 3.7 g/dL (ref 3.6–5.1)
Alkaline phosphatase (APISO): 81 U/L (ref 35–144)
BUN: 13 mg/dL (ref 7–25)
CO2: 24 mmol/L (ref 20–32)
Calcium: 9.7 mg/dL (ref 8.6–10.3)
Chloride: 100 mmol/L (ref 98–110)
Creat: 1.09 mg/dL (ref 0.70–1.18)
GFR, Est African American: 78 mL/min/{1.73_m2} (ref >=60)
GFR, Est Non African American: 67 mL/min/{1.73_m2} (ref >=60)
Globulin: 3 g/dL (calc) (ref 1.9–3.7)
Glucose, Bld: 105 mg/dL — ABNORMAL HIGH (ref 65–99)
Potassium: 5.2 mmol/L (ref 3.5–5.3)
Sodium: 136 mmol/L (ref 135–146)
Total Bilirubin: 0.4 mg/dL (ref 0.2–1.2)
Total Protein: 6.7 g/dL (ref 6.1–8.1)

## 2020-06-14 LAB — AMMONIA: Ammonia: 19 umol/L (ref <=72)

## 2020-06-14 LAB — MAGNESIUM: Magnesium: 1.9 mg/dL (ref 1.5–2.5)

## 2020-06-14 LAB — GAMMA GT: GGT: 15 U/L (ref 3–70)

## 2020-06-14 NOTE — Progress Notes (Signed)
============================================================ -   Test results slightly outside the reference range are not unusual. If there is anything important, I will review this with you,  otherwise it is considered normal test values.  If you have further questions,  please do not hesitate to contact me at the office or via My Chart.  ============================================================ ============================================================  -  CBC shows Red cell count (Hgb) is much better  almost back to Normal   ============================================================ ============================================================  - Glucose = 105 - Normal - Great   ============================================================ ============================================================  -  Kidneys - Electrolytes - Liver - Magnesium   - all  Normal / OK  ============================================================ ============================================================  - Your ammonia level is back to Normal, So I do not feel that                                  you need to continue the Lactulose - So you may stop it   ============================================================ ============================================================  - We will recheck all of these labs when you return in a month   ============================================================ ============================================================

## 2020-06-21 LAB — VITAMIN B1: Vitamin B1 (Thiamine): 113.2 nmol/L (ref 66.5–200.0)

## 2020-06-22 ENCOUNTER — Other Ambulatory Visit: Payer: Self-pay

## 2020-06-22 MED ORDER — ROSUVASTATIN CALCIUM 20 MG PO TABS: ORAL_TABLET | ORAL | 1 refills | Status: DC

## 2020-06-28 NOTE — Addendum Note (Signed)
Addended byGarnet Sierras A on: 06/28/2020 10:01 AM   Modules accepted: Orders

## 2020-07-03 DIAGNOSIS — M1711 Unilateral primary osteoarthritis, right knee: Secondary | ICD-10-CM | POA: Diagnosis not present

## 2020-07-03 DIAGNOSIS — M25561 Pain in right knee: Secondary | ICD-10-CM | POA: Diagnosis not present

## 2020-07-20 ENCOUNTER — Ambulatory Visit (INDEPENDENT_AMBULATORY_CARE_PROVIDER_SITE_OTHER): Payer: Medicare Other | Admitting: Internal Medicine

## 2020-07-20 ENCOUNTER — Other Ambulatory Visit: Payer: Self-pay

## 2020-07-20 VITALS — BP 126/70 | HR 77 | Temp 97.5°F | Resp 16 | Ht 61.0 in | Wt 126.2 lb

## 2020-07-20 DIAGNOSIS — E1165 Type 2 diabetes mellitus with hyperglycemia: Secondary | ICD-10-CM

## 2020-07-20 DIAGNOSIS — I2510 Atherosclerotic heart disease of native coronary artery without angina pectoris: Secondary | ICD-10-CM

## 2020-07-20 DIAGNOSIS — I10 Essential (primary) hypertension: Secondary | ICD-10-CM | POA: Diagnosis not present

## 2020-07-20 DIAGNOSIS — Z79899 Other long term (current) drug therapy: Secondary | ICD-10-CM

## 2020-07-20 NOTE — Patient Instructions (Signed)

## 2020-07-20 NOTE — Progress Notes (Signed)
Future Appointments  Date Time Provider [place]  07/20/2020  4:00 PM Unk Pinto, MD [facility] None  10/10/2020  9:30 AM Liane Comber, NP [facility] None  06/05/2021 10:00 AM Garnet Sierras, NP [facility] None    History of Present Illness:    Patient is [name] with HTN ASHD, chronically poorly controlled diabetes who was hospitalized in early April with poorly controlled Diabetes, toxic metabolic encephalopathy, elevated serum Ammonia who was admitted with a blood sugar in the 400's and was rehydrated by IVF and elevated glucose was  managed by insulin and when stabilized,  he was discharged . Discharge meds included Metformin 2,000 mg, Jardiance 25 mg qd and  Glimepiride 4 mg bid He reports home bid glucoses are ranging 120-130 mg%.   Medications  .  JARDIANCE) 25 MG t, Take 1 tablet  daily. [name]  glimepiride  4 MG tablet, Take  1 tablet  2 x /day   .  metFORMIN 500 MG tablet, TAKE 2 TABLETS TWICE A DAY   .  metoprolol tartrate25 MG , Take 1 tablet 2 x /day .  rosuvastatin 20 MG , TAKE 1 TABLET  EVERY DAY .  telmisartan  20 MG t, TAKE 1 TABLET DAILY  .  aspirin EC 81 MG tablet, Take 81 mg by mouth daily. Swallow whole.  [name]  VITAMIN D 5000 u , Take 5,000 Units by mouth daily. [name]  lactulose  10 GM/15ML solution, Take 30 mLs 2  times daily. .  Multiple Vitamin , Take 1 tablet daily.  Problem list He has Hyperlipidemia associated with type 2 diabetes mellitus ([place]); Coronary atherosclerosis; Carotid stenosis; Essential hypertension; Vitamin D deficiency; Medication management; Encounter for Medicare annual wellness exam; Neuropathy, diabetic ([place]); DM type 2 (diabetes mellitus, type 2) ([place]); Poor compliance; Overweight (BMI 25.0-29.9); Diabetic retinopathy of both eyes ([place]); beta thalassemia minor (trait) anemia; Primary osteoarthritis of left knee; Aorto-iliac atherosclerosis ([place]); CKD stage 2 due to type 2 diabetes mellitus ([place]); COVID-19 (03/31/2020);  Acute metabolic encephalopathy; ACS (acute coronary syndrome) ([place]); and Malnutrition of moderate degree on their problem list.   Observations/Objective:  BP 126/70   Pulse 77   Temp (!) 97.5 F (36.4 C)   Resp 16   Ht 5\' 1"  (1.549 m)   Wt 126 lb 3.2 oz (57.2 kg)   SpO2 96%   BMI 23.85 kg/m   HEENT - WNL. Neck - supple.  Chest - Clear equal BS. Cor - Nl HS. RRR w/o sig MGR. PP 1(+). No edema. MS- FROM w/o deformities.  Gait Nl. Neuro -  Nl w/o focal abnormalities.   Assessment and Plan:  1. Uncontrolled type 2 diabetes mellitus with hyperglycemia (HCC)  - Fructosamine - COMPLETE METABOLIC PANEL WITH GFR - Insulin, random  2. Essential hypertension  - COMPLETE METABOLIC PANEL WITH GFR  3. Arteriosclerotic heart disease (ASHD)   4. Medication management  - Fructosamine - COMPLETE METABOLIC PANEL WITH GFR - Insulin, random   Follow Up Instructions:        I discussed the assessment and treatment plan with the patient. The patient was provided an opportunity to ask questions and all were answered. The patient agreed with the plan and demonstrated an understanding of the instructions.        The patient was advised to call back or seek an in-person evaluation if the symptoms worsen or if the condition fails to improve as anticipated.    Kirtland Bouchard, MD

## 2020-07-23 ENCOUNTER — Encounter: Payer: Self-pay | Admitting: Internal Medicine

## 2020-07-25 LAB — COMPLETE METABOLIC PANEL WITH GFR
AG Ratio: 1.9 (calc) (ref 1.0–2.5)
ALT: 16 U/L (ref 9–46)
AST: 17 U/L (ref 10–35)
Albumin: 4.3 g/dL (ref 3.6–5.1)
Alkaline phosphatase (APISO): 69 U/L (ref 35–144)
BUN: 19 mg/dL (ref 7–25)
CO2: 27 mmol/L (ref 20–32)
Calcium: 10 mg/dL (ref 8.6–10.3)
Chloride: 99 mmol/L (ref 98–110)
Creat: 1 mg/dL (ref 0.70–1.18)
GFR, Est African American: 87 mL/min/{1.73_m2} (ref >=60)
GFR, Est Non African American: 75 mL/min/{1.73_m2} (ref >=60)
Globulin: 2.3 g/dL (calc) (ref 1.9–3.7)
Glucose, Bld: 60 mg/dL — ABNORMAL LOW (ref 65–99)
Potassium: 5 mmol/L (ref 3.5–5.3)
Sodium: 134 mmol/L — ABNORMAL LOW (ref 135–146)
Total Bilirubin: 0.5 mg/dL (ref 0.2–1.2)
Total Protein: 6.6 g/dL (ref 6.1–8.1)

## 2020-07-25 LAB — INSULIN, RANDOM: Insulin: 7.1 u[IU]/mL

## 2020-07-25 LAB — FRUCTOSAMINE: Fructosamine: 312 umol/L — ABNORMAL HIGH (ref 205–285)

## 2020-07-25 NOTE — Progress Notes (Signed)
============================================================ -   Test results slightly outside the reference range are not unusual. If there is anything important, I will review this with you,  otherwise it is considered normal test values.  If you have further questions,  please do not hesitate to contact me at the office or via My Chart.  ============================================================ ============================================================  -  Fructosamine finally returned = 312  and   approximates an A1c = 7.5 %   Which is still too high  (  Ideal or goal A1c is less than 5.7%  )   - So you need to work a little harder with diet    - Avoid Sweets, Candy & White Stuff   - and Avoid White Rice, White Potatoes, White Flour  - [place]   Being diabetic has a  300% increased risk for heart attack,  stroke, cancer, and alzheimer- type vascular dementia.   It is very important that you work harder with diet by  avoiding all foods that are white except chicken,   fish & calliflower.  - Avoid white rice  (brown & wild rice is OK),   - Avoid white potatoes  (sweet potatoes in moderation is OK),   White bread or wheat bread or anything made out of   white flour like bagels, donuts, rolls, buns, biscuits, cakes,  - pastries, cookies, pizza crust, and pasta (made from  white flour & egg whites)   - vegetarian pasta or spinach or wheat pasta is OK.  - Multigrain breads like Arnold's, Pepperidge Farm or   multigrain sandwich thins or high fiber breads like   [place] bread or "Dave's Killer" breads that are  4 to 5 grams fiber per slice !  are best.

## 2020-07-31 DIAGNOSIS — M25561 Pain in right knee: Secondary | ICD-10-CM | POA: Diagnosis not present

## 2020-07-31 DIAGNOSIS — M1711 Unilateral primary osteoarthritis, right knee: Secondary | ICD-10-CM | POA: Diagnosis not present

## 2020-08-03 ENCOUNTER — Telehealth: Payer: Self-pay

## 2020-08-03 NOTE — Telephone Encounter (Signed)
   Name: Choice Kleinsasser  DOB: 1951-01-27  MRN: [id number]  Primary Cardiologist: Minus Breeding, MD  Chart reviewed as part of pre-operative protocol coverage. Because of Angeldejesus Callaham past medical history and time since last visit, he will require a follow-up visit in order to better assess preoperative cardiovascular risk.  Pre-op covering staff: - Please schedule appointment and call patient to inform them. If patient already had an upcoming appointment within acceptable timeframe, please add "pre-op clearance" to the appointment notes so provider is aware. - Please contact requesting surgeon's office via preferred method (i.e, phone, fax) to inform them of need for appointment prior to surgery.  If applicable, this message will also be routed to pharmacy pool and/or primary cardiologist for input on holding anticoagulant/antiplatelet agent as requested below so that this information is available to the clearing provider at time of patient's appointment.   Christell Faith, PA-C  08/03/2020, 1:11 PM

## 2020-08-03 NOTE — Telephone Encounter (Signed)
   [facility] Pre-operative Risk Assessment    Patient Name: William Haney  DOB: 04/13/1950  MRN: [id number]  Request for surgical clearance:  1. What type of surgery is being performed? RIGHT TOTAL KNEE ARTHROPLASTY   2. When is this surgery scheduled? TBD   3. What type of clearance is required (medical clearance vs. Pharmacy clearance to hold med vs. Both)? BOTH  4. Are there any medications that need to be held prior to surgery and how long? NONE LISTED-TAKES ASA   5. Practice name and name of physician performing surgery? [facility], G[facility], MD ATTN:  JUDY DANIELS   6. What is the office phone number? [phone]   7.   What is the office fax number? [phone]  8.   Anesthesia type (None, local, MAC, general) ? SPINAL

## 2020-08-03 NOTE — Telephone Encounter (Signed)
Left message to call the office to schedule an appt for pre op clearance.

## 2020-08-04 NOTE — Telephone Encounter (Signed)
2nd attempt to reach pt regarding surgical clearance and the need for an appointment.  Left detailed message for pt to call back to schedule.

## 2020-08-08 NOTE — Telephone Encounter (Signed)
I s/w the pt today and informed him that he will need an appt for pre op clearance. Pt offered and scheduled for 08/30/20 with Dr. Percival Spanish @ 10 am. Pt states his surgery is scheduled for 08/23/20. I stated to the pt that we had TBD and no surgery date as of yet when our office received the clearance request. I assured the pt it is not our goal to delay surgery/procedures for our pt's, though our schedules have been very full. I did assure the pt that I will place him on a wait list for possible sooner appt if someone cancels. I assured the pt that I will let surgeon's office know as well that we did not have any openings until 6/22. Pt is grateful for the help and the call today. I will forward clearance notes to MD for upcoming appt as well as FYI to surgeon's office.

## 2020-08-08 NOTE — Telephone Encounter (Signed)
Follow up:    Patient returning a call back from last week.

## 2020-08-09 ENCOUNTER — Other Ambulatory Visit: Payer: Self-pay | Admitting: Internal Medicine

## 2020-08-09 ENCOUNTER — Other Ambulatory Visit: Payer: Medicare Other

## 2020-08-09 ENCOUNTER — Other Ambulatory Visit: Payer: Self-pay

## 2020-08-09 DIAGNOSIS — N182 Chronic kidney disease, stage 2 (mild): Secondary | ICD-10-CM

## 2020-08-09 DIAGNOSIS — E1122 Type 2 diabetes mellitus with diabetic chronic kidney disease: Secondary | ICD-10-CM | POA: Diagnosis not present

## 2020-08-09 DIAGNOSIS — Z01818 Encounter for other preprocedural examination: Secondary | ICD-10-CM

## 2020-08-09 DIAGNOSIS — Z79899 Other long term (current) drug therapy: Secondary | ICD-10-CM | POA: Diagnosis not present

## 2020-08-09 DIAGNOSIS — I10 Essential (primary) hypertension: Secondary | ICD-10-CM | POA: Diagnosis not present

## 2020-08-10 LAB — COMPLETE METABOLIC PANEL WITHOUT GFR
AG Ratio: 1.7 (calc) (ref 1.0–2.5)
ALT: 27 U/L (ref 9–46)
AST: 23 U/L (ref 10–35)
Albumin: 4.3 g/dL (ref 3.6–5.1)
Alkaline phosphatase (APISO): 74 U/L (ref 35–144)
BUN: 20 mg/dL (ref 7–25)
CO2: 29 mmol/L (ref 20–32)
Calcium: 9.9 mg/dL (ref 8.6–10.3)
Chloride: 101 mmol/L (ref 98–110)
Creat: 1.14 mg/dL (ref 0.70–1.18)
GFR, Est African American: 74 mL/min/{1.73_m2}
GFR, Est Non African American: 64 mL/min/{1.73_m2}
Globulin: 2.5 g/dL (ref 1.9–3.7)
Glucose, Bld: 136 mg/dL — ABNORMAL HIGH (ref 65–99)
Potassium: 5.3 mmol/L (ref 3.5–5.3)
Sodium: 137 mmol/L (ref 135–146)
Total Bilirubin: 0.5 mg/dL (ref 0.2–1.2)
Total Protein: 6.8 g/dL (ref 6.1–8.1)

## 2020-08-10 LAB — CBC WITH DIFFERENTIAL/PLATELET
Absolute Monocytes: 1020 {cells}/uL — ABNORMAL HIGH (ref 200–950)
Basophils Absolute: 68 {cells}/uL (ref 0–200)
Basophils Relative: 0.9 %
Eosinophils Absolute: 383 {cells}/uL (ref 15–500)
Eosinophils Relative: 5.1 %
HCT: 39.7 % (ref 38.5–50.0)
Hemoglobin: 11.6 g/dL — ABNORMAL LOW (ref 13.2–17.1)
Lymphs Abs: 960 {cells}/uL (ref 850–3900)
MCH: 20.2 pg — ABNORMAL LOW (ref 27.0–33.0)
MCHC: 29.2 g/dL — ABNORMAL LOW (ref 32.0–36.0)
MCV: 69 fL — ABNORMAL LOW (ref 80.0–100.0)
Monocytes Relative: 13.6 %
Neutro Abs: 5070 {cells}/uL (ref 1500–7800)
Neutrophils Relative %: 67.6 %
Platelets: 316 10*3/uL (ref 140–400)
RBC: 5.75 Million/uL (ref 4.20–5.80)
RDW: 16.8 % — ABNORMAL HIGH (ref 11.0–15.0)
Total Lymphocyte: 12.8 %
WBC: 7.5 10*3/uL (ref 3.8–10.8)

## 2020-08-10 LAB — HEMOGLOBIN A1C
Hgb A1c MFr Bld: 8.3 % of total Hgb — ABNORMAL HIGH (ref <5.7)
Mean Plasma Glucose: 192 mg/dL
eAG (mmol/L): 10.6 mmol/L

## 2020-08-10 LAB — PROTIME-INR
INR: 1
Prothrombin Time: 9.9 s (ref 9.0–11.5)

## 2020-08-11 NOTE — Progress Notes (Signed)
============================================================ -   Test results slightly outside the reference range are not unusual. If there is anything important, I will review this with you,  otherwise it is considered normal test values.  If you have further questions,  please do not hesitate to contact me at the office or via My Chart.  ============================================================ ============================================================  -  CBC still shows chronic mild Anemia which appears Low Iron  - PLEASE  - Take an over the counter iron supplement 2 x /day   ============================================================= >>>>>>>>>>>>>>>>>>>>>>>>>>>>>>>>>>>>>>>>>>>>>>>>>>>>>>>>>>>>> =============================================================  -  A1c is down from 10.3% to now 8.3% - BUT Still Way too high to have surgery   - your A1c   ->   Must be below 7.5% for you to have Surgery    -   So . . . . . . . . .  - You must work harder on your diet & Diabetic control  ============================================================= >>>>>>>>>>>>>>>>>>>>>>>>>>>>>>>>>>>>>>>>>>>>>>>>>>>>>>>>>>>>> =============================================================  -  Suggest repeat your A1c in about 6 weeks to see if it's better  ============================================================ ============================================================

## 2020-08-14 DIAGNOSIS — I2510 Atherosclerotic heart disease of native coronary artery without angina pectoris: Secondary | ICD-10-CM | POA: Insufficient documentation

## 2020-08-14 NOTE — Progress Notes (Signed)
Cardiology Office Note   Date:  08/15/2020   ID:  William Haney, DOB 20-May-1950, MRN [id number]  PCP:  Unk Pinto, MD  Cardiologist:   Minus Breeding, MD   Chief Complaint  Patient presents with  . Coronary Artery Disease      History of Present Illness: William Haney is a 70 y.o. male who presents for follow up of CAD.  The patient has had CABG as below.  I last saw him in 2020.  He is being seen preop for TKR.   Since I last saw him he has done very well.  He pushes a lawnmower.  He does other extensive yard work.  He gets none of the shortness of breath that was his previous anginal equivalent. The patient denies any new symptoms such as chest discomfort, neck or arm discomfort. There has been no new shortness of breath, PND or orthopnea. There have been no reported palpitations, presyncope or syncope.     Past Medical History:  Diagnosis Date  . Anemia   . Carotid artery stenosis 01/2017   L-ICA 1-39%, R-ICA 40-59% doppler  . Coronary artery disease    s/p CABG in 2008, w/ LIMA-LAD, SVG-Diag, SVG-OM, SVG-PL  . Hypertension   . Vitamin D deficiency     Past Surgical History:  Procedure Laterality Date  . CORONARY ARTERY BYPASS GRAFT  2008   LIMA to the LAD, saphenous vein graft to diagonal, saphenous vein graft to OM, saphenous vein graft to posterolateral 2008  . TOTAL KNEE ARTHROPLASTY Left 12/18/2018   Procedure: TOTAL KNEE ARTHROPLASTY;  Surgeon: Dorna Leitz, MD;  Location: [facility];  Service: Orthopedics;  Laterality: Left;     Current Outpatient Medications  Medication Sig Dispense Refill  . aspirin EC 81 MG tablet Take 81 mg by mouth daily. Swallow whole.    . blood glucose meter kit and supplies Test sugars once to twice daily 1 each 0  . Cholecalciferol (VITAMIN D3) 125 MCG (5000 UT) CAPS Take 5,000 Units by mouth daily.    . empagliflozin (JARDIANCE) 25 MG TABS tablet Take 1 tablet (25 mg total) by mouth daily. 90 tablet 3  . glimepiride (AMARYL) 4  MG tablet Take  1 tablet  2 x /day  with Meals  for Diabetes (Patient taking differently: Take 4 mg by mouth in the morning and at bedtime.) 180 tablet 0  . lactulose (CHRONULAC) 10 GM/15ML solution Take 30 mLs (20 g total) by mouth 2 (two) times daily. 236 mL 0  . metFORMIN (GLUCOPHAGE) 500 MG tablet TAKE 2 TABLETS TWICE A DAY WITH MEALS FOR DIABETES (Patient taking differently: Take 1,000 mg by mouth 2 (two) times daily with a meal.) 360 tablet 3  . metoprolol tartrate (LOPRESSOR) 25 MG tablet Take      1 tablet       2 x /day (every 12 hours)        for BP (Patient taking differently: Take 25 mg by mouth 2 (two) times daily.) 180 tablet 0  . Multiple Vitamin (MULTIVITAMIN) tablet Take 1 tablet by mouth daily.    [name] telmisartan (MICARDIS) 20 MG tablet TAKE 1 TABLET DAILY FOR BP & KIDNEY PROTECTION (Patient taking differently: Take 20 mg by mouth daily.) 90 tablet 3  . rosuvastatin (CRESTOR) 40 MG tablet TAKE 1 TABLET BY MOUTH EVERY DAY FOR CHOLESTEROL 90 tablet 3   No current facility-administered medications for this visit.    Allergies:   Ace inhibitors and Statins  ROS:  Please see the history of present illness.   Otherwise, review of systems are positive for none.   All other systems are reviewed and negative.    PHYSICAL EXAM: VS:  BP (!) 148/68   Pulse 75   Ht _0  (1.575 m)   Wt 125 lb 9.6 oz (57 kg)   SpO2 99%   BMI 22.97 kg/m  , BMI Body mass index is 22.97 kg/m. GENERAL:  Well appearing NECK:  No jugular venous distention, waveform within normal limits, carotid upstroke brisk and symmetric, no bruits, no thyromegaly LUNGS:  Clear to auscultation bilaterally CHEST:  Well healed sternotomy scar. HEART:  PMI not displaced or sustained,S1 and S2 within normal limits, no S3, no S4, no clicks, no rubs, no murmurs ABD:  Flat, positive bowel sounds normal in frequency in pitch, no bruits, no rebound, no guarding, no midline pulsatile mass, no hepatomegaly, no splenomegaly EXT:   2 plus pulses throughout, no edema, no cyanosis no clubbing      EKG:  EKG is  ordered today. The ekg ordered today demonstrates sinus rhythm, rate 75 axis within normal limits, early transition lead V2, no acute ST-T wave changes.  Please note that the gain was 2.5 mm/mV.  Voltage is not low.   Recent Labs: 06/08/2020: TSH 1.809 06/13/2020: Magnesium 1.9 08/09/2020: ALT 27; BUN 20; Creat 1.14; Hemoglobin 11.6; Platelets 316; Potassium 5.3; Sodium 137    Lipid Panel    Component Value Date/Time   CHOL 154 06/09/2020 0335   TRIG 157 (H) 06/09/2020 0335   HDL 28 (L) 06/09/2020 0335   CHOLHDL 5.5 06/09/2020 0335   VLDL 31 06/09/2020 0335   LDLCALC 95 06/09/2020 0335   LDLCALC 82 06/05/2020 1056      Wt Readings from Last 3 Encounters:  08/15/20 125 lb 9.6 oz (57 kg)  07/20/20 126 lb 3.2 oz (57.2 kg)  06/13/20 117 lb 3.2 oz (53.2 kg)      Other studies Reviewed: Additional studies/ records that were reviewed today include: Labs. Review of the above records demonstrates:  Please see elsewhere in the note.     ASSESSMENT AND PLAN:  CAD/CABG:   The patient has no new sypmtoms.  No further cardiovascular testing is indicated.  We will continue with aggressive risk reduction and meds as listed.  DIABETES: His A1c is 8.3.  This was down from 10.3 most recently.  He is going to follow-up with Unk Pinto, MD to discuss this.  We reviewed this extensively today.  He has a good diet.   DYSLIPIDEMIA: His lipids are 95 LDL and HDL of 28.  He agrees to take Crestor 40 and get a lipid and liver profile in 3 months.   CAROTID STENOSIS:   He had 40 to 59% stenosis in March of this year.  We will follow this again in 1 year.  PREOP: The patient has a very high functional level.  He has no high risk findings.  He is going for a procedure that is high risk from a cardiac standpoint.  Therefore, according to the ACC/AHA guidelines no further testing is indicated.   Current medicines  are reviewed at length with the patient today.  The patient does not have concerns regarding medicines.  The following changes have been made:  As above  Labs/ tests ordered today include:    Orders Placed This Encounter  Procedures  . Lipid panel  . Hepatic function panel  . EKG 12-Lead  .  VAS US CAROTID     Disposition:   See .me in one year.    Signed, Minus Breeding, MD  08/15/2020 4:18 PM    [facility]

## 2020-08-15 ENCOUNTER — Other Ambulatory Visit: Payer: Self-pay

## 2020-08-15 ENCOUNTER — Ambulatory Visit: Payer: Medicare Other | Admitting: Cardiology

## 2020-08-15 ENCOUNTER — Encounter: Payer: Self-pay | Admitting: Cardiology

## 2020-08-15 VITALS — BP 148/68 | HR 75 | Ht 62.0 in | Wt 125.6 lb

## 2020-08-15 DIAGNOSIS — I6523 Occlusion and stenosis of bilateral carotid arteries: Secondary | ICD-10-CM

## 2020-08-15 DIAGNOSIS — Z5181 Encounter for therapeutic drug level monitoring: Secondary | ICD-10-CM

## 2020-08-15 DIAGNOSIS — E785 Hyperlipidemia, unspecified: Secondary | ICD-10-CM | POA: Diagnosis not present

## 2020-08-15 DIAGNOSIS — E118 Type 2 diabetes mellitus with unspecified complications: Secondary | ICD-10-CM | POA: Diagnosis not present

## 2020-08-15 DIAGNOSIS — I2510 Atherosclerotic heart disease of native coronary artery without angina pectoris: Secondary | ICD-10-CM

## 2020-08-15 MED ORDER — ROSUVASTATIN CALCIUM 40 MG PO TABS: ORAL_TABLET | ORAL | 3 refills | Status: DC

## 2020-08-15 NOTE — Patient Instructions (Addendum)
Medication Instructions:  INCREASE YOUR ROSUVASTATIN TO 40 MG DAILY   *If you need a refill on your cardiac medications before your next appointment, please call your pharmacy*  Lab Work: FASTING LP/HFP IN 3 MONTHS   If you have labs (blood work) drawn today and your tests are completely normal, you will receive your results only by: [name] MyChart Message (if you have MyChart) OR . A paper copy in the mail If you have any lab test that is abnormal or we need to change your treatment, we will call you to review the results.  Testing/Procedures: Your physician has requested that you have a carotid duplex. This test is an ultrasound of the carotid arteries in your neck. It looks at blood flow through these arteries that supply the brain with blood. Allow one hour for this exam. There are no restrictions or special instructions. TO BE DONE IN MARCH 2023  Follow-Up: At [facility], you and your health needs are our priority.  As part of our continuing mission to provide you with exceptional heart care, we have created designated Provider Care Teams.  These Care Teams include your primary Cardiologist (physician) and Advanced Practice Providers (APPs -  Physician Assistants and Nurse Practitioners) who all work together to provide you with the care you need, when you need it.  We recommend signing up for the patient portal called "MyChart".  Sign up information is provided on this After Visit Summary.  MyChart is used to connect with patients for Virtual Visits (Telemedicine).  Patients are able to view lab/test results, encounter notes, upcoming appointments, etc.  Non-urgent messages can be sent to your provider as well.   To learn more about what you can do with MyChart, go to [email or web address].    Your next appointment:   12 month(s)  The format for your next appointment:   In Person  Provider:   You may see Minus Breeding, MD or one of the following Advanced Practice Providers on  your designated Care Team:    Rosaria Ferries, PA-C  Jory Sims, DNP, ANP

## 2020-08-30 ENCOUNTER — Ambulatory Visit: Payer: Medicare Other | Admitting: Cardiology

## 2020-09-16 ENCOUNTER — Other Ambulatory Visit: Payer: Self-pay | Admitting: Internal Medicine

## 2020-10-09 ENCOUNTER — Encounter: Payer: Self-pay | Admitting: Adult Health

## 2020-10-09 NOTE — Progress Notes (Signed)
3 MONTH FOLLOW UP  Assessment:     Essential hypertension Continue current medications Monitor blood pressure at home; call if consistently over 130/80 Continue DASH diet.   Reminder to go to the ER if any CP, SOB, nausea, dizziness, severe HA, changes vision/speech, left arm numbness and tingling and jaw pain.  Atherosclerosis of native coronary artery of native heart without angina pectoris Control blood pressure, cholesterol, glucose, increase exercise.   Diabetic polyneuropathy associated with type 2 diabetes mellitus (HCC) Intermittent burning in bilateral feet, none recent, declines medications Control sugars, check feet daily  Type 2 diabetes mellitus with stage 2 chronic kidney disease, without long-term current use of insulin (HCC) Continue medications: Metformin 538m 2 tab BID with meals., Jardiance 25 mg daily and glimepiride 440mBID.  A1C goal <7.5% for surgery Discussed general issues about diabetes pathophysiology and management., Educational material distributed., Suggested low cholesterol diet., Encouraged aerobic exercise., Discussed foot care., Reminded to get yearly retinal exam- report for this year in system -     Hemoglobin A1c  Hyperlipidemia associated with T2DM ([place] stat[place] for LDL goal <70, has increased dose  Consider zetia if needed Discussed dietary and exercise modifications Check levels q3 months  Vitamin D deficiency Continue vitamin D supplement 5,000 IU daily Check lab today  BMI 23.0-23.0 Contact office with any new or worsening symptoms    Future Appointments  Date Time Provider [place]3/09/2021 10:00 AM [facility] 1 [facility]  06/05/2021 10:00 AM [place], Ky[place]anton SewerNP [facility] None    Further disposition pending results if labs check today. Discussed med's effects and SE's.   Over 30 minutes of face to face interview, exam, counseling, chart review, and critical decision making was performed.      Subjective:  William Haney a [age].o. male who presents for 3 month follow up for HTN, hyperlipidemia, diabetes with CKD and vitamin D Def.    S/p left knee replacement with Dr. GrBerenice Primasnow have right knee pain and would like to have that knee replaced, working to improve A1C to <7.5% for this.    BMI is Body mass index is 23.23 kg/m., he has been working on diet and exercise. Gets on stationary cycle 20-25 min daily.  Wt Readings from Last 3 Encounters:  10/10/20 127 lb (57.6 kg)  08/15/20 125 lb 9.6 oz (57 kg)  07/20/20 126 lb 3.2 oz (57.2 kg)   He has a history of CAD s/p CABG in 2008, follows with Dr. HoPercival SpanishAlso follows for moderate bilateral carotid stenosis getting annual carotid [place] has a history of smoking, Q 2008, 35 pack year, last CXR 06/08/2020  His blood pressure has been controlled at home (120/80), today their BP is BP: 128/68 He does workout.  He denies chest pain, shortness of breath, dizziness.    He is on cholesterol medication (increased rosuvastatin from 20 mg to 40 mg at last visit with cardiology) and denies myalgias. His cholesterol is not at goal. The cholesterol last visit was:   Lab Results  Component Value Date   CHOL 154 06/09/2020   HDL 28 (L) 06/09/2020   LDLCALC 95 06/09/2020   TRIG 157 (H) 06/09/2020   CHOLHDL 5.5 06/09/2020   He has been working on diet and exercise for diabetes  with history of CAD/PAD, he is on 8157mSA With hyperlipidemia on crestor 20 at goal less than 70 With CKD he is on micardis Retinopathy - 05/10/2020 no retinopathy - has  had in the past  he is on metformin 2000 mg daily glimepiride 4 mg BID - may take only 1 tab if running low  Jardiance 25 mg daily He does check fasting glucose, running 110-120 or so. Checks prior to bedtime and has snack if <100 Has reported parasthesias in the past but none recently  Denies foot ulcerations, hyperglycemia, polydipsia, polyuria and visual disturbances.  Last A1C in the  office was:  Lab Results  Component Value Date   HGBA1C 8.3 (H) 08/09/2020   Lab Results  Component Value Date   GFRNONAA 64 08/09/2020   Patient is on Vitamin D supplement, taking 5000 IU Lab Results  Component Value Date   VD25OH 38 09/07/2019        Medication Review:  Current Outpatient Medications (Endocrine & Metabolic):    empagliflozin (JARDIANCE) 25 MG TABS tablet, Take 1 tablet (25 mg total) by mouth daily.   glimepiride (AMARYL) 4 MG tablet, Take  1 tablet  2 x /day  with Meals for Diabetes  / Patient knows to take by mouth   metFORMIN (GLUCOPHAGE) 500 MG tablet, TAKE 2 TABLETS TWICE A DAY WITH MEALS FOR DIABETES (Patient taking differently: Take 1,000 mg by mouth 2 (two) times daily with a meal.)  Current Outpatient Medications (Cardiovascular):    metoprolol tartrate (LOPRESSOR) 25 MG tablet, Take      1 tablet       2 x /day (every 12 hours)        for BP (Patient taking differently: Take 25 mg by mouth 2 (two) times daily.)   rosuvastatin (CRESTOR) 40 MG tablet, TAKE 1 TABLET BY MOUTH EVERY DAY FOR CHOLESTEROL   telmisartan (MICARDIS) 20 MG tablet, TAKE 1 TABLET DAILY FOR BP & KIDNEY PROTECTION (Patient taking differently: Take 20 mg by mouth daily.)   Current Outpatient Medications (Analgesics):    aspirin EC 81 MG tablet, Take 81 mg by mouth daily. Swallow whole.   Current Outpatient Medications (Other):    blood glucose meter kit and supplies, Test sugars once to twice daily   Cholecalciferol (VITAMIN D3) 125 MCG (5000 UT) CAPS, Take 5,000 Units by mouth daily.   lactulose (CHRONULAC) 10 GM/15ML solution, Take 30 mLs (20 g total) by mouth 2 (two) times daily.   Multiple Vitamin (MULTIVITAMIN) tablet, Take 1 tablet by mouth daily.  Current Problems (verified) Patient Active Problem List   Diagnosis Date Noted   Coronary artery disease involving native coronary artery of native heart without angina pectoris 08/14/2020   CKD stage 2 due to type 2 diabetes  mellitus ([place]) 02/11/2020   Primary osteoarthritis of left knee 12/18/2018   beta thalassemia minor (trait) anemia 03/08/2018   Overweight (BMI 25.0-29.9) 08/10/2017   Diabetic retinopathy of both eyes ([place]) 08/10/2017   Poor compliance 09/09/2015   DM type 2 (diabetes mellitus, type 2) ([place]) 09/08/2015   Neuropathy, diabetic ([place]) 05/19/2015   Essential hypertension 10/11/2013   Vitamin D deficiency 10/11/2013   Medication management 10/11/2013   Hyperlipidemia associated with type 2 diabetes mellitus ([place]) 03/01/2010   Carotid stenosis 03/01/2010   Allergies Allergies  Allergen Reactions   Ace Inhibitors Cough   Statins Other (See Comments)    Elevated LFT's     SURGICAL HISTORY He  has a past surgical history that includes Coronary artery bypass graft (2008) and Total knee arthroplasty (Left, 12/18/2018). FAMILY HISTORY His family history includes Diabetes in his sister; Heart attack in his father; Heart disease in his brother  and father; Hyperlipidemia in his father; Hypertension in his father. SOCIAL HISTORY He  reports that he quit smoking about 15 years ago. He started smoking about 49 years ago. He has a 34.00 pack-year smoking history. He has never used smokeless tobacco. He reports that he does not drink alcohol and does not use drugs.   Review of Systems  Constitutional:  Negative for malaise/fatigue and weight loss.  HENT:  Negative for hearing loss and tinnitus.   Eyes:  Negative for blurred vision and double vision.  Respiratory:  Negative for cough, sputum production, shortness of breath and wheezing.   Cardiovascular:  Negative for chest pain, palpitations, orthopnea, claudication, leg swelling and PND.  Gastrointestinal:  Negative for abdominal pain, blood in stool, constipation, diarrhea, heartburn, melena, nausea and vomiting.  Genitourinary: Negative.   Musculoskeletal:  Positive for back pain (intermittent bil lumbar aching, not midline, non-radiating) and  joint pain (R knee). Negative for falls and myalgias.  Skin:  Negative for rash.  Neurological:  Negative for dizziness, tingling, sensory change, weakness and headaches.  Endo/Heme/Allergies:  Negative for polydipsia.  Psychiatric/Behavioral: Negative.  Negative for depression, memory loss, substance abuse and suicidal ideas. The patient is not nervous/anxious and does not have insomnia.   All other systems reviewed and are negative.   Objective:   Blood pressure 128/68, pulse 71, temperature 97.7 F (36.5 C), weight 127 lb (57.6 kg), SpO2 99 %. Body mass index is 23.23 kg/m.  General appearance: alert, no distress, WD/WN, male HEENT: normocephalic, sclerae anicteric, TMs pearly, nares patent, no discharge or erythema, pharynx normal Oral cavity: MMM, no lesions Neck: supple, no lymphadenopathy, no thyromegaly, no masses Heart: RRR, normal S1, S2, no murmurs Lungs: CTA bilaterally, no wheezes, rhonchi, or rales Abdomen: +bs, soft, non tender, non distended, no masses, no hepatomegaly, no splenomegaly Musculoskeletal: nontender, no swelling, varus deformity, antalgic gait, well healing vertical left knee scar , + crepitus right knee.  Extremities: no edema, no cyanosis, no clubbing Pulses: 2+ symmetric, upper and lower extremities, normal cap refill Neurological: alert, oriented x 3, CN2-12 intact, strength normal upper extremities and lower extremities, sensation abnormal bilateral feet, DTRs 2+ throughout, no cerebellar signs Psychiatric: normal affect, behavior normal, pleasant   Izora Ribas, NP 9:57 AM [facility]

## 2020-10-10 ENCOUNTER — Other Ambulatory Visit: Payer: Self-pay

## 2020-10-10 ENCOUNTER — Ambulatory Visit (INDEPENDENT_AMBULATORY_CARE_PROVIDER_SITE_OTHER): Payer: Medicare Other | Admitting: Adult Health

## 2020-10-10 ENCOUNTER — Encounter: Payer: Self-pay | Admitting: Adult Health

## 2020-10-10 VITALS — BP 128/68 | HR 71 | Temp 97.7°F | Wt 127.0 lb

## 2020-10-10 DIAGNOSIS — Z79899 Other long term (current) drug therapy: Secondary | ICD-10-CM | POA: Diagnosis not present

## 2020-10-10 DIAGNOSIS — Z9119 Patient's noncompliance with other medical treatment and regimen: Secondary | ICD-10-CM | POA: Diagnosis not present

## 2020-10-10 DIAGNOSIS — E559 Vitamin D deficiency, unspecified: Secondary | ICD-10-CM

## 2020-10-10 DIAGNOSIS — E1169 Type 2 diabetes mellitus with other specified complication: Secondary | ICD-10-CM

## 2020-10-10 DIAGNOSIS — N182 Chronic kidney disease, stage 2 (mild): Secondary | ICD-10-CM

## 2020-10-10 DIAGNOSIS — I2510 Atherosclerotic heart disease of native coronary artery without angina pectoris: Secondary | ICD-10-CM

## 2020-10-10 DIAGNOSIS — E785 Hyperlipidemia, unspecified: Secondary | ICD-10-CM

## 2020-10-10 DIAGNOSIS — E113293 Type 2 diabetes mellitus with mild nonproliferative diabetic retinopathy without macular edema, bilateral: Secondary | ICD-10-CM | POA: Diagnosis not present

## 2020-10-10 DIAGNOSIS — E1142 Type 2 diabetes mellitus with diabetic polyneuropathy: Secondary | ICD-10-CM

## 2020-10-10 DIAGNOSIS — E1122 Type 2 diabetes mellitus with diabetic chronic kidney disease: Secondary | ICD-10-CM | POA: Diagnosis not present

## 2020-10-10 DIAGNOSIS — E663 Overweight: Secondary | ICD-10-CM

## 2020-10-10 DIAGNOSIS — I10 Essential (primary) hypertension: Secondary | ICD-10-CM | POA: Diagnosis not present

## 2020-10-10 DIAGNOSIS — Z91199 Patient's noncompliance with other medical treatment and regimen due to unspecified reason: Secondary | ICD-10-CM

## 2020-10-11 ENCOUNTER — Other Ambulatory Visit: Payer: Self-pay | Admitting: Adult Health

## 2020-10-11 DIAGNOSIS — E875 Hyperkalemia: Secondary | ICD-10-CM

## 2020-10-11 LAB — COMPLETE METABOLIC PANEL WITH GFR
AG Ratio: 1.7 (calc) (ref 1.0–2.5)
ALT: 27 U/L (ref 9–46)
AST: 22 U/L (ref 10–35)
Albumin: 4.2 g/dL (ref 3.6–5.1)
Alkaline phosphatase (APISO): 55 U/L (ref 35–144)
BUN: 14 mg/dL (ref 7–25)
CO2: 28 mmol/L (ref 20–32)
Calcium: 9.8 mg/dL (ref 8.6–10.3)
Chloride: 103 mmol/L (ref 98–110)
Creat: 1.11 mg/dL (ref 0.70–1.28)
Globulin: 2.5 g/dL (calc) (ref 1.9–3.7)
Glucose, Bld: 135 mg/dL — ABNORMAL HIGH (ref 65–99)
Potassium: 5.4 mmol/L — ABNORMAL HIGH (ref 3.5–5.3)
Sodium: 138 mmol/L (ref 135–146)
Total Bilirubin: 0.6 mg/dL (ref 0.2–1.2)
Total Protein: 6.7 g/dL (ref 6.1–8.1)
eGFR: 71 mL/min/{1.73_m2} (ref >=60)

## 2020-10-11 LAB — CBC WITH DIFFERENTIAL/PLATELET
Absolute Monocytes: 603 cells/uL (ref 200–950)
Basophils Absolute: 81 cells/uL (ref 0–200)
Basophils Relative: 1.4 %
Eosinophils Absolute: 342 cells/uL (ref 15–500)
Eosinophils Relative: 5.9 %
HCT: 40.5 % (ref 38.5–50.0)
Hemoglobin: 11.6 g/dL — ABNORMAL LOW (ref 13.2–17.1)
Lymphs Abs: 1549 cells/uL (ref 850–3900)
MCH: 19.8 pg — ABNORMAL LOW (ref 27.0–33.0)
MCHC: 28.6 g/dL — ABNORMAL LOW (ref 32.0–36.0)
MCV: 69.2 fL — ABNORMAL LOW (ref 80.0–100.0)
Monocytes Relative: 10.4 %
Neutro Abs: 3225 cells/uL (ref 1500–7800)
Neutrophils Relative %: 55.6 %
Platelets: 314 10*3/uL (ref 140–400)
RBC: 5.85 10*6/uL — ABNORMAL HIGH (ref 4.20–5.80)
RDW: 17.8 % — ABNORMAL HIGH (ref 11.0–15.0)
Total Lymphocyte: 26.7 %
WBC: 5.8 10*3/uL (ref 3.8–10.8)

## 2020-10-11 LAB — MAGNESIUM: Magnesium: 2 mg/dL (ref 1.5–2.5)

## 2020-10-11 LAB — HEMOGLOBIN A1C
Hgb A1c MFr Bld: 8.3 % of total Hgb — ABNORMAL HIGH (ref <5.7)
Mean Plasma Glucose: 192 mg/dL
eAG (mmol/L): 10.6 mmol/L

## 2020-10-11 LAB — LIPID PANEL
Cholesterol: 101 mg/dL (ref <200)
HDL: 41 mg/dL (ref >=40)
LDL Cholesterol (Calc): 43 mg/dL (calc)
Non-HDL Cholesterol (Calc): 60 mg/dL (calc) (ref <130)
Total CHOL/HDL Ratio: 2.5 (calc) (ref <5.0)
Triglycerides: 83 mg/dL (ref <150)

## 2020-10-11 LAB — TSH: TSH: 3.3 mIU/L (ref 0.40–4.50)

## 2020-10-11 LAB — VITAMIN D 25 HYDROXY (VIT D DEFICIENCY, FRACTURES): Vit D, 25-Hydroxy: 65 ng/mL (ref 30–100)

## 2020-12-02 ENCOUNTER — Other Ambulatory Visit: Payer: Self-pay | Admitting: Internal Medicine

## 2021-01-08 NOTE — Progress Notes (Signed)
MEDICARE WELLNESS AND FOLLOW UP Assessment:    Essential hypertension - continue medications, DASH diet, exercise and monitor at home. Call if greater than 130/80.  -     CBC with Differential/Platelet -     CMP/GFR -     TSH  Atherosclerosis of native coronary artery of native heart without angina pectoris Control blood pressure, cholesterol, glucose, increase exercise.  -     Lipid panel -     Hemoglobin A1c  Stenosis of carotid artery, unspecified laterality Control blood pressure, cholesterol, glucose, increase exercise.  Followed by cardiology; moderate disease -     Lipid panel -     Hemoglobin A1c  Type 2 diabetes mellitus with other circulatory complication, without long-term current use of insulin (HCC) On metformin, glimepiride,  jardiance Discussed general issues about diabetes pathophysiology and management., Educational material distributed., Suggested low cholesterol diet., Encouraged aerobic exercise., Discussed foot care., Reminded to get yearly retinal exam. -     Hemoglobin A1c  Diabetic polyneuropathy associated with type 2 diabetes mellitus (HCC) Intermittent burning in bilateral feet, none recent, declines medications Control sugars, check feet daily -     Hemoglobin A1c  Type 2 diabetes mellitus with stage 2 chronic kidney disease, without long-term current use of insulin ([place]) Discussed general issues about diabetes pathophysiology and management., Educational material distributed., Suggested low cholesterol diet., Encouraged aerobic exercise., Discussed foot care., Reminded to get yearly retinal exam- report for this year in system -     Hemoglobin A1c  Type 2 diabetes mellitus with diabetic retinopathy Control sugars, followed by Ophthalmology  Hyperlipidemia -continue medications, check lipids, decrease fatty foods, increase activity.  -     Lipid panel  Anemia, beta thalassemia minor trait -     CBC with Differential/Platelet  Medication  management -     Magnesium  Flu Vaccine Need High Dose Flu Vaccine Given  Vitamin D deficiency Continue vitamin D supplement  Former smoker, 34 pack year history, quit 2007 -CXR 06/08/20- will repeat in 1 year  Future Appointments  Date Time Provider [place]  05/15/2021 10:00 AM [facility] VASC 1 [facility]  06/05/2021 10:00 AM Magda Bernheim, NP [facility] None  01/10/2022 11:00 AM Magda Bernheim, NP [facility] None    Plan:   During the course of the visit the patient was educated and counseled about appropriate screening and preventive services including:   Pneumococcal vaccine  Prevnar 13 Influenza vaccine Td vaccine Screening electrocardiogram Bone densitometry screening Colorectal cancer screening Diabetes screening Glaucoma screening Nutrition counseling  Advanced directives: requested   Subjective:  William Haney is a 70 y.o. male who presents for medicare wellness and 3 month follow up for HTN, hyperlipidemia, diabetes, and vitamin D Def.    Right knee continues to cause pain, is to have replacement in the near future- trying to get A1c down.   BMI is Body mass index is 24.07 kg/m., he has been working on diet and exercise. Gets on stationary cycle 20-25 min daily.  Wt Readings from Last 3 Encounters:  01/10/21 131 lb 9.6 oz (59.7 kg)  10/10/20 127 lb (57.6 kg)  08/15/20 125 lb 9.6 oz (57 kg)   He has a history of CAD s/p CABG in 2008, follows with Dr. Percival Spanish. Lab Results  Component Value Date   IRON 62 12/10/2019   TIBC 365 12/10/2019   FERRITIN 38 02/26/2018   Lab Results  Component Value Date   VITAMINB12 334 06/08/2020    Also  follows for moderate bilateral carotid stenosis getting annual carotid US He has a history of smoking, Q 2008, 35 pack year, last CXR 12/2018 before surgery.  His blood pressure has been controlled at home (120/80), today their BP is BP: 130/60 He does workout.  He denies chest pain, shortness of breath, dizziness.    He has been working on diet and exercise for diabetes  with history of CAD/PAD ,he is on 69m ASA With hyperlipidemia on crestor 20 at goal less than 70 With CKD he is on micardis Retinopathy - 2022 he is on metformin 2000 mg daily glimepiride 4 mg only on 1 a day  jardiance 10 mg daily He does check fasting glucose, has been better and running 120-130 or so. HBartholome Billreported parasthesias in the past but none recently  Denies foot ulcerations, hyperglycemia, polydipsia, polyuria and visual disturbances.  Last A1C in the office was:  Lab Results  Component Value Date   HGBA1C 8.3 (H) 10/10/2020   Lab Results  Component Value Date   GFRNONAA 64 08/09/2020   Patient is on Vitamin D supplement.   Lab Results  Component Value Date   VD25OH 65 10/10/2020        Medication Review:  Current Outpatient Medications (Endocrine & Metabolic):    empagliflozin (JARDIANCE) 25 MG TABS tablet, Take 1 tablet (25 mg total) by mouth daily.   glimepiride (AMARYL) 4 MG tablet, Take  1 tablet  2 x /day  with Meals for Diabetes   metFORMIN (GLUCOPHAGE) 500 MG tablet, TAKE 2 TABLETS TWICE A DAY WITH MEALS FOR DIABETES (Patient taking differently: Take 1,000 mg by mouth 2 (two) times daily with a meal.)  Current Outpatient Medications (Cardiovascular):    metoprolol tartrate (LOPRESSOR) 25 MG tablet, Take      1 tablet       2 x /day (every 12 hours)        for BP (Patient taking differently: Take 25 mg by mouth 2 (two) times daily.)   rosuvastatin (CRESTOR) 40 MG tablet, TAKE 1 TABLET BY MOUTH EVERY DAY FOR CHOLESTEROL   telmisartan (MICARDIS) 20 MG tablet, TAKE 1 TABLET DAILY FOR BP & KIDNEY PROTECTION (Patient taking differently: Take 20 mg by mouth daily.)   Current Outpatient Medications (Analgesics):    aspirin EC 81 MG tablet, Take 81 mg by mouth daily. Swallow whole.   Current Outpatient Medications (Other):    blood glucose meter kit and supplies, Test sugars once to twice daily    Cholecalciferol (VITAMIN D3) 125 MCG (5000 UT) CAPS, Take 5,000 Units by mouth daily.   co-enzyme Q-10 30 MG capsule, Take 30 mg by mouth 3 (three) times daily.   Multiple Vitamin (MULTIVITAMIN) tablet, Take 1 tablet by mouth daily.   lactulose (CHRONULAC) 10 GM/15ML solution, Take 30 mLs (20 g total) by mouth 2 (two) times daily.  Current Problems (verified) Patient Active Problem List   Diagnosis Date Noted   Coronary artery disease involving native coronary artery of native heart without angina pectoris 08/14/2020   CKD stage 2 due to type 2 diabetes mellitus ([place] 02/11/2020   Primary osteoarthritis of left knee 12/18/2018   beta thalassemia minor (trait) anemia 03/08/2018   Overweight (BMI 25.0-29.9) 08/10/2017   Poor compliance 09/09/2015   DM type 2 (diabetes mellitus, type 2) ([place] 09/08/2015   Neuropathy, diabetic ([place] 05/19/2015   Essential hypertension 10/11/2013   Vitamin D deficiency 10/11/2013   Medication management 10/11/2013   Hyperlipidemia associated with type 2  diabetes mellitus ([place]) 03/01/2010   Carotid stenosis 03/01/2010   Allergies Allergies  Allergen Reactions   Ace Inhibitors Cough   Statins Other (See Comments)    Elevated LFT's    SURGICAL HISTORY He  has a past surgical history that includes Coronary artery bypass graft (2008) and Total knee arthroplasty (Left, 12/18/2018). FAMILY HISTORY His family history includes Diabetes in his sister; Heart attack in his father; Heart disease in his brother and father; Hyperlipidemia in his father; Hypertension in his father. SOCIAL HISTORY He  reports that he quit smoking about 15 years ago. His smoking use included cigarettes. He started smoking about 49 years ago. He has a 34.00 pack-year smoking history. He has never used smokeless tobacco. He reports that he does not drink alcohol and does not use drugs.   MEDICARE WELLNESS OBJECTIVES: Physical activity:   Cardiac risk factors:   Depression/mood screen:    Depression screen [facility] 2/9 06/12/2020  Decreased Interest 0  Down, Depressed, Hopeless 0  PHQ - 2 Score 0    ADLs:  In your present state of health, do you have any difficulty performing the following activities: 06/12/2020 06/10/2020  Hearing? N -  Vision? N -  Difficulty concentrating or making decisions? N -  Walking or climbing stairs? N -  Dressing or bathing? N -  Doing errands, shopping? N N  Some recent data might be hidden     Cognitive Testing  Alert? Yes  Normal Appearance?Yes  Oriented to person? Yes  Place? Yes   Time? Yes  Recall of three objects?  Yes  Can perform simple calculations? Yes  Displays appropriate judgment?Yes  Can read the correct time from a watch face?Yes  EOL planning:     Screening Tests Immunization History  Administered Date(s) Administered   DT (Pediatric) 02/07/2015   Influenza Whole 12/10/2012   Influenza, High Dose Seasonal PF 02/07/2015, 01/02/2016, 12/13/2016, 01/01/2018, 11/19/2018, 02/21/2020   Influenza-Unspecified 12/31/2013   Meningococcal Conjugate 09/03/2016   PFIZER(Purple Top)SARS-COV-2 Vaccination 04/02/2019, 04/23/2019   Pneumococcal Conjugate-13 02/02/2014   Pneumococcal Polysaccharide-23 04/13/2003, 10/08/2016   Td 04/18/2004   Health Maintenance  Topic Date Due   Zoster Vaccines- Shingrix (1 of 2) Never done   COVID-19 Vaccine (3 - Pfizer risk series) 05/21/2019   FOOT EXAM  11/19/2019   INFLUENZA VACCINE  10/09/2020   HEMOGLOBIN A1C  04/12/2021   OPHTHALMOLOGY EXAM  05/10/2021   COLONOSCOPY (Pts 45-28yr Insurance coverage will need to be confirmed)  08/22/2022   TETANUS/TDAP  02/06/2025   Pneumonia Vaccine 70 Years old  Completed   Hepatitis C Screening  Completed   HPV VACCINES  Aged Out   DEXA: declines Colonoscopy: 2014 due 2024  CXr 12/2018 has 34 pack year smoking hx, quit in 2007   EGD:N/A Echo 01/2008 Stress test 2013 MRI head 03/2007 Carotid [place] 08/14/2018 - by Dr. HPercival Spanish  Names of Other  Physician/Practitioners you currently use: 1. [facility] here for primary care 2. Dr. SEllwood Sayers his son 2022 3. Dr. CBurt Knack dentist, 2021- has full dentures  Patient Care Team: MUnk Pinto MD as PCP - General (Internal Medicine) HMinus Breeding MD as PCP - Cardiology (Cardiology) WKeene Breath, MD as Consulting Physician (Ophthalmology) HMinus Breeding MD as Consulting Physician (Cardiology) MClarene Essex MD as Consulting Physician (Gastroenterology) GDorna Leitz MD as Consulting Physician (Orthopedic Surgery) .  Review of Systems  Constitutional:  Negative for chills, fever and weight loss.  HENT:  Negative for congestion  and hearing loss.   Eyes:  Negative for blurred vision and double vision.  Respiratory:  Negative for cough and shortness of breath.   Cardiovascular:  Negative for chest pain, palpitations, orthopnea and leg swelling.  Gastrointestinal:  Negative for abdominal pain, constipation, diarrhea, heartburn, nausea and vomiting.  Musculoskeletal:  Positive for joint pain (right need). Negative for falls and myalgias.  Skin:  Negative for rash.  Neurological:  Negative for dizziness, tingling, tremors, loss of consciousness and headaches.  Psychiatric/Behavioral:  Negative for depression, memory loss and suicidal ideas.    Objective:   Blood pressure 130/60, pulse 78, temperature (!) 97.5 F (36.4 C), weight 131 lb 9.6 oz (59.7 kg), SpO2 98 %. Body mass index is 24.07 kg/m.  General appearance: alert, no distress, WD/WN, male HEENT: normocephalic, sclerae anicteric, TMs pearly, nares patent, no discharge or erythema, pharynx normal Oral cavity: MMM, no lesions Neck: supple, no lymphadenopathy, no thyromegaly, no masses Heart: RRR, normal S1, S2, no murmurs Lungs: CTA bilaterally, no wheezes, rhonchi, or rales Abdomen: +bs, soft, non tender, non distended, no masses, no hepatomegaly, no splenomegaly Musculoskeletal:  nontender, no swelling, varus deformity, antalgic gait, well healing vertical left knee scar , + crepitus right knee.  Extremities: no edema, no cyanosis, no clubbing Pulses: 2+ symmetric, upper and lower extremities, normal cap refill Neurological: alert, oriented x 3, CN2-12 intact, strength normal upper extremities and lower extremities, sensation abnormal bilateral feet to monofilament, DTRs 2+ throughout, no cerebellar signs Psychiatric: normal affect, behavior normal, pleasant   Medicare Attestation I have personally reviewed: The patient's medical and social history Their use of alcohol, tobacco or illicit drugs Their current medications and supplements The patient's functional ability including ADLs,fall risks, home safety risks, cognitive, and hearing and visual impairment Diet and physical activities Evidence for depression or mood disorders  The patient's weight, height, BMI, and visual acuity have been recorded in the chart.  I have made referrals, counseling, and provided education to the patient based on review of the above and I have provided the patient with a written personalized care plan for preventive services.    Magda Bernheim ANP-C  [facility]  01/10/2021

## 2021-01-10 ENCOUNTER — Other Ambulatory Visit: Payer: Self-pay

## 2021-01-10 ENCOUNTER — Ambulatory Visit (INDEPENDENT_AMBULATORY_CARE_PROVIDER_SITE_OTHER): Payer: Medicare Other | Admitting: Nurse Practitioner

## 2021-01-10 ENCOUNTER — Encounter: Payer: Self-pay | Admitting: Nurse Practitioner

## 2021-01-10 VITALS — BP 130/60 | HR 78 | Temp 97.5°F | Wt 131.6 lb

## 2021-01-10 DIAGNOSIS — I6529 Occlusion and stenosis of unspecified carotid artery: Secondary | ICD-10-CM | POA: Diagnosis not present

## 2021-01-10 DIAGNOSIS — Z87891 Personal history of nicotine dependence: Secondary | ICD-10-CM

## 2021-01-10 DIAGNOSIS — E559 Vitamin D deficiency, unspecified: Secondary | ICD-10-CM | POA: Diagnosis not present

## 2021-01-10 DIAGNOSIS — R6889 Other general symptoms and signs: Secondary | ICD-10-CM | POA: Diagnosis not present

## 2021-01-10 DIAGNOSIS — D509 Iron deficiency anemia, unspecified: Secondary | ICD-10-CM | POA: Diagnosis not present

## 2021-01-10 DIAGNOSIS — E785 Hyperlipidemia, unspecified: Secondary | ICD-10-CM

## 2021-01-10 DIAGNOSIS — E1122 Type 2 diabetes mellitus with diabetic chronic kidney disease: Secondary | ICD-10-CM | POA: Diagnosis not present

## 2021-01-10 DIAGNOSIS — I10 Essential (primary) hypertension: Secondary | ICD-10-CM

## 2021-01-10 DIAGNOSIS — E1169 Type 2 diabetes mellitus with other specified complication: Secondary | ICD-10-CM

## 2021-01-10 DIAGNOSIS — I2510 Atherosclerotic heart disease of native coronary artery without angina pectoris: Secondary | ICD-10-CM | POA: Diagnosis not present

## 2021-01-10 DIAGNOSIS — E113293 Type 2 diabetes mellitus with mild nonproliferative diabetic retinopathy without macular edema, bilateral: Secondary | ICD-10-CM

## 2021-01-10 DIAGNOSIS — Z79899 Other long term (current) drug therapy: Secondary | ICD-10-CM

## 2021-01-10 DIAGNOSIS — N182 Chronic kidney disease, stage 2 (mild): Secondary | ICD-10-CM | POA: Diagnosis not present

## 2021-01-10 DIAGNOSIS — Z0001 Encounter for general adult medical examination with abnormal findings: Secondary | ICD-10-CM | POA: Diagnosis not present

## 2021-01-10 DIAGNOSIS — E1142 Type 2 diabetes mellitus with diabetic polyneuropathy: Secondary | ICD-10-CM

## 2021-01-10 DIAGNOSIS — Z23 Encounter for immunization: Secondary | ICD-10-CM | POA: Diagnosis not present

## 2021-01-10 DIAGNOSIS — E663 Overweight: Secondary | ICD-10-CM

## 2021-01-10 NOTE — Patient Instructions (Signed)

## 2021-01-11 LAB — CBC WITH DIFFERENTIAL/PLATELET
Absolute Monocytes: 608 cells/uL (ref 200–950)
Basophils Absolute: 84 cells/uL (ref 0–200)
Basophils Relative: 1.1 %
Eosinophils Absolute: 555 cells/uL — ABNORMAL HIGH (ref 15–500)
Eosinophils Relative: 7.3 %
HCT: 43.2 % (ref 38.5–50.0)
Hemoglobin: 12.7 g/dL — ABNORMAL LOW (ref 13.2–17.1)
Lymphs Abs: 1763 cells/uL (ref 850–3900)
MCH: 20.1 pg — ABNORMAL LOW (ref 27.0–33.0)
MCHC: 29.4 g/dL — ABNORMAL LOW (ref 32.0–36.0)
MCV: 68.5 fL — ABNORMAL LOW (ref 80.0–100.0)
MPV: 11.5 fL (ref 7.5–12.5)
Monocytes Relative: 8 %
Neutro Abs: 4590 cells/uL (ref 1500–7800)
Neutrophils Relative %: 60.4 %
Platelets: 319 10*3/uL (ref 140–400)
RBC: 6.31 10*6/uL — ABNORMAL HIGH (ref 4.20–5.80)
RDW: 17.2 % — ABNORMAL HIGH (ref 11.0–15.0)
Total Lymphocyte: 23.2 %
WBC: 7.6 10*3/uL (ref 3.8–10.8)

## 2021-01-11 LAB — COMPLETE METABOLIC PANEL WITH GFR
AG Ratio: 1.6 (calc) (ref 1.0–2.5)
ALT: 22 U/L (ref 9–46)
AST: 18 U/L (ref 10–35)
Albumin: 4.2 g/dL (ref 3.6–5.1)
Alkaline phosphatase (APISO): 53 U/L (ref 35–144)
BUN: 19 mg/dL (ref 7–25)
CO2: 28 mmol/L (ref 20–32)
Calcium: 9.4 mg/dL (ref 8.6–10.3)
Chloride: 102 mmol/L (ref 98–110)
Creat: 1.13 mg/dL (ref 0.70–1.28)
Globulin: 2.6 g/dL (calc) (ref 1.9–3.7)
Glucose, Bld: 169 mg/dL — ABNORMAL HIGH (ref 65–99)
Potassium: 5.2 mmol/L (ref 3.5–5.3)
Sodium: 135 mmol/L (ref 135–146)
Total Bilirubin: 0.6 mg/dL (ref 0.2–1.2)
Total Protein: 6.8 g/dL (ref 6.1–8.1)
eGFR: 69 mL/min/{1.73_m2} (ref >=60)

## 2021-01-11 LAB — LIPID PANEL
Cholesterol: 101 mg/dL (ref <200)
HDL: 36 mg/dL — ABNORMAL LOW (ref >=40)
LDL Cholesterol (Calc): 47 mg/dL (calc)
Non-HDL Cholesterol (Calc): 65 mg/dL (calc) (ref <130)
Total CHOL/HDL Ratio: 2.8 (calc) (ref <5.0)
Triglycerides: 93 mg/dL (ref <150)

## 2021-01-11 LAB — CBC MORPHOLOGY

## 2021-01-11 LAB — HEMOGLOBIN A1C
Hgb A1c MFr Bld: 9.4 % of total Hgb — ABNORMAL HIGH (ref <5.7)
Mean Plasma Glucose: 223 mg/dL
eAG (mmol/L): 12.4 mmol/L

## 2021-01-25 NOTE — Progress Notes (Signed)
DIABETES FOLLOW UP  Assessment:    Essential hypertension Continue current medications Monitor blood pressure at home; call if consistently over 130/80 Continue DASH diet.   Reminder to go to the ER if any CP, SOB, nausea, dizziness, severe HA, changes vision/speech, left arm numbness and tingling and jaw pain.  Atherosclerosis of native coronary artery of native heart without angina pectoris Control blood pressure, cholesterol, glucose, increase exercise.   Diabetic polyneuropathy associated with type 2 diabetes mellitus (HCC) Intermittent burning in bilateral feet, none recent, declines medications Control sugars, check feet daily   Type 2 diabetes mellitus with stage 2 chronic kidney disease, without long-term current use of insulin (HCC) Continue medications: Metformin 519m 2 tab BID with meals., Jardiance 25 mg daily and glimepiride 471mBID.  Due to hypoglycemia and inadequate control stop glimepiride and start trulicity after discussion Given 1 month free coupon for 0.75 mg weekly x 4 weeks with close follow up Keep glucose and food intake log A1C goal <7.5% for surgery  Discussed general issues about diabetes pathophysiology and management., Educational material distributed., Suggested low cholesterol diet., Encouraged aerobic exercise., Discussed foot care., Reminded to get yearly retinal exam- report for this year in system  Hyperlipidemia associated with T2DM ([place]Continue statin for LDL goal <70  CKD 2 associated with T2DM (HCK[place] WestContinue telmisartan and jardiance; push water intake; control sugars  BMI 24 Continue to recommend diet heavy in fruits and veggies and low in animal meats, cheeses, and dairy products, appropriate calorie intake Discuss exercise recommendations routinely Continue to monitor weight at each visit    Future Appointments  Date Time Provider [place]12/22/2022 10:30 AM CoLiane ComberNP [facility] None  05/15/2021 10:00 AM [facility]  NL VASC 1 [facility]  06/05/2021 10:00 AM MuMagda BernheimNP [facility] None  01/10/2022 11:00 AM MuMagda BernheimNP [facility] None    Further disposition pending results if labs check today. Discussed med's effects and SE's.   Over 30 minutes of face to face interview, exam, counseling, chart review, and critical decision making was performed.     Subjective:  William Haney a [age].o. male who presents for 6 week follow up for HTN, hyperlipidemia, diabetes with CKD.   S/p left knee replacement with Dr. GrBerenice Primasnow have right knee pain and would like to have that knee replaced, working to improve A1C to <7.5% for this, close follow up to help achieve this goal.    BMI is Body mass index is 24.14 kg/m., he has been working on diet and exercise. Gets on stationary cycle 20-25 min daily.  Wt Readings from Last 3 Encounters:  01/30/21 132 lb (59.9 kg)  01/10/21 131 lb 9.6 oz (59.7 kg)  10/10/20 127 lb (57.6 kg)   His blood pressure has been controlled at home (120/80), today their BP is BP: 102/60 He does workout.  He denies chest pain, shortness of breath, dizziness.    He is on cholesterol medication (rosuvastatin 40 mg daily) and denies myalgias. His cholesterol is at goal. The cholesterol last visit was:   Lab Results  Component Value Date   CHOL 101 01/10/2021   HDL 36 (L) 01/10/2021   LDLCALC 47 01/10/2021   TRIG 93 01/10/2021   CHOLHDL 2.8 01/10/2021   He has been working on diet and exercise for diabetes  with history of CAD/PAD, he is on 8113mSA With hyperlipidemia on crestor 40 at goal less than 70 With CKD he is on telmisartan  Retinopathy - 05/10/2020 no retinopathy - has had in the past  he is on metformin 2000 mg daily glimepiride 4 mg BID - may take only 1 tab if running low - still reports having several hypoglycemia episodes Jardiance 25 mg daily - [facility] cares He has never tried [place] He does check fasting glucose, running 150 or so, forgot log  today. Checks prior to bedtime and has snack if <100 Has reported parasthesias in the past but none recently ,has had foot exam this year  Denies foot ulcerations, hyperglycemia, polydipsia, polyuria and visual disturbances.  Last A1C in the office was:  Lab Results  Component Value Date   HGBA1C 9.4 (H) 01/10/2021   CKD II range, on telmisartan Lab Results  Component Value Date   GFRNONAA 64 08/09/2020     Medication Review:  Current Outpatient Medications (Endocrine & Metabolic):    Dulaglutide (TRULICITY) 0.09 QZ/3.0QT SOPN, Inject 0.75 mg into the skin once a week.   empagliflozin (JARDIANCE) 25 MG TABS tablet, Take 1 tablet (25 mg total) by mouth daily.   metFORMIN (GLUCOPHAGE) 500 MG tablet, TAKE 2 TABLETS TWICE A DAY WITH MEALS FOR DIABETES (Patient taking differently: Take 1,000 mg by mouth 2 (two) times daily with a meal.)  Current Outpatient Medications (Cardiovascular):    metoprolol tartrate (LOPRESSOR) 25 MG tablet, Take      1 tablet       2 x /day (every 12 hours)        for BP (Patient taking differently: Take 25 mg by mouth 2 (two) times daily.)   rosuvastatin (CRESTOR) 40 MG tablet, TAKE 1 TABLET BY MOUTH EVERY DAY FOR CHOLESTEROL   telmisartan (MICARDIS) 20 MG tablet, TAKE 1 TABLET DAILY FOR BP & KIDNEY PROTECTION (Patient taking differently: Take 20 mg by mouth daily.)   Current Outpatient Medications (Analgesics):    aspirin EC 81 MG tablet, Take 81 mg by mouth daily. Swallow whole.   Current Outpatient Medications (Other):    blood glucose meter kit and supplies, Test sugars once to twice daily   Cholecalciferol (VITAMIN D3) 125 MCG (5000 UT) CAPS, Take 5,000 Units by mouth daily.   co-enzyme Q-10 30 MG capsule, Take 30 mg by mouth 3 (three) times daily.   Multiple Vitamin (MULTIVITAMIN) tablet, Take 1 tablet by mouth daily.   lactulose (CHRONULAC) 10 GM/15ML solution, Take 30 mLs (20 g total) by mouth 2 (two) times daily. (Patient not taking: Reported on  01/30/2021)  Current Problems (verified) Patient Active Problem List   Diagnosis Date Noted   Coronary artery disease involving native coronary artery of native heart without angina pectoris 08/14/2020   CKD stage 2 due to type 2 diabetes mellitus ([place]) 02/11/2020   Primary osteoarthritis of left knee 12/18/2018   beta thalassemia minor (trait) anemia 03/08/2018   Overweight (BMI 25.0-29.9) 08/10/2017   Poor compliance 09/09/2015   DM type 2 (diabetes mellitus, type 2) ([place]) 09/08/2015   Neuropathy, diabetic ([place]) 05/19/2015   Essential hypertension 10/11/2013   Vitamin D deficiency 10/11/2013   Medication management 10/11/2013   Hyperlipidemia associated with type 2 diabetes mellitus ([place]) 03/01/2010   Carotid stenosis 03/01/2010   Allergies Allergies  Allergen Reactions   Ace Inhibitors Cough   Statins Other (See Comments)    Elevated LFT's     SURGICAL HISTORY He  has a past surgical history that includes Coronary artery bypass graft (2008) and Total knee arthroplasty (Left, 12/18/2018). FAMILY HISTORY His family history includes Diabetes  in his sister; Heart attack in his father; Heart disease in his brother and father; Hyperlipidemia in his father; Hypertension in his father. SOCIAL HISTORY He  reports that he quit smoking about 15 years ago. His smoking use included cigarettes. He started smoking about 49 years ago. He has a 34.00 pack-year smoking history. He has never used smokeless tobacco. He reports that he does not drink alcohol and does not use drugs.   Review of Systems  Constitutional:  Negative for malaise/fatigue and weight loss.  HENT:  Negative for hearing loss and tinnitus.   Eyes:  Negative for blurred vision and double vision.  Respiratory:  Negative for cough, sputum production, shortness of breath and wheezing.   Cardiovascular:  Negative for chest pain, palpitations, orthopnea, claudication, leg swelling and PND.  Gastrointestinal:  Negative for  abdominal pain, blood in stool, constipation, diarrhea, heartburn, melena, nausea and vomiting.  Genitourinary: Negative.   Musculoskeletal:  Positive for back pain (intermittent bil lumbar aching, not midline, non-radiating) and joint pain (R knee). Negative for falls and myalgias.  Skin:  Negative for rash.  Neurological:  Negative for dizziness, tingling, sensory change, weakness and headaches.  Endo/Heme/Allergies:  Negative for polydipsia.  Psychiatric/Behavioral: Negative.  Negative for depression, memory loss, substance abuse and suicidal ideas. The patient is not nervous/anxious and does not have insomnia.   All other systems reviewed and are negative.   Objective:   Blood pressure 102/60, pulse 83, temperature 97.7 F (36.5 C), weight 132 lb (59.9 kg), SpO2 98 %. Body mass index is 24.14 kg/m.  General appearance: alert, no distress, WD/WN, male HEENT: normocephalic, sclerae anicteric, TMs pearly, nares patent, no discharge or erythema, pharynx normal Oral cavity: MMM, no lesions Neck: supple, no lymphadenopathy, no thyromegaly, no masses Heart: RRR, normal S1, S2, no murmurs Lungs: CTA bilaterally, no wheezes, rhonchi, or rales Abdomen: +bs, soft, non tender, non distended, no masses, no hepatomegaly, no splenomegaly Musculoskeletal: nontender, no swelling, varus deformity, antalgic gait, well healing vertical left knee scar , + crepitus right knee.  Extremities: no edema, no cyanosis, no clubbing Pulses: 2+ symmetric, upper and lower extremities, normal cap refill Neurological: alert, oriented x 3, CN2-12 intact, strength normal upper extremities and lower extremities, sensation abnormal bilateral feet, DTRs 2+ throughout, no cerebellar signs Psychiatric: normal affect, behavior normal, pleasant   Izora Ribas, NP 12:36 PM [facility]

## 2021-01-30 ENCOUNTER — Other Ambulatory Visit: Payer: Self-pay

## 2021-01-30 ENCOUNTER — Ambulatory Visit (INDEPENDENT_AMBULATORY_CARE_PROVIDER_SITE_OTHER): Payer: Medicare Other | Admitting: Adult Health

## 2021-01-30 ENCOUNTER — Encounter: Payer: Self-pay | Admitting: Adult Health

## 2021-01-30 VITALS — BP 102/60 | HR 83 | Temp 97.7°F | Wt 132.0 lb

## 2021-01-30 DIAGNOSIS — E1122 Type 2 diabetes mellitus with diabetic chronic kidney disease: Secondary | ICD-10-CM

## 2021-01-30 DIAGNOSIS — E1142 Type 2 diabetes mellitus with diabetic polyneuropathy: Secondary | ICD-10-CM

## 2021-01-30 DIAGNOSIS — Z91199 Patient's noncompliance with other medical treatment and regimen due to unspecified reason: Secondary | ICD-10-CM

## 2021-01-30 DIAGNOSIS — E1169 Type 2 diabetes mellitus with other specified complication: Secondary | ICD-10-CM | POA: Diagnosis not present

## 2021-01-30 DIAGNOSIS — Z79899 Other long term (current) drug therapy: Secondary | ICD-10-CM | POA: Diagnosis not present

## 2021-01-30 DIAGNOSIS — N182 Chronic kidney disease, stage 2 (mild): Secondary | ICD-10-CM | POA: Diagnosis not present

## 2021-01-30 DIAGNOSIS — E663 Overweight: Secondary | ICD-10-CM

## 2021-01-30 DIAGNOSIS — E785 Hyperlipidemia, unspecified: Secondary | ICD-10-CM

## 2021-01-30 MED ORDER — TRULICITY 0.75 MG/0.5ML ~~LOC~~ SOAJ: 0.7500 mg | SUBCUTANEOUS | 0 refills | Status: DC

## 2021-01-30 NOTE — Patient Instructions (Addendum)
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 These are empty calories that do not signal to your brain that you are full.  Please remember the good carbs are still carbs which convert into sugar. So please measure them out no more than 1/2-1 cup of rice, oatmeal, pasta, and beans Veggies are however free foods! Pile them on.  Not all fruit is created equal. Please see the list below, the fruit at the bottom is higher in sugars than the fruit at the top. Please avoid all dried fruits.        Dulaglutide Injection What is this medication? DULAGLUTIDE (DOO la GLOO tide) treats type 2 diabetes. It works by increasing insulin levels in your body, which decreases your blood sugar (glucose). It also reduces the amount of sugar released into your blood and slows down your digestion. It can also be used to lower the risk of heart attack and stroke in people with type 2 diabetes. Changes to diet and exercise are often combined with this medication. This medicine may be used for other purposes; ask your health care provider or pharmacist if you have questions. COMMON BRAND NAME(S): Trulicity What should I tell my care team before I take this medication? They need to know if you have any of these conditions: Endocrine tumors (MEN 2) or if someone in your family had these tumors Eye disease, vision problems History of  pancreatitis Kidney disease Liver disease Stomach or intestine problems Thyroid cancer or if someone in your family had thyroid cancer An unusual or allergic reaction to dulaglutide, other medications, foods, dyes, or preservatives Pregnant or trying to get pregnant Breast-feeding How should I use this medication? This medication is injected under the skin. You will be taught how to prepare and give it. Take it as directed on the prescription label on the same day of each week. Do NOT prime the pen. Keep taking it unless your care team tells you to stop. If you use this medication with insulin, you should inject this medication and the insulin separately. Do not mix them together. Do not give the injections right next to each other. Change (rotate) injection sites with each injection. This medication comes with INSTRUCTIONS FOR USE. Ask your pharmacist for directions on how to use this medication. Read the information carefully. Talk to your pharmacist or care team if you have questions. It is important that you put your used needles and syringes in a special sharps container. Do not put them in a trash can. If you do not have a sharps container, call your pharmacist or care team to get one. A special MedGuide will be given to you by the pharmacist with each prescription and refill. Be sure to read this information carefully each time. Talk to your care team about the use of this medication in children. Special care  may be needed. Overdosage: If you think you have taken too much of this medicine contact a poison control center or emergency room at once. NOTE: This medicine is only for you. Do not share this medicine with others. What if I miss a dose? If you miss a dose, take it as soon as you can unless it is more than 3 days late. If it is more than 3 days late, skip the missed dose. Take the next dose at the normal time. What may interact with this medication? Other medications for  diabetes Many medications may cause changes in blood sugar, these include: Alcohol containing beverages Antiviral medications for HIV or AIDS Aspirin and aspirin-like medications Certain medications for blood pressure, heart disease, irregular heart beat Chromium Diuretics Male hormones, such as estrogens or progestins, birth control pills Fenofibrate Gemfibrozil Isoniazid Lanreotide Male hormones or anabolic steroids MAOIs like Carbex, Eldepryl, Marplan, Nardil, and Parnate Medications for allergies, asthma, cold, or cough Medications for depression, anxiety, or psychotic disturbances Medications for weight loss Niacin Nicotine NSAIDs, medications for pain and inflammation, like ibuprofen or naproxen Octreotide Pasireotide Pentamidine Phenytoin Probenecid Quinolone antibiotics such as ciprofloxacin, levofloxacin, ofloxacin Some herbal dietary supplements Steroid medications such as prednisone or cortisone Sulfamethoxazole; trimethoprim Thyroid hormones Some medications can hide the warning symptoms of low blood sugar (hypoglycemia). You may need to monitor your blood sugar more closely if you are taking one of these medications. These include: Beta-blockers, often used for high blood pressure or heart problems (examples include atenolol, metoprolol, propranolol) Clonidine Guanethidine Reserpine This list may not describe all possible interactions. Give your health care provider a list of all the medicines, herbs, non-prescription drugs, or dietary supplements you use. Also tell them if you smoke, drink alcohol, or use illegal drugs. Some items may interact with your medicine. What should I watch for while using this medication? Visit your care team for regular checks on your progress. Check with your care team if you have severe diarrhea, nausea, and vomiting, or if you sweat a lot. The loss of too much body fluid may make it dangerous for you to take this medication. A  test called the HbA1C (A1C) will be monitored. This is a simple blood test. It measures your blood sugar control over the last 2 to 3 months. You will receive this test every 3 to 6 months. Learn how to check your blood sugar. Learn the symptoms of low and high blood sugar and how to manage them. Always carry a quick-source of sugar with you in case you have symptoms of low blood sugar. Examples include hard sugar candy or glucose tablets. Make sure others know that you can choke if you eat or drink when you develop serious symptoms of low blood sugar, such as seizures or unconsciousness. Get medical help at once. Tell your care team if you have high blood sugar. You might need to change the dose of your medication. If you are sick or exercising more than usual, you may need to change the dose of your medication. Do not skip meals. Ask your care team if you should avoid alcohol. Many nonprescription cough and cold products contain sugar or alcohol. These can affect blood sugar. Pens should never be shared. Even if the needle is changed, sharing may result in passing of viruses like hepatitis or HIV. Wear a medical ID bracelet or chain. Carry a card that describes your condition. List the medications and doses you take on the card. What side effects may  I notice from receiving this medication? Side effects that you should report to your care team as soon as possible: Allergic reactions--skin rash, itching, hives, swelling of the face, lips, tongue, or throat Change in vision Dehydration--increased thirst, dry mouth, feeling faint or lightheaded, headache, dark yellow or brown urine Kidney injury--decrease in the amount of urine, swelling of the ankles, hands, or feet Pancreatitis--severe stomach pain that spreads to your back or gets worse after eating or when touched, fever, nausea, vomiting Thyroid cancer--new mass or lump in the neck, pain or trouble swallowing, trouble breathing, hoarseness Side  effects that usually do not require medical attention (report to your care team if they continue or are bothersome): Diarrhea Loss of appetite Nausea Stomach pain Vomiting This list may not describe all possible side effects. Call your doctor for medical advice about side effects. You may report side effects to FDA at 1-800-FDA-1088. Where should I keep my medication? Keep out of the reach of children and pets. Refrigeration (preferred): Store unopened pens in a refrigerator between 2 and 8 degrees C (36 and 46 degrees F). Keep it in the original carton until you are ready to take it. Do not freeze or use if the medication has been frozen. Protect from light. Get rid of any unused medication after the expiration date on the label. Room Temperature: The pen may be stored at room temperature below 30 degrees C (86 degrees F) for up to a total of 14 days if needed. Protect from light. Avoid exposure to extreme heat. If it is stored at room temperature, throw away any unused medication after 14 days or after it expires, whichever is first. To get rid of medications that are no longer needed or have expired: Take the medication to a medication take-back program. Check with your pharmacy or law enforcement to find a location. If you cannot return the medication, ask your pharmacist or care team how to get rid of this medication safely. NOTE: This sheet is a summary. It may not cover all possible information. If you have questions about this medicine, talk to your doctor, pharmacist, or health care provider.  2022 Elsevier/Gold Standard (2020-06-01 00:00:00)

## 2021-01-31 ENCOUNTER — Telehealth: Payer: Self-pay | Admitting: Internal Medicine

## 2021-01-31 NOTE — Chronic Care Management (AMB) (Signed)
  Chronic Care Management   Outreach Note  01/31/2021 Name: William Haney MRN: [id number] DOB: 07-29-1950  Referred by: Unk Pinto, MD Reason for referral : No chief complaint on file.   An unsuccessful telephone outreach was attempted today. The patient was referred to the pharmacist for assistance with care management and care coordination.   Follow Up Plan:   Tatjana Dellinger Upstream Scheduler

## 2021-02-22 ENCOUNTER — Other Ambulatory Visit: Payer: Self-pay | Admitting: Internal Medicine

## 2021-02-22 DIAGNOSIS — I10 Essential (primary) hypertension: Secondary | ICD-10-CM

## 2021-02-22 DIAGNOSIS — T464X5A Adverse effect of angiotensin-converting-enzyme inhibitors, initial encounter: Secondary | ICD-10-CM

## 2021-02-28 NOTE — Progress Notes (Signed)
DIABETES FOLLOW UP  Assessment:    Essential hypertension Continue current medications Monitor blood pressure at home; call if consistently over 130/80 Continue DASH diet.   Reminder to go to the ER if any CP, SOB, nausea, dizziness, severe HA, changes vision/speech, left arm numbness and tingling and jaw pain.  Atherosclerosis of native coronary artery of native heart without angina pectoris Control blood pressure, cholesterol, glucose, increase exercise.   Diabetic polyneuropathy associated with type 2 diabetes mellitus (HCC) Intermittent burning in bilateral feet, none recent, declines medications Control sugars, check feet daily   Type 2 diabetes mellitus with stage 2 chronic kidney disease, without long-term current use of insulin (HCC) Continue medications: Metformin 500mg  2 tab BID with meals., Jardiance 25 mg daily and glimepiride 4mg  BID.  Due to hypoglycemia and inadequate control stopped glimepiride and started trulicity - has tolerated 0.75 mg dose well thus far, had trouble filling med, will send in next dose 1.5 mg/week after discussion  No labs today, too soon for recheck  Keep glucose and food intake log A1C goal <7.5% for surgery  Discussed general issues about diabetes pathophysiology and management., Educational material distributed., Suggested low cholesterol diet., Encouraged aerobic exercise., Discussed foot care., Reminded to get yearly retinal exam- report for this year in system  Hyperlipidemia associated with T2DM ([place]) Continue statin for LDL goal <70  CKD 2 associated with T2DM ([place]) Continue telmisartan and jardiance; push water intake; control sugars  BMI 23 Continue to recommend diet heavy in fruits and veggies and low in animal meats, cheeses, and dairy products, appropriate calorie intake Discuss exercise recommendations routinely Continue to monitor weight at each visit    Future Appointments  Date Time Provider [place]  05/15/2021  10:00 AM [facility]  06/05/2021 10:00 AM Magda Bernheim, NP [facility] None  01/10/2022 11:00 AM Magda Bernheim, NP [facility] None    Further disposition pending results if labs check today. Discussed med's effects and SE's.   Over 15 minutes of face to face interview, exam, counseling, chart review, and critical decision making was performed.     Subjective:  William Haney is a 70 y.o. male who presents for 4 week follow up for HTN, hyperlipidemia, diabetes with CKD.   S/p left knee replacement with Dr. Berenice Primas, now have right knee pain and would like to have that knee replaced, working to improve A1C to <7.5% for this, close follow up to help achieve this goal.    BMI is Body mass index is 23.96 kg/m., he has been working on diet and exercise. Gets on stationary cycle 20-25 min daily.  Wt Readings from Last 3 Encounters:  03/01/21 131 lb (59.4 kg)  01/30/21 132 lb (59.9 kg)  01/10/21 131 lb 9.6 oz (59.7 kg)   His blood pressure has been controlled at home (120/80), today their BP is BP: 110/66 He does workout.  He denies chest pain, shortness of breath, dizziness.    He is on cholesterol medication (rosuvastatin 40 mg daily) and denies myalgias. His cholesterol is at goal. The cholesterol last visit was:   Lab Results  Component Value Date   CHOL 101 01/10/2021   HDL 36 (L) 01/10/2021   LDLCALC 47 01/10/2021   TRIG 93 01/10/2021   CHOLHDL 2.8 01/10/2021   He has been working on diet and exercise for diabetes  with history of CAD/PAD, he is on 81mg  ASA With hyperlipidemia on crestor 40 at goal less than 70 With CKD  he is on telmisartan Retinopathy - 05/10/2020 no retinopathy - has had in the past  he is on metformin 2000 mg daily Jardiance 25 mg daily - gets with [facility] Glimepiride stopped and trulicity started due to hypoglycemia/poor control Had trouble getting filled, just started 1 week ago but tolerated well  Fasting has improved from 150 average to 130  in the last week Has reported parasthesias in the past but none recently, has had foot exam this year  Denies foot ulcerations, hyperglycemia, polydipsia, polyuria and visual disturbances.  Last A1C in the office was:  Lab Results  Component Value Date   HGBA1C 9.4 (H) 01/10/2021   CKD II range, on telmisartan Lab Results  Component Value Date   GFRNONAA 64 08/09/2020     Medication Review:  Current Outpatient Medications (Endocrine & Metabolic):    Dulaglutide (TRULICITY) 2.87 GO/1.1XB SOPN, Inject 0.75 mg into the skin once a week.   empagliflozin (JARDIANCE) 25 MG TABS tablet, Take 1 tablet (25 mg total) by mouth daily.   metFORMIN (GLUCOPHAGE) 500 MG tablet, TAKE 2 TABLETS TWICE A DAY WITH MEALS FOR DIABETES (Patient taking differently: Take 1,000 mg by mouth 2 (two) times daily with a meal.)  Current Outpatient Medications (Cardiovascular):    metoprolol tartrate (LOPRESSOR) 25 MG tablet, Take      1 tablet       2 x /day (every 12 hours)        for BP (Patient taking differently: Take 25 mg by mouth 2 (two) times daily.)   rosuvastatin (CRESTOR) 40 MG tablet, TAKE 1 TABLET BY MOUTH EVERY DAY FOR CHOLESTEROL   telmisartan (MICARDIS) 20 MG tablet, TAKE 1 TABLET BY MOUTH DAILY FOR BLOOD PRESSURE AND KIDNEY PROTECTION   Current Outpatient Medications (Analgesics):    aspirin EC 81 MG tablet, Take 81 mg by mouth daily. Swallow whole.   Current Outpatient Medications (Other):    blood glucose meter kit and supplies, Test sugars once to twice daily   Cholecalciferol (VITAMIN D3) 125 MCG (5000 UT) CAPS, Take 5,000 Units by mouth daily.   co-enzyme Q-10 30 MG capsule, Take 30 mg by mouth 3 (three) times daily.   lactulose (CHRONULAC) 10 GM/15ML solution, Take 30 mLs (20 g total) by mouth 2 (two) times daily.   Multiple Vitamin (MULTIVITAMIN) tablet, Take 1 tablet by mouth daily.  Current Problems (verified) Patient Active Problem List   Diagnosis Date Noted   Coronary artery  disease involving native coronary artery of native heart without angina pectoris 08/14/2020   CKD stage 2 due to type 2 diabetes mellitus ([place]) 02/11/2020   Primary osteoarthritis of left knee 12/18/2018   beta thalassemia minor (trait) anemia 03/08/2018   Overweight (BMI 25.0-29.9) 08/10/2017   Poor compliance 09/09/2015   DM type 2 (diabetes mellitus, type 2) ([place]) 09/08/2015   Neuropathy, diabetic ([place]) 05/19/2015   Essential hypertension 10/11/2013   Vitamin D deficiency 10/11/2013   Medication management 10/11/2013   Hyperlipidemia associated with type 2 diabetes mellitus ([place]) 03/01/2010   Carotid stenosis 03/01/2010   Allergies Allergies  Allergen Reactions   Ace Inhibitors Cough   Statins Other (See Comments)    Elevated LFT's     SURGICAL HISTORY He  has a past surgical history that includes Coronary artery bypass graft (2008) and Total knee arthroplasty (Left, 12/18/2018). FAMILY HISTORY His family history includes Diabetes in his sister; Heart attack in his father; Heart disease in his brother and father; Hyperlipidemia in his father; Hypertension  in his father. SOCIAL HISTORY He  reports that he quit smoking about 15 years ago. His smoking use included cigarettes. He started smoking about 50 years ago. He has a 34.00 pack-year smoking history. He has never used smokeless tobacco. He reports that he does not drink alcohol and does not use drugs.   Review of Systems  Constitutional:  Negative for malaise/fatigue and weight loss.  HENT:  Negative for hearing loss and tinnitus.   Eyes:  Negative for blurred vision and double vision.  Respiratory:  Negative for cough, sputum production, shortness of breath and wheezing.   Cardiovascular:  Negative for chest pain, palpitations, orthopnea, claudication, leg swelling and PND.  Gastrointestinal:  Negative for abdominal pain, blood in stool, constipation, diarrhea, heartburn, melena, nausea and vomiting.  Genitourinary:  Negative.   Musculoskeletal:  Positive for back pain (intermittent bil lumbar aching, not midline, non-radiating) and joint pain (R knee). Negative for falls and myalgias.  Skin:  Negative for rash.  Neurological:  Negative for dizziness, tingling, sensory change, weakness and headaches.  Endo/Heme/Allergies:  Negative for polydipsia.  Psychiatric/Behavioral: Negative.  Negative for depression, memory loss, substance abuse and suicidal ideas. The patient is not nervous/anxious and does not have insomnia.   All other systems reviewed and are negative.   Objective:   Blood pressure 110/66, pulse 77, temperature 97.7 F (36.5 C), weight 131 lb (59.4 kg), SpO2 99 %. Body mass index is 23.96 kg/m.  General appearance: alert, no distress, WD/WN, male HEENT: normocephalic, sclerae anicteric, TMs pearly, nares patent, no discharge or erythema, pharynx normal Oral cavity: MMM, no lesions Neck: supple, no lymphadenopathy, no thyromegaly, no masses Heart: RRR, normal S1, S2, no murmurs Lungs: CTA bilaterally, no wheezes, rhonchi, or rales Abdomen: +bs, soft, non tender, non distended, no masses, no hepatomegaly, no splenomegaly Musculoskeletal: nontender, no swelling, varus deformity, antalgic gait,  Extremities: no edema, no cyanosis, no clubbing Pulses: 2+ symmetric, upper and lower extremities, normal cap refill Neurological: alert, oriented x 3, CN2-12 intact, strength normal upper extremities and lower extremities, sensation abnormal bilateral feet, DTRs 2+ throughout, no cerebellar signs Psychiatric: normal affect, behavior normal, pleasant   Izora Ribas, NP 10:56 AM [facility]

## 2021-03-01 ENCOUNTER — Ambulatory Visit (INDEPENDENT_AMBULATORY_CARE_PROVIDER_SITE_OTHER): Payer: Medicare Other | Admitting: Adult Health

## 2021-03-01 ENCOUNTER — Other Ambulatory Visit: Payer: Self-pay

## 2021-03-01 ENCOUNTER — Encounter: Payer: Self-pay | Admitting: Adult Health

## 2021-03-01 VITALS — BP 110/66 | HR 77 | Temp 97.7°F | Wt 131.0 lb

## 2021-03-01 DIAGNOSIS — Z79899 Other long term (current) drug therapy: Secondary | ICD-10-CM

## 2021-03-01 DIAGNOSIS — N182 Chronic kidney disease, stage 2 (mild): Secondary | ICD-10-CM | POA: Diagnosis not present

## 2021-03-01 DIAGNOSIS — I10 Essential (primary) hypertension: Secondary | ICD-10-CM | POA: Diagnosis not present

## 2021-03-01 DIAGNOSIS — E785 Hyperlipidemia, unspecified: Secondary | ICD-10-CM

## 2021-03-01 DIAGNOSIS — E1169 Type 2 diabetes mellitus with other specified complication: Secondary | ICD-10-CM | POA: Diagnosis not present

## 2021-03-01 DIAGNOSIS — E1122 Type 2 diabetes mellitus with diabetic chronic kidney disease: Secondary | ICD-10-CM

## 2021-03-01 MED ORDER — TRULICITY 1.5 MG/0.5ML ~~LOC~~ SOAJ: 1.5000 mg | SUBCUTANEOUS | 3 refills | Status: DC

## 2021-03-09 ENCOUNTER — Telehealth: Payer: Self-pay

## 2021-03-09 NOTE — Telephone Encounter (Signed)
Patient meets program eligibility requirements and is enrolled in [facility] until the end of the 2023 calendar year.

## 2021-04-09 DIAGNOSIS — M25561 Pain in right knee: Secondary | ICD-10-CM | POA: Diagnosis not present

## 2021-04-09 DIAGNOSIS — M25562 Pain in left knee: Secondary | ICD-10-CM | POA: Diagnosis not present

## 2021-04-09 DIAGNOSIS — M1711 Unilateral primary osteoarthritis, right knee: Secondary | ICD-10-CM | POA: Diagnosis not present

## 2021-04-12 ENCOUNTER — Other Ambulatory Visit: Payer: Self-pay | Admitting: Internal Medicine

## 2021-04-27 ENCOUNTER — Other Ambulatory Visit: Payer: Self-pay | Admitting: Internal Medicine

## 2021-05-03 ENCOUNTER — Telehealth: Payer: Self-pay | Admitting: Internal Medicine

## 2021-05-03 NOTE — Chronic Care Management (AMB) (Signed)
°  Chronic Care Management   Outreach Note  05/03/2021 Name: Kavaughn Faucett MRN: [id number] DOB: 05/17/1950  Referred by: Unk Pinto, MD Reason for referral : No chief complaint on file.   A second unsuccessful telephone outreach was attempted today. The patient was referred to pharmacist for assistance with care management and care coordination.  Follow Up Plan:   Tatjana Dellinger Upstream Scheduler

## 2021-05-04 ENCOUNTER — Other Ambulatory Visit (HOSPITAL_COMMUNITY): Payer: Self-pay | Admitting: Cardiology

## 2021-05-04 ENCOUNTER — Other Ambulatory Visit: Payer: Self-pay

## 2021-05-04 ENCOUNTER — Ambulatory Visit (HOSPITAL_COMMUNITY)
Admission: RE | Admit: 2021-05-04 | Discharge: 2021-05-04 | Disposition: A | Discharge status: Home / Self Care | Payer: Medicare Other | Source: Ambulatory Visit | Attending: Cardiology | Admitting: Cardiology

## 2021-05-04 DIAGNOSIS — I6523 Occlusion and stenosis of bilateral carotid arteries: Secondary | ICD-10-CM | POA: Insufficient documentation

## 2021-05-08 ENCOUNTER — Telehealth: Payer: Self-pay | Admitting: Internal Medicine

## 2021-05-08 NOTE — Progress Notes (Signed)
°  Chronic Care Management   Outreach Note  05/08/2021 Name: Jakhai Fant MRN: [id number] DOB: January 25, 1951  Referred by: Unk Pinto, MD Reason for referral : No chief complaint on file.   Third unsuccessful telephone outreach was attempted today. The patient was referred to the pharmacist for assistance with care management and care coordination.   Follow Up Plan:   Tatjana Dellinger Upstream Scheduler

## 2021-05-09 ENCOUNTER — Encounter: Payer: Self-pay | Admitting: *Deleted

## 2021-05-15 ENCOUNTER — Ambulatory Visit (HOSPITAL_COMMUNITY): Payer: Medicare Other

## 2021-06-04 NOTE — Progress Notes (Signed)
COMPLETE PHYSICAL ? ?Assessment:  ? ?Encounter for routine medical examination with abnormal findings ?Yearly ? ?Essential hypertension ?Continue current medications:lopressor 35m BID, telmisartan 292mdaily ?Monitor blood pressure at home; call if consistently over 130/80 ?Continue DASH diet.   ?Reminder to go to the ER if any CP, SOB, nausea, dizziness, severe HA, changes vision/speech, left arm numbness and tingling and jaw pain. ?-     CBC with Differential/Platelet ?-     CMP/GFR ?-     TSH ? ?Atherosclerosis of native coronary artery of native heart without angina pectoris ?Control blood pressure, cholesterol, glucose, increase exercise.  ?-     Lipid panel ?-     Hemoglobin A1c ? ?Stenosis of carotid artery, unspecified laterality ?Control blood pressure, cholesterol, glucose, increase exercise.  ?Followed by cardiology; moderate disease ?-     Lipid panel ?-     Hemoglobin A1c ? ?Diabetic polyneuropathy associated with type 2 diabetes mellitus ([facility]?Intermittent burning in bilateral feet, none recent, declines medications ?Control sugars, check feet daily ?-     Hemoglobin A1c ? ?Type 2 diabetes mellitus with stage 2 chronic kidney disease, without long-term current use of insulin ([place]?Continue medications: Metformin 50053m tab BID with meals., Jardiance 65m44mily and glimepiride 4mg 57m. Prescribed Trulicity 1.5 mg SQ QW-not taking regularly ?Discussed general issues about diabetes pathophysiology and management., Educational material distributed., Suggested low cholesterol diet., Encouraged aerobic exercise., Discussed foot care., Reminded to get yearly retinal exam- report for this year in system ?-     Hemoglobin A1c ? ?Type 2 diabetes mellitus with diabetic retinopathy ?Control sugars, followed by Ophthalmology ? ?Hyperlipidemia ?Continue medications: Rosuvastatin 20mg 72my ?Discussed dietary and exercise modifications ?Low fat diet ?-     Lipid panel ? ?Anemia, beta thalassemia minor trait ?-      CBC with Differential/Platelet ? ?Medication management ?-     Magnesium ? ?Vitamin D deficiency ?Continue vitamin D supplement 5,000IU daily ?Defer lab today ? ?Former smoker, 34 pack year history, quit 2007 ?-CXR 06/08/20- no abnormality ? ? ?BMI 23.0-23.9 ?Contact office with any new or worsening symptoms ? ?Screening for hematuria or proteinuria ?- Routine UA with reflex microscopic ?- Microalbumin/creatinine urine ratio ? ?Screening for ischemic heart disease ?- EKG ? ?Screening for prostate cancer ?- PSA ? ? ?Future Appointments  ?Date Time Provider DepartBray2/2023 11:00 AM Jia Mohamed, Magda BernheimAAM-GAAIM None  ?06/06/2022 10:00 AM Donnald Tabar WTownsend RogerAAM-GAAIM None  ? ? ?Further disposition pending results if labs check today. Discussed med's effects and SE's.   ?Over 30 minutes of face to face interview, exam, counseling, chart review, and critical decision making was performed.  ? ? ? ?Subjective:  ?William Jaidin Richison70 y.o101male who presents for medicare wellness and 3 month follow up for HTN, hyperlipidemia, diabetes, and vitamin D Def.   ? ?He just returned from visiting his daughter William RamusS/p left knee replacement with Dr. GravesBerenice Primashave right knee pain and would like to have that knee replaced after he gets his kids taken care of with marriage's, daughter doctorate of PT.  [place] ?  ? ?BMI is Body mass index is 23.73 kg/m?., he has been working on diet and exercise. Gets on stationary cycle 20-25 min daily. He is currently in fast. ?Wt Readings from Last 3 Encounters:  ?06/05/21 125 lb 9.6 oz (57 kg)  ?03/01/21 131 lb (59.4  kg)  ?01/30/21 132 lb (59.9 kg)  ? ?He has a history of CAD s/p CABG in 2008, follows with Dr. Percival Spanish. ?Lab Results  ?Component Value Date  ? IRON 62 12/10/2019  ? TIBC 365 12/10/2019  ? FERRITIN 38 02/26/2018  ? ?Lab Results  ?Component Value Date  ? WPYKDXIP38 334 06/08/2020  ? ? ?Also follows for  moderate bilateral carotid stenosis getting annual carotid US ?He has a history of smoking, Q 2008, 35 pack year, last CXR 04/2020 before surgery.  ?His blood pressure has been controlled at home (120/80), today their BP is BP: 132/60 ?He does workout.  He denies chest pain, shortness of breath, dizziness.  ? ?He has been working on diet and exercise for diabetes  ?with history of CAD/PAD ,he is on 17m ASA ?With hyperlipidemia on crestor 40 at goal less than 70 ?With CKD he is on micardis ?Retinopathy - 06/2020, his son is an eye doctor ?he is on metformin 2000 mg daily ?Jardiance 10 mg daily ?Trulicity 1.5 mg SQ QW not taking regularly,afraid to give own injection, his daughter will normally give him his injection130-150 or so. ?HBartholome Billreported parasthesias in the past but none recently ? Denies foot ulcerations, hyperglycemia, polydipsia, polyuria and visual disturbances.  Last A1C in the office was:  ?Lab Results  ?Component Value Date  ? HGBA1C 9.4 (H) 01/10/2021  ? ?Lab Results  ?Component Value Date  ? GFRNONAA 64 08/09/2020  ? ?Patient is on Vitamin D supplement.   ?Lab Results  ?Component Value Date  ? VD25OH 65 10/10/2020  ?    ? ? ?Medication Review: ? ?Current Outpatient Medications (Endocrine & Metabolic):  ?  Dulaglutide (TRULICITY) 1.5 MSN/0.5LZSOPN, Inject 1.5 mg into the skin once a week. ?  empagliflozin (JARDIANCE) 25 MG TABS tablet, Take 1 tablet (25 mg total) by mouth daily. ?  metFORMIN (GLUCOPHAGE) 500 MG tablet, TAKE 2 TABLETS TWICE A DAY WITH MEALS FOR DIABETES ? ?Current Outpatient Medications (Cardiovascular):  ?  metoprolol tartrate (LOPRESSOR) 25 MG tablet, TAKE 1 TABLET TWICE A DAY (EVERY 12 HOURS) FOR BP ?  rosuvastatin (CRESTOR) 40 MG tablet, TAKE 1 TABLET BY MOUTH EVERY DAY FOR CHOLESTEROL ?  telmisartan (MICARDIS) 20 MG tablet, TAKE 1 TABLET BY MOUTH DAILY FOR BLOOD PRESSURE AND KIDNEY PROTECTION ? ? ?Current Outpatient Medications (Analgesics):  ?  aspirin EC 81 MG tablet, Take 81 mg  by mouth daily. Swallow whole. ? ? ?Current Outpatient Medications (Other):  ?  Cholecalciferol (VITAMIN D3) 125 MCG (5000 UT) CAPS, Take 5,000 Units by mouth daily. ?  co-enzyme Q-10 30 MG capsule, Take 30 mg by mouth 3 (three) times daily. ?  Multiple Vitamin (MULTIVITAMIN) tablet, Take 1 tablet by mouth daily. ?  blood glucose meter kit and supplies, Test sugars once to twice daily ?  lactulose (CHRONULAC) 10 GM/15ML solution, Take 30 mLs (20 g total) by mouth 2 (two) times daily. (Patient not taking: Reported on 06/05/2021) ? ?Current Problems (verified) ?Patient Active Problem List  ? Diagnosis Date Noted  ? Coronary artery disease involving native coronary artery of native heart without angina pectoris 08/14/2020  ? CKD stage 2 due to type 2 diabetes mellitus ([place] 02/11/2020  ? Primary osteoarthritis of left knee 12/18/2018  ? beta thalassemia minor (trait) anemia 03/08/2018  ? Overweight (BMI 25.0-29.9) 08/10/2017  ? Poor compliance 09/09/2015  ? DM type 2 (diabetes mellitus, type 2) ([place] 09/08/2015  ? Neuropathy, diabetic ([place] 05/19/2015  ? Essential hypertension 10/11/2013  ?  Vitamin D deficiency 10/11/2013  ? Medication management 10/11/2013  ? Hyperlipidemia associated with type 2 diabetes mellitus ([place]) 03/01/2010  ? Carotid stenosis 03/01/2010  ? ?Allergies ?Allergies  ?Allergen Reactions  ? Ace Inhibitors Cough  ? Statins Other (See Comments)  ?  Elevated LFT's  ? ? ? ? ?Screening Tests ?Immunization History  ?Administered Date(s) Administered  ? DT (Pediatric) 02/07/2015  ? Influenza Whole 12/10/2012  ? Influenza, High Dose Seasonal PF 02/07/2015, 01/02/2016, 12/13/2016, 01/01/2018, 11/19/2018, 02/21/2020  ? Influenza-Unspecified 12/31/2013  ? Meningococcal Conjugate 09/03/2016  ? PFIZER(Purple Top)SARS-COV-2 Vaccination 04/02/2019, 04/23/2019  ? Pneumococcal Conjugate-13 02/02/2014  ? Pneumococcal Polysaccharide-23 04/13/2003, 10/08/2016  ? Td 04/18/2004  ? ?Health Maintenance  ?Topic Date Due  ?  COVID-19 Vaccine (3 - Booster for Pfizer series) 10/21/2019  ? FOOT EXAM  11/19/2019  ? HEMOGLOBIN A1C  06/09/2020  ? OPHTHALMOLOGY EXAM  05/10/2021  ? COLONOSCOPY (Pts 45-49yr Insurance coverage will nee

## 2021-06-05 ENCOUNTER — Encounter: Payer: Self-pay | Admitting: Nurse Practitioner

## 2021-06-05 ENCOUNTER — Other Ambulatory Visit: Payer: Self-pay

## 2021-06-05 ENCOUNTER — Ambulatory Visit (INDEPENDENT_AMBULATORY_CARE_PROVIDER_SITE_OTHER): Payer: Medicare Other | Admitting: Nurse Practitioner

## 2021-06-05 VITALS — BP 132/60 | HR 75 | Temp 97.5°F | Ht 61.0 in | Wt 125.6 lb

## 2021-06-05 DIAGNOSIS — I6529 Occlusion and stenosis of unspecified carotid artery: Secondary | ICD-10-CM | POA: Diagnosis not present

## 2021-06-05 DIAGNOSIS — Z79899 Other long term (current) drug therapy: Secondary | ICD-10-CM

## 2021-06-05 DIAGNOSIS — I2510 Atherosclerotic heart disease of native coronary artery without angina pectoris: Secondary | ICD-10-CM

## 2021-06-05 DIAGNOSIS — E1169 Type 2 diabetes mellitus with other specified complication: Secondary | ICD-10-CM | POA: Diagnosis not present

## 2021-06-05 DIAGNOSIS — E559 Vitamin D deficiency, unspecified: Secondary | ICD-10-CM | POA: Diagnosis not present

## 2021-06-05 DIAGNOSIS — Z0000 Encounter for general adult medical examination without abnormal findings: Secondary | ICD-10-CM

## 2021-06-05 DIAGNOSIS — Z87891 Personal history of nicotine dependence: Secondary | ICD-10-CM

## 2021-06-05 DIAGNOSIS — E663 Overweight: Secondary | ICD-10-CM

## 2021-06-05 DIAGNOSIS — E1122 Type 2 diabetes mellitus with diabetic chronic kidney disease: Secondary | ICD-10-CM | POA: Diagnosis not present

## 2021-06-05 DIAGNOSIS — Z125 Encounter for screening for malignant neoplasm of prostate: Secondary | ICD-10-CM

## 2021-06-05 DIAGNOSIS — I10 Essential (primary) hypertension: Secondary | ICD-10-CM | POA: Diagnosis not present

## 2021-06-05 DIAGNOSIS — Z0001 Encounter for general adult medical examination with abnormal findings: Secondary | ICD-10-CM

## 2021-06-05 DIAGNOSIS — Z136 Encounter for screening for cardiovascular disorders: Secondary | ICD-10-CM

## 2021-06-05 DIAGNOSIS — E1142 Type 2 diabetes mellitus with diabetic polyneuropathy: Secondary | ICD-10-CM

## 2021-06-05 DIAGNOSIS — Z1389 Encounter for screening for other disorder: Secondary | ICD-10-CM

## 2021-06-05 DIAGNOSIS — D509 Iron deficiency anemia, unspecified: Secondary | ICD-10-CM

## 2021-06-05 DIAGNOSIS — E113293 Type 2 diabetes mellitus with mild nonproliferative diabetic retinopathy without macular edema, bilateral: Secondary | ICD-10-CM

## 2021-06-06 ENCOUNTER — Other Ambulatory Visit: Payer: Self-pay | Admitting: Nurse Practitioner

## 2021-06-06 DIAGNOSIS — E875 Hyperkalemia: Secondary | ICD-10-CM

## 2021-06-06 LAB — CBC WITH DIFFERENTIAL/PLATELET
Absolute Monocytes: 598 cells/uL (ref 200–950)
Basophils Absolute: 72 cells/uL (ref 0–200)
Basophils Relative: 1.1 %
Eosinophils Absolute: 358 cells/uL (ref 15–500)
Eosinophils Relative: 5.5 %
HCT: 40 % (ref 38.5–50.0)
Hemoglobin: 11.4 g/dL — ABNORMAL LOW (ref 13.2–17.1)
Lymphs Abs: 1313 cells/uL (ref 850–3900)
MCH: 20.2 pg — ABNORMAL LOW (ref 27.0–33.0)
MCHC: 28.5 g/dL — ABNORMAL LOW (ref 32.0–36.0)
MCV: 70.9 fL — ABNORMAL LOW (ref 80.0–100.0)
Monocytes Relative: 9.2 %
Neutro Abs: 4160 cells/uL (ref 1500–7800)
Neutrophils Relative %: 64 %
Platelets: 331 10*3/uL (ref 140–400)
RBC: 5.64 10*6/uL (ref 4.20–5.80)
RDW: 18 % — ABNORMAL HIGH (ref 11.0–15.0)
Total Lymphocyte: 20.2 %
WBC: 6.5 10*3/uL (ref 3.8–10.8)

## 2021-06-06 LAB — COMPLETE METABOLIC PANEL WITH GFR
AG Ratio: 1.6 (calc) (ref 1.0–2.5)
ALT: 36 U/L (ref 9–46)
AST: 27 U/L (ref 10–35)
Albumin: 3.8 g/dL (ref 3.6–5.1)
Alkaline phosphatase (APISO): 82 U/L (ref 35–144)
BUN: 17 mg/dL (ref 7–25)
CO2: 26 mmol/L (ref 20–32)
Calcium: 9.6 mg/dL (ref 8.6–10.3)
Chloride: 100 mmol/L (ref 98–110)
Creat: 1.24 mg/dL (ref 0.70–1.28)
Globulin: 2.4 g/dL (calc) (ref 1.9–3.7)
Glucose, Bld: 360 mg/dL — ABNORMAL HIGH (ref 65–99)
Potassium: 5.9 mmol/L — ABNORMAL HIGH (ref 3.5–5.3)
Sodium: 132 mmol/L — ABNORMAL LOW (ref 135–146)
Total Bilirubin: 0.6 mg/dL (ref 0.2–1.2)
Total Protein: 6.2 g/dL (ref 6.1–8.1)
eGFR: 62 mL/min/{1.73_m2} (ref >=60)

## 2021-06-06 LAB — PSA: PSA: 0.52 ng/mL (ref <=4.00)

## 2021-06-06 LAB — URINALYSIS, ROUTINE W REFLEX MICROSCOPIC
Bilirubin Urine: NEGATIVE
Hgb urine dipstick: NEGATIVE
Ketones, ur: NEGATIVE
Leukocytes,Ua: NEGATIVE
Nitrite: NEGATIVE
Protein, ur: NEGATIVE
Specific Gravity, Urine: 1.039 — ABNORMAL HIGH (ref 1.001–1.035)
pH: 6.5 (ref 5.0–8.0)

## 2021-06-06 LAB — LIPID PANEL
Cholesterol: 79 mg/dL (ref <200)
HDL: 37 mg/dL — ABNORMAL LOW (ref >=40)
LDL Cholesterol (Calc): 23 mg/dL (calc)
Non-HDL Cholesterol (Calc): 42 mg/dL (calc) (ref <130)
Total CHOL/HDL Ratio: 2.1 (calc) (ref <5.0)
Triglycerides: 108 mg/dL (ref <150)

## 2021-06-06 LAB — MAGNESIUM: Magnesium: 2.1 mg/dL (ref 1.5–2.5)

## 2021-06-06 LAB — HEMOGLOBIN A1C
Hgb A1c MFr Bld: 11.3 % of total Hgb — ABNORMAL HIGH (ref <5.7)
Mean Plasma Glucose: 278 mg/dL
eAG (mmol/L): 15.4 mmol/L

## 2021-06-06 LAB — MICROALBUMIN / CREATININE URINE RATIO
Creatinine, Urine: 31 mg/dL (ref 20–320)
Microalb Creat Ratio: 6 mcg/mg creat (ref <30)
Microalb, Ur: 0.2 mg/dL

## 2021-06-06 LAB — TSH: TSH: 1.9 mIU/L (ref 0.40–4.50)

## 2021-06-06 LAB — VITAMIN D 25 HYDROXY (VIT D DEFICIENCY, FRACTURES): Vit D, 25-Hydroxy: 62 ng/mL (ref 30–100)

## 2021-06-11 ENCOUNTER — Ambulatory Visit (INDEPENDENT_AMBULATORY_CARE_PROVIDER_SITE_OTHER): Payer: Medicare Other

## 2021-06-11 DIAGNOSIS — E875 Hyperkalemia: Secondary | ICD-10-CM | POA: Diagnosis not present

## 2021-06-11 NOTE — Progress Notes (Signed)
Patient presents to the office for a nurse visit to have labs done. Checking sodium levels. States that he is still fasting.  ?

## 2021-06-12 LAB — BASIC METABOLIC PANEL WITH GFR
BUN: 18 mg/dL (ref 7–25)
CO2: 25 mmol/L (ref 20–32)
Calcium: 10 mg/dL (ref 8.6–10.3)
Chloride: 102 mmol/L (ref 98–110)
Creat: 1.1 mg/dL (ref 0.70–1.28)
Glucose, Bld: 240 mg/dL — ABNORMAL HIGH (ref 65–99)
Potassium: 5.4 mmol/L — ABNORMAL HIGH (ref 3.5–5.3)
Sodium: 137 mmol/L (ref 135–146)
eGFR: 71 mL/min/{1.73_m2} (ref >=60)

## 2021-06-16 LAB — HM DIABETES EYE EXAM

## 2021-06-18 ENCOUNTER — Encounter: Payer: Self-pay | Admitting: Internal Medicine

## 2021-08-05 ENCOUNTER — Other Ambulatory Visit: Payer: Self-pay | Admitting: Cardiology

## 2021-09-05 ENCOUNTER — Ambulatory Visit: Payer: Medicare Other | Admitting: Internal Medicine

## 2021-09-05 NOTE — Patient Instructions (Signed)

## 2021-09-05 NOTE — Progress Notes (Addendum)
    C  A  N  C  E  L  L  E  D  A M     of     A P  P 'T

## 2021-09-17 ENCOUNTER — Encounter: Payer: Self-pay | Admitting: Internal Medicine

## 2021-09-17 NOTE — Patient Instructions (Signed)

## 2021-09-17 NOTE — Progress Notes (Unsigned)
History of Present Illness:  Future Appointments  Date Time Provider Department  09/05/2021 11:30 AM Unk Pinto, MD [facility]  01/10/2022             Wellness  11:00 AM Alycia Rossetti, NP [facility]  06/06/2022              CPE 10:00 AM Alycia Rossetti, NP [facility]         This very nice 71 y.o. married  man presents for 3 month follow up with HTN, ASCAD s/p CABG,  HLD, T2_NIDDM and Vitamin D Deficiency.       Patient is treated for HTN & BP has been controlled at home. Today's BP is at goal - 122/74.  In 2008, patient underwent a CABG and is also followed by Dr Percival Spanish.  Patient has had no complaints of any cardiac type chest pain, palpitations, dyspnea / orthopnea / PND, dizziness, claudication, or dependent edema.      Hyperlipidemia is controlled with diet & Rosuvastatin. Patient denies myalgias or other med SE's. Last Lipids were   Lab Results  Component Value Date   CHOL 79 06/05/2021   HDL 37 (L) 06/05/2021   LDLCALC 23 06/05/2021   TRIG 108 06/05/2021   CHOLHDL 2.1 06/05/2021    Also, the patient has history of T2_NIDDM since 2000  (Stage 2 CKD / GFR 71 ) and has had no symptoms of reactive hypoglycemia, diabetic polys, paresthesias or visual blurring.  Last A1c on Metformin, Glimepiride and Jardiance was not at goal.  Lab Results  Component Value Date   HGBA1C 11.3 (H) 06/05/2021           Further, the patient also has history of Vitamin D Deficiency ("37" / 2017)  and supplements vitamin D without any suspected side-effects. Last vitamin D was not at goal:  Lab Results  Component Value Date   VD25OH 62 06/05/2021    Current Outpatient Medications:    aspirin EC 81 MG tablet, Take 81 mg by mouth daily. Swallow whole., Disp: , Rfl:    blood glucose meter kit and supplies, Test sugars once to twice daily, Disp: 1 each, Rfl: 0   Cholecalciferol (VITAMIN D3) 125 MCG (5000 UT) CAPS, Take 5,000 Units by mouth daily., Disp: , Rfl:    co-enzyme Q-10 30  MG capsule, Take 30 mg by mouth 3 (three) times daily., Disp: , Rfl:    Dulaglutide (TRULICITY) 1.5 ZO/1.0RU SOPN, Inject 1.5 mg into the skin once a week., Disp: 6 mL, Rfl: 3   empagliflozin (JARDIANCE) 25 MG TABS tablet, Take 1 tablet (25 mg total) by mouth daily., Disp: 90 tablet, Rfl: 3   lactulose (CHRONULAC) 10 GM/15ML solution, Take 30 mLs (20 g total) by mouth 2 (two) times daily. (Patient not taking: Reported on 06/05/2021), Disp: 236 mL, Rfl: 0   metFORMIN (GLUCOPHAGE) 500 MG tablet, TAKE 2 TABLETS TWICE A DAY WITH MEALS FOR DIABETES, Disp: 360 tablet, Rfl: 3   metoprolol tartrate (LOPRESSOR) 25 MG tablet, TAKE 1 TABLET TWICE A DAY (EVERY 12 HOURS) FOR BP, Disp: 180 tablet, Rfl: 1   Multiple Vitamin (MULTIVITAMIN) tablet, Take 1 tablet by mouth daily., Disp: , Rfl:    rosuvastatin (CRESTOR) 40 MG tablet, TAKE 1 TABLET BY MOUTH EVERY DAY FOR CHOLESTEROL, Disp: 90 tablet, Rfl: 1   telmisartan (MICARDIS) 20 MG tablet, TAKE 1 TABLET BY MOUTH DAILY FOR BLOOD PRESSURE AND KIDNEY PROTECTION, Disp: 90 tablet, Rfl: 3  Allergies  Allergen Reactions   Ace Inhibitors Cough   Statins Other (See Comments)    Elevated LFT's    PMHx:   Past Medical History:  Diagnosis Date   Acute metabolic encephalopathy 1/51/7616   Anemia    Carotid artery stenosis 01/2017   L-ICA 1-39%, R-ICA 40-59% doppler   Coronary artery disease    s/p CABG in 2008, w/ LIMA-LAD, SVG-Diag, SVG-OM, SVG-PL   Hypertension    Vitamin D deficiency     Immunization History  Administered Date(s) Administered   DT (Pediatric) 02/07/2015   Influenza Whole 12/10/2012   Influenza, High Dose Seasonal PF 02/07/2015, 01/02/2016, 12/13/2016, 01/01/2018, 11/19/2018, 02/21/2020, 01/10/2021   Influenza-Unspecified 12/31/2013   Meningococcal Conjugate 09/03/2016   PFIZER(Purple Top)SARS-COV-2 Vaccination 04/02/2019, 04/23/2019   Pneumococcal Conjugate-13 02/02/2014   Pneumococcal Polysaccharide-23 04/13/2003, 10/08/2016   Td  04/18/2004    Past Surgical History:  Procedure Laterality Date   CORONARY ARTERY BYPASS GRAFT  2008   LIMA to the LAD, saphenous vein graft to diagonal, saphenous vein graft to OM, saphenous vein graft to posterolateral 2008   TOTAL KNEE ARTHROPLASTY Left 12/18/2018   Procedure: TOTAL KNEE ARTHROPLASTY;  Surgeon: Dorna Leitz, MD;  Location: [facility];  Service: Orthopedics;  Laterality: Left;    FHx:    Reviewed / unchanged  SHx:    Reviewed / unchanged   Systems Review:  Constitutional: Denies fever, chills, wt changes, headaches, insomnia, fatigue, night sweats, change in appetite. Eyes: Denies redness, blurred vision, diplopia, discharge, itchy, watery eyes.  ENT: Denies discharge, congestion, post nasal drip, epistaxis, sore throat, earache, hearing loss, dental pain, tinnitus, vertigo, sinus pain, snoring.  CV: Denies chest pain, palpitations, irregular heartbeat, syncope, dyspnea, diaphoresis, orthopnea, PND, claudication or edema. Respiratory: denies cough, dyspnea, DOE, pleurisy, hoarseness, laryngitis, wheezing.  Gastrointestinal: Denies dysphagia, odynophagia, heartburn, reflux, water brash, abdominal pain or cramps, nausea, vomiting, bloating, diarrhea, constipation, hematemesis, melena, hematochezia  or hemorrhoids. Genitourinary: Denies dysuria, frequency, urgency, nocturia, hesitancy, discharge, hematuria or flank pain. Musculoskeletal: Denies arthralgias, myalgias, stiffness, jt. swelling, pain, limping or strain/sprain.  Skin: Denies pruritus, rash, hives, warts, acne, eczema or change in skin lesion(s). Neuro: No weakness, tremor, incoordination, spasms, paresthesia or pain. Psychiatric: Denies confusion, memory loss or sensory loss. Endo: Denies change in weight, skin or hair change.  Heme/Lymph: No excessive bleeding, bruising or enlarged lymph nodes.  Physical Exam  There were no vitals taken for this visit.  Appears  well nourished, well groomed  and in no  distress.  Eyes: PERRLA, EOMs, conjunctiva no swelling or erythema. Sinuses: No frontal/maxillary tenderness ENT/Mouth: EAC's clear, TM's nl w/o erythema, bulging. Nares clear w/o erythema, swelling, exudates. Oropharynx clear without erythema or exudates. Oral hygiene is good. Tongue normal, non obstructing. Hearing intact.  Neck: Supple. Thyroid not palpable. Car 2+/2+ without bruits, nodes or JVD. Chest: Respirations nl with BS clear & equal w/o rales, rhonchi, wheezing or stridor.  Cor: Heart sounds normal w/ regular rate and rhythm without sig. murmurs, gallops, clicks or rubs. Peripheral pulses normal and equal  without edema.  Abdomen: Soft & bowel sounds normal. Non-tender w/o guarding, rebound, hernias, masses or organomegaly.  Lymphatics: Unremarkable.  Musculoskeletal: Full ROM all peripheral extremities, joint stability, 5/5 strength and normal gait.  Skin: Warm, dry without exposed rashes, lesions or ecchymosis apparent.  Neuro: Cranial nerves intact, reflexes equal bilaterally. Sensory-motor testing grossly intact. Tendon reflexes grossly intact.  Pysch: Alert & oriented x 3.  Insight and judgement nl & appropriate. No ideations.  Assessment and Plan:   1. Essential hypertension  - CBC with Differential/Platelet - COMPLETE METABOLIC PANEL WITH GFR - Magnesium - TSH  2. Hyperlipidemia associated with type 2 diabetes mellitus (HCC)  - Lipid panel - TSH  3. Type 2 diabetes mellitus with stage 2 chronic kidney disease, without long-term current use of insulin (HCC)  - Hemoglobin A1c - Insulin, random  4. Vitamin D deficiency  - VITAMIN D 25 Hydroxy  5. Medication management  - CBC with Differential/Platelet - COMPLETE METABOLIC PANEL WITH GFR - Magnesium - Lipid panel - TSH - Hemoglobin A1c - Insulin, random - VITAMIN D 25 Hydroxy           Discussed  regular exercise, BP monitoring, weight control to achieve/maintain BMI less than 25 and discussed med  and SE's. Recommended labs to assess and monitor clinical status with further disposition pending results of labs.  I discussed the assessment and treatment plan with the patient. The patient was provided an opportunity to ask questions and all were answered. The patient agreed with the plan and demonstrated an understanding of the instructions.  I provided over 30 minutes of exam, counseling, chart review and  complex critical decision making.   Kirtland Bouchard, MD   C  A  N  C  E  L  L  E  D  A M     of     A P  P 'T  History of Present Illness:  Future Appointments  Date Time Provider Department  09/05/2021 11:30 AM Unk Pinto, MD [facility]  01/10/2022             Wellness  11:00 AM Alycia Rossetti, NP [facility]  06/06/2022              CPE 10:00 AM Alycia Rossetti, NP [facility]         This very nice 72 y.o. married  man presents for 3 month follow up with HTN, HLD, T2_NIDDM and Vitamin D Deficiency.       Patient is treated  for HTN & BP has been controlled at home. Today's BP is at goal - 122/74.  In 2008, patient underwent a CABG and is also followed by Dr Percival Spanish.  Patient has had no complaints of any cardiac type chest pain, palpitations, dyspnea / orthopnea / PND, dizziness, claudication, or dependent edema.      Hyperlipidemia is controlled with diet & Rosuvastatin. Patient denies myalgias or other med SE's. Last Lipids were   Lab Results  Component Value Date   CHOL 79 06/05/2021   HDL 37 (L) 06/05/2021   LDLCALC 23 06/05/2021   TRIG 108 06/05/2021   CHOLHDL 2.1 06/05/2021    Also, the patient has history of T2_NIDDM since 2000 and has had no symptoms of reactive hypoglycemia, diabetic polys, paresthesias or visual blurring.  Last A1c on Metformin, Glimepiride and Jardiance was not at goal.  Lab Results  Component Value Date   HGBA1C 11.3 (H) 06/05/2021           Further, the patient also has history of Vitamin D Deficiency ("37" / 2017)  and supplements vitamin D without any suspected side-effects. Last vitamin D was not at goal:  Lab Results  Component Value Date   VD25OH 62 06/05/2021    Current Outpatient Medications:    aspirin EC 81 MG tablet, Take 81 mg by mouth daily. Swallow whole., Disp: , Rfl:    blood glucose meter kit and supplies, Test sugars once to twice daily, Disp: 1 each, Rfl: 0   Cholecalciferol (VITAMIN D3) 125 MCG (5000 UT) CAPS, Take 5,000 Units by mouth daily., Disp: , Rfl:    co-enzyme Q-10 30 MG capsule, Take 30 mg by mouth 3 (three) times daily., Disp: , Rfl:    Dulaglutide (TRULICITY) 1.5 LJ/4.4BE SOPN, Inject 1.5 mg into the skin once a week., Disp: 6 mL, Rfl: 3   empagliflozin (JARDIANCE) 25 MG TABS tablet, Take 1 tablet (25 mg total) by mouth daily., Disp: 90 tablet, Rfl: 3   lactulose (CHRONULAC) 10 GM/15ML solution, Take 30 mLs (20 g total) by mouth 2 (two) times daily. (Patient not taking: Reported on 06/05/2021), Disp: 236 mL, Rfl: 0   metFORMIN (GLUCOPHAGE)  500 MG tablet, TAKE 2 TABLETS TWICE A DAY WITH MEALS FOR DIABETES, Disp: 360 tablet, Rfl: 3   metoprolol tartrate (LOPRESSOR) 25 MG tablet, TAKE 1 TABLET TWICE A DAY (EVERY 12 HOURS) FOR BP, Disp: 180 tablet, Rfl: 1   Multiple Vitamin (MULTIVITAMIN) tablet, Take 1 tablet by mouth daily., Disp: , Rfl:    rosuvastatin (CRESTOR) 40 MG tablet, TAKE 1 TABLET BY MOUTH EVERY DAY FOR CHOLESTEROL, Disp: 90 tablet, Rfl: 1   telmisartan (MICARDIS) 20 MG tablet, TAKE 1 TABLET BY MOUTH DAILY FOR BLOOD PRESSURE AND KIDNEY PROTECTION, Disp: 90 tablet, Rfl: 3    Allergies  Allergen Reactions   Ace Inhibitors Cough   Statins Other (See  Comments)    Elevated LFT's    PMHx:   Past Medical History:  Diagnosis Date   Acute metabolic encephalopathy 6/73/4193   Anemia    Carotid artery stenosis 01/2017   L-ICA 1-39%, R-ICA 40-59% doppler   Coronary artery disease    s/p CABG in 2008, w/ LIMA-LAD, SVG-Diag, SVG-OM, SVG-PL   Hypertension    Vitamin D deficiency     Immunization History  Administered Date(s) Administered   DT (Pediatric) 02/07/2015   Influenza Whole 12/10/2012   Influenza, High Dose Seasonal PF 02/07/2015, 01/02/2016, 12/13/2016, 01/01/2018, 11/19/2018, 02/21/2020, 01/10/2021   Influenza-Unspecified 12/31/2013   Meningococcal Conjugate 09/03/2016   PFIZER(Purple Top)SARS-COV-2 Vaccination 04/02/2019, 04/23/2019   Pneumococcal Conjugate-13 02/02/2014   Pneumococcal Polysaccharide-23 04/13/2003, 10/08/2016   Td 04/18/2004    Past Surgical History:  Procedure Laterality Date   CORONARY ARTERY BYPASS GRAFT  2008   LIMA to the LAD, saphenous vein graft to diagonal, saphenous vein graft to OM, saphenous vein graft to posterolateral 2008   TOTAL KNEE ARTHROPLASTY Left 12/18/2018   Procedure: TOTAL KNEE ARTHROPLASTY;  Surgeon: Dorna Leitz, MD;  Location: [facility];  Service: Orthopedics;  Laterality: Left;    FHx:    Reviewed / unchanged  SHx:    Reviewed / unchanged   Systems  Review:  Constitutional: Denies fever, chills, wt changes, headaches, insomnia, fatigue, night sweats, change in appetite. Eyes: Denies redness, blurred vision, diplopia, discharge, itchy, watery eyes.  ENT: Denies discharge, congestion, post nasal drip, epistaxis, sore throat, earache, hearing loss, dental pain, tinnitus, vertigo, sinus pain, snoring.  CV: Denies chest pain, palpitations, irregular heartbeat, syncope, dyspnea, diaphoresis, orthopnea, PND, claudication or edema. Respiratory: denies cough, dyspnea, DOE, pleurisy, hoarseness, laryngitis, wheezing.  Gastrointestinal: Denies dysphagia, odynophagia, heartburn, reflux, water brash, abdominal pain or cramps, nausea, vomiting, bloating, diarrhea, constipation, hematemesis, melena, hematochezia  or hemorrhoids. Genitourinary: Denies dysuria, frequency, urgency, nocturia, hesitancy, discharge, hematuria or flank pain. Musculoskeletal: Denies arthralgias, myalgias, stiffness, jt. swelling, pain, limping or strain/sprain.  Skin: Denies pruritus, rash, hives, warts, acne, eczema or change in skin lesion(s). Neuro: No weakness, tremor, incoordination, spasms, paresthesia or pain. Psychiatric: Denies confusion, memory loss or sensory loss. Endo: Denies change in weight, skin or hair change.  Heme/Lymph: No excessive bleeding, bruising or enlarged lymph nodes.  Physical Exam  There were no vitals taken for this visit.  Appears  well nourished, well groomed  and in no distress.  Eyes: PERRLA, EOMs, conjunctiva no swelling or erythema. Sinuses: No frontal/maxillary tenderness ENT/Mouth: EAC's clear, TM's nl w/o erythema, bulging. Nares clear w/o erythema, swelling, exudates. Oropharynx clear without erythema or exudates. Oral hygiene is good. Tongue normal, non obstructing. Hearing intact.  Neck: Supple. Thyroid not palpable. Car 2+/2+ without bruits, nodes or JVD. Chest: Respirations nl with BS clear & equal w/o rales, rhonchi, wheezing or  stridor.  Cor: Heart sounds normal w/ regular rate and rhythm without sig. murmurs, gallops, clicks or rubs. Peripheral pulses normal and equal  without edema.  Abdomen: Soft & bowel sounds normal. Non-tender w/o guarding, rebound, hernias, masses or organomegaly.  Lymphatics: Unremarkable.  Musculoskeletal: Full ROM all peripheral extremities, joint stability, 5/5 strength and normal gait.  Skin: Warm, dry without exposed rashes, lesions or ecchymosis apparent.  Neuro: Cranial nerves intact, reflexes equal bilaterally. Sensory-motor testing grossly intact. Tendon reflexes grossly intact.  Pysch: Alert & oriented x 3.  Insight and judgement nl & appropriate. No ideations.  Assessment and Plan:   1. Essential hypertension  - CBC with Differential/Platelet -  COMPLETE METABOLIC PANEL WITH GFR - Magnesium - TSH  2. Hyperlipidemia associated with type 2 diabetes mellitus (HCC)  - Lipid panel - TSH  3. Type 2 diabetes mellitus with stage 2 chronic kidney disease, without long-term current use of insulin (HCC)  - Hemoglobin A1c - Insulin, random  4. Vitamin D deficiency  - VITAMIN D 25 Hydroxy  5. Medication management  - CBC with Differential/Platelet - COMPLETE METABOLIC PANEL WITH GFR - Magnesium - Lipid panel - TSH - Hemoglobin A1c - Insulin, random - VITAMIN D 25 Hydroxy           Discussed  regular exercise, BP monitoring, weight control to achieve/maintain BMI less than 25 and discussed med and SE's. Recommended labs to assess and monitor clinical status with further disposition pending results of labs.  I discussed the assessment and treatment plan with the patient. The patient was provided an opportunity to ask questions and all were answered. The patient agreed with the plan and demonstrated an understanding of the instructions.  I provided over 30 minutes of exam, counseling, chart review and  complex critical decision making.   Kirtland Bouchard, MD

## 2021-09-18 ENCOUNTER — Encounter: Payer: Self-pay | Admitting: Internal Medicine

## 2021-09-18 ENCOUNTER — Ambulatory Visit (INDEPENDENT_AMBULATORY_CARE_PROVIDER_SITE_OTHER): Payer: Medicare Other | Admitting: Internal Medicine

## 2021-09-18 VITALS — BP 120/70 | HR 56 | Temp 97.6°F | Resp 16 | Ht 61.0 in | Wt 125.2 lb

## 2021-09-18 DIAGNOSIS — N182 Chronic kidney disease, stage 2 (mild): Secondary | ICD-10-CM | POA: Diagnosis not present

## 2021-09-18 DIAGNOSIS — I10 Essential (primary) hypertension: Secondary | ICD-10-CM | POA: Diagnosis not present

## 2021-09-18 DIAGNOSIS — E785 Hyperlipidemia, unspecified: Secondary | ICD-10-CM

## 2021-09-18 DIAGNOSIS — E1122 Type 2 diabetes mellitus with diabetic chronic kidney disease: Secondary | ICD-10-CM | POA: Diagnosis not present

## 2021-09-18 DIAGNOSIS — E559 Vitamin D deficiency, unspecified: Secondary | ICD-10-CM

## 2021-09-18 DIAGNOSIS — Z79899 Other long term (current) drug therapy: Secondary | ICD-10-CM

## 2021-09-18 DIAGNOSIS — E1169 Type 2 diabetes mellitus with other specified complication: Secondary | ICD-10-CM

## 2021-09-18 MED ORDER — TRULICITY 3 MG/0.5ML ~~LOC~~ SOAJ: 3.0000 mg | SUBCUTANEOUS | 0 refills | Status: DC

## 2021-09-19 LAB — CBC WITH DIFFERENTIAL/PLATELET
Absolute Monocytes: 561 cells/uL (ref 200–950)
Basophils Absolute: 41 cells/uL (ref 0–200)
Basophils Relative: 0.7 %
Eosinophils Absolute: 401 cells/uL (ref 15–500)
Eosinophils Relative: 6.8 %
HCT: 41 % (ref 38.5–50.0)
Hemoglobin: 12.2 g/dL — ABNORMAL LOW (ref 13.2–17.1)
Lymphs Abs: 1398 cells/uL (ref 850–3900)
MCH: 20.4 pg — ABNORMAL LOW (ref 27.0–33.0)
MCHC: 29.8 g/dL — ABNORMAL LOW (ref 32.0–36.0)
MCV: 68.4 fL — ABNORMAL LOW (ref 80.0–100.0)
Monocytes Relative: 9.5 %
Neutro Abs: 3499 cells/uL (ref 1500–7800)
Neutrophils Relative %: 59.3 %
Platelets: 305 10*3/uL (ref 140–400)
RBC: 5.99 10*6/uL — ABNORMAL HIGH (ref 4.20–5.80)
RDW: 16.1 % — ABNORMAL HIGH (ref 11.0–15.0)
Total Lymphocyte: 23.7 %
WBC: 5.9 10*3/uL (ref 3.8–10.8)

## 2021-09-19 LAB — COMPLETE METABOLIC PANEL WITH GFR
AG Ratio: 1.6 (calc) (ref 1.0–2.5)
ALT: 60 U/L — ABNORMAL HIGH (ref 9–46)
AST: 41 U/L — ABNORMAL HIGH (ref 10–35)
Albumin: 4 g/dL (ref 3.6–5.1)
Alkaline phosphatase (APISO): 66 U/L (ref 35–144)
BUN: 20 mg/dL (ref 7–25)
CO2: 28 mmol/L (ref 20–32)
Calcium: 9.7 mg/dL (ref 8.6–10.3)
Chloride: 101 mmol/L (ref 98–110)
Creat: 1.25 mg/dL (ref 0.70–1.28)
Globulin: 2.5 g/dL (calc) (ref 1.9–3.7)
Glucose, Bld: 163 mg/dL — ABNORMAL HIGH (ref 65–99)
Potassium: 5.8 mmol/L — ABNORMAL HIGH (ref 3.5–5.3)
Sodium: 136 mmol/L (ref 135–146)
Total Bilirubin: 0.6 mg/dL (ref 0.2–1.2)
Total Protein: 6.5 g/dL (ref 6.1–8.1)
eGFR: 61 mL/min/{1.73_m2} (ref >=60)

## 2021-09-19 LAB — VITAMIN D 25 HYDROXY (VIT D DEFICIENCY, FRACTURES): Vit D, 25-Hydroxy: 139 ng/mL — ABNORMAL HIGH (ref 30–100)

## 2021-09-19 LAB — HEMOGLOBIN A1C
Hgb A1c MFr Bld: 9.5 % of total Hgb — ABNORMAL HIGH (ref <5.7)
Mean Plasma Glucose: 226 mg/dL
eAG (mmol/L): 12.5 mmol/L

## 2021-09-19 LAB — LIPID PANEL
Cholesterol: 78 mg/dL (ref <200)
HDL: 32 mg/dL — ABNORMAL LOW (ref >=40)
LDL Cholesterol (Calc): 28 mg/dL (calc)
Non-HDL Cholesterol (Calc): 46 mg/dL (calc) (ref <130)
Total CHOL/HDL Ratio: 2.4 (calc) (ref <5.0)
Triglycerides: 92 mg/dL (ref <150)

## 2021-09-19 LAB — INSULIN, RANDOM: Insulin: 3.7 u[IU]/mL

## 2021-09-19 LAB — CBC MORPHOLOGY

## 2021-09-19 LAB — TSH: TSH: 2.26 mIU/L (ref 0.40–4.50)

## 2021-09-19 LAB — MAGNESIUM: Magnesium: 1.9 mg/dL (ref 1.5–2.5)

## 2021-09-19 NOTE — Progress Notes (Signed)
<><><><><><><><><><><><><><><><><><><><><><><><><><><><><><><><><> <><><><><><><><><><><><><><><><><><><><><><><><><><><><><><><><><> -   Test results slightly outside the reference range are not unusual. If there is anything important, I will review this with you,  otherwise it is considered normal test values.  If you have further questions,  please do not hesitate to contact me at the office or via My Chart.  <><><><><><><><><><><><><><><><><><><><><><><><><><><><><><><><><> <><><><><><><><><><><><><><><><><><><><><><><><><><><><><><><><><>  -  CBC shows Hgb / Red cell count is stable  <><><><><><><><><><><><><><><><><><><><><><><><><><><><><><><><><>  -  Glucose = 163 - High                       ( Ideal or Goal is less than 120 mg% )   - Liver enzymes are slightly elevated                     ( Will monitor closely at subsequent OV's ) <><><><><><><><><><><><><><><><><><><><><><><><><><><><><><><><><>  -  Total Chol = 78   &  LDL Chol =28 is extremely low,   - So please decrease                  your Rosuvastatin ( Crestor ) 40 mg dose to                                                                     1/2 tablet every other day  <><><><><><><><><><><><><><><><><><><><><><><><><><><><><><><><><> <><><><><><><><><><><><><><><><><><><><><><><><><><><><><><><><><>   -  A1c still too high for Surgery - only down slightly from 11.3% to now 9.5%    <><><><><><><><><><><><><><><><><><><><><><><><><><><><><><><><><> <><><><><><><><><><><><><><><><><><><><><><><><><><><><><><><><><>  - Vitamin D = 130 is way too high  !   PLEASE   STOP for 1 week   Ten may restart, but Only take 1 capsule every other day                                              <><><><><><><><><><><><><><><><><><><><><><><><><><><><><><><><><> <><><><><><><><><><><><><><><><><><><><><><><><><><><><><><><><><>  -  Potassium is slightly elevated, So avoid Salt substitutes with high level potassium  As " Light  Salt "  or   " No Salt " <><><><><><><><><><><><><><><><><><><><><><><><><><><><><><><><><> <><><><><><><><><><><><><><><><><><><><><><><><><><><><><><><><><>

## 2021-10-06 IMAGING — CR DG CHEST 2V
2 series · 2 of 2 positions shown · non-contrast
Comparison: 04/17/2020

CLINICAL DATA: Productive cough

EXAM:
CHEST - 2 VIEW

[w chest pa]
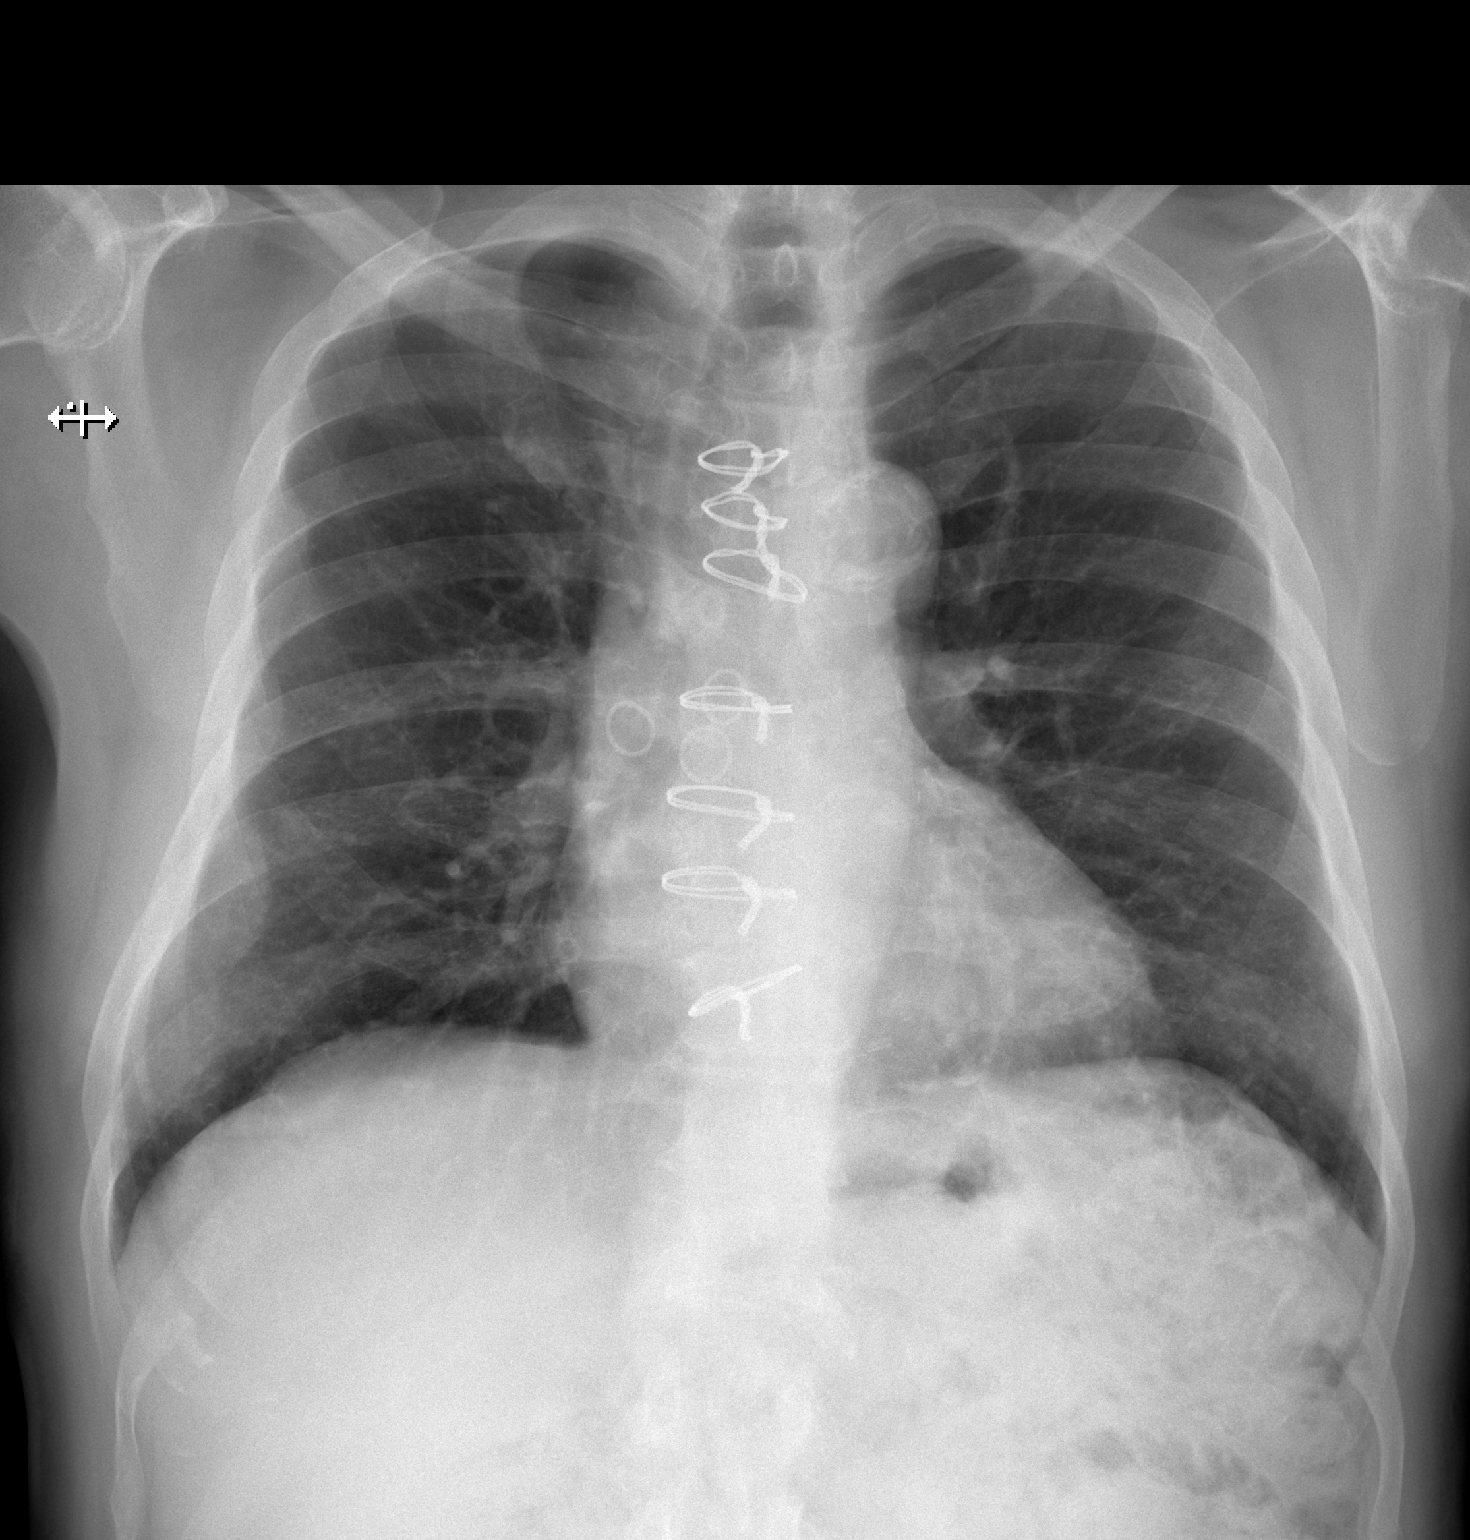

[w chest lat]
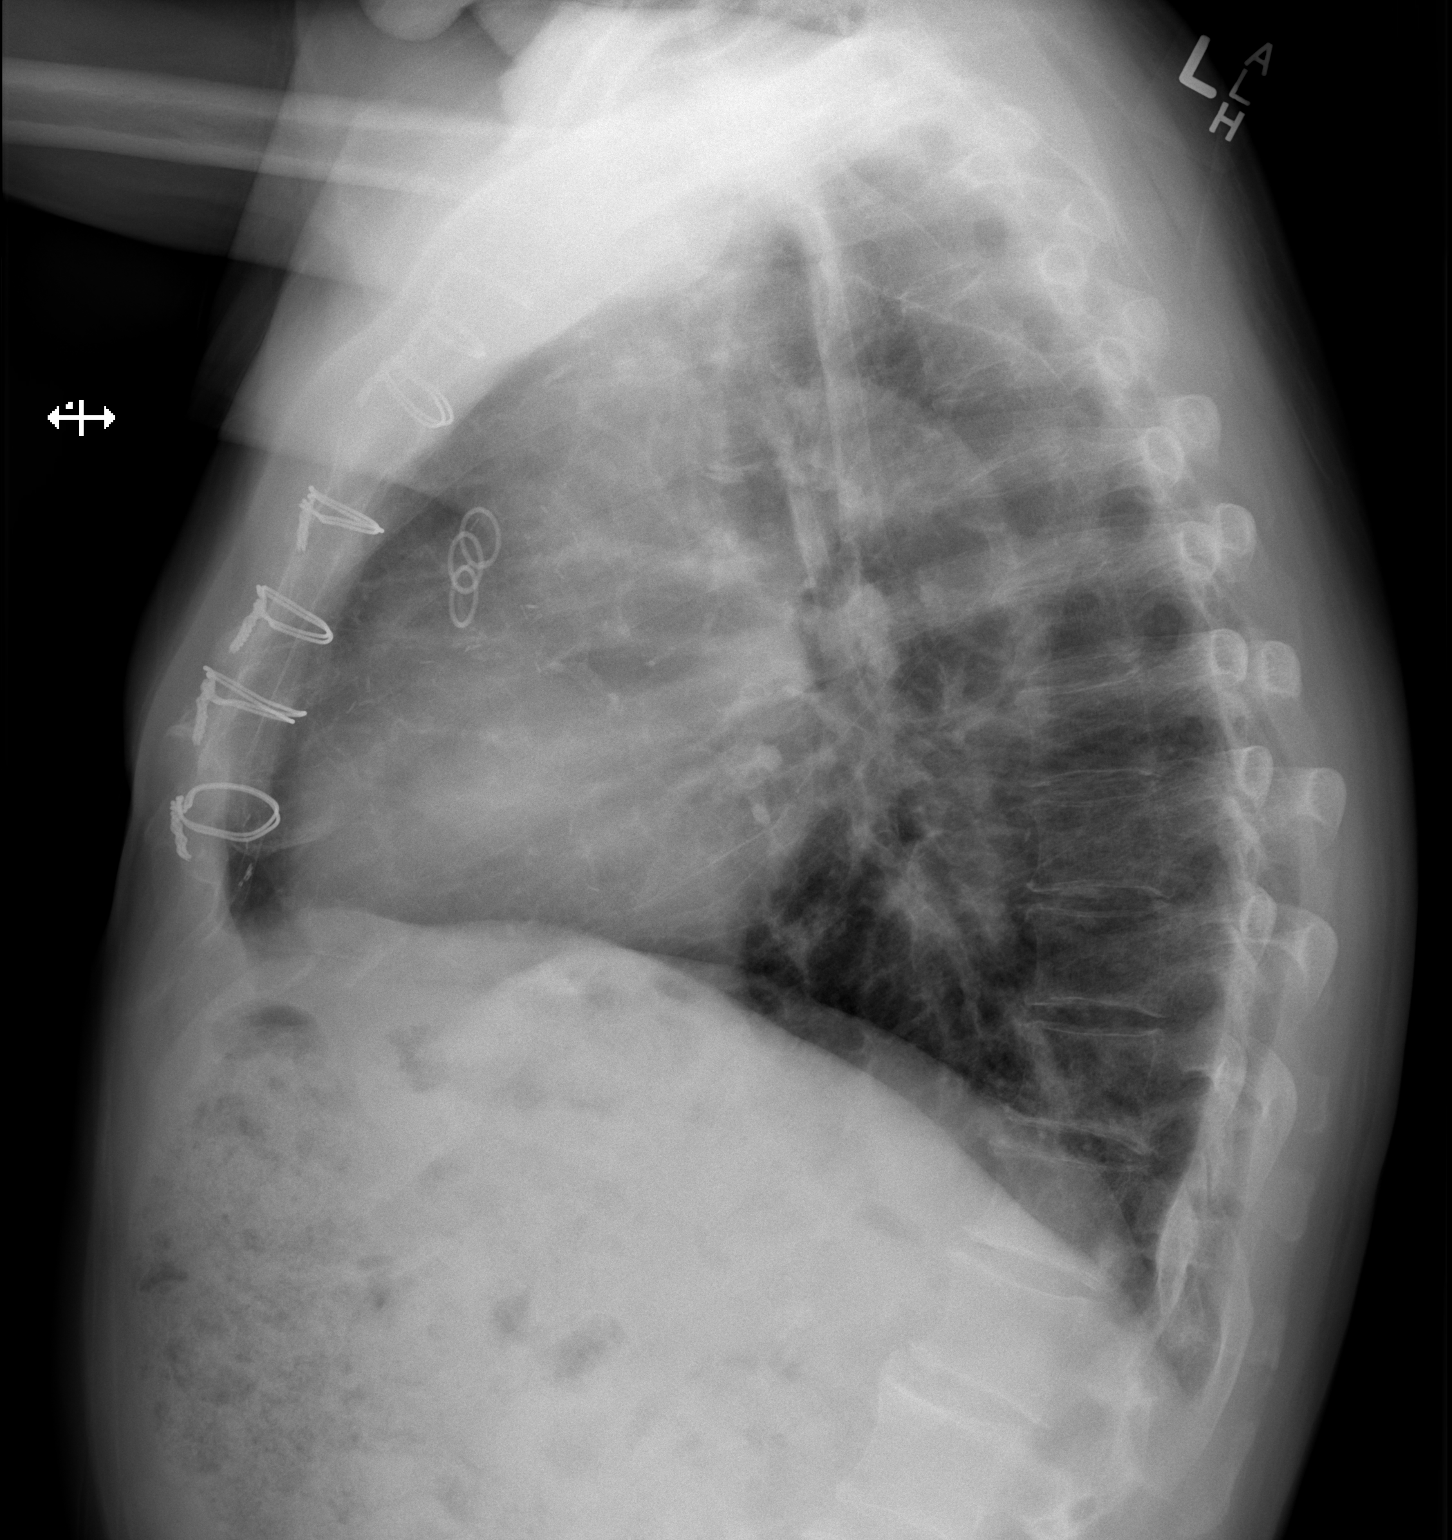

[2 of 2 positions shown; findings below may reference images not displayed]

FINDINGS: Cardiac shadow is within normal limits. Postsurgical changes are
seen. Aortic calcifications are noted. The lungs are clear. Healing
fractures with callus are noted on the right involving the fourth
through seventh ribs.
IMPRESSION: No acute abnormality noted.

Multiple healing rib fractures on the right.

## 2021-10-30 ENCOUNTER — Encounter: Payer: Self-pay | Admitting: Nurse Practitioner

## 2021-10-30 ENCOUNTER — Ambulatory Visit
Admission: RE | Admit: 2021-10-30 | Discharge: 2021-10-30 | Disposition: A | Discharge status: Home / Self Care | Payer: Medicare Other | Source: Ambulatory Visit | Attending: Nurse Practitioner | Admitting: Nurse Practitioner

## 2021-10-30 ENCOUNTER — Ambulatory Visit (INDEPENDENT_AMBULATORY_CARE_PROVIDER_SITE_OTHER): Payer: Medicare Other | Admitting: Nurse Practitioner

## 2021-10-30 VITALS — BP 108/62 | HR 87 | Temp 97.5°F | Ht 61.0 in | Wt 118.0 lb

## 2021-10-30 DIAGNOSIS — R198 Other specified symptoms and signs involving the digestive system and abdomen: Secondary | ICD-10-CM

## 2021-10-30 DIAGNOSIS — Z79899 Other long term (current) drug therapy: Secondary | ICD-10-CM | POA: Diagnosis not present

## 2021-10-30 DIAGNOSIS — R1031 Right lower quadrant pain: Secondary | ICD-10-CM | POA: Diagnosis not present

## 2021-10-30 DIAGNOSIS — E1122 Type 2 diabetes mellitus with diabetic chronic kidney disease: Secondary | ICD-10-CM

## 2021-10-30 DIAGNOSIS — R1030 Lower abdominal pain, unspecified: Secondary | ICD-10-CM | POA: Diagnosis not present

## 2021-10-30 DIAGNOSIS — K5903 Drug induced constipation: Secondary | ICD-10-CM

## 2021-10-30 DIAGNOSIS — R110 Nausea: Secondary | ICD-10-CM

## 2021-10-30 DIAGNOSIS — R1032 Left lower quadrant pain: Secondary | ICD-10-CM

## 2021-10-30 DIAGNOSIS — N182 Chronic kidney disease, stage 2 (mild): Secondary | ICD-10-CM | POA: Diagnosis not present

## 2021-10-30 DIAGNOSIS — I10 Essential (primary) hypertension: Secondary | ICD-10-CM

## 2021-10-30 MED ORDER — METOPROLOL TARTRATE 25 MG PO TABS: ORAL_TABLET | ORAL | 1 refills | Status: DC

## 2021-10-30 NOTE — Patient Instructions (Signed)
Dulaglutide Injection What is this medication? DULAGLUTIDE (DOO la GLOO tide) treats type 2 diabetes. It works by increasing insulin levels in your body, which decreases your blood sugar (glucose). It also reduces the amount of sugar released into your blood and slows down your digestion. It can also be used to lower the risk of heart attack and stroke in people with type 2 diabetes. Changes to diet and exercise are often combined with this medication. This medicine may be used for other purposes; ask your health care provider or pharmacist if you have questions. COMMON BRAND NAME(S): Trulicity What should I tell my care team before I take this medication? They need to know if you have any of these conditions: Endocrine tumors (MEN 2) or if someone in your family had these tumors Eye disease, vision problems History of pancreatitis Kidney disease Liver disease Stomach or intestine problems Thyroid cancer or if someone in your family had thyroid cancer An unusual or allergic reaction to dulaglutide, other medications, foods, dyes, or preservatives Pregnant or trying to get pregnant Breast-feeding How should I use this medication? This medication is injected under the skin. You will be taught how to prepare and give it. Take it as directed on the prescription label on the same day of each week. Do NOT prime the pen. Keep taking it unless your care team tells you to stop. If you use this medication with insulin, you should inject this medication and the insulin separately. Do not mix them together. Do not give the injections right next to each other. Change (rotate) injection sites with each injection. This medication comes with INSTRUCTIONS FOR USE. Ask your pharmacist for directions on how to use this medication. Read the information carefully. Talk to your pharmacist or care team if you have questions. It is important that you put your used needles and syringes in a special sharps container. Do  not put them in a trash can. If you do not have a sharps container, call your pharmacist or care team to get one. A special MedGuide will be given to you by the pharmacist with each prescription and refill. Be sure to read this information carefully each time. Talk to your care team about the use of this medication in children. While it may be prescribed for children as young as 10 years for selected conditions, precautions do apply. Overdosage: If you think you have taken too much of this medicine contact a poison control center or emergency room at once. NOTE: This medicine is only for you. Do not share this medicine with others. What if I miss a dose? If you miss a dose, take it as soon as you can unless it is more than 3 days late. If it is more than 3 days late, skip the missed dose. Take the next dose at the normal time. What may interact with this medication? Other medications for diabetes Many medications may cause changes in blood sugar, these include: Alcohol containing beverages Antiviral medications for HIV or AIDS Aspirin and aspirin-like medications Certain medications for blood pressure, heart disease, irregular heart beat Chromium Diuretics Male hormones, such as estrogens or progestins, birth control pills Fenofibrate Gemfibrozil Isoniazid Lanreotide Male hormones or anabolic steroids MAOIs like Carbex, Eldepryl, Marplan, Nardil, and Parnate Medications for allergies, asthma, cold, or cough Medications for depression, anxiety, or psychotic disturbances Medications for weight loss Niacin Nicotine NSAIDs, medications for pain and inflammation, like ibuprofen or naproxen Octreotide Pasireotide Pentamidine Phenytoin Probenecid Quinolone antibiotics such as ciprofloxacin, levofloxacin,  ofloxacin Some herbal dietary supplements Steroid medications such as prednisone or cortisone Sulfamethoxazole; trimethoprim Thyroid hormones Some medications can hide the warning  symptoms of low blood sugar (hypoglycemia). You may need to monitor your blood sugar more closely if you are taking one of these medications. These include: Beta-blockers, often used for high blood pressure or heart problems (examples include atenolol, metoprolol, propranolol) Clonidine Guanethidine Reserpine This list may not describe all possible interactions. Give your health care provider a list of all the medicines, herbs, non-prescription drugs, or dietary supplements you use. Also tell them if you smoke, drink alcohol, or use illegal drugs. Some items may interact with your medicine. What should I watch for while using this medication? Visit your care team for regular checks on your progress. Check with your care team if you have severe diarrhea, nausea, and vomiting, or if you sweat a lot. The loss of too much body fluid may make it dangerous for you to take this medication. A test called the HbA1C (A1C) will be monitored. This is a simple blood test. It measures your blood sugar control over the last 2 to 3 months. You will receive this test every 3 to 6 months. Learn how to check your blood sugar. Learn the symptoms of low and high blood sugar and how to manage them. Always carry a quick-source of sugar with you in case you have symptoms of low blood sugar. Examples include hard sugar candy or glucose tablets. Make sure others know that you can choke if you eat or drink when you develop serious symptoms of low blood sugar, such as seizures or unconsciousness. Get medical help at once. Tell your care team if you have high blood sugar. You might need to change the dose of your medication. If you are sick or exercising more than usual, you may need to change the dose of your medication. Do not skip meals. Ask your care team if you should avoid alcohol. Many nonprescription cough and cold products contain sugar or alcohol. These can affect blood sugar. Pens should never be shared. Even if the  needle is changed, sharing may result in passing of viruses like hepatitis or HIV. Wear a medical ID bracelet or chain. Carry a card that describes your condition. List the medications and doses you take on the card. What side effects may I notice from receiving this medication? Side effects that you should report to your care team as soon as possible: Allergic reactions--skin rash, itching, hives, swelling of the face, lips, tongue, or throat Change in vision Dehydration--increased thirst, dry mouth, feeling faint or lightheaded, headache, dark yellow or brown urine Kidney injury--decrease in the amount of urine, swelling of the ankles, hands, or feet Pancreatitis--severe stomach pain that spreads to your back or gets worse after eating or when touched, fever, nausea, vomiting Thyroid cancer--new mass or lump in the neck, pain or trouble swallowing, trouble breathing, hoarseness Side effects that usually do not require medical attention (report to your care team if they continue or are bothersome): Diarrhea Loss of appetite Nausea Stomach pain Vomiting This list may not describe all possible side effects. Call your doctor for medical advice about side effects. You may report side effects to FDA at 1-800-FDA-1088. Where should I keep my medication? Keep out of the reach of children and pets. Refrigeration (preferred): Store unopened pens in a refrigerator between 2 and 8 degrees C (36 and 46 degrees F). Keep it in the original carton until you are ready to  take it. Do not freeze or use if the medication has been frozen. Protect from light. Get rid of any unused medication after the expiration date on the label. Room Temperature: The pen may be stored at room temperature below 30 degrees C (86 degrees F) for up to a total of 14 days if needed. Protect from light. Avoid exposure to extreme heat. If it is stored at room temperature, throw away any unused medication after 14 days or after it expires,  whichever is first. To get rid of medications that are no longer needed or have expired: Take the medication to a medication take-back program. Check with your pharmacy or law enforcement to find a location. If you cannot return the medication, ask your pharmacist or care team how to get rid of this medication safely. NOTE: This sheet is a summary. It may not cover all possible information. If you have questions about this medicine, talk to your doctor, pharmacist, or health care provider.  2023 Elsevier/Gold Standard (2012-11-27 00:00:00)

## 2021-10-30 NOTE — Progress Notes (Signed)
Assessment and Plan:  William Haney was seen today for an episodic visit.  Diagnoses and all order for this visit:  1. Right lower quadrant abdominal pain  - DG Abd 2 Views; Future  2. Left lower quadrant abdominal pain  - DG Abd 2 Views; Future  3. Nausea  - DG Abd 2 Views; Future  4. Change in bowel function Discussed reducing dose of Trulicity back to 5.62 mg weekly or stopping based on results of abdominal imaging.  - DG Abd 2 Views; Future  5. Drug-induced constipation Discussed in full SE of Trulicity. Remain well hydrated. Stool softener PRN if constipation increases. Remain active.  - DG Abd 2 Views; Future  6. Type 2 diabetes mellitus with stage 2 chronic kidney disease, without long-term current use of insulin (HCC) Continue Metformin and Jardiance. Discussed possible discontinuation of Trulicity or decreasing dose pending results of abdominal x-ray.  Continue to monitor BG.   7. Medication management All medications discussed and reviewed in full. All questions and concerns regarding medications addressed.    8.  Hypertension. Below average goal today. Asymptomatic. Continue medications as directed at this time. Metoprolol refilled. Discussed if weight loss continues, may need to reassess current medication regimen.   Orders Placed This Encounter  Procedures   DG Abd 2 Views    Standing Status:   Future    Standing Expiration Date:   10/31/2022    Order Specific Question:   Reason for Exam (SYMPTOM  OR DIAGNOSIS REQUIRED)    Answer:   lower abdominal pain, RLQ and LLQ tenderness to palpation, change in bristol stool/increase in constipation, nausea    Comments:   increased dose of Dulaglutide x3 weeks ago.    Order Specific Question:   Preferred imaging location?    Answer:   [id number]   Meds ordered this encounter  Medications   metoprolol tartrate (LOPRESSOR) 25 MG tablet    Sig: TAKE 1 TABLET TWICE A DAY (EVERY 12 HOURS) FOR BP     Dispense:  180 tablet    Refill:  1    Continue to monitor for any increase in fever, chills, N/V, diarrhea, changes to bowel habits, blood in stool.  Notify office for further evaluation and treatment, questions or concerns if s/s fail to improve. The risks and benefits of my recommendations, as well as other treatment options were discussed with the patient today. Questions were answered.  Further disposition pending results of labs. Discussed med's effects and SE's.    Over 20 minutes of exam, counseling, chart review, and critical decision making was performed.   Future Appointments  Date Time Provider [place]  01/10/2022 11:00 AM Alycia Rossetti, NP [facility] None  06/06/2022 10:00 AM Alycia Rossetti, NP [facility] None    ------------------------------------------------------------------------------------------------------------------   HPI BP 108/62   Pulse 87   Temp (!) 97.5 F (36.4 C)   Ht $R'5\' 1"'XS$  (1.549 m)   Wt 118 lb (53.5 kg)   SpO2 96%   BMI 22.30 kg/m   71 y.o.male presents for evaluation of right and left lower abdominal cramping onset 3 weeks ago.  During this time he increased his dose of Trulicity from 5.63 mg weekly (every Tuesday) to 1.5 mg weekly.  Associated decline in bowel movements, although still going daily, stool have become harder.  Increase in gas and bloating with nausea.  His appetite has diminished.  He has lost 7 lb over the last 6 weeks.  Denies vomiting. Last BM yesterday,  once a day, decreased from BID.  He has not had a BM today.  BP is also low today. Asymptomatic.  He is not been taking in food or fluids as usual as he does not feel hungry.  He is continuing to take medications as directed.  Past Medical History:  Diagnosis Date   Acute metabolic encephalopathy 2/59/5638   Anemia    Carotid artery stenosis 01/2017   L-ICA 1-39%, R-ICA 40-59% doppler   Coronary artery disease    s/p CABG in 2008, w/ LIMA-LAD, SVG-Diag,  SVG-OM, SVG-PL   Hypertension    Vitamin D deficiency      Allergies  Allergen Reactions   Ace Inhibitors Cough   Statins Other (See Comments)    Elevated LFT's    Current Outpatient Medications on File Prior to Visit  Medication Sig   aspirin EC 81 MG tablet Take 81 mg by mouth daily. Swallow whole.   blood glucose meter kit and supplies Test sugars once to twice daily   Cholecalciferol (VITAMIN D3) 125 MCG (5000 UT) CAPS Take 5,000 Units by mouth daily.   co-enzyme Q-10 30 MG capsule Take 30 mg by mouth 3 (three) times daily.   Dulaglutide (TRULICITY) 3 VF/6.4PP SOPN Inject 3 mg as directed once a week.   empagliflozin (JARDIANCE) 25 MG TABS tablet Take 1 tablet (25 mg total) by mouth daily.   metFORMIN (GLUCOPHAGE) 500 MG tablet TAKE 2 TABLETS TWICE A DAY WITH MEALS FOR DIABETES   metoprolol tartrate (LOPRESSOR) 25 MG tablet TAKE 1 TABLET TWICE A DAY (EVERY 12 HOURS) FOR BP   Multiple Vitamin (MULTIVITAMIN) tablet Take 1 tablet by mouth daily.   rosuvastatin (CRESTOR) 40 MG tablet TAKE 1 TABLET BY MOUTH EVERY DAY FOR CHOLESTEROL   telmisartan (MICARDIS) 20 MG tablet TAKE 1 TABLET BY MOUTH DAILY FOR BLOOD PRESSURE AND KIDNEY PROTECTION   No current facility-administered medications on file prior to visit.    ROS: all negative except what is noted in the HPI.   Physical Exam:  BP 108/62   Pulse 87   Temp (!) 97.5 F (36.4 C)   Ht $R'5\' 1"'yQ$  (1.549 m)   Wt 118 lb (53.5 kg)   SpO2 96%   BMI 22.30 kg/m   BMI is Body mass index is 22.3 kg/m., he has not been working on diet and exercise.  Has lost 7 lb.  Wt Readings from Last 3 Encounters:  10/30/21 118 lb (53.5 kg)  09/18/21 125 lb 3.2 oz (56.8 kg)  06/11/21 121 lb 12.8 oz (55.2 kg)   General Appearance: NAD.  Awake, conversant and cooperative. Eyes: PERRLA, EOMs intact.  Sclera white.  Conjunctiva without erythema. Sinuses: No frontal/maxillary tenderness.  No nasal discharge. Nares patent.  ENT/Mouth: Ext aud canals  clear.  Bilateral TMs w/DOL and without erythema or bulging. Hearing intact.  Posterior pharynx without swelling or exudate.  Tonsils without swelling or erythema.  Neck: Supple.  No masses, nodules or thyromegaly. Respiratory: Effort is regular with non-labored breathing. Breath sounds are equal bilaterally without rales, rhonchi, wheezing or stridor.  Cardio: RRR with no MRGs. Brisk peripheral pulses without edema.  Abdomen: RLQ and LLQ with tenderness to palpation. Active BS in all four quadrants.  No hernias or masses. Lymphatics: Non tender without lymphadenopathy.  Musculoskeletal: Full ROM, 5/5 strength, normal ambulation.  No clubbing or cyanosis. Skin: Appropriate color for ethnicity. Warm without rashes, lesions, ecchymosis, ulcers.  Neuro: CN II-XII grossly normal. Normal muscle tone without cerebellar symptoms  and intact sensation.   Psych: AO X 3,  appropriate mood and affect, insight and judgment.     Darrol Jump, NP 11:29 AM [facility]

## 2021-12-07 ENCOUNTER — Encounter: Payer: Self-pay | Admitting: Cardiology

## 2021-12-10 ENCOUNTER — Other Ambulatory Visit: Payer: Self-pay

## 2021-12-10 DIAGNOSIS — E1122 Type 2 diabetes mellitus with diabetic chronic kidney disease: Secondary | ICD-10-CM

## 2021-12-10 MED ORDER — TRULICITY 3 MG/0.5ML ~~LOC~~ SOAJ: 3.0000 mg | SUBCUTANEOUS | 0 refills | Status: DC

## 2021-12-11 ENCOUNTER — Other Ambulatory Visit: Payer: Self-pay

## 2021-12-11 DIAGNOSIS — E1122 Type 2 diabetes mellitus with diabetic chronic kidney disease: Secondary | ICD-10-CM

## 2021-12-11 MED ORDER — TRULICITY 3 MG/0.5ML ~~LOC~~ SOAJ: SUBCUTANEOUS | 2 refills | Status: DC

## 2021-12-17 NOTE — Progress Notes (Unsigned)
Cardiology Office Note   Date:  12/17/2021   ID:  William Haney, DOB 01-10-1951, MRN [id number]  PCP:  Unk Pinto, MD  Cardiologist:   Minus Breeding, MD   No chief complaint on file.     History of Present Illness: William Haney is a 71 y.o. male who presents for follow up of CAD.  The patient has had CABG as below.    Since I last saw him ***   ***   he has done very well.  He pushes a lawnmower.  He does other extensive yard work.  He gets none of the shortness of breath that was his previous anginal equivalent. The patient denies any new symptoms such as chest discomfort, neck or arm discomfort. There has been no new shortness of breath, PND or orthopnea. There have been no reported palpitations, presyncope or syncope.     Past Medical History:  Diagnosis Date   Acute metabolic encephalopathy 09/04/348   Anemia    Carotid artery stenosis 01/2017   L-ICA 1-39%, R-ICA 40-59% doppler   Coronary artery disease    s/p CABG in 2008, w/ LIMA-LAD, SVG-Diag, SVG-OM, SVG-PL   Hypertension    Vitamin D deficiency     Past Surgical History:  Procedure Laterality Date   CORONARY ARTERY BYPASS GRAFT  2008   LIMA to the LAD, saphenous vein graft to diagonal, saphenous vein graft to OM, saphenous vein graft to posterolateral 2008   TOTAL KNEE ARTHROPLASTY Left 12/18/2018   Procedure: TOTAL KNEE ARTHROPLASTY;  Surgeon: Dorna Leitz, MD;  Location: [facility];  Service: Orthopedics;  Laterality: Left;     Current Outpatient Medications  Medication Sig Dispense Refill   aspirin EC 81 MG tablet Take 81 mg by mouth daily. Swallow whole.     blood glucose meter kit and supplies Test sugars once to twice daily 1 each 0   Cholecalciferol (VITAMIN D3) 125 MCG (5000 UT) CAPS Take 5,000 Units by mouth daily.     co-enzyme Q-10 30 MG capsule Take 30 mg by mouth 3 (three) times daily.     Dulaglutide (TRULICITY) 3 KX/3.8HW SOPN Inject into skin once weekly 6 mL 2   empagliflozin  (JARDIANCE) 25 MG TABS tablet Take 1 tablet (25 mg total) by mouth daily. 90 tablet 3   metFORMIN (GLUCOPHAGE) 500 MG tablet TAKE 2 TABLETS TWICE A DAY WITH MEALS FOR DIABETES 360 tablet 3   metoprolol tartrate (LOPRESSOR) 25 MG tablet TAKE 1 TABLET TWICE A DAY (EVERY 12 HOURS) FOR BP 180 tablet 1   Multiple Vitamin (MULTIVITAMIN) tablet Take 1 tablet by mouth daily.     rosuvastatin (CRESTOR) 40 MG tablet TAKE 1 TABLET BY MOUTH EVERY DAY FOR CHOLESTEROL 90 tablet 1   telmisartan (MICARDIS) 20 MG tablet TAKE 1 TABLET BY MOUTH DAILY FOR BLOOD PRESSURE AND KIDNEY PROTECTION 90 tablet 3   No current facility-administered medications for this visit.    Allergies:   Ace inhibitors and Statins    ROS:  Please see the history of present illness.   Otherwise, review of systems are positive for ***.   All other systems are reviewed and negative.    PHYSICAL EXAM: VS:  There were no vitals taken for this visit. , BMI There is no height or weight on file to calculate BMI. GENERAL:  Well appearing NECK:  No jugular venous distention, waveform within normal limits, carotid upstroke brisk and symmetric, no bruits, no thyromegaly LUNGS:  Clear to auscultation bilaterally  CHEST:  Well healed sternotomy scar. HEART:  PMI not displaced or sustained,S1 and S2 within normal limits, no S3, no S4, no clicks, no rubs, *** murmurs ABD:  Flat, positive bowel sounds normal in frequency in pitch, no bruits, no rebound, no guarding, no midline pulsatile mass, no hepatomegaly, no splenomegaly EXT:  2 plus pulses throughout, no edema, no cyanosis no clubbing     ***GENERAL:  Well appearing NECK:  No jugular venous distention, waveform within normal limits, carotid upstroke brisk and symmetric, no bruits, no thyromegaly LUNGS:  Clear to auscultation bilaterally CHEST:  Well healed sternotomy scar. HEART:  PMI not displaced or sustained,S1 and S2 within normal limits, no S3, no S4, no clicks, no rubs, no  murmurs ABD:  Flat, positive bowel sounds normal in frequency in pitch, no bruits, no rebound, no guarding, no midline pulsatile mass, no hepatomegaly, no splenomegaly EXT:  2 plus pulses throughout, no edema, no cyanosis no clubbing      EKG:  EKG is  *** ordered today. The ekg ordered today demonstrates sinus rhythm, rate *** axis within normal limits, early transition lead V2, no acute ST-T wave changes.  Please note that the gain was 2.5 mm/mV.  Voltage is not low.   Recent Labs: 09/18/2021: ALT 60; BUN 20; Creat 1.25; Hemoglobin 12.2; Magnesium 1.9; Platelets 305; Potassium 5.8; Sodium 136; TSH 2.26    Lipid Panel    Component Value Date/Time   CHOL 78 09/18/2021 1029   TRIG 92 09/18/2021 1029   HDL 32 (L) 09/18/2021 1029   CHOLHDL 2.4 09/18/2021 1029   VLDL 31 06/09/2020 0335   LDLCALC 28 09/18/2021 1029      Wt Readings from Last 3 Encounters:  10/30/21 118 lb (53.5 kg)  09/18/21 125 lb 3.2 oz (56.8 kg)  06/11/21 121 lb 12.8 oz (55.2 kg)      Other studies Reviewed: Additional studies/ records that were reviewed today include: ***. Review of the above records demonstrates:  Please see elsewhere in the note.     ASSESSMENT AND PLAN:  CAD/CABG:   ***  The patient has no new sypmtoms.  No further cardiovascular testing is indicated.  We will continue with aggressive risk reduction and meds as listed.  DIABETES: His A1c is *** 8.3.  This was down from 10.3 most recently.  He is going to follow-up with Unk Pinto, MD to discuss this.  We reviewed this extensively today.  He has a good diet.   DYSLIPIDEMIA: His lipids are *** 95 LDL and HDL of 28.  He agrees to take Crestor 40 and get a lipid and liver profile in 3 months.   CAROTID STENOSIS:   He had 40 to 59% stenosis in March of this year.  ***    We will follow this again in 1 year.   Current medicines are reviewed at length with the patient today.  The patient does not have concerns regarding  medicines.  The following changes have been made:   ***  Labs/ tests ordered today include:   ***   No orders of the defined types were placed in this encounter.    Disposition:   Follow up with ***    Signed, Minus Breeding, MD  12/17/2021 8:33 AM    [place]

## 2021-12-18 ENCOUNTER — Ambulatory Visit: Payer: Medicare Other | Attending: Cardiology | Admitting: Cardiology

## 2021-12-18 ENCOUNTER — Encounter: Payer: Self-pay | Admitting: Cardiology

## 2021-12-18 VITALS — BP 98/50 | HR 70 | Ht 60.0 in | Wt 119.4 lb

## 2021-12-18 DIAGNOSIS — I2510 Atherosclerotic heart disease of native coronary artery without angina pectoris: Secondary | ICD-10-CM

## 2021-12-18 DIAGNOSIS — E118 Type 2 diabetes mellitus with unspecified complications: Secondary | ICD-10-CM

## 2021-12-18 DIAGNOSIS — E785 Hyperlipidemia, unspecified: Secondary | ICD-10-CM

## 2021-12-18 MED ORDER — ROSUVASTATIN CALCIUM 20 MG PO TABS: 20.0000 mg | ORAL_TABLET | Freq: Every day | ORAL | 3 refills | Status: DC

## 2021-12-18 NOTE — Patient Instructions (Signed)
Medication Instructions:   REDUCE ROSUVASTATIN TO 20 MG ONCE DAILY= 1/2 OF THE 40 MG TABLET ONCE DAILY  *If you need a refill on your cardiac medications before your next appointment, please call your pharmacy*   Lab Work:  Your physician recommends that you return for lab work in: 3 MONTHS-FASTING  If you have labs (blood work) drawn today and your tests are completely normal, you will receive your results only by: [place] (if you have MyChart) OR A paper copy in the mail If you have any lab test that is abnormal or we need to change your treatment, we will call you to review the results.   Testing/Procedures:  Your physician has requested that you have a carotid duplex. This test is an ultrasound of the carotid arteries in your neck. It looks at blood flow through these arteries that supply the brain with blood. Allow one hour for this exam. There are no restrictions or special instructions. [facility] IN [place]    Follow-Up: At [facility], you and your health needs are our priority.  As part of our continuing mission to provide you with exceptional heart care, we have created designated Provider Care Teams.  These Care Teams include your primary Cardiologist (physician) and Advanced Practice Providers (APPs -  Physician Assistants and Nurse Practitioners) who all work together to provide you with the care you need, when you need it.  We recommend signing up for the patient portal called "MyChart".  Sign up information is provided on this After Visit Summary.  MyChart is used to connect with patients for Virtual Visits (Telemedicine).  Patients are able to view lab/test results, encounter notes, upcoming appointments, etc.  Non-urgent messages can be sent to your provider as well.   To learn more about what you can do with MyChart, go to [email or web address].    Your next appointment:   12 month(s)  The format for your next appointment:   In  Person  Provider:   Minus Breeding, MD

## 2022-01-07 ENCOUNTER — Other Ambulatory Visit: Payer: Self-pay | Admitting: Nurse Practitioner

## 2022-01-07 DIAGNOSIS — E1169 Type 2 diabetes mellitus with other specified complication: Secondary | ICD-10-CM

## 2022-01-07 MED ORDER — EMPAGLIFLOZIN 25 MG PO TABS: 25.0000 mg | ORAL_TABLET | Freq: Every day | ORAL | 3 refills | Status: DC

## 2022-01-09 NOTE — Progress Notes (Unsigned)
MEDICARE WELLNESS AND FOLLOW UP Assessment:  Encounter for Medicare Annual Wellness Exam Due Yearly  Essential hypertension - continue medications, DASH diet, exercise and monitor at home. Call if greater than 130/80.  -     CBC with Differential/Platelet -     CMP/GFR -     TSH  Atherosclerosis of native coronary artery of native heart without angina pectoris Control blood pressure, cholesterol, glucose, increase exercise.  -     Lipid panel -     Hemoglobin A1c  Stenosis of carotid artery, unspecified laterality Control blood pressure, cholesterol, glucose, increase exercise.  Followed by cardiology; moderate disease -     Lipid panel -     Hemoglobin A1c  Type 2 diabetes mellitus with other circulatory complication, without long-term current use of insulin (HCC) On metformin, glimepiride,  jardiance Discussed general issues about diabetes pathophysiology and management., Educational material distributed., Suggested low cholesterol diet., Encouraged aerobic exercise., Discussed foot care., Reminded to get yearly retinal exam. -     Hemoglobin A1c  Diabetic polyneuropathy associated with type 2 diabetes mellitus (HCC) Intermittent burning in bilateral feet, none recent, declines medications Control sugars, check feet daily -     Hemoglobin A1c  Type 2 diabetes mellitus with stage 2 chronic kidney disease, without long-term current use of insulin ([place]) Discussed general issues about diabetes pathophysiology and management., Educational material distributed., Suggested low cholesterol diet., Encouraged aerobic exercise., Discussed foot care., Reminded to get yearly retinal exam- report for this year in system -     Hemoglobin A1c  Type 2 diabetes mellitus with diabetic retinopathy(HCC) Control sugars, followed by Ophthalmology  Hyperlipidemia associated with Type 2 DM(HCC) -continue medications, check lipids, decrease fatty foods, increase activity.  -     Lipid  panel  Anemia, beta thalassemia minor trait -     CBC with Differential/Platelet  Medication management -     Magnesium  Flu Vaccine Need High Dose Flu Vaccine Given  Vitamin D deficiency Continue vitamin D supplement  Former smoker, 34 pack year history, quit 2007 -CXR 06/08/20- will repeat in 1 year  Future Appointments  Date Time Provider [place]  01/10/2022 11:00 AM Alycia Rossetti, NP [facility] None  04/22/2022  9:20 AM [facility] 2 [facility]  06/06/2022 10:00 AM Alycia Rossetti, NP [facility] None  09/24/2022 11:00 AM Alycia Rossetti, NP [facility] None    Plan:   During the course of the visit the patient was educated and counseled about appropriate screening and preventive services including:   Pneumococcal vaccine  Prevnar 13 Influenza vaccine Td vaccine Screening electrocardiogram Bone densitometry screening Colorectal cancer screening Diabetes screening Glaucoma screening Nutrition counseling  Advanced directives: requested   Subjective:  Kamau Weatherall is a 71 y.o. male who presents for medicare wellness and 3 month follow up for HTN, hyperlipidemia, diabetes, and vitamin D Def.    Right knee continues to cause pain, is to have replacement in the near future- trying to get A1c down.   BMI is There is no height or weight on file to calculate BMI., he has been working on diet and exercise. Gets on stationary cycle 20-25 min daily.  Wt Readings from Last 3 Encounters:  12/18/21 119 lb 6.4 oz (54.2 kg)  10/30/21 118 lb (53.5 kg)  09/18/21 125 lb 3.2 oz (56.8 kg)   He has a history of CAD s/p CABG in 2008, follows with Dr. Percival Spanish. Lab Results  Component Value Date   IRON 62  12/10/2019   TIBC 365 12/10/2019   FERRITIN 38 02/26/2018   Lab Results  Component Value Date   VITAMINB12 334 06/08/2020    Also follows for moderate bilateral carotid stenosis getting annual carotid US He has a history of smoking, Q 2008, 35  pack year, last CXR 12/2018 before surgery.  His blood pressure has been controlled at home (120/80), today their BP is   He does workout.  He denies chest pain, shortness of breath, dizziness.   He has been working on diet and exercise for diabetes  with history of CAD/PAD ,he is on 75m ASA With hyperlipidemia on crestor 20 at goal less than 70 With CKD he is on micardis Retinopathy - 2022 he is on metformin 2000 mg daily glimepiride 4 mg only on 1 a day  jardiance 10 mg daily He does check fasting glucose, has been better and running 120-130 or so. HBartholome Billreported parasthesias in the past but none recently  Denies foot ulcerations, hyperglycemia, polydipsia, polyuria and visual disturbances.  Last A1C in the office was:  Lab Results  Component Value Date   HGBA1C 9.5 (H) 09/18/2021   Lab Results  Component Value Date   GFRNONAA 64 08/09/2020   Patient is on Vitamin D supplement.   Lab Results  Component Value Date   VD25OH 139 (H) 09/18/2021        Medication Review:  Current Outpatient Medications (Endocrine & Metabolic):    Dulaglutide (TRULICITY) 3 MBJ/4.7WGSOPN, Inject into skin once weekly   empagliflozin (JARDIANCE) 25 MG TABS tablet, Take 1 tablet (25 mg total) by mouth daily.   metFORMIN (GLUCOPHAGE) 500 MG tablet, TAKE 2 TABLETS TWICE A DAY WITH MEALS FOR DIABETES  Current Outpatient Medications (Cardiovascular):    metoprolol tartrate (LOPRESSOR) 25 MG tablet, TAKE 1 TABLET TWICE A DAY (EVERY 12 HOURS) FOR BP   rosuvastatin (CRESTOR) 20 MG tablet, Take 1 tablet (20 mg total) by mouth daily.   telmisartan (MICARDIS) 20 MG tablet, TAKE 1 TABLET BY MOUTH DAILY FOR BLOOD PRESSURE AND KIDNEY PROTECTION   Current Outpatient Medications (Analgesics):    aspirin EC 81 MG tablet, Take 81 mg by mouth daily. Swallow whole.   Current Outpatient Medications (Other):    blood glucose meter kit and supplies, Test sugars once to twice daily   Cholecalciferol (VITAMIN D3)  125 MCG (5000 UT) CAPS, Take 5,000 Units by mouth daily.   co-enzyme Q-10 30 MG capsule, Take 30 mg by mouth 3 (three) times daily.   Multiple Vitamin (MULTIVITAMIN) tablet, Take 1 tablet by mouth daily.  Current Problems (verified) Patient Active Problem List   Diagnosis Date Noted   Coronary artery disease involving native coronary artery of native heart without angina pectoris 08/14/2020   CKD stage 2 due to type 2 diabetes mellitus ([place] 02/11/2020   Primary osteoarthritis of left knee 12/18/2018   beta thalassemia minor (trait) anemia 03/08/2018   Overweight (BMI 25.0-29.9) 08/10/2017   Poor compliance 09/09/2015   DM type 2 (diabetes mellitus, type 2) ([place] 09/08/2015   Neuropathy, diabetic ([place] 05/19/2015   Essential hypertension 10/11/2013   Vitamin D deficiency 10/11/2013   Medication management 10/11/2013   Hyperlipidemia associated with type 2 diabetes mellitus ([place] 03/01/2010   Carotid stenosis 03/01/2010   Allergies Allergies  Allergen Reactions   Ace Inhibitors Cough   Statins Other (See Comments)    Elevated LFT's    SURGICAL HISTORY He  has a past surgical history that includes Coronary artery bypass  graft (2008) and Total knee arthroplasty (Left, 12/18/2018). FAMILY HISTORY His family history includes Diabetes in his sister; Heart attack in his father; Heart disease in his brother and father; Hyperlipidemia in his father; Hypertension in his father. SOCIAL HISTORY He  reports that he quit smoking about 16 years ago. His smoking use included cigarettes. He started smoking about 50 years ago. He has a 34.00 pack-year smoking history. He has never used smokeless tobacco. He reports that he does not drink alcohol and does not use drugs.   MEDICARE WELLNESS OBJECTIVES: Physical activity:   Cardiac risk factors:   Depression/mood screen:   Depression screen [facility] 2/9 06/12/2020  Decreased Interest 0  Down, Depressed, Hopeless 0  PHQ - 2 Score 0    ADLs:  In your  present state of health, do you have any difficulty performing the following activities: 06/12/2020 06/10/2020  Hearing? N -  Vision? N -  Difficulty concentrating or making decisions? N -  Walking or climbing stairs? N -  Dressing or bathing? N -  Doing errands, shopping? N N  Some recent data might be hidden     Cognitive Testing  Alert? Yes  Normal Appearance?Yes  Oriented to person? Yes  Place? Yes   Time? Yes  Recall of three objects?  Yes  Can perform simple calculations? Yes  Displays appropriate judgment?Yes  Can read the correct time from a watch face?Yes  EOL planning:     Screening Tests Immunization History  Administered Date(s) Administered   DT (Pediatric) 02/07/2015   Influenza Whole 12/10/2012   Influenza, High Dose Seasonal PF 02/07/2015, 01/02/2016, 12/13/2016, 01/01/2018, 11/19/2018, 02/21/2020   Influenza-Unspecified 12/31/2013   Meningococcal Conjugate 09/03/2016   PFIZER(Purple Top)SARS-COV-2 Vaccination 04/02/2019, 04/23/2019   Pneumococcal Conjugate-13 02/02/2014   Pneumococcal Polysaccharide-23 04/13/2003, 10/08/2016   Td 04/18/2004   Health Maintenance  Topic Date Due   Zoster Vaccines- Shingrix (1 of 2) Never done   COVID-19 Vaccine (3 - Pfizer risk series) 05/21/2019   FOOT EXAM  11/19/2019   INFLUENZA VACCINE  10/09/2020   HEMOGLOBIN A1C  04/12/2021   OPHTHALMOLOGY EXAM  05/10/2021   COLONOSCOPY (Pts 45-24yr Insurance coverage will need to be confirmed)  08/22/2022   TETANUS/TDAP  02/06/2025   Pneumonia Vaccine 71 Years old  Completed   Hepatitis C Screening  Completed   HPV VACCINES  Aged Out   DEXA: declines Colonoscopy: 2014 due 2024  CXr 12/2018 has 34 pack year smoking hx, quit in 2007   EGD:N/A Echo 01/2008 Stress test 2013 MRI head 03/2007 Carotid [place] 08/14/2018 - by Dr. HPercival Spanish  Names of Other Physician/Practitioners you currently use: 1. [facility] here for primary care 2. Dr.  SEllwood Sayers his son 2022 3. Dr. CBurt Knack dentist, 2021- has full dentures  Patient Care Team: MUnk Pinto MD as PCP - General (Internal Medicine) HMinus Breeding MD as PCP - Cardiology (Cardiology) WKeene Breath, MD as Consulting Physician (Ophthalmology) HMinus Breeding MD as Consulting Physician (Cardiology) MClarene Essex MD as Consulting Physician (Gastroenterology) GDorna Leitz MD as Consulting Physician (Orthopedic Surgery) .  Review of Systems  Constitutional:  Negative for chills, fever and weight loss.  HENT:  Negative for congestion and hearing loss.   Eyes:  Negative for blurred vision and double vision.  Respiratory:  Negative for cough and shortness of breath.   Cardiovascular:  Negative for chest pain, palpitations, orthopnea and leg swelling.  Gastrointestinal:  Negative for abdominal pain, constipation, diarrhea, heartburn, nausea and  vomiting.  Musculoskeletal:  Positive for joint pain (right need). Negative for falls and myalgias.  Skin:  Negative for rash.  Neurological:  Negative for dizziness, tingling, tremors, loss of consciousness and headaches.  Psychiatric/Behavioral:  Negative for depression, memory loss and suicidal ideas.     Objective:   There were no vitals taken for this visit. There is no height or weight on file to calculate BMI.  General appearance: alert, no distress, WD/WN, male HEENT: normocephalic, sclerae anicteric, TMs pearly, nares patent, no discharge or erythema, pharynx normal Oral cavity: MMM, no lesions Neck: supple, no lymphadenopathy, no thyromegaly, no masses Heart: RRR, normal S1, S2, no murmurs Lungs: CTA bilaterally, no wheezes, rhonchi, or rales Abdomen: +bs, soft, non tender, non distended, no masses, no hepatomegaly, no splenomegaly Musculoskeletal: nontender, no swelling, varus deformity, antalgic gait, well healing vertical left knee scar , + crepitus right knee.  Extremities: no edema, no cyanosis, no clubbing Pulses:  2+ symmetric, upper and lower extremities, normal cap refill Neurological: alert, oriented x 3, CN2-12 intact, strength normal upper extremities and lower extremities, sensation abnormal bilateral feet to monofilament, DTRs 2+ throughout, no cerebellar signs Psychiatric: normal affect, behavior normal, pleasant   Medicare Attestation I have personally reviewed: The patient's medical and social history Their use of alcohol, tobacco or illicit drugs Their current medications and supplements The patient's functional ability including ADLs,fall risks, home safety risks, cognitive, and hearing and visual impairment Diet and physical activities Evidence for depression or mood disorders  The patient's weight, height, BMI, and visual acuity have been recorded in the chart.  I have made referrals, counseling, and provided education to the patient based on review of the above and I have provided the patient with a written personalized care plan for preventive services.    Magda Bernheim ANP-C  [facility]  01/09/2022

## 2022-01-10 ENCOUNTER — Ambulatory Visit (INDEPENDENT_AMBULATORY_CARE_PROVIDER_SITE_OTHER): Payer: Medicare Other | Admitting: Nurse Practitioner

## 2022-01-10 ENCOUNTER — Encounter: Payer: Self-pay | Admitting: Nurse Practitioner

## 2022-01-10 VITALS — BP 118/60 | HR 80 | Temp 97.7°F | Ht 60.0 in | Wt 123.2 lb

## 2022-01-10 DIAGNOSIS — R7989 Other specified abnormal findings of blood chemistry: Secondary | ICD-10-CM

## 2022-01-10 DIAGNOSIS — R6889 Other general symptoms and signs: Secondary | ICD-10-CM | POA: Diagnosis not present

## 2022-01-10 DIAGNOSIS — N182 Chronic kidney disease, stage 2 (mild): Secondary | ICD-10-CM

## 2022-01-10 DIAGNOSIS — D509 Iron deficiency anemia, unspecified: Secondary | ICD-10-CM

## 2022-01-10 DIAGNOSIS — K5903 Drug induced constipation: Secondary | ICD-10-CM

## 2022-01-10 DIAGNOSIS — E1169 Type 2 diabetes mellitus with other specified complication: Secondary | ICD-10-CM | POA: Diagnosis not present

## 2022-01-10 DIAGNOSIS — E559 Vitamin D deficiency, unspecified: Secondary | ICD-10-CM

## 2022-01-10 DIAGNOSIS — Z79899 Other long term (current) drug therapy: Secondary | ICD-10-CM

## 2022-01-10 DIAGNOSIS — E113293 Type 2 diabetes mellitus with mild nonproliferative diabetic retinopathy without macular edema, bilateral: Secondary | ICD-10-CM | POA: Diagnosis not present

## 2022-01-10 DIAGNOSIS — D329 Benign neoplasm of meninges, unspecified: Secondary | ICD-10-CM | POA: Diagnosis not present

## 2022-01-10 DIAGNOSIS — E1122 Type 2 diabetes mellitus with diabetic chronic kidney disease: Secondary | ICD-10-CM | POA: Diagnosis not present

## 2022-01-10 DIAGNOSIS — Z0000 Encounter for general adult medical examination without abnormal findings: Secondary | ICD-10-CM

## 2022-01-10 DIAGNOSIS — E1142 Type 2 diabetes mellitus with diabetic polyneuropathy: Secondary | ICD-10-CM | POA: Diagnosis not present

## 2022-01-10 DIAGNOSIS — E785 Hyperlipidemia, unspecified: Secondary | ICD-10-CM | POA: Diagnosis not present

## 2022-01-10 DIAGNOSIS — I6529 Occlusion and stenosis of unspecified carotid artery: Secondary | ICD-10-CM | POA: Diagnosis not present

## 2022-01-10 DIAGNOSIS — Z23 Encounter for immunization: Secondary | ICD-10-CM

## 2022-01-10 DIAGNOSIS — I2510 Atherosclerotic heart disease of native coronary artery without angina pectoris: Secondary | ICD-10-CM

## 2022-01-10 DIAGNOSIS — Z0001 Encounter for general adult medical examination with abnormal findings: Secondary | ICD-10-CM

## 2022-01-10 DIAGNOSIS — R1031 Right lower quadrant pain: Secondary | ICD-10-CM

## 2022-01-10 DIAGNOSIS — R1032 Left lower quadrant pain: Secondary | ICD-10-CM

## 2022-01-10 DIAGNOSIS — Z87891 Personal history of nicotine dependence: Secondary | ICD-10-CM | POA: Diagnosis not present

## 2022-01-10 MED ORDER — EMPAGLIFLOZIN 25 MG PO TABS: 25.0000 mg | ORAL_TABLET | Freq: Every day | ORAL | 3 refills | Status: DC

## 2022-01-10 NOTE — Patient Instructions (Signed)
Chest xray ordered at [facility] M-F 8:30-3:45  MRI is being ordered to recheck meningioma discovered on MRI from 06/2020 to check stability  Monitor abdominal pain and if worsens notify the office.  If develop nausea, vomiting fever or severe pain please go to the ER  Use Stool softener daily and Miralax once a week for constipation.   Abdominal Pain, Adult Many things can cause belly (abdominal) pain. Most times, belly pain is not dangerous. Many cases of belly pain can be watched and treated at home. Sometimes, though, belly pain is serious. Your doctor will try to find the cause of your belly pain. Follow these instructions at home:  Medicines Take over-the-counter and prescription medicines only as told by your doctor. Do not take medicines that help you poop (laxatives) unless told by your doctor. General instructions Watch your belly pain for any changes. Drink enough fluid to keep your pee (urine) pale yellow. Keep all follow-up visits as told by your doctor. This is important. Contact a doctor if: Your belly pain changes or gets worse. You are not hungry, or you lose weight without trying. You are having trouble pooping (constipated) or have watery poop (diarrhea) for more than 2-3 days. You have pain when you pee or poop. Your belly pain wakes you up at night. Your pain gets worse with meals, after eating, or with certain foods. You are vomiting and cannot keep anything down. You have a fever. You have blood in your pee. Get help right away if: Your pain does not go away as soon as your doctor says it should. You cannot stop vomiting. Your pain is only in areas of your belly, such as the right side or the left lower part of the belly. You have bloody or black poop, or poop that looks like tar. You have very bad pain, cramping, or bloating in your belly. You have signs of not having enough fluid or water in your body (dehydration), such  as: Dark pee, very little pee, or no pee. Cracked lips. Dry mouth. Sunken eyes. Sleepiness. Weakness. You have trouble breathing or chest pain. Summary Many cases of belly pain can be watched and treated at home. Watch your belly pain for any changes. Take over-the-counter and prescription medicines only as told by your doctor. Contact a doctor if your belly pain changes or gets worse. Get help right away if you have very bad pain, cramping, or bloating in your belly. This information is not intended to replace advice given to you by your health care provider. Make sure you discuss any questions you have with your health care provider. Document Revised: 07/06/2018 Document Reviewed: 07/06/2018 Elsevier Patient Education  [employer].

## 2022-01-10 NOTE — Addendum Note (Signed)
Addended by: Lorenda Peck E on: 01/10/2022 04:57 PM   Modules accepted: Orders

## 2022-01-11 ENCOUNTER — Other Ambulatory Visit: Payer: Self-pay | Admitting: Nurse Practitioner

## 2022-01-11 DIAGNOSIS — E1169 Type 2 diabetes mellitus with other specified complication: Secondary | ICD-10-CM

## 2022-01-11 LAB — COMPLETE METABOLIC PANEL WITH GFR
AG Ratio: 1.6 (calc) (ref 1.0–2.5)
ALT: 24 U/L (ref 9–46)
AST: 18 U/L (ref 10–35)
Albumin: 4.1 g/dL (ref 3.6–5.1)
Alkaline phosphatase (APISO): 64 U/L (ref 35–144)
BUN: 22 mg/dL (ref 7–25)
CO2: 30 mmol/L (ref 20–32)
Calcium: 9.1 mg/dL (ref 8.6–10.3)
Chloride: 101 mmol/L (ref 98–110)
Creat: 1.06 mg/dL (ref 0.70–1.28)
Globulin: 2.5 g/dL (calc) (ref 1.9–3.7)
Glucose, Bld: 166 mg/dL — ABNORMAL HIGH (ref 65–99)
Potassium: 4.8 mmol/L (ref 3.5–5.3)
Sodium: 136 mmol/L (ref 135–146)
Total Bilirubin: 0.6 mg/dL (ref 0.2–1.2)
Total Protein: 6.6 g/dL (ref 6.1–8.1)
eGFR: 74 mL/min/{1.73_m2} (ref >=60)

## 2022-01-11 LAB — CBC WITH DIFFERENTIAL/PLATELET
Absolute Monocytes: 519 cells/uL (ref 200–950)
Basophils Absolute: 61 cells/uL (ref 0–200)
Basophils Relative: 1 %
Eosinophils Absolute: 421 cells/uL (ref 15–500)
Eosinophils Relative: 6.9 %
HCT: 39.5 % (ref 38.5–50.0)
Hemoglobin: 11.9 g/dL — ABNORMAL LOW (ref 13.2–17.1)
Lymphs Abs: 1446 cells/uL (ref 850–3900)
MCH: 20.5 pg — ABNORMAL LOW (ref 27.0–33.0)
MCHC: 30.1 g/dL — ABNORMAL LOW (ref 32.0–36.0)
MCV: 68.1 fL — ABNORMAL LOW (ref 80.0–100.0)
MPV: 11.8 fL (ref 7.5–12.5)
Monocytes Relative: 8.5 %
Neutro Abs: 3654 cells/uL (ref 1500–7800)
Neutrophils Relative %: 59.9 %
Platelets: 318 10*3/uL (ref 140–400)
RBC: 5.8 10*6/uL (ref 4.20–5.80)
RDW: 17.3 % — ABNORMAL HIGH (ref 11.0–15.0)
Total Lymphocyte: 23.7 %
WBC: 6.1 10*3/uL (ref 3.8–10.8)

## 2022-01-11 LAB — LIPID PANEL
Cholesterol: 97 mg/dL (ref <200)
HDL: 37 mg/dL — ABNORMAL LOW (ref >=40)
LDL Cholesterol (Calc): 41 mg/dL (calc)
Non-HDL Cholesterol (Calc): 60 mg/dL (calc) (ref <130)
Total CHOL/HDL Ratio: 2.6 (calc) (ref <5.0)
Triglycerides: 100 mg/dL (ref <150)

## 2022-01-11 LAB — CBC MORPHOLOGY

## 2022-01-11 LAB — HEMOGLOBIN A1C
Hgb A1c MFr Bld: 10.6 % of total Hgb — ABNORMAL HIGH (ref <5.7)
Mean Plasma Glucose: 258 mg/dL
eAG (mmol/L): 14.3 mmol/L

## 2022-01-11 MED ORDER — TRULICITY 4.5 MG/0.5ML ~~LOC~~ SOAJ: 4.5000 mg | SUBCUTANEOUS | 3 refills | Status: DC

## 2022-02-01 ENCOUNTER — Other Ambulatory Visit: Payer: Self-pay | Admitting: Nurse Practitioner

## 2022-02-01 ENCOUNTER — Other Ambulatory Visit: Payer: Self-pay | Admitting: Cardiology

## 2022-02-01 DIAGNOSIS — I10 Essential (primary) hypertension: Secondary | ICD-10-CM

## 2022-02-01 DIAGNOSIS — T464X5A Adverse effect of angiotensin-converting-enzyme inhibitors, initial encounter: Secondary | ICD-10-CM

## 2022-03-18 NOTE — Progress Notes (Deleted)
Assessment and Plan:  There are no diagnoses linked to this encounter.    Further disposition pending results of labs. Discussed med's effects and SE's.   Over 30 minutes of exam, counseling, chart review, and critical decision making was performed.   Future Appointments  Date Time Provider [place]  03/19/2022  3:30 PM Alycia Rossetti, NP [facility] None  04/22/2022  9:20 AM [facility]  06/06/2022 10:00 AM Alycia Rossetti, NP [facility] None  09/24/2022 11:00 AM Alycia Rossetti, NP [facility] None    ------------------------------------------------------------------------------------------------------------------   HPI There were no vitals taken for this visit. 72 y.o.male presents for  Past Medical History:  Diagnosis Date   Acute metabolic encephalopathy 07/17/3265   Anemia    Carotid artery stenosis 01/2017   L-ICA 1-39%, R-ICA 40-59% doppler   Coronary artery disease    s/p CABG in 2008, w/ LIMA-LAD, SVG-Diag, SVG-OM, SVG-PL   Hypertension    Vitamin D deficiency      Allergies  Allergen Reactions   Ace Inhibitors Cough   Statins Other (See Comments)    Elevated LFT's    Current Outpatient Medications on File Prior to Visit  Medication Sig   Dulaglutide (TRULICITY) 4.5 TI/4.5YK SOPN Inject 4.5 mg as directed once a week.   aspirin EC 81 MG tablet Take 81 mg by mouth daily. Swallow whole.   blood glucose meter kit and supplies Test sugars once to twice daily   Cholecalciferol (VITAMIN D3) 125 MCG (5000 UT) CAPS Take 5,000 Units by mouth daily.   co-enzyme Q-10 30 MG capsule Take 30 mg by mouth 3 (three) times daily.   empagliflozin (JARDIANCE) 25 MG TABS tablet Take 1 tablet (25 mg total) by mouth daily.   metFORMIN (GLUCOPHAGE) 500 MG tablet TAKE 2 TABLETS TWICE A DAY WITH MEALS FOR DIABETES   metoprolol tartrate (LOPRESSOR) 25 MG tablet TAKE 1 TABLET TWICE A DAY (EVERY 12 HOURS) FOR BP   Multiple Vitamin (MULTIVITAMIN)  tablet Take 1 tablet by mouth daily.   rosuvastatin (CRESTOR) 20 MG tablet Take 1 tablet (20 mg total) by mouth daily.   telmisartan (MICARDIS) 20 MG tablet TAKE 1 TABLET BY MOUTH DAILY FOR BLOOD PRESSURE AND KIDNEY PROTECTION   No current facility-administered medications on file prior to visit.    ROS: all negative except above.   Physical Exam:  There were no vitals taken for this visit.  General Appearance: Well nourished, in no apparent distress. Eyes: PERRLA, EOMs, conjunctiva no swelling or erythema Sinuses: No Frontal/maxillary tenderness ENT/Mouth: Ext aud canals clear, TMs without erythema, bulging. No erythema, swelling, or exudate on post pharynx.  Tonsils not swollen or erythematous. Hearing normal.  Neck: Supple, thyroid normal.  Respiratory: Respiratory effort normal, BS equal bilaterally without rales, rhonchi, wheezing or stridor.  Cardio: RRR with no MRGs. Brisk peripheral pulses without edema.  Abdomen: Soft, + BS.  Non tender, no guarding, rebound, hernias, masses. Lymphatics: Non tender without lymphadenopathy.  Musculoskeletal: Full ROM, 5/5 strength, normal gait.  Skin: Warm, dry without rashes, lesions, ecchymosis.  Neuro: Cranial nerves intact. Normal muscle tone, no cerebellar symptoms. Sensation intact.  Psych: Awake and oriented X 3, normal affect, Insight and Judgment appropriate.     Alycia Rossetti, NP 12:17 PM [facility]

## 2022-03-19 ENCOUNTER — Ambulatory Visit: Payer: Medicare Other | Admitting: Internal Medicine

## 2022-03-19 ENCOUNTER — Ambulatory Visit: Payer: Medicare Other | Admitting: Nurse Practitioner

## 2022-03-19 ENCOUNTER — Ambulatory Visit (INDEPENDENT_AMBULATORY_CARE_PROVIDER_SITE_OTHER): Payer: Medicare Other | Admitting: Internal Medicine

## 2022-03-19 ENCOUNTER — Encounter: Payer: Self-pay | Admitting: Internal Medicine

## 2022-03-19 VITALS — BP 136/75 | HR 81 | Temp 97.4°F | Resp 16 | Ht 60.0 in | Wt 118.2 lb

## 2022-03-19 DIAGNOSIS — K581 Irritable bowel syndrome with constipation: Secondary | ICD-10-CM | POA: Diagnosis not present

## 2022-03-19 MED ORDER — DICYCLOMINE HCL 20 MG PO TABS: ORAL_TABLET | ORAL | 1 refills | Status: DC

## 2022-03-19 NOTE — Progress Notes (Addendum)
Future Appointments  Date Time Provider Department  06/06/2022                       cpe 10:00 AM Alycia Rossetti, NP [facility]  09/24/2022                       wellness 11:00 AM Alycia Rossetti, NP [facility]    History of Present Illness:     This very nice 72 y.o. married  man  with HTN, ASCAD s/p CABG,  HLD, T2_NIDDM and Vitamin D Deficiency presents for complaints  of 6 week prodrome of mid to lower abdominal bloating / cramping, occasional constipation. Denies N/V, emesis . He has been off of his Trulicity about 2 weeks.     Medications  Current Outpatient Medications (Endocrine & Metabolic):    Dulaglutide (TRULICITY) 4.5 AS/5.0NL SOPN, Inject 4.5 mg as directed once a week.   empagliflozin (JARDIANCE) 25 MG TABS tablet, Take 1 tablet (25 mg total) by mouth daily.   metFORMIN (GLUCOPHAGE) 500 MG tablet, TAKE 2 TABLETS TWICE A DAY WITH MEALS FOR DIABETES  Current Outpatient Medications (Cardiovascular):    metoprolol tartrate (LOPRESSOR) 25 MG tablet, TAKE 1 TABLET TWICE A DAY (EVERY 12 HOURS) FOR BP   rosuvastatin (CRESTOR) 20 MG tablet, Take 1 tablet (20 mg total) by mouth daily.   telmisartan (MICARDIS) 20 MG tablet, TAKE 1 TABLET BY MOUTH DAILY FOR BLOOD PRESSURE AND KIDNEY PROTECTION   Current Outpatient Medications (Analgesics):    aspirin EC 81 MG tablet, Take 81 mg by mouth daily. Swallow whole.   Current Outpatient Medications (Other):    blood glucose meter kit and supplies, Test sugars once to twice daily   Cholecalciferol (VITAMIN D3) 125 MCG (5000 UT) CAPS, Take 5,000 Units by mouth daily.   co-enzyme Q-10 30 MG capsule, Take 30 mg by mouth 3 (three) times daily.   Multiple Vitamin (MULTIVITAMIN) tablet, Take 1 tablet by mouth daily.  Problem list He has Hyperlipidemia associated with type 2 diabetes mellitus ([place]); Carotid stenosis; Essential hypertension; Vitamin D deficiency; Medication management; Neuropathy, diabetic ([place]); DM type 2 (diabetes  mellitus, type 2) ([place]); Poor compliance; Overweight (BMI 25.0-29.9); beta thalassemia minor (trait) anemia; Primary osteoarthritis of left knee; CKD stage 2 due to type 2 diabetes mellitus ([place]); and Coronary artery disease involving native coronary artery of native heart without angina pectoris on their problem list.   Observations/Objective:  BP 136/75   Pulse 81   Temp (!) 97.4 F (36.3 C)   Resp 16   Ht 5' (1.524 m)   Wt 118 lb 3.2 oz (53.6 kg)   SpO2 99%   BMI 23.08 kg/m   HEENT - WNL. Neck - supple.  Chest - Clear equal BS. Cor - Nl HS. RRR w/o sig MGR. PP 1(+). No edema. Abd - Soft with slight LLQ tenderness  & w/o guarding or masses.  MS- FROM w/o deformities.  Gait Nl. Neuro -  Nl w/o focal abnormalities.   Assessment and Plan:   1. Irritable bowel syndrome with constipation  - dicyclomine (BENTYL) 20 MG tablet;  Take  1/2 to 1 tablet   4 x /day  before Meals & Bedtime for               Nausea, Cramping, Bloating or Diarrhea   Dispense: 100 tablet; Refill: 1      Follow Up Instructions:  I discussed the assessment and treatment plan with the patient. The patient was provided an opportunity to ask questions and all were answered. The patient agreed with the plan and demonstrated an understanding of the instructions.       The patient was advised to call back or seek an in-person evaluation if the symptoms worsen or if the condition fails to improve as anticipated.    Kirtland Bouchard, MD

## 2022-04-06 ENCOUNTER — Other Ambulatory Visit: Payer: Self-pay | Admitting: Internal Medicine

## 2022-04-06 DIAGNOSIS — K581 Irritable bowel syndrome with constipation: Secondary | ICD-10-CM

## 2022-04-07 ENCOUNTER — Encounter: Payer: Self-pay | Admitting: Internal Medicine

## 2022-04-07 NOTE — Progress Notes (Unsigned)
     Future Appointments  Date Time Provider Department  04/08/2022 10:30 AM Unk Pinto, MD [facility]  06/06/2022 10:00 AM Alycia Rossetti, NP [facility]  09/24/2022 11:00 AM Alycia Rossetti, NP [facility]    History of Present Illness:     This very nice 72 y.o. married  man  with HTN, ASCAD s/p CABG,  HLD, T2_NIDDM and Vitamin D Deficiency returns for 2  week prodrome of sx's of mid to lower abdominal bloating /cramping, occasional constipation.  Patient had been on trulicity & had been off for 2 weeks prior to last OV & he has felt to have IBD-C and prescribed Dicyclomine.      Current Outpatient Medications on File Prior to Visit  Medication Sig   aspirin EC 81 MG tablet Take 81 mg by mouth daily. Swallow whole.   blood glucose meter kit and supplies Test sugars once to twice daily   Cholecalciferol (VITAMIN D3) 125 MCG (5000 UT) CAPS Take 5,000 Units by mouth daily.   co-enzyme Q-10 30 MG capsule Take 30 mg by mouth 3 (three) times daily.   dicyclomine (BENTYL) 20 MG tablet TAKE 1/2 TO 1 TABLET 4 X /DAY BEFORE MEALS & BEDTIME FOR NAUSEA, CRAMPING, BLOATING OR DIARRHEA   Dulaglutide (TRULICITY) 4.5 JO/8.7OM SOPN Inject 4.5 mg as directed once a week.   empagliflozin (JARDIANCE) 25 MG TABS tablet Take 1 tablet (25 mg total) by mouth daily.   metFORMIN (GLUCOPHAGE) 500 MG tablet TAKE 2 TABLETS TWICE A DAY WITH MEALS FOR DIABETES   metoprolol tartrate (LOPRESSOR) 25 MG tablet TAKE 1 TABLET TWICE A DAY (EVERY 12 HOURS) FOR BP   Multiple Vitamin (MULTIVITAMIN) tablet Take 1 tablet by mouth daily.   rosuvastatin (CRESTOR) 20 MG tablet Take 1 tablet (20 mg total) by mouth daily.   telmisartan (MICARDIS) 20 MG tablet TAKE 1 TABLET BY MOUTH DAILY FOR BLOOD PRESSURE AND KIDNEY PROTECTION   No current facility-administered medications on file prior to visit.    Allergies  Allergen Reactions   Ace Inhibitors Cough   Statins Other (See Comments)    Elevated LFT's      Problem list He has Hyperlipidemia associated with type 2 diabetes mellitus ([place]); Carotid stenosis; Essential hypertension; Vitamin D deficiency; Medication management; Neuropathy, diabetic ([place]); DM type 2 (diabetes mellitus, type 2) ([place]); Poor compliance; Overweight (BMI 25.0-29.9); beta thalassemia minor (trait) anemia; Primary osteoarthritis of left knee; CKD stage 2 due to type 2 diabetes mellitus ([place]); and Coronary artery disease involving native coronary artery of native heart without angina pectoris on their problem list.   Observations/Objective:  There were no vitals taken for this visit.  HEENT - WNL. Neck - supple.  Chest - Clear equal BS. Cor - Nl HS. RRR w/o sig MGR. PP 1(+). No edema. MS- FROM w/o deformities.  Gait Nl. Neuro -  Nl w/o focal abnormalities.   Assessment and Plan:      Follow Up Instructions:        I discussed the assessment and treatment plan with the patient. The patient was provided an opportunity to ask questions and all were answered. The patient agreed with the plan and demonstrated an understanding of the instructions.       The patient was advised to call back or seek an in-person evaluation if the symptoms worsen or if the condition fails to improve as anticipated.    Kirtland Bouchard, MD

## 2022-04-08 ENCOUNTER — Ambulatory Visit (INDEPENDENT_AMBULATORY_CARE_PROVIDER_SITE_OTHER): Payer: Medicare Other | Admitting: Internal Medicine

## 2022-04-08 ENCOUNTER — Encounter: Payer: Self-pay | Admitting: Internal Medicine

## 2022-04-08 VITALS — BP 104/58 | HR 89 | Temp 97.9°F | Resp 16 | Ht 60.0 in | Wt 114.0 lb

## 2022-04-08 DIAGNOSIS — K581 Irritable bowel syndrome with constipation: Secondary | ICD-10-CM

## 2022-04-08 NOTE — Patient Instructions (Signed)
Diet for Irritable Bowel Syndrome When you have irritable bowel syndrome (IBS), it is very important to follow the eating habits that are best for your condition. IBS may cause various symptoms, such as pain in the abdomen, constipation, or diarrhea. Choosing the right foods can help to ease the discomfort from these symptoms. Work with your health care provider and dietitian to find the eating plan that will help to control your symptoms. What are tips for following this plan?  Keep a food diary. This will help you identify foods that cause symptoms. Write down: What you eat and when you eat it. What symptoms you have. When symptoms occur in relation to your meals, such as "pain in abdomen 2 hours after dinner." Eat your meals slowly and in a relaxed setting. Aim to eat 5-6 small meals per day. Do not skip meals. Drink enough fluid to keep your urine pale yellow. Ask your health care provider if you should take an over-the-counter probiotic to help restore healthy bacteria in your gut (digestive tract). Probiotics are foods that contain good bacteria and yeasts. Your dietitian may have specific dietary recommendations for you based on your symptoms. Your dietitian may recommend that you: Avoid foods that cause symptoms. Talk with your dietitian about other ways to get the same nutrients that are in those problem foods. Avoid foods with gluten. Gluten is a protein that is found in rye, wheat, and barley. Eat more foods that contain soluble fiber. Examples of foods with high soluble fiber include oats, seeds, and certain fruits and vegetables. Take a fiber supplement if told by your dietitian. Reduce or avoid certain foods called FODMAPs. These are foods that contain sugars that are hard for some people to digest. Ask your health care provider which foods to avoid. What foods should I avoid? The following are some foods and drinks that may make your symptoms worse: Fatty foods, such as french  fries. Foods that contain gluten, such as pasta and cereal. Dairy products, such as milk, cheese, and ice cream. Spicy foods. Alcohol. Products with caffeine, such as coffee, tea, or chocolate. Carbonated drinks, such as soda. Foods that are high in FODMAPs. These include certain fruits and vegetables. Products with sweeteners such as honey, high fructose corn syrup, sorbitol, and mannitol. The items listed above may not be a complete list of foods and beverages you should avoid. Contact a dietitian for more information. What foods are good sources of fiber? Your health care provider or dietitian may recommend that you eat more foods that contain fiber. Fiber can help to reduce constipation and other IBS symptoms. Add foods with fiber to your diet a little at a time so your body can get used to them. Too much fiber at one time might cause gas and swelling of your abdomen. The following are some foods that are good sources of fiber: Berries, such as raspberries, strawberries, and blueberries. Tomatoes. Carrots. Brown rice. Oats. Seeds, such as chia and pumpkin seeds. The items listed above may not be a complete list of recommended sources of fiber. Contact your dietitian for more options. Where to find more information International Foundation for Functional Gastrointestinal Disorders: aboutibs.org National Institute of Diabetes and Digestive and Kidney Diseases: niddk.nih.gov Summary When you have irritable bowel syndrome (IBS), it is very important to follow the eating habits that are best for your condition. IBS may cause various symptoms, such as pain in the abdomen, constipation, or diarrhea. Choosing the right foods can help to ease the   discomfort that comes from symptoms. Your health care provider or dietitian may recommend that you eat more foods that contain fiber. Keep a food diary. This will help you identify foods that cause symptoms. This information is not intended to replace  advice given to you by your health care provider. Make sure you discuss any questions you have with your health care provider. Document Revised: 02/06/2021 Document Reviewed: 02/06/2021 Elsevier Patient Education  2023 Elsevier Inc.  

## 2022-04-19 ENCOUNTER — Other Ambulatory Visit: Payer: Self-pay | Admitting: Nurse Practitioner

## 2022-04-22 ENCOUNTER — Other Ambulatory Visit (HOSPITAL_COMMUNITY): Payer: Medicare Other

## 2022-04-22 ENCOUNTER — Ambulatory Visit (HOSPITAL_COMMUNITY): Admission: RE | Admit: 2022-04-22 | Payer: Medicare Other | Source: Ambulatory Visit

## 2022-04-29 ENCOUNTER — Other Ambulatory Visit: Payer: Self-pay | Admitting: *Deleted

## 2022-04-29 DIAGNOSIS — I6523 Occlusion and stenosis of bilateral carotid arteries: Secondary | ICD-10-CM

## 2022-05-20 ENCOUNTER — Encounter (HOSPITAL_COMMUNITY): Payer: Medicare Other

## 2022-05-21 ENCOUNTER — Ambulatory Visit (HOSPITAL_COMMUNITY)
Admission: RE | Admit: 2022-05-21 | Discharge: 2022-05-21 | Disposition: A | Discharge status: Home / Self Care | Payer: Medicare Other | Source: Ambulatory Visit | Attending: Cardiology | Admitting: Cardiology

## 2022-05-21 DIAGNOSIS — I6523 Occlusion and stenosis of bilateral carotid arteries: Secondary | ICD-10-CM | POA: Insufficient documentation

## 2022-05-27 ENCOUNTER — Other Ambulatory Visit: Payer: Self-pay | Admitting: *Deleted

## 2022-05-27 ENCOUNTER — Encounter: Payer: Self-pay | Admitting: *Deleted

## 2022-05-27 DIAGNOSIS — I6523 Occlusion and stenosis of bilateral carotid arteries: Secondary | ICD-10-CM

## 2022-06-06 ENCOUNTER — Encounter: Payer: Medicare Other | Admitting: Nurse Practitioner

## 2022-06-10 ENCOUNTER — Other Ambulatory Visit: Payer: Self-pay | Admitting: Nurse Practitioner

## 2022-06-24 ENCOUNTER — Encounter: Payer: Self-pay | Admitting: Nurse Practitioner

## 2022-06-24 ENCOUNTER — Ambulatory Visit (INDEPENDENT_AMBULATORY_CARE_PROVIDER_SITE_OTHER): Payer: Medicare Other | Admitting: Nurse Practitioner

## 2022-06-24 ENCOUNTER — Other Ambulatory Visit: Payer: Medicare Other

## 2022-06-24 VITALS — BP 96/60 | HR 89 | Temp 97.3°F | Ht 60.0 in | Wt 110.8 lb

## 2022-06-24 DIAGNOSIS — I10 Essential (primary) hypertension: Secondary | ICD-10-CM

## 2022-06-24 DIAGNOSIS — N182 Chronic kidney disease, stage 2 (mild): Secondary | ICD-10-CM

## 2022-06-24 DIAGNOSIS — E1122 Type 2 diabetes mellitus with diabetic chronic kidney disease: Secondary | ICD-10-CM

## 2022-06-24 DIAGNOSIS — R634 Abnormal weight loss: Secondary | ICD-10-CM | POA: Diagnosis not present

## 2022-06-24 DIAGNOSIS — E785 Hyperlipidemia, unspecified: Secondary | ICD-10-CM

## 2022-06-24 DIAGNOSIS — Z79899 Other long term (current) drug therapy: Secondary | ICD-10-CM

## 2022-06-24 DIAGNOSIS — R109 Unspecified abdominal pain: Secondary | ICD-10-CM

## 2022-06-24 DIAGNOSIS — E1169 Type 2 diabetes mellitus with other specified complication: Secondary | ICD-10-CM | POA: Diagnosis not present

## 2022-06-24 LAB — CBC WITH DIFFERENTIAL/PLATELET
Absolute Monocytes: 892 cells/uL (ref 200–950)
Eosinophils Absolute: 193 cells/uL (ref 15–500)
Hemoglobin: 11.4 g/dL — ABNORMAL LOW (ref 13.2–17.1)
MCH: 19.6 pg — ABNORMAL LOW (ref 27.0–33.0)
MPV: 11.9 fL (ref 7.5–12.5)
Monocytes Relative: 9.7 %
Platelets: 427 10*3/uL — ABNORMAL HIGH (ref 140–400)

## 2022-06-24 NOTE — Progress Notes (Addendum)
Assessment and Plan:  William Haney was seen today for acute visit.  Diagnoses and all orders for this visit:  Type 2 diabetes mellitus with stage 2 chronic kidney disease, without long-term current use of insulin Continue medications: Metformin 2 tabs BID and Jardiance 25 mg QD Trulicity was d/c'd due to weight loss Continue diet and exercise.  Perform daily foot/skin check, notify office of any concerning changes.  Check A1C   Essential hypertension D/C metoprolol and take Telmisartan 20 mg in AM - continue DASH diet, exercise and monitor at home. Call if greater than 130/80.  Follow up in 4 weeks.  If BP remains less than 100/60 off Metoprolol he should notify the office   Stomach pain/Weight loss Pt has been experiencing cramping abdominal pain and nausea with weight loss which has gotten progressively worse over the past year Continue Dicyclomine until evaluated by GI -     Ambulatory referral to Gastroenterology - CBC, CMP , TSH  Hyperlipidemia with Type 2 Diabetes Mellitus(HCC) Continue rosuvastatin 20 mg , diet and exercise Lipid Panel     Further disposition pending results of labs. Discussed med's effects and SE's.   Over 30 minutes of exam, counseling, chart review, and critical decision making was performed.   Future Appointments  Date Time Provider Department Center  07/19/2022 10:30 AM Raynelle Dick, NP [facility] None  10/21/2022  9:00 AM Raynelle Dick, NP [facility] None  01/21/2023  9:30 AM Lucky Cowboy, MD [facility] None    ------------------------------------------------------------------------------------------------------------------   HPI BP 96/60   Pulse 89   Temp (!) 97.3 F (36.3 C)   Ht 5' (1.524 m)   Wt 110 lb 12.8 oz (50.3 kg)   SpO2 96%   BMI 21.64 kg/m   72 y.o.male presents for stomach pain and cramping He gets nauseated when eating. Takes dicyclomine which resolves for short periods then cramping returns and does not want to  eat   BMI is Body mass index is 21.64 kg/m., he has not been working on diet and exercise.He is losing weight because he becomes nauseated quickly Wt Readings from Last 3 Encounters:  06/24/22 110 lb 12.8 oz (50.3 kg)  04/08/22 114 lb (51.7 kg)  03/19/22 118 lb 3.2 oz (53.6 kg)    BP is currently controlled with telmisartan 20 mg QD and metoprolol 25 mg once a day due to low BP with dizziness BP Readings from Last 3 Encounters:  06/24/22 96/60  04/08/22 (!) 104/58  03/19/22 136/75   He is currently on Rosuvastatin 20 mg and denies myalgias.  Last lipid was: Lab Results  Component Value Date   CHOL 97 01/10/2022   HDL 37 (L) 01/10/2022   LDLCALC 41 01/10/2022   TRIG 100 01/10/2022   CHOLHDL 2.6 01/10/2022      Past Medical History:  Diagnosis Date   Acute metabolic encephalopathy 06/08/2020   Anemia    Carotid artery stenosis 01/2017   L-ICA 1-39%, R-ICA 40-59% doppler   Coronary artery disease    s/p CABG in 2008, w/ LIMA-LAD, SVG-Diag, SVG-OM, SVG-PL   Hypertension    Vitamin D deficiency      Allergies  Allergen Reactions   Ace Inhibitors Cough   Statins Other (See Comments)    Elevated LFT's    Current Outpatient Medications on File Prior to Visit  Medication Sig   aspirin EC 81 MG tablet Take 81 mg by mouth daily. Swallow whole.   blood glucose meter kit and supplies Test sugars once  to twice daily   Cholecalciferol (VITAMIN D3) 125 MCG (5000 UT) CAPS Take 5,000 Units by mouth daily.   co-enzyme Q-10 30 MG capsule Take 30 mg by mouth 3 (three) times daily.   dicyclomine (BENTYL) 20 MG tablet TAKE 1/2 TO 1 TABLET 4 X /DAY BEFORE MEALS & BEDTIME FOR NAUSEA, CRAMPING, BLOATING OR DIARRHEA   empagliflozin (JARDIANCE) 25 MG TABS tablet Take 1 tablet (25 mg total) by mouth daily.   metFORMIN (GLUCOPHAGE) 500 MG tablet TAKE 2 TABLETS BY MOUTH TWICE A DAY WITH MEALS FOR DIABETES   metoprolol tartrate (LOPRESSOR) 25 MG tablet TAKE 1 TABLET TWICE A DAY (EVERY 12  HOURS) FOR BLOOD PRESSURE   Multiple Vitamin (MULTIVITAMIN) tablet Take 1 tablet by mouth daily.   rosuvastatin (CRESTOR) 20 MG tablet Take 1 tablet (20 mg total) by mouth daily.   telmisartan (MICARDIS) 20 MG tablet TAKE 1 TABLET BY MOUTH DAILY FOR BLOOD PRESSURE AND KIDNEY PROTECTION   Dulaglutide (TRULICITY) 4.5 MG/0.5ML SOPN Inject 4.5 mg as directed once a week. (Patient not taking: Reported on 04/08/2022)   No current facility-administered medications on file prior to visit.    ROS: all negative except above.   Physical Exam:  BP 96/60   Pulse 89   Temp (!) 97.3 F (36.3 C)   Ht 5' (1.524 m)   Wt 110 lb 12.8 oz (50.3 kg)   SpO2 96%   BMI 21.64 kg/m   General Appearance: Thin frail male Eyes: PERRLA, EOMs, conjunctiva no swelling or erythema Respiratory: Respiratory effort normal, BS equal bilaterally without rales, rhonchi, wheezing or stridor.  Cardio: RRR with no MRGs. Brisk peripheral pulses without edema.  Abdomen: Soft, + BS.  Non tender, no guarding, rebound, hernias, masses. Lymphatics: Non tender without lymphadenopathy.  Musculoskeletal: Full ROM, 5/5 strength, normal gait.  Skin: Warm, dry without rashes, lesions, ecchymosis.  Neuro: Cranial nerves intact. Normal muscle tone, no cerebellar symptoms. Sensation intact.  Psych: Awake and oriented X 3, flat affect, Insight and Judgment appropriate.     Raynelle Dick, NP 10:18 AM [facility]

## 2022-06-24 NOTE — Addendum Note (Signed)
Addended by: Anda Kraft E on: 06/24/2022 11:17 AM   Modules accepted: Orders

## 2022-06-24 NOTE — Addendum Note (Signed)
Addended by: Anda Kraft E on: 06/24/2022 11:19 AM   Modules accepted: Level of Service

## 2022-06-24 NOTE — Patient Instructions (Signed)
Stop metoprolol and take only telmisartan in the morning  Hypertension, Adult Hypertension is another name for high blood pressure. High blood pressure forces your heart to work harder to pump blood. This can cause problems over time. There are two numbers in a blood pressure reading. There is a top number (systolic) over a bottom number (diastolic). It is best to have a blood pressure that is below 120/80. What are the causes? The cause of this condition is not known. Some other conditions can lead to high blood pressure. What increases the risk? Some lifestyle factors can make you more likely to develop high blood pressure: Smoking. Not getting enough exercise or physical activity. Being overweight. Having too much fat, sugar, calories, or salt (sodium) in your diet. Drinking too much alcohol. Other risk factors include: Having any of these conditions: Heart disease. Diabetes. High cholesterol. Kidney disease. Obstructive sleep apnea. Having a family history of high blood pressure and high cholesterol. Age. The risk increases with age. Stress. What are the signs or symptoms? High blood pressure may not cause symptoms. Very high blood pressure (hypertensive crisis) may cause: Headache. Fast or uneven heartbeats (palpitations). Shortness of breath. Nosebleed. Vomiting or feeling like you may vomit (nauseous). Changes in how you see. Very bad chest pain. Feeling dizzy. Seizures. How is this treated? This condition is treated by making healthy lifestyle changes, such as: Eating healthy foods. Exercising more. Drinking less alcohol. Your doctor may prescribe medicine if lifestyle changes do not help enough and if: Your top number is above 130. Your bottom number is above 80. Your personal target blood pressure may vary. Follow these instructions at home: Eating and drinking  If told, follow the DASH eating plan. To follow this plan: Fill one half of your plate at each  meal with fruits and vegetables. Fill one fourth of your plate at each meal with whole grains. Whole grains include whole-wheat pasta, brown rice, and whole-grain bread. Eat or drink low-fat dairy products, such as skim milk or low-fat yogurt. Fill one fourth of your plate at each meal with low-fat (lean) proteins. Low-fat proteins include fish, chicken without skin, eggs, beans, and tofu. Avoid fatty meat, cured and processed meat, or chicken with skin. Avoid pre-made or processed food. Limit the amount of salt in your diet to less than 1,500 mg each day. Do not drink alcohol if: Your doctor tells you not to drink. You are pregnant, may be pregnant, or are planning to become pregnant. If you drink alcohol: Limit how much you have to: 0-1 drink a day for women. 0-2 drinks a day for men. Know how much alcohol is in your drink. In the [place] one drink equals one 12 oz bottle of beer (355 mL), one 5 oz glass of wine (148 mL), or one 1 oz glass of hard liquor (44 mL). Lifestyle  Work with your doctor to stay at a healthy weight or to lose weight. Ask your doctor what the best weight is for you. Get at least 30 minutes of exercise that causes your heart to beat faster (aerobic exercise) most days of the week. This may include walking, swimming, or biking. Get at least 30 minutes of exercise that strengthens your muscles (resistance exercise) at least 3 days a week. This may include lifting weights or doing Pilates. Do not smoke or use any products that contain nicotine or tobacco. If you need help quitting, ask your doctor. Check your blood pressure at home as told by your doctor.  Keep all follow-up visits. Medicines Take over-the-counter and prescription medicines only as told by your doctor. Follow directions carefully. Do not skip doses of blood pressure medicine. The medicine does not work as well if you skip doses. Skipping doses also puts you at risk for problems. Ask your doctor about  side effects or reactions to medicines that you should watch for. Contact a doctor if: You think you are having a reaction to the medicine you are taking. You have headaches that keep coming back. You feel dizzy. You have swelling in your ankles. You have trouble with your vision. Get help right away if: You get a very bad headache. You start to feel mixed up (confused). You feel weak or numb. You feel faint. You have very bad pain in your: Chest. Belly (abdomen). You vomit more than once. You have trouble breathing. These symptoms may be an emergency. Get help right away. Call 911. Do not wait to see if the symptoms will go away. Do not drive yourself to the hospital. Summary Hypertension is another name for high blood pressure. High blood pressure forces your heart to work harder to pump blood. For most people, a normal blood pressure is less than 120/80. Making healthy choices can help lower blood pressure. If your blood pressure does not get lower with healthy choices, you may need to take medicine. This information is not intended to replace advice given to you by your health care provider. Make sure you discuss any questions you have with your health care provider. Document Revised: 12/14/2020 Document Reviewed: 12/14/2020 Elsevier Patient Education  2023 [employer].

## 2022-06-25 ENCOUNTER — Other Ambulatory Visit: Payer: Medicare Other

## 2022-06-25 ENCOUNTER — Other Ambulatory Visit: Payer: Self-pay | Admitting: Nurse Practitioner

## 2022-06-25 DIAGNOSIS — R718 Other abnormality of red blood cells: Secondary | ICD-10-CM

## 2022-06-25 DIAGNOSIS — E1122 Type 2 diabetes mellitus with diabetic chronic kidney disease: Secondary | ICD-10-CM

## 2022-06-25 LAB — COMPLETE METABOLIC PANEL WITH GFR
AG Ratio: 1.2 (calc) (ref 1.0–2.5)
ALT: 16 U/L (ref 9–46)
AST: 13 U/L (ref 10–35)
Albumin: 3.6 g/dL (ref 3.6–5.1)
Alkaline phosphatase (APISO): 80 U/L (ref 35–144)
BUN: 16 mg/dL (ref 7–25)
CO2: 28 mmol/L (ref 20–32)
Calcium: 9.2 mg/dL (ref 8.6–10.3)
Chloride: 97 mmol/L — ABNORMAL LOW (ref 98–110)
Creat: 1.04 mg/dL (ref 0.70–1.28)
Globulin: 3.1 g/dL (calc) (ref 1.9–3.7)
Glucose, Bld: 279 mg/dL — ABNORMAL HIGH (ref 65–99)
Potassium: 4.8 mmol/L (ref 3.5–5.3)
Sodium: 135 mmol/L (ref 135–146)
Total Bilirubin: 0.4 mg/dL (ref 0.2–1.2)
Total Protein: 6.7 g/dL (ref 6.1–8.1)
eGFR: 75 mL/min/{1.73_m2} (ref >=60)

## 2022-06-25 LAB — LIPID PANEL
Cholesterol: 130 mg/dL (ref <200)
HDL: 37 mg/dL — ABNORMAL LOW (ref >=40)
LDL Cholesterol (Calc): 71 mg/dL (calc)
Non-HDL Cholesterol (Calc): 93 mg/dL (calc) (ref <130)
Total CHOL/HDL Ratio: 3.5 (calc) (ref <5.0)
Triglycerides: 140 mg/dL (ref <150)

## 2022-06-25 LAB — HEMOGLOBIN A1C
Hgb A1c MFr Bld: 13.2 % of total Hgb — ABNORMAL HIGH (ref <5.7)
Mean Plasma Glucose: 332 mg/dL
eAG (mmol/L): 18.4 mmol/L

## 2022-06-25 LAB — CBC WITH DIFFERENTIAL/PLATELET
Basophils Absolute: 74 cells/uL (ref 0–200)
Basophils Relative: 0.8 %
Eosinophils Relative: 2.1 %
HCT: 39 % (ref 38.5–50.0)
Lymphs Abs: 1251 cells/uL (ref 850–3900)
MCHC: 29.2 g/dL — ABNORMAL LOW (ref 32.0–36.0)
MCV: 67 fL — ABNORMAL LOW (ref 80.0–100.0)
Neutro Abs: 6790 cells/uL (ref 1500–7800)
Neutrophils Relative %: 73.8 %
RBC: 5.82 10*6/uL — ABNORMAL HIGH (ref 4.20–5.80)
RDW: 16.3 % — ABNORMAL HIGH (ref 11.0–15.0)
Total Lymphocyte: 13.6 %
WBC: 9.2 10*3/uL (ref 3.8–10.8)

## 2022-06-25 LAB — TSH: TSH: 1.45 mIU/L (ref 0.40–4.50)

## 2022-06-25 LAB — CBC MORPHOLOGY

## 2022-06-25 MED ORDER — GLIPIZIDE 5 MG PO TABS
ORAL_TABLET | ORAL | 11 refills | Status: AC
Start: 2022-06-25 — End: ?

## 2022-06-27 ENCOUNTER — Inpatient Hospital Stay: Payer: Medicare Other | Attending: Hematology | Admitting: Hematology

## 2022-06-27 ENCOUNTER — Inpatient Hospital Stay: Payer: Medicare Other

## 2022-06-27 VITALS — BP 120/69 | HR 94 | Temp 97.5°F | Resp 17 | Wt 109.6 lb

## 2022-06-27 DIAGNOSIS — R634 Abnormal weight loss: Secondary | ICD-10-CM | POA: Insufficient documentation

## 2022-06-27 DIAGNOSIS — Z87891 Personal history of nicotine dependence: Secondary | ICD-10-CM | POA: Insufficient documentation

## 2022-06-27 DIAGNOSIS — R140 Abdominal distension (gaseous): Secondary | ICD-10-CM | POA: Insufficient documentation

## 2022-06-27 DIAGNOSIS — D509 Iron deficiency anemia, unspecified: Secondary | ICD-10-CM

## 2022-06-27 DIAGNOSIS — Z7984 Long term (current) use of oral hypoglycemic drugs: Secondary | ICD-10-CM | POA: Diagnosis not present

## 2022-06-27 DIAGNOSIS — Z79899 Other long term (current) drug therapy: Secondary | ICD-10-CM | POA: Diagnosis not present

## 2022-06-27 DIAGNOSIS — D751 Secondary polycythemia: Secondary | ICD-10-CM | POA: Diagnosis not present

## 2022-06-27 DIAGNOSIS — N182 Chronic kidney disease, stage 2 (mild): Secondary | ICD-10-CM | POA: Diagnosis not present

## 2022-06-27 DIAGNOSIS — E1122 Type 2 diabetes mellitus with diabetic chronic kidney disease: Secondary | ICD-10-CM | POA: Diagnosis not present

## 2022-06-27 DIAGNOSIS — I129 Hypertensive chronic kidney disease with stage 1 through stage 4 chronic kidney disease, or unspecified chronic kidney disease: Secondary | ICD-10-CM | POA: Insufficient documentation

## 2022-06-27 DIAGNOSIS — E785 Hyperlipidemia, unspecified: Secondary | ICD-10-CM | POA: Diagnosis not present

## 2022-06-27 DIAGNOSIS — R1013 Epigastric pain: Secondary | ICD-10-CM

## 2022-06-27 LAB — CBC WITH DIFFERENTIAL (CANCER CENTER ONLY)
Abs Immature Granulocytes: 0.23 10*3/uL — ABNORMAL HIGH (ref 0.00–0.07)
Basophils Absolute: 0.1 10*3/uL (ref 0.0–0.1)
Basophils Relative: 1 %
Eosinophils Absolute: 0.2 10*3/uL (ref 0.0–0.5)
Eosinophils Relative: 2 %
HCT: 34.4 % — ABNORMAL LOW (ref 39.0–52.0)
Hemoglobin: 10.9 g/dL — ABNORMAL LOW (ref 13.0–17.0)
Immature Granulocytes: 3 %
Lymphocytes Relative: 13 %
Lymphs Abs: 1.1 10*3/uL (ref 0.7–4.0)
MCH: 19.8 pg — ABNORMAL LOW (ref 26.0–34.0)
MCHC: 31.7 g/dL (ref 30.0–36.0)
MCV: 62.4 fL — ABNORMAL LOW (ref 80.0–100.0)
Monocytes Absolute: 0.5 10*3/uL (ref 0.1–1.0)
Monocytes Relative: 6 %
Neutro Abs: 6.4 10*3/uL (ref 1.7–7.7)
Neutrophils Relative %: 75 %
Platelet Count: 494 10*3/uL — ABNORMAL HIGH (ref 150–400)
RBC: 5.51 MIL/uL (ref 4.22–5.81)
RDW: 16.9 % — ABNORMAL HIGH (ref 11.5–15.5)
Smear Review: NORMAL
WBC Count: 8.5 10*3/uL (ref 4.0–10.5)
nRBC: 0 % (ref 0.0–0.2)

## 2022-06-27 LAB — CMP (CANCER CENTER ONLY)
ALT: 30 U/L (ref 0–44)
AST: 27 U/L (ref 15–41)
Albumin: 3.7 g/dL (ref 3.5–5.0)
Alkaline Phosphatase: 77 U/L (ref 38–126)
Anion gap: 7 (ref 5–15)
BUN: 13 mg/dL (ref 8–23)
CO2: 27 mmol/L (ref 22–32)
Calcium: 9.8 mg/dL (ref 8.9–10.3)
Chloride: 102 mmol/L (ref 98–111)
Creatinine: 0.99 mg/dL (ref 0.61–1.24)
GFR, Estimated: >60 mL/min (ref >60)
Glucose, Bld: 194 mg/dL — ABNORMAL HIGH (ref 70–99)
Potassium: 4.9 mmol/L (ref 3.5–5.1)
Sodium: 136 mmol/L (ref 135–145)
Total Bilirubin: 0.4 mg/dL (ref 0.3–1.2)
Total Protein: 7.3 g/dL (ref 6.5–8.1)

## 2022-06-27 LAB — IRON AND IRON BINDING CAPACITY (CC-WL,HP ONLY)
Iron: 59 ug/dL (ref 45–182)
Saturation Ratios: 21 % (ref 17.9–39.5)
TIBC: 286 ug/dL (ref 250–450)
UIBC: 227 ug/dL (ref 117–376)

## 2022-06-27 LAB — FERRITIN: Ferritin: 92 ng/mL (ref 24–336)

## 2022-06-27 LAB — LACTATE DEHYDROGENASE: LDH: 110 U/L (ref 98–192)

## 2022-06-27 LAB — VITAMIN B12: Vitamin B-12: 409 pg/mL (ref 180–914)

## 2022-06-27 MED ORDER — ESOMEPRAZOLE MAGNESIUM 40 MG PO CPDR: 40.0000 mg | DELAYED_RELEASE_CAPSULE | Freq: Every day | ORAL | 0 refills | Status: DC

## 2022-06-27 NOTE — Progress Notes (Signed)
HEMATOLOGY/ONCOLOGY CONSULTATION NOTE  Date of Service: 06/27/2022  Patient Care Team: Lucky Cowboy, MD as PCP - General (Internal Medicine) Rollene Rotunda, MD as PCP - Cardiology (Cardiology) Marzella Schlein., MD as Consulting Physician (Ophthalmology) Rollene Rotunda, MD as Consulting Physician (Cardiology) Vida Rigger, MD as Consulting Physician (Gastroenterology) Jodi Geralds, MD as Consulting Physician (Orthopedic Surgery)  CHIEF COMPLAINTS/PURPOSE OF CONSULTATION:  Evaluation and management of abnormal red blood cells. (Microcytic relative polycythemia)  HISTORY OF PRESENTING ILLNESS:   William Haney is a wonderful 72 y.o. male who has been referred to [place] by Lucky Cowboy, MD for evaluation and management of abnormal red blood cells.   Today, he is accompanied by his daughter. He complains of fluctuating stomach symptoms over the past 3-4 months. His symptoms include bloating, gas, increased hunger, loss of appetite, and cramping. He does also endorse a prolonged sense of fullness after meals and irregular bowel habits, but has normal stools. He denies any black stools or blood in stools. Patient has lost 10 pounds over the last 3-4 months. He denies any ulcers or gastritis in the past. He does not notice symptoms with certain foods. He denies any stressful triggers. His daughter reports that patient's current diet consists of soups/salad and lacks protein.  Patient does endorse general upper abdominal pain on palpation.  His abdominal pain is intermittent and he notices pain when not eating. He denies any lower abdominal pain, leg edema, or change in urination habits. He denies any previous gallbladder/liver issues. Patient is a previous smoker, he discontinued in 2007, prior to receiving a bypass surgery.   Patient does confirm that he has had DM for 20 years. He does have tingling/numbness in two fingers of his right hand. Patient does take Ozempic generally, but has not for  the last couple of months. Patient has been on Metformin for 20 years and denies taking any new medications. Patient continues to lose weight. His weight in clinic today is 109 pounds.   Patient denies any blood disorders in the past or in the family. Patient reports having a normal routine colonoscopy/endoscopy 13-14 years ago. Patient denies any new fatigue or change in energy level. Patient does take a multivitamin on a regular basis.   MEDICAL HISTORY:  Past Medical History:  Diagnosis Date   Acute metabolic encephalopathy 06/08/2020   Anemia    Carotid artery stenosis 01/2017   L-ICA 1-39%, R-ICA 40-59% doppler   Coronary artery disease    s/p CABG in 2008, w/ LIMA-LAD, SVG-Diag, SVG-OM, SVG-PL   Hypertension    Vitamin D deficiency   DM2 Ex smoker  SURGICAL HISTORY: Past Surgical History:  Procedure Laterality Date   CORONARY ARTERY BYPASS GRAFT  2008   LIMA to the LAD, saphenous vein graft to diagonal, saphenous vein graft to OM, saphenous vein graft to posterolateral 2008   TOTAL KNEE ARTHROPLASTY Left 12/18/2018   Procedure: TOTAL KNEE ARTHROPLASTY;  Surgeon: Jodi Geralds, MD;  Location: [facility];  Service: Orthopedics;  Laterality: Left;    SOCIAL HISTORY: Social History   Socioeconomic History   Marital status: Married    Spouse name: Not on file   Number of children: Not on file   Years of education: Not on file   Highest education level: Not on file  Occupational History   Occupation: retired  Tobacco Use   Smoking status: Former    Packs/day: 1.00    Years: 34.00    Additional pack years: 0.00    Total  pack years: 34.00    Types: Cigarettes    Start date: 62    Quit date: 08/28/2005    Years since quitting: 16.8   Smokeless tobacco: Never  Vaping Use   Vaping Use: Never used  Substance and Sexual Activity   Alcohol use: No   Drug use: No   Sexual activity: Yes  Other Topics Concern   Not on file  Social History Narrative   Not on file   Social  Determinants of Health   Financial Resource Strain: Not on file  Food Insecurity: Not on file  Transportation Needs: Not on file  Physical Activity: Not on file  Stress: Not on file  Social Connections: Not on file  Intimate Partner Violence: Not on file    FAMILY HISTORY: Family History  Problem Relation Age of Onset   Hypertension Father    Heart disease Father    Hyperlipidemia Father    Heart attack Father    Diabetes Sister    Heart disease Brother     ALLERGIES:  is allergic to ace inhibitors and statins.  MEDICATIONS:  Current Outpatient Medications  Medication Sig Dispense Refill   glipiZIDE (GLUCOTROL) 5 MG tablet Take twice a day with meals if blood sugar is over 120 60 tablet 11   aspirin EC 81 MG tablet Take 81 mg by mouth daily. Swallow whole.     blood glucose meter kit and supplies Test sugars once to twice daily 1 each 0   Cholecalciferol (VITAMIN D3) 125 MCG (5000 UT) CAPS Take 5,000 Units by mouth daily.     co-enzyme Q-10 30 MG capsule Take 30 mg by mouth 3 (three) times daily.     dicyclomine (BENTYL) 20 MG tablet TAKE 1/2 TO 1 TABLET 4 X /DAY BEFORE MEALS & BEDTIME FOR NAUSEA, CRAMPING, BLOATING OR DIARRHEA 300 tablet 1   empagliflozin (JARDIANCE) 25 MG TABS tablet Take 1 tablet (25 mg total) by mouth daily. 90 tablet 3   metFORMIN (GLUCOPHAGE) 500 MG tablet TAKE 2 TABLETS BY MOUTH TWICE A DAY WITH MEALS FOR DIABETES 360 tablet 3   Multiple Vitamin (MULTIVITAMIN) tablet Take 1 tablet by mouth daily.     rosuvastatin (CRESTOR) 20 MG tablet Take 1 tablet (20 mg total) by mouth daily. 90 tablet 3   telmisartan (MICARDIS) 20 MG tablet TAKE 1 TABLET BY MOUTH DAILY FOR BLOOD PRESSURE AND KIDNEY PROTECTION 90 tablet 3   No current facility-administered medications for this visit.    REVIEW OF SYSTEMS:    10 Point review of Systems was done is negative except as noted above.  PHYSICAL EXAMINATION: ECOG PERFORMANCE STATUS: 2 - Symptomatic, <50% confined to  bed  . Vitals:   06/27/22 1055  BP: 120/69  Pulse: 94  Resp: 17  Temp: (!) 97.5 F (36.4 C)  SpO2: 100%   Filed Weights   06/27/22 1055  Weight: 109 lb 9.6 oz (49.7 kg)   .Body mass index is 21.4 kg/m.  GENERAL:alert, in no acute distress and comfortable SKIN: no acute rashes, no significant lesions EYES: conjunctiva are pink and non-injected, sclera anicteric OROPHARYNX: MMM, no exudates, no oropharyngeal erythema or ulceration NECK: supple, no JVD LYMPH:  no palpable lymphadenopathy in the cervical, axillary or inguinal regions LUNGS: clear to auscultation b/l with normal respiratory effort HEART: regular rate & rhythm ABDOMEN:  normoactive bowel sounds , non tender, not distended. Extremity: no pedal edema PSYCH: alert & oriented x 3 with fluent speech NEURO: no focal motor/sensory  deficits  LABORATORY DATA:  I have reviewed the data as listed  .    Latest Ref Rng & Units 06/27/2022   12:04 PM 06/27/2022   12:03 PM 06/24/2022   11:31 AM  CBC  WBC 4.0 - 10.5 K/uL  8.5  9.2   Hemoglobin 13.0 - 17.0 g/dL  78.2  95.6   Hematocrit 37.5 - 51.0 % 36.9  34.4  39.0   Platelets 150 - 400 K/uL  494  427    .    Latest Ref Rng & Units 06/27/2022   12:03 PM 06/24/2022   11:31 AM 01/10/2022   12:00 AM  CMP  Glucose 70 - 99 mg/dL 213  086  578   BUN 8 - 23 mg/dL Creatinine 0.61 - 1.24 mg/dL 4.69  6.29  5.28   Sodium 135 - 145 mmol/L 136  135  136   Potassium 3.5 - 5.1 mmol/L 4.9  4.8  4.8   Chloride 98 - 111 mmol/L 102  97  101   CO2 22 - 32 mmol/L Calcium 8.9 - 10.3 mg/dL 9.8  9.2  9.1   Total Protein 6.5 - 8.1 g/dL 7.3  6.7  6.6   Total Bilirubin 0.3 - 1.2 mg/dL 0.4  0.4  0.6   Alkaline Phos 38 - 126 U/L 77     AST 15 - 41 U/L ALT 0 - 44 U/L .CBC    Component Value Date/Time   WBC 8.5 06/27/2022 1203   WBC 9.2 06/24/2022 1131   RBC 5.51 06/27/2022 1203   HGB 10.9 (L) 06/27/2022 1203   HCT 36.9 (L)  06/27/2022 1204   HCT 34.4 (L) 06/27/2022 1203   PLT 494 (H) 06/27/2022 1203   MCV 62.4 (L) 06/27/2022 1203   MCH 19.8 (L) 06/27/2022 1203   MCHC 31.7 06/27/2022 1203   RDW 16.9 (H) 06/27/2022 1203   LYMPHSABS 1.1 06/27/2022 1203   MONOABS 0.5 06/27/2022 1203   EOSABS 0.2 06/27/2022 1203   BASOSABS 0.1 06/27/2022 1203     RADIOGRAPHIC STUDIES: I have personally reviewed the radiological images as listed and agreed with the findings in the report. No results found.  ASSESSMENT & PLAN:   Wonderful 72 y.o. male of [place] descent with:  Microcytic Anemia with relative polycythemia -- likely beta thalassemia minor/trait r/o additional Iron deficiency Anemia. Type 2 diabetes mellitus with stage 2 chronic kidney disease, without long-term current use of insulin  Essential hypertension Hyperlipidemia with Type 2 Diabetes Mellitus  Dyspeptic symptoms being evaluated by GI -- will need w/u ? PUD vs gastroparesis vs exocrine pancreatic inssufficiency vs medication side effects  PLAN: -Discussed lab results on 06/24/2022 with patient. CBC showed WBC of 9.2K, hemoglobin of 11.4, and platelets of 427K. -mild anemia -Microcytosis and poikilocytosis  -Patient's RBC is 60 femtoliters  -discussed that iron deficiency is the most common cause of abnormal RBCs. However, patient is not too anemic at this time. There is suspicion for hemoglobin variant, beta thalasemia.  -will order blood tests to evaluate for any abnormal hemoglobin or iron deficiency -recommend patient to FU with gastroenterologist to check for diseases such as gastritis or ulcer -Gastroenterologists may considers gastric emptying study -discussed that patient may not make enough digestive enzymes. -Gastroenterologist may consider enzyme supplements. -patient has no obvious bone marrow problems at this  time -discussed that Metformin may cause a vitamin B-12 deficiency, which may cause abnormal RBCs -Patient is scheduled to  receive an endoscopy soon -informed patient that Jardiance, metformin, Trulicity, and Ozempic may cause weight loss -Patient shall regularly follow-up with PCP for optimal management and evaluation of DM -recommend patient to regularly consume adequate levels of meat in his diet to optimize protein, as well as regularly consume whole grains -Recommended smaller and more frequent meals to improve bloating -Will start patient on acid suppressant to improve abdominal pain -Patient would prefer to hold off on receiving CT scan until he is seen by gastroenterologist  FOLLOW-UP: Labs today Phone visit with Dr Candise Che in 2 weeks  The total time spent in the appointment was 60 minutes* .  All of the patient's questions were answered with apparent satisfaction. The patient knows to call the clinic with any problems, questions or concerns.   Wyvonnia Lora MD MS AAHIVMS [facility] Hematology/Oncology Physician [facility]  .*Total Encounter Time as defined by the Centers for Medicare and Medicaid Services includes, in addition to the face-to-face time of a patient visit (documented in the note above) non-face-to-face time: obtaining and reviewing outside history, ordering and reviewing medications, tests or procedures, care coordination (communications with other health care professionals or caregivers) and documentation in the medical record.    I,Mitra Faeizi,acting as a Neurosurgeon for Wyvonnia Lora, MD.,have documented all relevant documentation on the behalf of Wyvonnia Lora, MD,as directed by  Wyvonnia Lora, MD while in the presence of Wyvonnia Lora, MD.  .I have reviewed the above documentation for accuracy and completeness, and I agree with the above. Johney Maine MD

## 2022-06-28 ENCOUNTER — Telehealth: Payer: Self-pay | Admitting: Hematology

## 2022-06-28 LAB — FOLATE RBC
Folate, Hemolysate: 611 ng/mL
Folate, RBC: 1656 ng/mL (ref >498)
Hematocrit: 36.9 % — ABNORMAL LOW (ref 37.5–51.0)

## 2022-07-02 LAB — HGB FRACTIONATION CASCADE
Hgb A2: 5.2 % — ABNORMAL HIGH (ref 1.8–3.2)
Hgb A: 94.8 % — ABNORMAL LOW (ref 96.4–98.8)
Hgb F: 0 % (ref 0.0–2.0)
Hgb S: 0 %

## 2022-07-04 LAB — MULTIPLE MYELOMA PANEL, SERUM
Albumin SerPl Elph-Mcnc: 3.1 g/dL (ref 2.9–4.4)
Albumin/Glob SerPl: 0.9 (ref 0.7–1.7)
Alpha 1: 0.3 g/dL (ref 0.0–0.4)
Alpha2 Glob SerPl Elph-Mcnc: 1.2 g/dL — ABNORMAL HIGH (ref 0.4–1.0)
B-Globulin SerPl Elph-Mcnc: 1 g/dL (ref 0.7–1.3)
Gamma Glob SerPl Elph-Mcnc: 1.1 g/dL (ref 0.4–1.8)
Globulin, Total: 3.5 g/dL (ref 2.2–3.9)
IgA: 235 mg/dL (ref 61–437)
IgG (Immunoglobin G), Serum: 1114 mg/dL (ref 603–1613)
IgM (Immunoglobulin M), Srm: 52 mg/dL (ref 15–143)
M Protein SerPl Elph-Mcnc: 0.5 g/dL — ABNORMAL HIGH
Total Protein ELP: 6.6 g/dL (ref 6.0–8.5)

## 2022-07-16 ENCOUNTER — Inpatient Hospital Stay: Payer: Medicare Other | Attending: Hematology | Admitting: Hematology

## 2022-07-16 DIAGNOSIS — D472 Monoclonal gammopathy: Secondary | ICD-10-CM | POA: Diagnosis not present

## 2022-07-16 DIAGNOSIS — D509 Iron deficiency anemia, unspecified: Secondary | ICD-10-CM | POA: Diagnosis not present

## 2022-07-16 NOTE — Progress Notes (Signed)
HEMATOLOGY/ONCOLOGY PHONE VISIT NOTE  Date of Service: 07/16/2022  Patient Care Team: Lucky Cowboy, MD as PCP - General (Internal Medicine) Rollene Rotunda, MD as PCP - Cardiology (Cardiology) Marzella Schlein., MD as Consulting Physician (Ophthalmology) Rollene Rotunda, MD as Consulting Physician (Cardiology) Vida Rigger, MD as Consulting Physician (Gastroenterology) Jodi Geralds, MD as Consulting Physician (Orthopedic Surgery)  CHIEF COMPLAINTS/PURPOSE OF CONSULTATION:  Evaluation and management of abnormal red blood cells. (Microcytic relative polycythemia)  HISTORY OF PRESENTING ILLNESS:   Gaspar Cusano is a wonderful 72 y.o. male who has been referred to [place] by Lucky Cowboy, MD for evaluation and management of abnormal red blood cells.   Today, he is accompanied by his daughter. He complains of fluctuating stomach symptoms over the past 3-4 months. His symptoms include bloating, gas, increased hunger, loss of appetite, and cramping. He does also endorse a prolonged sense of fullness after meals and irregular bowel habits, but has normal stools. He denies any black stools or blood in stools. Patient has lost 10 pounds over the last 3-4 months. He denies any ulcers or gastritis in the past. He does not notice symptoms with certain foods. He denies any stressful triggers. His daughter reports that patient's current diet consists of soups/salad and lacks protein.  Patient does endorse general upper abdominal pain on palpation.  His abdominal pain is intermittent and he notices pain when not eating. He denies any lower abdominal pain, leg edema, or change in urination habits. He denies any previous gallbladder/liver issues. Patient is a previous smoker, he discontinued in 2007, prior to receiving a bypass surgery.   Patient does confirm that he has had DM for 20 years. He does have tingling/numbness in two fingers of his right hand. Patient does take Ozempic generally, but has not for  the last couple of months. Patient has been on Metformin for 20 years and denies taking any new medications. Patient continues to lose weight. His weight in clinic today is 109 pounds.   Patient denies any blood disorders in the past or in the family. Patient reports having a normal routine colonoscopy/endoscopy 13-14 years ago. Patient denies any new fatigue or change in energy level. Patient does take a multivitamin on a regular basis.   INTERVAL HISTORY: Ingrid Yanik is a wonderful 72 y.o. male who is connected via phone for evaluation and management of abnormal red blood cells. Patient was initially seen by me on 06/27/2022. .I connected with Darlen Round on 07/16/2022 at  8:40 AM EDT by telephone visit and verified that I am speaking with the correct person using two identifiers.   Patient reports he has been doing well overall without any new or severe medical concerns since our last visit. He denies fever, chills, night sweats, infection issues, abdominal pain, back pain, chest pain, or leg swelling.   We discussed patient's lab results in detail.   I discussed the limitations, risks, security and privacy concerns of performing an evaluation and management service by telemedicine and the availability of in-person appointments. I also discussed with the patient that there may be a patient responsible charge related to this service. The patient expressed understanding and agreed to proceed.   Other persons participating in the visit and their role in the encounter: None   Patient's location: Home  Provider's location: [facility]   Chief Complaint: Elevated RBC     MEDICAL HISTORY:  Past Medical History:  Diagnosis Date   Acute metabolic encephalopathy 06/08/2020   Anemia  Carotid artery stenosis 01/2017   L-ICA 1-39%, R-ICA 40-59% doppler   Coronary artery disease    s/p CABG in 2008, w/ LIMA-LAD, SVG-Diag, SVG-OM, SVG-PL   Hypertension    Vitamin D deficiency   DM2 Ex  smoker  SURGICAL HISTORY: Past Surgical History:  Procedure Laterality Date   CORONARY ARTERY BYPASS GRAFT  2008   LIMA to the LAD, saphenous vein graft to diagonal, saphenous vein graft to OM, saphenous vein graft to posterolateral 2008   TOTAL KNEE ARTHROPLASTY Left 12/18/2018   Procedure: TOTAL KNEE ARTHROPLASTY;  Surgeon: Jodi Geralds, MD;  Location: [facility];  Service: Orthopedics;  Laterality: Left;    SOCIAL HISTORY: Social History   Socioeconomic History   Marital status: Married    Spouse name: Not on file   Number of children: Not on file   Years of education: Not on file   Highest education level: Not on file  Occupational History   Occupation: retired  Tobacco Use   Smoking status: Former    Packs/day: 1.00    Years: 34.00    Additional pack years: 0.00    Total pack years: 34.00    Types: Cigarettes    Start date: 17    Quit date: 08/28/2005    Years since quitting: 16.8   Smokeless tobacco: Never  Vaping Use   Vaping Use: Never used  Substance and Sexual Activity   Alcohol use: No   Drug use: No   Sexual activity: Yes  Other Topics Concern   Not on file  Social History Narrative   Not on file   Social Determinants of Health   Financial Resource Strain: Not on file  Food Insecurity: Food Insecurity Present (06/27/2022)   Hunger Vital Sign    Worried About Running Out of Food in the Last Year: Sometimes true    Ran Out of Food in the Last Year: Never true  Transportation Needs: No Transportation Needs (06/27/2022)   PRAPARE - Administrator, Civil Service (Medical): No    Lack of Transportation (Non-Medical): No  Physical Activity: Not on file  Stress: Not on file  Social Connections: Not on file  Intimate Partner Violence: Not At Risk (06/27/2022)   Humiliation, Afraid, Rape, and Kick questionnaire    Fear of Current or Ex-Partner: No    Emotionally Abused: No    Physically Abused: No    Sexually Abused: No    FAMILY  HISTORY: Family History  Problem Relation Age of Onset   Hypertension Father    Heart disease Father    Hyperlipidemia Father    Heart attack Father    Diabetes Sister    Heart disease Brother     ALLERGIES:  is allergic to ace inhibitors and statins.  MEDICATIONS:  Current Outpatient Medications  Medication Sig Dispense Refill   glipiZIDE (GLUCOTROL) 5 MG tablet Take twice a day with meals if blood sugar is over 120 60 tablet 11   aspirin EC 81 MG tablet Take 81 mg by mouth daily. Swallow whole.     blood glucose meter kit and supplies Test sugars once to twice daily 1 each 0   Cholecalciferol (VITAMIN D3) 125 MCG (5000 UT) CAPS Take 5,000 Units by mouth daily.     co-enzyme Q-10 30 MG capsule Take 30 mg by mouth 3 (three) times daily.     dicyclomine (BENTYL) 20 MG tablet TAKE 1/2 TO 1 TABLET 4 X /DAY BEFORE MEALS & BEDTIME FOR NAUSEA, CRAMPING,  BLOATING OR DIARRHEA 300 tablet 1   empagliflozin (JARDIANCE) 25 MG TABS tablet Take 1 tablet (25 mg total) by mouth daily. 90 tablet 3   esomeprazole (NEXIUM) 40 MG capsule Take 1 capsule (40 mg total) by mouth daily before breakfast. 30 capsule 0   metFORMIN (GLUCOPHAGE) 500 MG tablet TAKE 2 TABLETS BY MOUTH TWICE A DAY WITH MEALS FOR DIABETES 360 tablet 3   Multiple Vitamin (MULTIVITAMIN) tablet Take 1 tablet by mouth daily.     rosuvastatin (CRESTOR) 20 MG tablet Take 1 tablet (20 mg total) by mouth daily. 90 tablet 3   telmisartan (MICARDIS) 20 MG tablet TAKE 1 TABLET BY MOUTH DAILY FOR BLOOD PRESSURE AND KIDNEY PROTECTION 90 tablet 3   No current facility-administered medications for this visit.    REVIEW OF SYSTEMS:    10 Point review of Systems was done is negative except as noted above.  PHYSICAL EXAMINATION: TELE-MED VISIT  LABORATORY DATA:  I have reviewed the data as listed  .    Latest Ref Rng & Units 06/27/2022   12:04 PM 06/27/2022   12:03 PM 06/24/2022   11:31 AM  CBC  WBC 4.0 - 10.5 K/uL  8.5  9.2   Hemoglobin  13.0 - 17.0 g/dL  13.0  86.5   Hematocrit 37.5 - 51.0 % 36.9  34.4  39.0   Platelets 150 - 400 K/uL  494  427    .    Latest Ref Rng & Units 06/27/2022   12:03 PM 06/24/2022   11:31 AM 01/10/2022   12:00 AM  CMP  Glucose 70 - 99 mg/dL 784  696  295   BUN 8 - 23 mg/dL 13  16  22    Creatinine 0.61 - 1.24 mg/dL 2.84  1.32  4.40   Sodium 135 - 145 mmol/L 136  135  136   Potassium 3.5 - 5.1 mmol/L 4.9  4.8  4.8   Chloride 98 - 111 mmol/L 102  97  101   CO2 22 - 32 mmol/L 27  28  30    Calcium 8.9 - 10.3 mg/dL 9.8  9.2  9.1   Total Protein 6.5 - 8.1 g/dL 7.3  6.7  6.6   Total Bilirubin 0.3 - 1.2 mg/dL 0.4  0.4  0.6   Alkaline Phos 38 - 126 U/L 77     AST 15 - 41 U/L 27  13  18    ALT 0 - 44 U/L 30  16  24     .CBC    Component Value Date/Time   WBC 8.5 06/27/2022 1203   WBC 9.2 06/24/2022 1131   RBC 5.51 06/27/2022 1203   HGB 10.9 (L) 06/27/2022 1203   HCT 36.9 (L) 06/27/2022 1204   HCT 34.4 (L) 06/27/2022 1203   PLT 494 (H) 06/27/2022 1203   MCV 62.4 (L) 06/27/2022 1203   MCH 19.8 (L) 06/27/2022 1203   MCHC 31.7 06/27/2022 1203   RDW 16.9 (H) 06/27/2022 1203   LYMPHSABS 1.1 06/27/2022 1203   MONOABS 0.5 06/27/2022 1203   EOSABS 0.2 06/27/2022 1203   BASOSABS 0.1 06/27/2022 1203   . Lab Results  Component Value Date   IRON 59 06/27/2022   TIBC 286 06/27/2022   IRONPCTSAT 21 06/27/2022   (Iron and TIBC)  Lab Results  Component Value Date   FERRITIN 92 06/27/2022   Component     Latest Ref Rng 06/27/2022  Folate, Hemolysate     Not Estab. ng/mL 611.0  HCT     37.5 - 51.0 % 36.9 (L)   Folate, RBC     >498 ng/mL 1,656   Vitamin B12     180 - 914 pg/mL 409   LDH     98 - 192 U/L 110     Legend: (L) Low  Contains abnormal data Hgb Fractionation Cascade Order: [id number] Status: Final result     Visible to patient: Yes (not seen)     Next appt: 10/21/2022 at 09:00 AM in Internal Medicine (DANA Hollie Salk, NP)     Dx: Microcytic anemia   0 Result Notes      Component Ref Range & Units 3 wk ago 4 yr ago  Hgb F 0.0 - 2.0 % 0.0   Hgb A 96.4 - 98.8 % 94.8 Low  94.3 Low  R  Hgb A2 1.8 - 3.2 % 5.2 High    Hgb S 0.0 % 0.0   Interpretation, Hgb Fract Comment   Comment: (NOTE) Hemoglobin pattern and concentrations are consistent with beta- Thalassemia minor. Suggest hematologic and clinical correlation. Performed At: [facility] [address] [place], [place] [id number]     Cristine Polio Order: [id number] Status: Final result     Visible to patient: Yes (not seen)     Next appt: 10/21/2022 at 09:00 AM in Internal Medicine (DANA Hollie Salk, NP)     Dx: Microcytic anemia   0 Result Notes    Component 3 wk ago  Alpha-Thalassemia Comment:  Comment: (NOTE) Test: Alpha-Thalassemia, DNA Analysis Result:     Negative, alpha alpha/alpha alpha      tains abnormal data Multiple Myeloma Panel (SPEP&IFE w/QIG) Order: [id number] Status: Edited Result - FINAL     Visible to patient: Yes (not seen)     Next appt: 10/21/2022 at 09:00 AM in Internal Medicine (DANA Hollie Salk, NP)     Dx: Microcytic anemia   0 Result Notes    Component Ref Range & Units 3 wk ago  IgG (Immunoglobin G), Serum 603 - 1,613 mg/dL 3,244  IgA 61 - 010 mg/dL 272  IgM (Immunoglobulin M), Srm 15 - 143 mg/dL 52  Total Protein ELP 6.0 - 8.5 g/dL 6.6 VC  Albumin SerPl Elph-Mcnc 2.9 - 4.4 g/dL 3.1 VC  Alpha 1 0.0 - 0.4 g/dL 0.3 VC  Alpha2 Glob SerPl Elph-Mcnc 0.4 - 1.0 g/dL 1.2 High  VC  B-Globulin SerPl Elph-Mcnc 0.7 - 1.3 g/dL 1.0 VC  Gamma Glob SerPl Elph-Mcnc 0.4 - 1.8 g/dL 1.1 VC  M Protein SerPl Elph-Mcnc Not Observed g/dL 0.5 High  VC  Globulin, Total 2.2 - 3.9 g/dL 3.5 VC  Albumin/Glob SerPl 0.7 - 1.7 0.9 VC  IFE 1 Comment Abnormal  VC  Comment: (NOTE) Immunofixation shows IgG monoclonal protein with lambda light chain specificity.      RADIOGRAPHIC STUDIES: I have personally reviewed the radiological images as  listed and agreed with the findings in the report. No results found.  ASSESSMENT & PLAN:   Wonderful 72 y.o. male of [place] descent with:  Microcytic Anemia with relative polycythemia --testing confirmed no significant iron deficiency.  Hemoglobin Electrophoresis shows results consistent with beta thalassemia minor.  Alpha thalassemia gene deletion testing negative.  Type 2 diabetes mellitus with stage 2 chronic kidney disease, without long-term current use of insulin  Essential hypertension Hyperlipidemia with Type 2 Diabetes Mellitus  Dyspeptic symptoms being evaluated by GI -- will need w/u ? PUD vs gastroparesis vs exocrine pancreatic inssufficiency vs medication  side effects  PLAN: -Discussed lab results from 06/27/2022 with the patient. CBC showed decreased hemoglobin at 10.9, decreased hematocrit at 36.9, and elevated platelets at 494 K. CMP showed elevated glucose level at 194, otherwise stable. Iron saturation in the normal range at 21%. Ferritin level in the normal range at 92. Hemoglobin A percent at 94.8% and Hemoglobin A2 percent at 5.2% . Multiple myeloma panel showed M-protein at 0.5. Vitamin B-12 level at 409. LDH in the normal range at 110.  -Patient's hemoglobin is at his baseline.  -Discussed that lab results shows the patient has beta-thalassemia minor, no iron deficiency, and no hemolysis.  -Discussed with the patient that multiple myeloma panel showed slightly elevated M-protein at 0.5. Discussed that this is most likely M-GUS.  -We will repeat lab to monitor M-protein level in 4-6 months    FOLLOW-UP: Phone visit with Dr Candise Che in 4 months Labs 1 week prior to phone visit   The total time spent in the appointment was 20 minutes* .  All of the patient's questions were answered with apparent satisfaction. The patient knows to call the clinic with any problems, questions or concerns.   Wyvonnia Lora MD MS AAHIVMS [facility] Hematology/Oncology Physician [facility]  .*Total Encounter Time as defined by the Centers for Medicare and Medicaid Services includes, in addition to the face-to-face time of a patient visit (documented in the note above) non-face-to-face time: obtaining and reviewing outside history, ordering and reviewing medications, tests or procedures, care coordination (communications with other health care professionals or caregivers) and documentation in the medical record.   I, Ok Edwards, am acting as a Neurosurgeon for Wyvonnia Lora, MD. .I have reviewed the above documentation for accuracy and completeness, and I agree with the above. Johney Maine MD

## 2022-07-17 ENCOUNTER — Telehealth: Payer: Self-pay | Admitting: Hematology

## 2022-07-17 LAB — ALPHA-THALASSEMIA GENOTYPR

## 2022-07-18 NOTE — Progress Notes (Signed)
MEDICARE WELLNESS AND FOLLOW UP Assessment:  Encounter for Medicare Annual Wellness Exam Due Yearly No labs today- had drawn 06/24/22  Essential hypertension - continue medications, DASH diet, exercise and monitor at home. Call if greater than 130/80.   Atherosclerosis of native coronary artery of native heart without angina pectoris Control blood pressure, cholesterol, glucose, increase exercise.   Stenosis of carotid artery, unspecified laterality Control blood pressure, cholesterol, glucose, increase exercise.  Followed by cardiology; moderate disease  Type 2 diabetes mellitus with other circulatory complication, without long-term current use of insulin (HCC) On metformin, glimepiride,  jardiance Could not tolerate Trulicity- abdominal pain and weight loss Discussed general issues about diabetes pathophysiology and management., Educational material distributed., Suggested low cholesterol diet., Encouraged aerobic exercise., Discussed foot care., Reminded to get yearly retinal exam.   Diabetic polyneuropathy associated with type 2 diabetes mellitus (HCC) Intermittent burning in bilateral feet, none recent, declines medications Control sugars, check feet daily   Type 2 diabetes mellitus with stage 2 chronic kidney disease, without long-term current use of insulin (HCC) Discussed general issues about diabetes pathophysiology and management., Educational material distributed., Suggested low cholesterol diet., Encouraged aerobic exercise., Discussed foot care., Reminded to get yearly retinal exam- report for this year in system  Weight loss unintentional Has gained 3 pounds back since stopping Trulicity  Type 2 diabetes mellitus with diabetic retinopathy(HCC) Control sugars, followed by Ophthalmology  Hyperlipidemia associated with Type 2 DM(HCC) -continue medications, check lipids, decrease fatty foods, increase activity.   Microcytic Anemia, beta thalassemia minor trait/Possible  M-GUS Continue to follow with hematology Dr. Candise Che  Meningioma(HCC) MRI 2022 showed unchanged  Medication management Continued  Drug induced constipation/ Lower abdominal pain/Weight loss unintentional Use stool softener daily, increase water and fiber intake, begin Miralax once a week Negative U/S of abdomen 10/2021 Has upcoming appointment with gastroenterology 07/24/22 with Dr. Ewing Schlein  Vitamin D deficiency Continue vitamin D supplement  Former smoker, 34 pack year history, quit 2007 -Last CXR 06/08/20- negative, healing rib fractures  Future Appointments  Date Time Provider Department Center  10/21/2022  9:00 AM Raynelle Dick, NP [facility] None  11/13/2022 11:30 AM [facility] None  11/20/2022  8:40 AM Johney Maine, MD [facility] None  01/21/2023  9:30 AM Lucky Cowboy, MD [facility] None  04/28/2023 11:00 AM Raynelle Dick, NP [facility] None    Plan:   During the course of the visit the patient was educated and counseled about appropriate screening and preventive services including:   Pneumococcal vaccine  Prevnar 13 Influenza vaccine Td vaccine Screening electrocardiogram Bone densitometry screening Colorectal cancer screening Diabetes screening Glaucoma screening Nutrition counseling  Advanced directives: requested   Subjective:  William Haney is a 72 y.o. male who presents for medicare wellness and 3 month follow up for HTN, hyperlipidemia, diabetes, and vitamin D Def.    He continues to have abdominal pain and weight loss He has been having some lower stomach pain, constipation.  He has been referred to gastroenterology- Has appt with Dr. Ewing Schlein on 07/24/22  He is being followed by Dr. Candise Che for microcytic anemia and monoclonal paraproteinemia and possible M-GUS Lab Results  Component Value Date   IRON 59 06/27/2022   TIBC 286 06/27/2022   FERRITIN 92 06/27/2022   Lab Results  Component Value Date   VITAMINB12 409 06/27/2022    Hematology note 07/16/22: -Discussed lab results from 06/27/2022 with the patient. CBC showed decreased hemoglobin at 10.9, decreased hematocrit at 36.9, and elevated platelets at 494  K. CMP showed elevated glucose level at 194, otherwise stable. Iron saturation in the normal range at 21%. Ferritin level in the normal range at 92. Hemoglobin A percent at 94.8% and Hemoglobin A2 percent at 5.2%. Multiple myeloma panel showed M-protein at 0.5. Vitamin B-12 level at 409. LDH in the normal range at 110.  -Patient's hemoglobin is at his baseline.  -Discussed that lab results shows the patient has beta-thalassemia minor, no iron deficiency, and no hemolysis.  -Discussed with the patient that multiple myeloma panel showed slightly elevated M-protein at 0.5. Discussed that this is most likely M-GUS.  -We will repeat lab to monitor M-protein level in 4-6 weeks.      BMI is Body mass index is 21.91 kg/m., he has been working on diet and exercise. Gets on stationary cycle 20-25 min daily.  Wt Readings from Last 3 Encounters:  07/19/22 112 lb 3.2 oz (50.9 kg)  06/27/22 109 lb 9.6 oz (49.7 kg)  06/24/22 110 lb 12.8 oz (50.3 kg)   He has a history of CAD s/p CABG in 2008, follows with Dr. Antoine Poche.   Also follows for moderate bilateral carotid stenosis getting annual carotid US He has a history of smoking, Q 2008, 35 pack year, last CXR 06/08/20- normal His blood pressure has been controlled at home (120/80), today their BP is BP: 106/60  BP Readings from Last 3 Encounters:  07/19/22 106/60  06/27/22 120/69  06/24/22 96/60  He does workout.  He denies chest pain, shortness of breath, dizziness.   He is on Rosuvastatin 20 mg QD, was recently decreased from 40 mg QD by cardiologist due to back/shoulder pain and good cholesterol control.  Lab Results  Component Value Date   CHOL 130 06/24/2022   HDL 37 (L) 06/24/2022   LDLCALC 71 06/24/2022   TRIG 140 06/24/2022   CHOLHDL 3.5 06/24/2022    He has  been working on diet and exercise for diabetes  with history of CAD/PAD ,he is on 81mg  ASA With hyperlipidemia on crestor 20 at goal less than 70 With CKD he is on micardis Retinopathy - 2022 he is on metformin 2000 mg daily GLIPIZIDE 5 MG bid WITH MEAls Cannot do GLP1's due to weight loss  Jardiance 25 mg daily His blood sugars have been running 160-170 Haas reported parasthesias in the past but none recently  Denies foot ulcerations, hyperglycemia, polydipsia, polyuria and visual disturbances.  Last A1C in the office was:  Lab Results  Component Value Date   HGBA1C 13.2 (H) 06/24/2022   Lab Results  Component Value Date   EGFR 75 06/24/2022    Patient is on Vitamin D supplement.   Lab Results  Component Value Date   VD25OH 139 (H) 09/18/2021      Medication Review:  Current Outpatient Medications (Endocrine & Metabolic):    empagliflozin (JARDIANCE) 25 MG TABS tablet, Take 1 tablet (25 mg total) by mouth daily.   glipiZIDE (GLUCOTROL) 5 MG tablet, Take twice a day with meals if blood sugar is over 120   metFORMIN (GLUCOPHAGE) 500 MG tablet, TAKE 2 TABLETS BY MOUTH TWICE A DAY WITH MEALS FOR DIABETES  Current Outpatient Medications (Cardiovascular):    rosuvastatin (CRESTOR) 20 MG tablet, Take 1 tablet (20 mg total) by mouth daily.   telmisartan (MICARDIS) 20 MG tablet, TAKE 1 TABLET BY MOUTH DAILY FOR BLOOD PRESSURE AND KIDNEY PROTECTION   Current Outpatient Medications (Analgesics):    aspirin EC 81 MG tablet, Take 81 mg by mouth  daily. Swallow whole.   Current Outpatient Medications (Other):    blood glucose meter kit and supplies, Test sugars once to twice daily   Cholecalciferol (VITAMIN D3) 125 MCG (5000 UT) CAPS, Take 5,000 Units by mouth daily.   co-enzyme Q-10 30 MG capsule, Take 30 mg by mouth 3 (three) times daily.   dicyclomine (BENTYL) 20 MG tablet, TAKE 1/2 TO 1 TABLET 4 X /DAY BEFORE MEALS & BEDTIME FOR NAUSEA, CRAMPING, BLOATING OR DIARRHEA    esomeprazole (NEXIUM) 40 MG capsule, Take 1 capsule (40 mg total) by mouth daily before breakfast.   Multiple Vitamin (MULTIVITAMIN) tablet, Take 1 tablet by mouth daily.  Current Problems (verified) Patient Active Problem List   Diagnosis Date Noted   Coronary artery disease involving native coronary artery of native heart without angina pectoris 08/14/2020   CKD stage 2 due to type 2 diabetes mellitus (HCC) 02/11/2020   Primary osteoarthritis of left knee 12/18/2018   beta thalassemia minor (trait) anemia 03/08/2018   Overweight (BMI 25.0-29.9) 08/10/2017   Poor compliance 09/09/2015   DM type 2 (diabetes mellitus, type 2) (HCC) 09/08/2015   Neuropathy, diabetic (HCC) 05/19/2015   Essential hypertension 10/11/2013   Vitamin D deficiency 10/11/2013   Medication management 10/11/2013   Hyperlipidemia associated with type 2 diabetes mellitus (HCC) 03/01/2010   Carotid stenosis 03/01/2010   Allergies Allergies  Allergen Reactions   Ace Inhibitors Cough   Statins Other (See Comments)    Elevated LFT's    SURGICAL HISTORY He  has a past surgical history that includes Coronary artery bypass graft (2008) and Total knee arthroplasty (Left, 12/18/2018). FAMILY HISTORY His family history includes Diabetes in his sister; Heart attack in his father; Heart disease in his brother and father; Hyperlipidemia in his father; Hypertension in his father. SOCIAL HISTORY He  reports that he quit smoking about 16 years ago. His smoking use included cigarettes. He started smoking about 51 years ago. He has a 34.00 pack-year smoking history. He has never used smokeless tobacco. He reports that he does not drink alcohol and does not use drugs.   MEDICARE WELLNESS OBJECTIVES: Physical activity: Current Exercise Habits: Home exercise routine, Type of exercise: walking, Time (Minutes): 20, Frequency (Times/Week): 4, Weekly Exercise (Minutes/Week): 80, Intensity: Mild, Exercise limited by: Other - see  comments (Abdominal pain) Cardiac risk factors: Cardiac Risk Factors include: advanced age (>24men, >54 women);dyslipidemia;diabetes mellitus;hypertension;male gender;smoking/ tobacco exposure Depression/mood screen:      07/19/2022   10:48 AM  Depression screen PHQ 2/9  Decreased Interest 0  Down, Depressed, Hopeless 0  PHQ - 2 Score 0    ADLs:     07/19/2022   10:48 AM 01/10/2022   11:43 AM  In your present state of health, do you have any difficulty performing the following activities:  Hearing? 0 0  Vision? 0 0  Difficulty concentrating or making decisions? 0 0  Walking or climbing stairs? 0 0  Dressing or bathing? 0 0  Doing errands, shopping? 0 0     Cognitive Testing  Alert? Yes  Normal Appearance?Yes  Oriented to person? Yes  Place? Yes   Time? Yes  Recall of three objects?  Yes  Can perform simple calculations? Yes  Displays appropriate judgment?Yes  Can read the correct time from a watch face?Yes  EOL planning: Does Patient Have a Medical Advance Directive?: No Does patient want to make changes to medical advance directive?: No - Patient declined     Screening Tests Immunization History  Administered Date(s) Administered   DT (Pediatric) 02/07/2015   Influenza Whole 12/10/2012   Influenza, High Dose Seasonal PF 02/07/2015, 01/02/2016, 12/13/2016, 01/01/2018, 11/19/2018, 02/21/2020   Influenza-Unspecified 12/31/2013   Meningococcal Conjugate 09/03/2016   PFIZER(Purple Top)SARS-COV-2 Vaccination 04/02/2019, 04/23/2019   Pneumococcal Conjugate-13 02/02/2014   Pneumococcal Polysaccharide-23 04/13/2003, 10/08/2016   Td 04/18/2004   Health Maintenance  Topic Date Due   Zoster Vaccines- Shingrix (1 of 2) Never done   COVID-19 Vaccine (3 - Pfizer risk series) 05/21/2019   FOOT EXAM  11/19/2019   INFLUENZA VACCINE  10/09/2020   HEMOGLOBIN A1C  04/12/2021   OPHTHALMOLOGY EXAM  05/10/2021   COLONOSCOPY (Pts 45-34yrs Insurance coverage will need to be confirmed)   08/22/2022   TETANUS/TDAP  02/06/2025   Pneumonia Vaccine 84+ Years old  Completed   Hepatitis C Screening  Completed   HPV VACCINES  Aged Out   Health Maintenance  Topic Date Due   Diabetic kidney evaluation - Urine ACR  06/06/2022   COVID-19 Vaccine (4 - 2023-24 season) 08/04/2022 (Originally 02/02/2022)   OPHTHALMOLOGY EXAM  09/18/2022 (Originally 06/17/2022)   Zoster Vaccines- Shingrix (1 of 2) 10/19/2022 (Originally 06/08/1969)   COLONOSCOPY (Pts 45-72yrs Insurance coverage will need to be confirmed)  08/22/2022   INFLUENZA VACCINE  10/10/2022   HEMOGLOBIN A1C  12/24/2022   Diabetic kidney evaluation - eGFR measurement  06/27/2023   FOOT EXAM  07/19/2023   Medicare Annual Wellness (AWV)  07/19/2023   DTaP/Tdap/Td (3 - Tdap) 02/06/2025   Pneumonia Vaccine 34+ Years old  Completed   Hepatitis C Screening  Completed   HPV VACCINES  Aged Out      Names of Other Physician/Practitioners you currently use: 1. [facility] here for primary care 2. Dr. Clydia Llano- his son 2023 3. Dr. Excell Seltzer, dentist, 2021- has full dentures  Patient Care Team: Lucky Cowboy, MD as PCP - General (Internal Medicine) Rollene Rotunda, MD as PCP - Cardiology (Cardiology) Marzella Schlein., MD as Consulting Physician (Ophthalmology) Rollene Rotunda, MD as Consulting Physician (Cardiology) Vida Rigger, MD as Consulting Physician (Gastroenterology) Jodi Geralds, MD as Consulting Physician (Orthopedic Surgery) .  Review of Systems  Constitutional:  Negative for chills, fever and weight loss.  HENT:  Negative for congestion and hearing loss.   Eyes:  Negative for blurred vision and double vision.  Respiratory:  Negative for cough and shortness of breath.   Cardiovascular:  Negative for chest pain, palpitations, orthopnea and leg swelling.  Gastrointestinal:  Positive for abdominal pain (improving). Negative for constipation, diarrhea, heartburn, nausea and vomiting.   Musculoskeletal:  Positive for joint pain (right knee). Negative for falls and myalgias.  Skin:  Negative for rash.  Neurological:  Negative for dizziness, tingling, tremors, loss of consciousness and headaches.  Psychiatric/Behavioral:  Negative for depression, memory loss and suicidal ideas.     Objective:   Blood pressure 106/60, pulse 84, temperature 97.9 F (36.6 C), height 5' (1.524 m), weight 112 lb 3.2 oz (50.9 kg), SpO2 98 %. Body mass index is 21.91 kg/m.  General appearance: alert, no distress, WD/WN, male HEENT: normocephalic, sclerae anicteric, TMs pearly, nares patent, no discharge or erythema, pharynx normal Oral cavity: MMM, no lesions Neck: supple, no lymphadenopathy, no thyromegaly, no masses Heart: RRR, normal S1, S2, no murmurs Lungs: CTA bilaterally, no wheezes, rhonchi, or rales Abdomen: +bs, soft, mild tenderness with deep palpation of right and left lower quadrant, no rebound tenderness Musculoskeletal: nontender, no swelling, varus deformity, antalgic gait, well  healing vertical left knee scar , + crepitus right knee.  Extremities: no edema, no cyanosis, no clubbing Pulses: 2+ symmetric, upper and lower extremities, normal cap refill Neurological: alert, oriented x 3, CN2-12 intact, strength normal upper extremities and lower extremities, sensation abnormal bilateral feet to monofilament, DTRs 2+ throughout, no cerebellar signs Psychiatric: normal affect, behavior normal, pleasant   Medicare Attestation I have personally reviewed: The patient's medical and social history Their use of alcohol, tobacco or illicit drugs Their current medications and supplements The patient's functional ability including ADLs,fall risks, home safety risks, cognitive, and hearing and visual impairment Diet and physical activities Evidence for depression or mood disorders  The patient's weight, height, BMI, and visual acuity have been recorded in the chart.  I have made  referrals, counseling, and provided education to the patient based on review of the above and I have provided the patient with a written personalized care plan for preventive services.    Revonda Humphrey ANP-C  [facility]  07/19/2022

## 2022-07-19 ENCOUNTER — Encounter: Payer: Self-pay | Admitting: Nurse Practitioner

## 2022-07-19 ENCOUNTER — Ambulatory Visit (INDEPENDENT_AMBULATORY_CARE_PROVIDER_SITE_OTHER): Payer: Medicare Other | Admitting: Nurse Practitioner

## 2022-07-19 VITALS — BP 106/60 | HR 84 | Temp 97.9°F | Ht 60.0 in | Wt 112.2 lb

## 2022-07-19 DIAGNOSIS — R1031 Right lower quadrant pain: Secondary | ICD-10-CM | POA: Diagnosis not present

## 2022-07-19 DIAGNOSIS — N182 Chronic kidney disease, stage 2 (mild): Secondary | ICD-10-CM | POA: Diagnosis not present

## 2022-07-19 DIAGNOSIS — D329 Benign neoplasm of meninges, unspecified: Secondary | ICD-10-CM | POA: Diagnosis not present

## 2022-07-19 DIAGNOSIS — Z0000 Encounter for general adult medical examination without abnormal findings: Secondary | ICD-10-CM

## 2022-07-19 DIAGNOSIS — Z79899 Other long term (current) drug therapy: Secondary | ICD-10-CM | POA: Diagnosis not present

## 2022-07-19 DIAGNOSIS — E559 Vitamin D deficiency, unspecified: Secondary | ICD-10-CM

## 2022-07-19 DIAGNOSIS — K581 Irritable bowel syndrome with constipation: Secondary | ICD-10-CM

## 2022-07-19 DIAGNOSIS — I6529 Occlusion and stenosis of unspecified carotid artery: Secondary | ICD-10-CM

## 2022-07-19 DIAGNOSIS — K5903 Drug induced constipation: Secondary | ICD-10-CM | POA: Diagnosis not present

## 2022-07-19 DIAGNOSIS — Z87891 Personal history of nicotine dependence: Secondary | ICD-10-CM

## 2022-07-19 DIAGNOSIS — E1142 Type 2 diabetes mellitus with diabetic polyneuropathy: Secondary | ICD-10-CM

## 2022-07-19 DIAGNOSIS — R634 Abnormal weight loss: Secondary | ICD-10-CM

## 2022-07-19 DIAGNOSIS — Z0001 Encounter for general adult medical examination with abnormal findings: Secondary | ICD-10-CM

## 2022-07-19 DIAGNOSIS — I2510 Atherosclerotic heart disease of native coronary artery without angina pectoris: Secondary | ICD-10-CM

## 2022-07-19 DIAGNOSIS — E1169 Type 2 diabetes mellitus with other specified complication: Secondary | ICD-10-CM

## 2022-07-19 DIAGNOSIS — R6889 Other general symptoms and signs: Secondary | ICD-10-CM | POA: Diagnosis not present

## 2022-07-19 DIAGNOSIS — E785 Hyperlipidemia, unspecified: Secondary | ICD-10-CM

## 2022-07-19 DIAGNOSIS — D509 Iron deficiency anemia, unspecified: Secondary | ICD-10-CM | POA: Diagnosis not present

## 2022-07-19 DIAGNOSIS — E1122 Type 2 diabetes mellitus with diabetic chronic kidney disease: Secondary | ICD-10-CM | POA: Diagnosis not present

## 2022-07-19 NOTE — Patient Instructions (Signed)

## 2022-07-20 ENCOUNTER — Other Ambulatory Visit: Payer: Self-pay | Admitting: Hematology

## 2022-07-20 LAB — URINALYSIS, ROUTINE W REFLEX MICROSCOPIC
Bilirubin Urine: NEGATIVE
Hgb urine dipstick: NEGATIVE
Ketones, ur: NEGATIVE
Leukocytes,Ua: NEGATIVE
Nitrite: NEGATIVE
Protein, ur: NEGATIVE
Specific Gravity, Urine: 1.031 (ref 1.001–1.035)
pH: 7 (ref 5.0–8.0)

## 2022-07-20 LAB — MICROALBUMIN / CREATININE URINE RATIO
Creatinine, Urine: 52 mg/dL (ref 20–320)
Microalb Creat Ratio: 15 mg/g creat (ref <30)
Microalb, Ur: 0.8 mg/dL

## 2022-07-24 ENCOUNTER — Other Ambulatory Visit: Payer: Self-pay | Admitting: Gastroenterology

## 2022-07-24 DIAGNOSIS — R634 Abnormal weight loss: Secondary | ICD-10-CM | POA: Diagnosis not present

## 2022-07-24 DIAGNOSIS — R109 Unspecified abdominal pain: Secondary | ICD-10-CM | POA: Diagnosis not present

## 2022-07-24 DIAGNOSIS — K5901 Slow transit constipation: Secondary | ICD-10-CM | POA: Diagnosis not present

## 2022-07-30 ENCOUNTER — Ambulatory Visit
Admission: RE | Admit: 2022-07-30 | Discharge: 2022-07-30 | Disposition: A | Discharge status: Home / Self Care | Payer: Medicare Other | Source: Ambulatory Visit | Attending: Gastroenterology | Admitting: Gastroenterology

## 2022-07-30 DIAGNOSIS — R634 Abnormal weight loss: Secondary | ICD-10-CM

## 2022-07-30 DIAGNOSIS — R109 Unspecified abdominal pain: Secondary | ICD-10-CM

## 2022-07-30 MED ORDER — IOPAMIDOL (ISOVUE-300) INJECTION 61%
100.0000 mL | Freq: Once | INTRAVENOUS | Status: AC | PRN
  Administered 2022-07-30: 100 mL via INTRAVENOUS

## 2022-09-07 ENCOUNTER — Other Ambulatory Visit: Payer: Self-pay | Admitting: Nurse Practitioner

## 2022-09-07 DIAGNOSIS — K581 Irritable bowel syndrome with constipation: Secondary | ICD-10-CM

## 2022-09-24 ENCOUNTER — Ambulatory Visit: Payer: Medicare Other | Admitting: Nurse Practitioner

## 2022-10-17 NOTE — Progress Notes (Signed)
COMPLETE PHYSICAL  Assessment:   Encounter for routine medical examination with abnormal findings Yearly  Essential hypertension Continue current medications:telmisartan 20mg  daily Monitor blood pressure at home; call if consistently over 130/80 Continue DASH diet.   Reminder to go to the ER if any CP, SOB, nausea, dizziness, severe HA, changes vision/speech, left arm numbness and tingling and jaw pain. -     CBC with Differential/Platelet -     CMP/GFR -     TSH  Aortic Atherosclerosis(HCC)/ Coronary Artery Disease  Control blood pressure, cholesterol, glucose, increase exercise.  -     Lipid panel -     Hemoglobin A1c   Emphysema per CT 07/2022(HCC) Continue to monitor for symptoms  Currently on no medications  Stenosis of carotid artery, unspecified laterality Control blood pressure, cholesterol, glucose, increase exercise.  Followed by cardiology; moderate disease -     Lipid panel -     Hemoglobin A1c  LBBB New on EKG today Continue current medications and referred for follow up with Dr. Antoine Poche- was not seen last year.   Diabetic polyneuropathy associated with type 2 diabetes mellitus (HCC) Intermittent burning in bilateral feet, none recent, declines medications Control sugars, check feet daily -     Hemoglobin A1c  Type 2 diabetes mellitus with stage 2 chronic kidney disease, without long-term current use of insulin (HCC) Continue medications: Metformin 500mg  2 tab BID with meals., Jardiance 25mg  daily and glipizide 5 mg BID if blood sugar is greater than 120. Prescribed Trulicity 1.5 mg SQ QW-not taking due to weight loss Discussed general issues about diabetes pathophysiology and management., Educational material distributed., Suggested low cholesterol diet., Encouraged aerobic exercise., Discussed foot care., Reminded to get yearly retinal exam- report for this year in system -     Hemoglobin A1c  Type 2 diabetes mellitus with diabetic retinopathy Control  sugars, followed by Ophthalmology  Hyperlipidemia with Type 2 Daibetes Mellitus(HCC) Continue medications: Rosuvastatin 20mg  daily Discussed dietary and exercise modifications Low fat diet -     Lipid panel  Anemia, beta thalassemia minor trait -     CBC with Differential/Platelet  Medication management -     Magnesium  Vitamin D deficiency Continue vitamin D supplement 5,000IU daily Defer lab today  Former smoker, 34 pack year history, quit 2007 -CXR 06/08/20- no abnormality   BMI 23.0-23.9 Contact office with any new or worsening symptoms  Screening for hematuria or proteinuria - Routine UA with reflex microscopic - Microalbumin/creatinine urine ratio  Screening for ischemic heart disease - EKG  Screening for prostate cancer - PSA  Screening for AAA - U/S Retroperitoneal ABD LTD   Future Appointments  Date Time Provider Department Center  11/13/2022 11:30 AM [facility] None  11/20/2022  8:40 AM Johney Maine, MD [facility] None  01/21/2023  9:30 AM Lucky Cowboy, MD [facility] None  04/28/2023 11:00 AM Raynelle Dick, NP [facility] None  10/21/2023  9:00 AM Raynelle Dick, NP [facility] None    Further disposition pending results if labs check today. Discussed med's effects and SE's.   Over 30 minutes of face to face interview, exam, counseling, chart review, and critical decision making was performed.     Subjective:  William Haney is a 72 y.o. male who presents for CPE and 3 month follow up for HTN, hyperlipidemia, diabetes, and vitamin D Def.    He is having tingling in right 4th and 5th fingers which has been present for several years.  He has intermittent  tingling in toes.   He has been noting increased fatigue- continues to exercise but feels more fatigued afterwards.   S/p left knee replacement with Dr. Luiz Blare, now have right knee pain but is doing exercises, not currently interested in surgery. Daughter from [place]  moving back to [place], youngest daughter lives in [place] and  son lives in [place] area.   Had CT 07/30/22- showed emphysema and aortic atherosclerosis. He is being treated by Dr. Ewing Schlein for slow transit constipation. His stomach is a lot better but not hungry- trying to eat healthy. He is on Bentyl before each meal.  1.  No acute process in the abdomen or pelvis.  2.  Possible constipation.  3. Left nephrolithiasis  4. Aortic atherosclerosis (ICD10-I70.0) and emphysema (ICD10-J43.9).   BMI is Body mass index is 23.51 kg/m., he has been working on diet and exercise. Gets on stationary cycle 20-25 min daily.  Wt Readings from Last 3 Encounters:  10/21/22 120 lb 6.4 oz (54.6 kg)  07/19/22 112 lb 3.2 oz (50.9 kg)  06/27/22 109 lb 9.6 oz (49.7 kg)   He has a history of CAD s/p CABG in 2008, follows with Dr. Antoine Poche. Lab Results  Component Value Date   IRON 59 06/27/2022   TIBC 286 06/27/2022   FERRITIN 92 06/27/2022   Lab Results  Component Value Date   VITAMINB12 409 06/27/2022    Also follows for moderate bilateral carotid stenosis getting annual carotid US. Last U/S 05/21/22- Right 40-59% and left 1-39%. Follows with Dr. Allyson Sabal.  He has a history of smoking, Q 2008, 35 pack year, last CXR 04/2020 before surgery.  His blood pressure has been controlled at home (120/80), today their BP is BP: 94/60 BP Readings from Last 3 Encounters:  10/21/22 94/60  07/19/22 106/60  06/27/22 120/69  He does workout.  He denies chest pain, shortness of breath, dizziness.   He has been working on diet and exercise for diabetes  with history of CAD/PAD ,he is on 81mg  ASA With hyperlipidemia on crestor 40 at goal less than 70 With CKD he is on micardis Retinopathy - 06/2021, his son is an eye doctor he is on metformin 2000 mg daily and Glipizide 5 mg BID with meals if blood sugar is greater than 120 Jardiance 10 mg daily Trulicity 1.5 mg SQ QW was stopped due to weight loss Blood sugars fasting  150's, does not check regularly Has reported parasthesias in the past but none recently  Denies foot ulcerations, hyperglycemia, polydipsia, polyuria and visual disturbances.  Last A1C in the office was:  Lab Results  Component Value Date   HGBA1C 13.2 (H) 06/24/2022   Lab Results  Component Value Date   EGFR 75 06/24/2022    Patient is not currently on Vit D.  Lab Results  Component Value Date   VD25OH 139 (H) 09/18/2021      He is taking 5000 units of B12 daily Lab Results  Component Value Date   VITAMINB12 409 06/27/2022     Medication Review:  Current Outpatient Medications (Endocrine & Metabolic):    empagliflozin (JARDIANCE) 25 MG TABS tablet, Take 1 tablet (25 mg total) by mouth daily.   glipiZIDE (GLUCOTROL) 5 MG tablet, Take twice a day with meals if blood sugar is over 120   metFORMIN (GLUCOPHAGE) 500 MG tablet, TAKE 2 TABLETS BY MOUTH TWICE A DAY WITH MEALS FOR DIABETES  Current Outpatient Medications (Cardiovascular):    metoprolol tartrate (LOPRESSOR) 25 MG tablet, SMARTSIG:1 Tablet(s)  By Mouth   rosuvastatin (CRESTOR) 20 MG tablet, Take 1 tablet (20 mg total) by mouth daily.   telmisartan (MICARDIS) 20 MG tablet, TAKE 1 TABLET BY MOUTH DAILY FOR BLOOD PRESSURE AND KIDNEY PROTECTION   Current Outpatient Medications (Analgesics):    aspirin EC 81 MG tablet, Take 81 mg by mouth daily. Swallow whole.  Current Outpatient Medications (Hematological):    Cyanocobalamin (VITAMIN B-12 PO), Take by mouth.  Current Outpatient Medications (Other):    blood glucose meter kit and supplies, Test sugars once to twice daily   co-enzyme Q-10 30 MG capsule, Take 30 mg by mouth 3 (three) times daily.   dicyclomine (BENTYL) 20 MG tablet, TAKE 1/2 TO 1 TABLET 4 TIMES A DAY BEFORE MEALS & BEDTIME FOR NAUSEA, CRAMPING, BLOATING OR DIARRHEA   Multiple Vitamin (MULTIVITAMIN) tablet, Take 1 tablet by mouth daily.   Cholecalciferol (VITAMIN D3) 125 MCG (5000 UT) CAPS, Take 5,000  Units by mouth daily. (Patient not taking: Reported on 10/21/2022)   esomeprazole (NEXIUM) 40 MG capsule, TAKE 1 CAPSULE BY MOUTH EVERY DAY BEFORE BREAKFAST (Patient not taking: Reported on 10/21/2022)  Current Problems (verified) Patient Active Problem List   Diagnosis Date Noted   Coronary artery disease involving native coronary artery of native heart without angina pectoris 08/14/2020   CKD stage 2 due to type 2 diabetes mellitus (HCC) 02/11/2020   Primary osteoarthritis of left knee 12/18/2018   beta thalassemia minor (trait) anemia 03/08/2018   Overweight (BMI 25.0-29.9) 08/10/2017   Poor compliance 09/09/2015   DM type 2 (diabetes mellitus, type 2) (HCC) 09/08/2015   Neuropathy, diabetic (HCC) 05/19/2015   Essential hypertension 10/11/2013   Vitamin D deficiency 10/11/2013   Medication management 10/11/2013   Hyperlipidemia associated with type 2 diabetes mellitus (HCC) 03/01/2010   Carotid stenosis 03/01/2010   Allergies Allergies  Allergen Reactions   Ace Inhibitors Cough   Statins Other (See Comments)    Elevated LFT's      Screening Tests Immunization History  Administered Date(s) Administered   DT (Pediatric) 02/07/2015   Influenza Whole 12/10/2012   Influenza, High Dose Seasonal PF 02/07/2015, 01/02/2016, 12/13/2016, 01/01/2018, 11/19/2018, 02/21/2020, 01/10/2021, 01/10/2022   Influenza-Unspecified 12/31/2013   Meningococcal Conjugate 09/03/2016   PFIZER(Purple Top)SARS-COV-2 Vaccination 04/02/2019, 04/23/2019   Pfizer Covid-19 Vaccine Bivalent Booster 40yrs & up 10/22/2021   Pneumococcal Conjugate-13 02/02/2014   Pneumococcal Polysaccharide-23 04/13/2003, 10/08/2016   Td 04/18/2004   Unspecified SARS-COV-2 Vaccination 12/08/2021    Health Maintenance  Topic Date Due   Zoster Vaccines- Shingrix (1 of 2) Never done   COVID-19 Vaccine (4 - 2023-24 season) 02/02/2022   OPHTHALMOLOGY EXAM  06/17/2022   Colonoscopy  08/22/2022   INFLUENZA VACCINE  10/10/2022    HEMOGLOBIN A1C  12/24/2022   Diabetic kidney evaluation - eGFR measurement  06/27/2023   Diabetic kidney evaluation - Urine ACR  07/19/2023   FOOT EXAM  07/19/2023   Medicare Annual Wellness (AWV)  07/19/2023   DTaP/Tdap/Td (3 - Tdap) 02/06/2025   Pneumonia Vaccine 35+ Years old  Completed   Hepatitis C Screening  Completed   HPV VACCINES  Aged Out      Names of Other Physician/Practitioners you currently use: 1. [facility] here for primary care 2. [facility], Dr. Lucretia Roers, eye doctor, last visit 06/2021 - overdue 3. Dr. Excell Seltzer, dentist, last visit several years ago - has full dentures  Patient Care Team: Lucky Cowboy, MD as PCP - General (Internal Medicine) Rollene Rotunda, MD  as PCP - Cardiology (Cardiology) Marzella Schlein., MD as Consulting Physician (Ophthalmology) Rollene Rotunda, MD as Consulting Physician (Cardiology) Vida Rigger, MD as Consulting Physician (Gastroenterology) Jodi Geralds, MD as Consulting Physician (Orthopedic Surgery)   SURGICAL HISTORY He  has a past surgical history that includes Coronary artery bypass graft (2008) and Total knee arthroplasty (Left, 12/18/2018). FAMILY HISTORY His family history includes Diabetes in his sister; Heart attack in his father; Heart disease in his brother and father; Hyperlipidemia in his father; Hypertension in his father. SOCIAL HISTORY He  reports that he quit smoking about 17 years ago. His smoking use included cigarettes. He started smoking about 51 years ago. He has a 34.5 pack-year smoking history. He has never used smokeless tobacco. He reports that he does not drink alcohol and does not use drugs.  Review of Systems  Constitutional:  Positive for malaise/fatigue. Negative for chills and fever.  HENT:  Negative for congestion, hearing loss, sinus pain, sore throat and tinnitus.   Eyes:  Negative for blurred vision and double vision.  Respiratory:  Negative for cough,  hemoptysis, sputum production, shortness of breath and wheezing.   Cardiovascular:  Negative for chest pain, palpitations and leg swelling.  Gastrointestinal:  Positive for constipation. Negative for abdominal pain, diarrhea, heartburn, nausea and vomiting.  Genitourinary:  Negative for dysuria and urgency.  Musculoskeletal:  Positive for joint pain (occ right knee). Negative for back pain, falls, myalgias and neck pain.  Skin:  Negative for rash.  Neurological:  Positive for tingling (fingers and feet). Negative for dizziness, tremors, weakness and headaches.  Endo/Heme/Allergies:  Does not bruise/bleed easily.  Psychiatric/Behavioral:  Negative for depression and suicidal ideas. The patient is not nervous/anxious and does not have insomnia.      Objective:   Blood pressure 94/60, pulse 76, temperature 97.7 F (36.5 C), height 5' (1.524 m), weight 120 lb 6.4 oz (54.6 kg), SpO2 99%. Body mass index is 23.51 kg/m.  General appearance:very pleasant thin male,  alert, no distress, WD/WN HEENT: normocephalic, sclerae anicteric, TMs pearly, nares patent, no discharge or erythema, pharynx normal Oral cavity: MMM, no lesions Neck: supple, no lymphadenopathy, no thyromegaly, no masses Heart: Regularly Irregular, normal S1, S2, no murmurs Lungs: CTA bilaterally, no wheezes, rhonchi, or rales Abdomen: +bs, soft, non tender, non distended, no masses, no hepatomegaly, no splenomegaly Musculoskeletal: nontender, no swelling, varus deformity, antalgic gait, well healing vertical left knee scar , + crepitus right knee.  Extremities: no edema, no cyanosis, no clubbing Pulses: 2+ symmetric, upper and lower extremities, normal cap refill Neurological: alert, oriented x 3, CN2-12 intact, strength normal upper extremities and lower extremities, sensation abnormal bilateral feet, DTRs 2+ throughout, no cerebellar signs Psychiatric: normal affect, behavior normal, pleasant  EKG: NSR, new LBBB, Unifocal  PVC's, no ST changes.  Revonda Humphrey ANP-C  [facility]  10/21/2022

## 2022-10-21 ENCOUNTER — Encounter: Payer: Self-pay | Admitting: Nurse Practitioner

## 2022-10-21 ENCOUNTER — Ambulatory Visit (INDEPENDENT_AMBULATORY_CARE_PROVIDER_SITE_OTHER): Payer: Medicare Other | Admitting: Nurse Practitioner

## 2022-10-21 VITALS — BP 94/60 | HR 76 | Temp 97.7°F | Ht 60.0 in | Wt 120.4 lb

## 2022-10-21 DIAGNOSIS — E559 Vitamin D deficiency, unspecified: Secondary | ICD-10-CM

## 2022-10-21 DIAGNOSIS — I6523 Occlusion and stenosis of bilateral carotid arteries: Secondary | ICD-10-CM

## 2022-10-21 DIAGNOSIS — I700 Atherosclerosis of aorta: Secondary | ICD-10-CM

## 2022-10-21 DIAGNOSIS — I2510 Atherosclerotic heart disease of native coronary artery without angina pectoris: Secondary | ICD-10-CM

## 2022-10-21 DIAGNOSIS — N182 Chronic kidney disease, stage 2 (mild): Secondary | ICD-10-CM | POA: Diagnosis not present

## 2022-10-21 DIAGNOSIS — Z1329 Encounter for screening for other suspected endocrine disorder: Secondary | ICD-10-CM

## 2022-10-21 DIAGNOSIS — Z87891 Personal history of nicotine dependence: Secondary | ICD-10-CM

## 2022-10-21 DIAGNOSIS — I10 Essential (primary) hypertension: Secondary | ICD-10-CM

## 2022-10-21 DIAGNOSIS — E113293 Type 2 diabetes mellitus with mild nonproliferative diabetic retinopathy without macular edema, bilateral: Secondary | ICD-10-CM

## 2022-10-21 DIAGNOSIS — Z79899 Other long term (current) drug therapy: Secondary | ICD-10-CM

## 2022-10-21 DIAGNOSIS — E1122 Type 2 diabetes mellitus with diabetic chronic kidney disease: Secondary | ICD-10-CM | POA: Diagnosis not present

## 2022-10-21 DIAGNOSIS — D509 Iron deficiency anemia, unspecified: Secondary | ICD-10-CM

## 2022-10-21 DIAGNOSIS — E538 Deficiency of other specified B group vitamins: Secondary | ICD-10-CM

## 2022-10-21 DIAGNOSIS — Z136 Encounter for screening for cardiovascular disorders: Secondary | ICD-10-CM | POA: Diagnosis not present

## 2022-10-21 DIAGNOSIS — Z0001 Encounter for general adult medical examination with abnormal findings: Secondary | ICD-10-CM

## 2022-10-21 DIAGNOSIS — I447 Left bundle-branch block, unspecified: Secondary | ICD-10-CM

## 2022-10-21 DIAGNOSIS — Z0000 Encounter for general adult medical examination without abnormal findings: Secondary | ICD-10-CM

## 2022-10-21 DIAGNOSIS — J432 Centrilobular emphysema: Secondary | ICD-10-CM

## 2022-10-21 DIAGNOSIS — D329 Benign neoplasm of meninges, unspecified: Secondary | ICD-10-CM

## 2022-10-21 DIAGNOSIS — Z1389 Encounter for screening for other disorder: Secondary | ICD-10-CM

## 2022-10-21 DIAGNOSIS — Z125 Encounter for screening for malignant neoplasm of prostate: Secondary | ICD-10-CM

## 2022-10-21 DIAGNOSIS — E1142 Type 2 diabetes mellitus with diabetic polyneuropathy: Secondary | ICD-10-CM

## 2022-10-21 DIAGNOSIS — E1169 Type 2 diabetes mellitus with other specified complication: Secondary | ICD-10-CM | POA: Diagnosis not present

## 2022-10-21 DIAGNOSIS — Z6823 Body mass index (BMI) 23.0-23.9, adult: Secondary | ICD-10-CM

## 2022-10-21 LAB — CBC WITH DIFFERENTIAL/PLATELET
Absolute Monocytes: 726 cells/uL (ref 200–950)
Basophils Absolute: 61 cells/uL (ref 0–200)
Basophils Relative: 1 %
Eosinophils Absolute: 317 cells/uL (ref 15–500)
Eosinophils Relative: 5.2 %
HCT: 41.2 % (ref 38.5–50.0)
Hemoglobin: 11.8 g/dL — ABNORMAL LOW (ref 13.2–17.1)
Lymphs Abs: 1562 cells/uL (ref 850–3900)
MCH: 19.8 pg — ABNORMAL LOW (ref 27.0–33.0)
MCHC: 28.6 g/dL — ABNORMAL LOW (ref 32.0–36.0)
MCV: 69.1 fL — ABNORMAL LOW (ref 80.0–100.0)
Monocytes Relative: 11.9 %
Neutro Abs: 3434 cells/uL (ref 1500–7800)
Neutrophils Relative %: 56.3 %
Platelets: 321 10*3/uL (ref 140–400)
RBC: 5.96 10*6/uL — ABNORMAL HIGH (ref 4.20–5.80)
RDW: 15.7 % — ABNORMAL HIGH (ref 11.0–15.0)
Total Lymphocyte: 25.6 %
WBC: 6.1 10*3/uL (ref 3.8–10.8)

## 2022-10-21 NOTE — Patient Instructions (Signed)
Left Bundle Branch Block  Left bundle branch block (LBBB) is a problem with the way that electrical impulses pass through the heart (electrical conduction abnormality). The heart needs an electrical signal to beat like it should. The electrical signal for a heartbeat starts in the upper chambers of the heart (atria) and then travels to the two lower chambers (left and rightventricles). An LBBB is a block of the pathway that carries the signal to the left ventricle. If you have LBBB, the left side of your heart does not beat like normal. LBBB may be a warning sign of heart disease. What are the causes? LBBB may be caused by: Heart disease. Arteriosclerosis. This is a disease of the arteries in the heart. Cardiomyopathy. This is when the heart muscle stiffens or becomes weak. Myocarditis. This is an infection of the heart muscle. High blood pressure. In some cases, the cause may not be known. What increases the risk? You are more likely to get LBBB if: You are male. You are 64 years of age or older. You have heart disease. You have had a heart attack or heart surgery. What are the signs or symptoms? LBBB may not cause any symptoms. If you do have symptoms, they may include: Feeling dizzy or light-headed. Fainting. How is this diagnosed? LBBB may be diagnosed based on an electrocardiogram (ECG). It is often found when an ECG is done as part of a physical exam or to help find the cause of fainting spells. You may also have imaging tests done. These may include: Chest X-rays. Echocardiogram. How is this treated? You may not need treatment if you do not have symptoms or any other heart problems. But you may need to see your health care provider more often to make sure you do not get other heart problems. You may also need treatment for high blood pressure. If LBBB causes symptoms or other heart problems, you may need to have an electrical device (pacemaker) placed under the skin of your  chest. A pacemaker sends signals to your heart to keep it beating like it should. Follow these instructions at home: Lifestyle  Ask your provider about what you may eat and drink. Eat foods that are good for your heart and stay at a healthy weight. Work with a diet and Hospital doctor (dietitian) to make the best eating plan for you. Activity Get regular exercise as told by your provider. Return to your normal activities as told by your provider. Ask your provider what activities are safe for you. General instructions Take over-the-counter and prescription medicines only as told by your provider. Do not use any products that contain nicotine or tobacco. These products include cigarettes, chewing tobacco, and vaping devices, such as e-cigarettes. If you need help quitting, ask your provider. Contact a health care provider if: You feel light-headed. You faint. Get help right away if: You have chest pain. You have trouble breathing. These symptoms may be an emergency. Get help right away. Call 911. Do not wait to see if the symptoms will go away. Do not drive yourself to the hospital. This information is not intended to replace advice given to you by your health care provider. Make sure you discuss any questions you have with your health care provider. Document Revised: 11/27/2021 Document Reviewed: 11/27/2021 Elsevier Patient Education  2024 [employer].

## 2022-10-24 ENCOUNTER — Other Ambulatory Visit: Payer: Self-pay | Admitting: Hematology

## 2022-11-07 NOTE — Progress Notes (Signed)
Cardiology Clinic Note   Date: 11/13/2022 ID: William Haney, DOB [birth year], MRN [id number]  Primary Cardiologist:  Rollene Rotunda, MD  Patient Profile    William Haney is a 72 y.o. male who presents to the clinic today for routine follow up.     Past medical history significant for: CAD. LHC 09/01/2006 (positive stress test): Severe two-vessel CAD.  Proximal LAD 70%.  Mid LAD 80%.  LAD distal to D2 90%.  D1 95%.  D2 80%.  Ostial LCx 30%.  Proximal RCA 90%.  EF approximately 45% with hypokinesis of the mid anterior wall and akinesis of the posterior basal wall and mid inferior wall.  CTS consult. CABG x 4 09/03/2006: LIMA to LAD, SVG to D1, SVG to OM, SVG to RPL. Ischemic cardiomyopathy. Echo 06/08/2020: EF 45 to 50%.  Hypokinesis of LV, entire inferior septal wall, inferior wall and inferior lateral wall.  Normal RV function.  Mildly elevated PA pressure.  Mild LAE.  Trivial MR.  Mild to moderate AI. Carotid stenosis. Carotid duplex 05/21/2022: Right ICA 40 to 59%.  Left ICA 1 to 39%.  Bilateral subclavian artery flow was disturbed. Hypertension. Hyperlipidemia. Lipid panel 10/21/2022: LDL 59, HDL 39, TG 107, total 117. T2DM. CKD stage II.     History of Present Illness    William Haney is a longtime patient of cardiology.  He is followed by Dr. Antoine Poche for the above outlined history.  Patient was last seen in the office by Dr. Antoine Poche on 12/18/2021 for routine follow-up.  He was doing well at that time and no changes were made.  Today, patient is doing well. Patient denies shortness of breath or dyspnea on exertion. No chest pain, pressure, or tightness. Denies lower extremity edema, orthopnea, or PND. No palpitations.  He stays active riding a stationary bicycle every other day, taking walks with his wife and doing yard work. He has occasional back pain that improved with decreased dose of rosuvastatin.     ROS: All other systems reviewed and are otherwise negative except as noted in  History of Present Illness.  Studies Reviewed      EKG is not ordered today.          Physical Exam    VS:  BP 138/70   Pulse 85   Ht 5\' 1"  (1.549 m)   Wt 123 lb (55.8 kg)   SpO2 99%   BMI 23.24 kg/m  , BMI Body mass index is 23.24 kg/m.  GEN: Well nourished, well developed, in no acute distress. Neck: No JVD or carotid bruits. Cardiac:  RRR. No murmurs. No rubs or gallops.   Respiratory:  Respirations regular and unlabored. Clear to auscultation without rales, wheezing or rhonchi. GI: Soft, nontender, nondistended. Extremities: Radials/DP/PT 2+ and equal bilaterally. No clubbing or cyanosis. No edema.  Skin: Warm and dry, no rash. Neuro: Strength intact.  Assessment & Plan    CAD.  S/p CABG x 13 August 2006. Patient denies chest pain, tightness, pressure. Continue aspirin, metoprolol, Crestor. Ischemic cardiomyopathy.  Echo March 2022 showed EF 45 to 50%, mildly elevated PA pressure, mild LAE, mild to moderate AI.  Patient denies lower extremity edema, DOE, orthopnea or PND. Euvolemic and well compensated on exam.  Continue metoprolol, telmisartan, Jardiance. Hypertension: BP today 138/70. Patient denies headaches, dizziness or vision changes. Continue metoprolol, telmisartan. Hyperlipidemia.  LDL August 2024 59, at goal.  Continue rosuvastatin.  Disposition: Return in 1 year or sooner as needed.  Signed, Etta Grandchild. Reign Bartnick, DNP, NP-C

## 2022-11-13 ENCOUNTER — Other Ambulatory Visit: Payer: Self-pay

## 2022-11-13 ENCOUNTER — Encounter: Payer: Self-pay | Admitting: Student

## 2022-11-13 ENCOUNTER — Ambulatory Visit: Payer: Medicare Other | Attending: Student | Admitting: Student

## 2022-11-13 ENCOUNTER — Inpatient Hospital Stay: Payer: Medicare Other | Attending: Hematology

## 2022-11-13 VITALS — BP 138/70 | HR 85 | Ht 61.0 in | Wt 123.0 lb

## 2022-11-13 DIAGNOSIS — I10 Essential (primary) hypertension: Secondary | ICD-10-CM | POA: Diagnosis not present

## 2022-11-13 DIAGNOSIS — E785 Hyperlipidemia, unspecified: Secondary | ICD-10-CM

## 2022-11-13 DIAGNOSIS — I6523 Occlusion and stenosis of bilateral carotid arteries: Secondary | ICD-10-CM

## 2022-11-13 DIAGNOSIS — D472 Monoclonal gammopathy: Secondary | ICD-10-CM

## 2022-11-13 DIAGNOSIS — I255 Ischemic cardiomyopathy: Secondary | ICD-10-CM

## 2022-11-13 DIAGNOSIS — I2510 Atherosclerotic heart disease of native coronary artery without angina pectoris: Secondary | ICD-10-CM

## 2022-11-13 NOTE — Patient Instructions (Signed)
Medication Instructions:  No medication changes *If you need a refill on your cardiac medications before your next appointment, please call your pharmacy*   Lab Work: No labs ordered If you have labs (blood work) drawn today and your tests are completely normal, you will receive your results only by: MyChart Message (if you have MyChart) OR A paper copy in the mail If you have any lab test that is abnormal or we need to change your treatment, we will call you to review the results.   Testing/Procedures: Your physician has requested that you have a carotid duplex. This test is an ultrasound of the carotid arteries in your neck. It looks at blood flow through these arteries that supply the brain with blood. Allow one hour for this exam. There are no restrictions or special instructions.    Follow-Up: At [facility], you and your health needs are our priority.  As part of our continuing mission to provide you with exceptional heart care, we have created designated Provider Care Teams.  These Care Teams include your primary Cardiologist (physician) and Advanced Practice Providers (APPs -  Physician Assistants and Nurse Practitioners) who all work together to provide you with the care you need, when you need it.  We recommend signing up for the patient portal called "MyChart".  Sign up information is provided on this After Visit Summary.  MyChart is used to connect with patients for Virtual Visits (Telemedicine).  Patients are able to view lab/test results, encounter notes, upcoming appointments, etc.  Non-urgent messages can be sent to your provider as well.   To learn more about what you can do with MyChart, go to [email or web address].    Your next appointment:   1 year(s)  Provider:   Rollene Rotunda, MD  a letter will be mailed to you as a reminder to call the office for your 1 year follow up.

## 2022-11-20 ENCOUNTER — Inpatient Hospital Stay: Payer: Medicare Other | Admitting: Hematology

## 2022-11-20 NOTE — Progress Notes (Signed)
This encounter was created in error - please disregard.

## 2022-11-26 ENCOUNTER — Ambulatory Visit (HOSPITAL_COMMUNITY)
Admission: RE | Admit: 2022-11-26 | Payer: Medicare Other | Source: Ambulatory Visit | Attending: Student | Admitting: Student

## 2022-12-03 ENCOUNTER — Other Ambulatory Visit: Payer: Self-pay | Admitting: Nurse Practitioner

## 2022-12-07 ENCOUNTER — Telehealth: Payer: Self-pay | Admitting: Hematology

## 2022-12-19 ENCOUNTER — Other Ambulatory Visit: Payer: Self-pay | Admitting: Cardiology

## 2022-12-19 DIAGNOSIS — E785 Hyperlipidemia, unspecified: Secondary | ICD-10-CM

## 2022-12-23 ENCOUNTER — Inpatient Hospital Stay: Payer: Medicare Other | Attending: Hematology

## 2022-12-23 DIAGNOSIS — D472 Monoclonal gammopathy: Secondary | ICD-10-CM

## 2022-12-23 DIAGNOSIS — Z87891 Personal history of nicotine dependence: Secondary | ICD-10-CM | POA: Diagnosis not present

## 2022-12-23 DIAGNOSIS — E1122 Type 2 diabetes mellitus with diabetic chronic kidney disease: Secondary | ICD-10-CM | POA: Insufficient documentation

## 2022-12-23 DIAGNOSIS — Z79899 Other long term (current) drug therapy: Secondary | ICD-10-CM | POA: Diagnosis not present

## 2022-12-23 DIAGNOSIS — E785 Hyperlipidemia, unspecified: Secondary | ICD-10-CM | POA: Diagnosis not present

## 2022-12-23 DIAGNOSIS — I129 Hypertensive chronic kidney disease with stage 1 through stage 4 chronic kidney disease, or unspecified chronic kidney disease: Secondary | ICD-10-CM | POA: Diagnosis not present

## 2022-12-23 DIAGNOSIS — N182 Chronic kidney disease, stage 2 (mild): Secondary | ICD-10-CM | POA: Diagnosis not present

## 2022-12-23 DIAGNOSIS — D563 Thalassemia minor: Secondary | ICD-10-CM | POA: Diagnosis present

## 2022-12-23 DIAGNOSIS — D509 Iron deficiency anemia, unspecified: Secondary | ICD-10-CM | POA: Diagnosis not present

## 2022-12-23 LAB — CMP (CANCER CENTER ONLY)
ALT: 22 U/L (ref 0–44)
AST: 19 U/L (ref 15–41)
Albumin: 4.3 g/dL (ref 3.5–5.0)
Alkaline Phosphatase: 71 U/L (ref 38–126)
Anion gap: 6 (ref 5–15)
BUN: 28 mg/dL — ABNORMAL HIGH (ref 8–23)
CO2: 27 mmol/L (ref 22–32)
Calcium: 9.5 mg/dL (ref 8.9–10.3)
Chloride: 101 mmol/L (ref 98–111)
Creatinine: 1.26 mg/dL — ABNORMAL HIGH (ref 0.61–1.24)
GFR, Estimated: 60 mL/min — ABNORMAL LOW (ref >60)
Glucose, Bld: 199 mg/dL — ABNORMAL HIGH (ref 70–99)
Potassium: 5.3 mmol/L — ABNORMAL HIGH (ref 3.5–5.1)
Sodium: 134 mmol/L — ABNORMAL LOW (ref 135–145)
Total Bilirubin: 0.5 mg/dL (ref 0.3–1.2)
Total Protein: 7.6 g/dL (ref 6.5–8.1)

## 2022-12-23 LAB — CBC WITH DIFFERENTIAL (CANCER CENTER ONLY)
Abs Immature Granulocytes: 0.03 10*3/uL (ref 0.00–0.07)
Basophils Absolute: 0.1 10*3/uL (ref 0.0–0.1)
Basophils Relative: 1 %
Eosinophils Absolute: 0.4 10*3/uL (ref 0.0–0.5)
Eosinophils Relative: 6 %
HCT: 38.8 % — ABNORMAL LOW (ref 39.0–52.0)
Hemoglobin: 12.4 g/dL — ABNORMAL LOW (ref 13.0–17.0)
Immature Granulocytes: 0 %
Lymphocytes Relative: 26 %
Lymphs Abs: 1.9 10*3/uL (ref 0.7–4.0)
MCH: 20.2 pg — ABNORMAL LOW (ref 26.0–34.0)
MCHC: 32 g/dL (ref 30.0–36.0)
MCV: 63.2 fL — ABNORMAL LOW (ref 80.0–100.0)
Monocytes Absolute: 0.7 10*3/uL (ref 0.1–1.0)
Monocytes Relative: 9 %
Neutro Abs: 4.3 10*3/uL (ref 1.7–7.7)
Neutrophils Relative %: 58 %
Platelet Count: 273 10*3/uL (ref 150–400)
RBC: 6.14 MIL/uL — ABNORMAL HIGH (ref 4.22–5.81)
RDW: 17.7 % — ABNORMAL HIGH (ref 11.5–15.5)
WBC Count: 7.3 10*3/uL (ref 4.0–10.5)
nRBC: 0 % (ref 0.0–0.2)

## 2022-12-23 LAB — IRON AND IRON BINDING CAPACITY (CC-WL,HP ONLY)
Iron: 78 ug/dL (ref 45–182)
Saturation Ratios: 16 % — ABNORMAL LOW (ref 17.9–39.5)
TIBC: 486 ug/dL — ABNORMAL HIGH (ref 250–450)
UIBC: 408 ug/dL — ABNORMAL HIGH (ref 117–376)

## 2022-12-23 LAB — FERRITIN: Ferritin: 11 ng/mL — ABNORMAL LOW (ref 24–336)

## 2022-12-24 LAB — KAPPA/LAMBDA LIGHT CHAINS
Kappa free light chain: 46.3 mg/L — ABNORMAL HIGH (ref 3.3–19.4)
Kappa, lambda light chain ratio: 1.18 (ref 0.26–1.65)
Lambda free light chains: 39.4 mg/L — ABNORMAL HIGH (ref 5.7–26.3)

## 2022-12-30 ENCOUNTER — Inpatient Hospital Stay: Payer: Medicare Other | Admitting: Hematology

## 2022-12-30 VITALS — BP 147/76 | HR 94 | Temp 97.9°F | Resp 18 | Wt 126.8 lb

## 2022-12-30 DIAGNOSIS — Z79899 Other long term (current) drug therapy: Secondary | ICD-10-CM | POA: Diagnosis not present

## 2022-12-30 DIAGNOSIS — D509 Iron deficiency anemia, unspecified: Secondary | ICD-10-CM | POA: Diagnosis not present

## 2022-12-30 DIAGNOSIS — N182 Chronic kidney disease, stage 2 (mild): Secondary | ICD-10-CM | POA: Diagnosis not present

## 2022-12-30 DIAGNOSIS — D563 Thalassemia minor: Secondary | ICD-10-CM | POA: Diagnosis not present

## 2022-12-30 DIAGNOSIS — E785 Hyperlipidemia, unspecified: Secondary | ICD-10-CM | POA: Diagnosis not present

## 2022-12-30 DIAGNOSIS — D472 Monoclonal gammopathy: Secondary | ICD-10-CM

## 2022-12-30 DIAGNOSIS — I129 Hypertensive chronic kidney disease with stage 1 through stage 4 chronic kidney disease, or unspecified chronic kidney disease: Secondary | ICD-10-CM | POA: Diagnosis not present

## 2022-12-30 DIAGNOSIS — Z87891 Personal history of nicotine dependence: Secondary | ICD-10-CM | POA: Diagnosis not present

## 2022-12-30 DIAGNOSIS — E1122 Type 2 diabetes mellitus with diabetic chronic kidney disease: Secondary | ICD-10-CM | POA: Diagnosis not present

## 2022-12-30 LAB — MULTIPLE MYELOMA PANEL, SERUM
Albumin SerPl Elph-Mcnc: 3.6 g/dL (ref 2.9–4.4)
Albumin/Glob SerPl: 1.1 (ref 0.7–1.7)
Alpha 1: 0.2 g/dL (ref 0.0–0.4)
Alpha2 Glob SerPl Elph-Mcnc: 0.8 g/dL (ref 0.4–1.0)
B-Globulin SerPl Elph-Mcnc: 1 g/dL (ref 0.7–1.3)
Gamma Glob SerPl Elph-Mcnc: 1.3 g/dL (ref 0.4–1.8)
Globulin, Total: 3.3 g/dL (ref 2.2–3.9)
IgA: 220 mg/dL (ref 61–437)
IgG (Immunoglobin G), Serum: 1314 mg/dL (ref 603–1613)
IgM (Immunoglobulin M), Srm: 65 mg/dL (ref 15–143)
M Protein SerPl Elph-Mcnc: 0.9 g/dL — ABNORMAL HIGH
Total Protein ELP: 6.9 g/dL (ref 6.0–8.5)

## 2022-12-30 NOTE — Progress Notes (Signed)
HEMATOLOGY/ONCOLOGY CLINIC NOTE  Date of Service: 12/30/2022  Patient Care Team: Lucky Cowboy, MD as PCP - General (Internal Medicine) Rollene Rotunda, MD as PCP - Cardiology (Cardiology) Marzella Schlein., MD as Consulting Physician (Ophthalmology) Rollene Rotunda, MD as Consulting Physician (Cardiology) Vida Rigger, MD as Consulting Physician (Gastroenterology) Jodi Geralds, MD as Consulting Physician (Orthopedic Surgery)  CHIEF COMPLAINTS/PURPOSE OF CONSULTATION:  Evaluation and management of abnormal red blood cells. (Microcytic relative polycythemia)  HISTORY OF PRESENTING ILLNESS:   William Haney is a wonderful 72 y.o. male who has been referred to [place] by Lucky Cowboy, MD for evaluation and management of abnormal red blood cells.   Today, he is accompanied by his daughter. He complains of fluctuating stomach symptoms over the past 3-4 months. His symptoms include bloating, gas, increased hunger, loss of appetite, and cramping. He does also endorse a prolonged sense of fullness after meals and irregular bowel habits, but has normal stools. He denies any black stools or blood in stools. Patient has lost 10 pounds over the last 3-4 months. He denies any ulcers or gastritis in the past. He does not notice symptoms with certain foods. He denies any stressful triggers. His daughter reports that patient's current diet consists of soups/salad and lacks protein.  Patient does endorse general upper abdominal pain on palpation.  His abdominal pain is intermittent and he notices pain when not eating. He denies any lower abdominal pain, leg edema, or change in urination habits. He denies any previous gallbladder/liver issues. Patient is a previous smoker, he discontinued in 2007, prior to receiving a bypass surgery.   Patient does confirm that he has had DM for 20 years. He does have tingling/numbness in two fingers of his right hand. Patient does take Ozempic generally, but has not for the  last couple of months. Patient has been on Metformin for 20 years and denies taking any new medications. Patient continues to lose weight. His weight in clinic today is 109 pounds.   Patient denies any blood disorders in the past or in the family. Patient reports having a normal routine colonoscopy/endoscopy 13-14 years ago. Patient denies any new fatigue or change in energy level. Patient does take a multivitamin on a regular basis.   INTERVAL HISTORY: William Haney is a wonderful 72 y.o. male who is connected via phone for evaluation and management of abnormal red blood cells.   I last connected with the patient on 07/16/2022 and he was doing well overall.   Patient notes he has been doing well overall since our last visit. He notes he discontinued Metoprolol last week due to low blood pressure and dizziness. Patient notes his blood pressure in the morning was 90/54, but his blood pressure during this visit is 147/76. He denies losing weight.   He denies any new infection issues, SOB, fever, chills, night sweats, abdominal pain, chest pain, back pain, or leg swelling. He denies any abnormal bleeding, black stools, or blood in stool. He does complain of left shoulder pain and right knee pain.  Patient notes he is due for colonoscopy.   MEDICAL HISTORY:  Past Medical History:  Diagnosis Date   Acute metabolic encephalopathy 06/08/2020   Anemia    Carotid artery stenosis 01/2017   L-ICA 1-39%, R-ICA 40-59% doppler   Coronary artery disease    s/p CABG in 2008, w/ LIMA-LAD, SVG-Diag, SVG-OM, SVG-PL   Hypertension    Vitamin D deficiency   DM2 Ex smoker  SURGICAL HISTORY: Past Surgical History:  Procedure Laterality Date   CORONARY ARTERY BYPASS GRAFT  2008   LIMA to the LAD, saphenous vein graft to diagonal, saphenous vein graft to OM, saphenous vein graft to posterolateral 2008   TOTAL KNEE ARTHROPLASTY Left 12/18/2018   Procedure: TOTAL KNEE ARTHROPLASTY;  Surgeon: Jodi Geralds, MD;   Location: [facility];  Service: Orthopedics;  Laterality: Left;    SOCIAL HISTORY: Social History   Socioeconomic History   Marital status: Married    Spouse name: Not on file   Number of children: Not on file   Years of education: Not on file   Highest education level: Not on file  Occupational History   Occupation: retired  Tobacco Use   Smoking status: Former    Current packs/day: 0.00    Average packs/day: 1 pack/day for 34.5 years (34.5 ttl pk-yrs)    Types: Cigarettes    Start date: 62    Quit date: 08/28/2005    Years since quitting: 17.3   Smokeless tobacco: Never  Vaping Use   Vaping status: Never Used  Substance and Sexual Activity   Alcohol use: No   Drug use: No   Sexual activity: Yes  Other Topics Concern   Not on file  Social History Narrative   Not on file   Social Determinants of Health   Financial Resource Strain: Not on file  Food Insecurity: Food Insecurity Present (06/27/2022)   Hunger Vital Sign    Worried About Running Out of Food in the Last Year: Sometimes true    Ran Out of Food in the Last Year: Never true  Transportation Needs: No Transportation Needs (06/27/2022)   PRAPARE - Administrator, Civil Service (Medical): No    Lack of Transportation (Non-Medical): No  Physical Activity: Not on file  Stress: Not on file  Social Connections: Not on file  Intimate Partner Violence: Not At Risk (06/27/2022)   Humiliation, Afraid, Rape, and Kick questionnaire    Fear of Current or Ex-Partner: No    Emotionally Abused: No    Physically Abused: No    Sexually Abused: No    FAMILY HISTORY: Family History  Problem Relation Age of Onset   Hypertension Father    Heart disease Father    Hyperlipidemia Father    Heart attack Father    Diabetes Sister    Heart disease Brother     ALLERGIES:  is allergic to ace inhibitors and statins.  MEDICATIONS:  Current Outpatient Medications  Medication Sig Dispense Refill   aspirin EC 81 MG  tablet Take 81 mg by mouth daily. Swallow whole.     blood glucose meter kit and supplies Test sugars once to twice daily 1 each 0   Cholecalciferol (VITAMIN D3) 125 MCG (5000 UT) CAPS Take 5,000 Units by mouth daily.     co-enzyme Q-10 30 MG capsule Take 30 mg by mouth 3 (three) times daily.     Cyanocobalamin (VITAMIN B-12 PO) Take by mouth.     dicyclomine (BENTYL) 20 MG tablet TAKE 1/2 TO 1 TABLET 4 TIMES A DAY BEFORE MEALS & BEDTIME FOR NAUSEA, CRAMPING, BLOATING OR DIARRHEA 300 tablet 1   empagliflozin (JARDIANCE) 25 MG TABS tablet Take 1 tablet (25 mg total) by mouth daily. 90 tablet 3   glipiZIDE (GLUCOTROL) 5 MG tablet Take twice a day with meals if blood sugar is over 120 60 tablet 11   metFORMIN (GLUCOPHAGE) 500 MG tablet TAKE 2 TABLETS BY MOUTH TWICE A DAY WITH  MEALS FOR DIABETES 360 tablet 3   metoprolol tartrate (LOPRESSOR) 25 MG tablet TAKE 1 TABLET TWICE A DAY (EVERY 12 HOURS) FOR BLOOD PRESSURE 180 tablet 1   Multiple Vitamin (MULTIVITAMIN) tablet Take 1 tablet by mouth daily.     rosuvastatin (CRESTOR) 20 MG tablet TAKE 1 TABLET BY MOUTH EVERY DAY 90 tablet 3   telmisartan (MICARDIS) 20 MG tablet TAKE 1 TABLET BY MOUTH DAILY FOR BLOOD PRESSURE AND KIDNEY PROTECTION 90 tablet 3   No current facility-administered medications for this visit.    REVIEW OF SYSTEMS:    10 Point review of Systems was done is negative except as noted above.  PHYSICAL EXAMINATION: .BP (!) 147/76   Pulse 94   Temp 97.9 F (36.6 C)   Resp 18   Wt 126 lb 12.8 oz (57.5 kg)   SpO2 100%   BMI 23.96 kg/m    GENERAL:alert, in no acute distress and comfortable SKIN: no acute rashes, no significant lesions EYES: conjunctiva are pink and non-injected, sclera anicteric OROPHARYNX: MMM, no exudates, no oropharyngeal erythema or ulceration NECK: supple, no JVD LYMPH:  no palpable lymphadenopathy in the cervical, axillary or inguinal regions LUNGS: clear to auscultation b/l with normal respiratory  effort HEART: regular rate & rhythm ABDOMEN:  normoactive bowel sounds , non tender, not distended. Extremity: no pedal edema PSYCH: alert & oriented x 3 with fluent speech NEURO: no focal motor/sensory deficits   LABORATORY DATA:  I have reviewed the data as listed  .    Latest Ref Rng & Units 12/23/2022   10:50 AM 10/21/2022    9:57 AM 06/27/2022   12:04 PM  CBC  WBC 4.0 - 10.5 K/uL 7.3  6.1    Hemoglobin 13.0 - 17.0 g/dL 81.1  91.4    Hematocrit 39.0 - 52.0 % 38.8  41.2  36.9   Platelets 150 - 400 K/uL 273  321     .    Latest Ref Rng & Units 12/23/2022   10:50 AM 10/21/2022    9:57 AM 06/27/2022   12:03 PM  CMP  Glucose 70 - 99 mg/dL 782  956  213   BUN 8 - 23 mg/dL 28  17  13    Creatinine 0.61 - 1.24 mg/dL 0.86  5.78  4.69   Sodium 135 - 145 mmol/L 134  135  136   Potassium 3.5 - 5.1 mmol/L 5.3  5.7  4.9   Chloride 98 - 111 mmol/L 101  102  102   CO2 22 - 32 mmol/L 27  28  27    Calcium 8.9 - 10.3 mg/dL 9.5  9.8  9.8   Total Protein 6.5 - 8.1 g/dL 7.6  7.0  7.3   Total Bilirubin 0.3 - 1.2 mg/dL 0.5  0.3  0.4   Alkaline Phos 38 - 126 U/L 71   77   AST 15 - 41 U/L 19  19  27    ALT 0 - 44 U/L 22  20  30     .CBC    Component Value Date/Time   WBC 7.3 12/23/2022 1050   WBC 6.1 10/21/2022 0957   RBC 6.14 (H) 12/23/2022 1050   HGB 12.4 (L) 12/23/2022 1050   HCT 38.8 (L) 12/23/2022 1050   HCT 36.9 (L) 06/27/2022 1204   PLT 273 12/23/2022 1050   MCV 63.2 (L) 12/23/2022 1050   MCH 20.2 (L) 12/23/2022 1050   MCHC 32.0 12/23/2022 1050   RDW 17.7 (H) 12/23/2022 1050  LYMPHSABS 1.9 12/23/2022 1050   MONOABS 0.7 12/23/2022 1050   EOSABS 0.4 12/23/2022 1050   BASOSABS 0.1 12/23/2022 1050   . Lab Results  Component Value Date   IRON 78 12/23/2022   TIBC 486 (H) 12/23/2022   IRONPCTSAT 16 (L) 12/23/2022   (Iron and TIBC)  Lab Results  Component Value Date   FERRITIN 11 (L) 12/23/2022   Component     Latest Ref Rng 06/27/2022  Folate, Hemolysate     Not  Estab. ng/mL 611.0   HCT     37.5 - 51.0 % 36.9 (L)   Folate, RBC     >498 ng/mL 1,656   Vitamin B12     180 - 914 pg/mL 409   LDH     98 - 192 U/L 110     Legend: (L) Low  Contains abnormal data Hgb Fractionation Cascade Order: [id number] Status: Final result     Visible to patient: Yes (not seen)     Next appt: 10/21/2022 at 09:00 AM in Internal Medicine (DANA Hollie Salk, NP)     Dx: Microcytic anemia   0 Result Notes     Component Ref Range & Units 3 wk ago 4 yr ago  Hgb F 0.0 - 2.0 % 0.0   Hgb A 96.4 - 98.8 % 94.8 Low  94.3 Low  R  Hgb A2 1.8 - 3.2 % 5.2 High    Hgb S 0.0 % 0.0   Interpretation, Hgb Fract Comment   Comment: (NOTE) Hemoglobin pattern and concentrations are consistent with beta- Thalassemia minor. Suggest hematologic and clinical correlation. Performed At: [facility] [address] [place], [place] [id number]     Cristine Polio Order: [id number] Status: Final result     Visible to patient: Yes (not seen)     Next appt: 10/21/2022 at 09:00 AM in Internal Medicine (DANA Hollie Salk, NP)     Dx: Microcytic anemia   0 Result Notes    Component 3 wk ago  Alpha-Thalassemia Comment:  Comment: (NOTE) Test: Alpha-Thalassemia, DNA Analysis Result:     Negative, alpha alpha/alpha alpha      tains abnormal data Multiple Myeloma Panel (SPEP&IFE w/QIG) Order: [id number] Status: Edited Result - FINAL     Visible to patient: Yes (not seen)     Next appt: 10/21/2022 at 09:00 AM in Internal Medicine (DANA Hollie Salk, NP)     Dx: Microcytic anemia   0 Result Notes    Component Ref Range & Units 3 wk ago  IgG (Immunoglobin G), Serum 603 - 1,613 mg/dL 4,034  IgA 61 - 742 mg/dL 595  IgM (Immunoglobulin M), Srm 15 - 143 mg/dL 52  Total Protein ELP 6.0 - 8.5 g/dL 6.6 VC  Albumin SerPl Elph-Mcnc 2.9 - 4.4 g/dL 3.1 VC  Alpha 1 0.0 - 0.4 g/dL 0.3 VC  Alpha2 Glob SerPl Elph-Mcnc 0.4 - 1.0 g/dL 1.2 High  VC  B-Globulin  SerPl Elph-Mcnc 0.7 - 1.3 g/dL 1.0 VC  Gamma Glob SerPl Elph-Mcnc 0.4 - 1.8 g/dL 1.1 VC  M Protein SerPl Elph-Mcnc Not Observed g/dL 0.5 High  VC  Globulin, Total 2.2 - 3.9 g/dL 3.5 VC  Albumin/Glob SerPl 0.7 - 1.7 0.9 VC  IFE 1 Comment Abnormal  VC  Comment: (NOTE) Immunofixation shows IgG monoclonal protein with lambda light chain specificity.      RADIOGRAPHIC STUDIES: I have personally reviewed the radiological images as listed and agreed with the findings in the report. No results found.  ASSESSMENT & PLAN:   Wonderful 72 y.o. male of [place] descent with:  Microcytic Anemia with relative polycythemia --testing confirmed no significant iron deficiency.  Hemoglobin Electrophoresis shows results consistent with beta thalassemia minor.  Alpha thalassemia gene deletion testing negative.  Type 2 diabetes mellitus with stage 2 chronic kidney disease, without long-term current use of insulin  Essential hypertension Hyperlipidemia with Type 2 Diabetes Mellitus  Dyspeptic symptoms being evaluated by GI -- will need w/u ? PUD vs gastroparesis vs exocrine pancreatic inssufficiency vs medication side effects  PLAN: -Discussed lab results from 12/23/2022 in detail with the patient. CBC showed slightly decreased hemoglobin at 12.4 g/dL and slightly decreased hematocrit of 38.8%.  CMP shows elevated Bun at 28, elevated creatinine at 1.26, elevated glucose level of 199.  -Low iron saturation ratio of 16% with ferritin of 11, mild iron deficiency. Kappa/lambda light ratio normal at 1.18. Slightly elevated potassium level. -Multiple myeloma panel results from 12/23/2022 shows elevated M-Protein of 0.9. Elevated from 0.5. -Discussed with the patient that most likely the elevated M-protein will need to be monitored. Could be slightly higher from hemoconcentration. -Discussed the option of repeat lab workup 4 months due to elevated M-protein and PET scan. Patient agrees with lab-workup for  now.  -If M-protein is elevated in 43-months, patient will need PET or CT scan and bone marrow biopsy.  -Recommend to stay well-hydrated, and drink at least 2 L of water.  -Answered all of patient's questions.  -Recommend iron supplement.  FOLLOW-UP: Phone visit with Dr Candise Che in 4 months Labs 7-10 days prior to phone visit   The total time spent in the appointment was 21 minutes* .  All of the patient's questions were answered with apparent satisfaction. The patient knows to call the clinic with any problems, questions or concerns.   Wyvonnia Lora MD MS AAHIVMS [facility] [place] Hematology/Oncology Physician [facility] [address]  .*Total Encounter Time as defined by the Centers for Medicare and Medicaid Services includes, in addition to the face-to-face time of a patient visit (documented in the note above) non-face-to-face time: obtaining and reviewing outside history, ordering and reviewing medications, tests or procedures, care coordination (communications with other health care professionals or caregivers) and documentation in the medical record.   I,Param Shah,acting as a Neurosurgeon for Wyvonnia Lora, MD.,have documented all relevant documentation on the behalf of Wyvonnia Lora, MD,as directed by  Wyvonnia Lora, MD while in the presence of Wyvonnia Lora, MD.  .I have reviewed the above documentation for accuracy and completeness, and I agree with the above. Johney Maine MD

## 2023-01-01 ENCOUNTER — Telehealth: Payer: Self-pay | Admitting: Hematology

## 2023-01-01 NOTE — Telephone Encounter (Signed)
Per Candise Che 10/21 los Left patient a message in regards to  scheduling next follow up visit, left callback when ready to schedule

## 2023-01-20 ENCOUNTER — Encounter: Payer: Self-pay | Admitting: Internal Medicine

## 2023-01-20 DIAGNOSIS — E1165 Type 2 diabetes mellitus with hyperglycemia: Secondary | ICD-10-CM | POA: Insufficient documentation

## 2023-01-20 DIAGNOSIS — I700 Atherosclerosis of aorta: Secondary | ICD-10-CM | POA: Insufficient documentation

## 2023-01-20 NOTE — Patient Instructions (Signed)

## 2023-01-20 NOTE — Progress Notes (Signed)
History of Present Illness:   Future Appointments  Date Time Provider Department  01/21/2023                         3 mo ov  9:30 AM Lucky Cowboy, MD [facility]  04/28/2023                       wellness 11:00 AM Raynelle Dick, NP [facility]  10/21/2023                        cpe  9:00 AM Raynelle Dick, NP [facility]        This very nice 72 y.o. married  man presents for 3 month follow up with HTN, HLD, T2_NIDDM and Vitamin D Deficiency. Patient is also being followed closely by Dr Candise Che for alpha Thalassemia & recently found elevated M protein of  uncertain significance.   Abd CT scan 07/30/2022  showed Aortic Atherosclerosis (HCC).       Patient is treated for HTN & BP has been controlled at home. Today's BP is at goal - 130/68.   In 2008, patient underwent a CABG and is also followed by Dr Antoine Poche.  Patient has had no complaints of any cardiac type chest pain, palpitations, dyspnea Pollyann Kennedy /PND, dizziness, claudication or dependent edema.        Hyperlipidemia is controlled with diet & Rosuvastatin. Patient denies myalgias or other med SE's. Last Lipids were at goal :  Lab Results  Component Value Date   CHOL 117 10/21/2022   HDL 39 (L) 10/21/2022   LDLCALC 59 10/21/2022   TRIG 107 10/21/2022   CHOLHDL 3.0 10/21/2022     Also, the patient has history of T2_NIDDM since 2000 and has had no symptoms of reactive hypoglycemia, diabetic polys, paresthesias or visual blurring.   Patient relate\s that he had stopped his Trulicity & only restarted it 1 week ago.  Last A1c on Metformin, Glimepiride and Jardiance was not at goal :   Lab Results  Component Value Date   HGBA1C 10.3 (H) 10/21/2022   Wt Readings from Last 3 Encounters:  01/21/23 130 lb 6.4 oz (59.1 kg)  12/30/22 126 lb 12.8 oz (57.5 kg)  11/13/22 123 lb (55.8 kg)            Further, the patient also has history of Vitamin D Deficiency ("37" / 2017)  and supplements vitamin D without any  suspected side-effects. Last vitamin D was at goal:  Lab Results  Component Value Date   VD25OH 64 10/21/2022     Current Outpatient Medications:    aspirin EC 81 MG tablet, Take daily. Swallow whole., Disp: , Rfl:    blood glucose meter kit and supplies, Test sugars once to twice daily, Disp: 1 each, Rfl: 0   Cholecalciferol (VITAMIN D3) 125 MCG (5000 UT) CAPS, Take 5,000 Units daily., Disp: , Rfl:    co-enzyme Q-10 30 MG capsule, Take 30 mg by mouth 3 (three) times daily., Disp: , Rfl:    Cyanocobalamin (VITAMIN B-12), Take daily, Disp: , Rfl:    dicyclomine (BENTYL) 20 MG tablet, TAKE 1/2 TO 1 TABLET 4 TIMES A DAY BEFORE MEALS & BEDTIME FOR NAUSEA, CRAMPING, BLOATING OR DIARRHEA, Disp: 300 tablet, Rfl: 1   empagliflozin (JARDIANCE) 25 MG TABS tablet, Take 1 tablet  daily., Disp: 90 tablet, Rfl: 3   glipiZIDE (GLUCOTROL) 5 MG  tablet, Take twice a day with meals if blood sugar is over 120, Disp: 60 tablet, Rfl: 11   metFORMIN (GLUCOPHAGE) 500 MG tablet, TAKE 2 TABLETS TWICE A DAY WITH MEALS FOR DIABETES, Disp: 360 tablet, Rfl: 3   metoprolol tartrate (LOPRESSOR) 25 MG tablet, TAKE 1 TABLET TWICE A DAY (EVERY 12 HOURS) FOR BLOOD PRESSURE, Disp: 180 tablet, Rfl: 1   Multiple Vitamin (MULTIVITAMIN) tablet, Take 1 tablet daily., Disp: , Rfl:    rosuvastatin (CRESTOR) 20 MG tablet, TAKE 1 TABLET EVERY DAY, Disp: 90 tablet, Rfl: 3   telmisartan (MICARDIS) 20 MG tablet, TAKE 1 TABLET DAILY FOR BLOOD PRESSURE AND KIDNEY PROTECTION, Disp: 90 tablet, Rfl: 3    Allergies  Allergen Reactions   Ace Inhibitors Cough   Statins Other (See Comments)    Elevated LFT's    PMHx:   Past Medical History:  Diagnosis Date   Acute metabolic encephalopathy 06/08/2020   Anemia    Carotid artery stenosis 01/2017   L-ICA 1-39%, R-ICA 40-59% doppler   Coronary artery disease    s/p CABG in 2008, w/ LIMA-LAD, SVG-Diag, SVG-OM, SVG-PL   Hypertension    Vitamin D deficiency     Immunization History   Administered Date(s) Administered   DT (Pediatric) 02/07/2015   Influenza Whole 12/10/2012   Influenza, High Dose Seasonal PF 02/07/2015, 01/02/2016, 12/13/2016, 01/01/2018, 11/19/2018, 02/21/2020, 01/10/2021, 01/10/2022   Influenza-Unspecified 12/31/2013   Meningococcal Conjugate 09/03/2016   PFIZER(Purple Top)SARS-COV-2 Vaccination 04/02/2019, 04/23/2019   Pfizer Covid-19 Vaccine Bivalent Booster 76yrs & up 10/22/2021   Pneumococcal Conjugate-13 02/02/2014   Pneumococcal Polysaccharide-23 04/13/2003, 10/08/2016   Td 04/18/2004   Unspecified SARS-COV-2 Vaccination 12/08/2021    Past Surgical History:  Procedure Laterality Date   CORONARY ARTERY BYPASS GRAFT  2008   LIMA to the LAD, saphenous vein graft to diagonal, saphenous vein graft to OM, saphenous vein graft to posterolateral 2008   TOTAL KNEE ARTHROPLASTY Left 12/18/2018   Procedure: TOTAL KNEE ARTHROPLASTY;  Surgeon: Jodi Geralds, MD;  Location: [facility];  Service: Orthopedics;  Laterality: Left;    FHx:    Reviewed / unchanged  SHx:    Reviewed / unchanged   Systems Review:  Constitutional: Denies fever, chills, wt changes, headaches, insomnia, fatigue, night sweats, change in appetite. Eyes: Denies redness, blurred vision, diplopia, discharge, itchy, watery eyes.  ENT: Denies discharge, congestion, post nasal drip, epistaxis, sore throat, earache, hearing loss, dental pain, tinnitus, vertigo, sinus pain, snoring.  CV: Denies chest pain, palpitations, irregular heartbeat, syncope, dyspnea, diaphoresis, orthopnea, PND, claudication or edema. Respiratory: denies cough, dyspnea, DOE, pleurisy, hoarseness, laryngitis, wheezing.  Gastrointestinal: Denies dysphagia, odynophagia, heartburn, reflux, water brash, abdominal pain or cramps, nausea, vomiting, bloating, diarrhea, constipation, hematemesis, melena, hematochezia  or hemorrhoids. Genitourinary: Denies dysuria, frequency, urgency, nocturia, hesitancy, discharge, hematuria or  flank pain. Musculoskeletal: Denies arthralgias, myalgias, stiffness, jt. swelling, pain, limping or strain/sprain.  Skin: Denies pruritus, rash, hives, warts, acne, eczema or change in skin lesion(s). Neuro: No weakness, tremor, incoordination, spasms, paresthesia or pain. Psychiatric: Denies confusion, memory loss or sensory loss. Endo: Denies change in weight, skin or hair change.  Heme/Lymph: No excessive bleeding, bruising or enlarged lymph nodes.  Physical Exam  BP 130/68   Pulse 80   Temp 98 F (36.7 C)   Resp 16   Ht 5\' 1"  (1.549 m)   Wt 130 lb 6.4 oz (59.1 kg)   SpO2 96%   BMI 24.64 kg/m   Appears  well nourished, well groomed  and in no distress.  Eyes: PERRLA, EOMs, conjunctiva no swelling or erythema. Sinuses: No frontal/maxillary tenderness ENT/Mouth: EAC's clear, TM's nl w/o erythema, bulging. Nares clear w/o erythema, swelling, exudates. Oropharynx clear without erythema or exudates. Oral hygiene is good. Tongue normal, non obstructing. Hearing intact.  Neck: Supple. Thyroid not palpable. Car 2+/2+ without bruits, nodes or JVD. Chest: Respirations nl with BS clear & equal w/o rales, rhonchi, wheezing or stridor.  Cor: Heart sounds normal w/ regular rate and rhythm without sig. murmurs, gallops, clicks or rubs. Peripheral pulses normal and equal  without edema.  Abdomen: Soft & bowel sounds normal. Non-tender w/o guarding, rebound, hernias, masses or organomegaly.  Lymphatics: Unremarkable.  Musculoskeletal: Full ROM all peripheral extremities, joint stability, 5/5 strength and normal gait.  Skin: Warm, dry without exposed rashes, lesions or ecchymosis apparent.  Neuro: Cranial nerves intact, reflexes equal bilaterally. Sensory-motor testing grossly intact. Tendon reflexes grossly intact.  Pysch: Alert & oriented x 3.  Insight and judgement nl & appropriate. No ideations.  Assessment and Plan:   1. Essential hypertension  - CBC with Differential/Platelet -  COMPLETE METABOLIC PANEL WITH GFR - Magnesium - TSH  2. Hyperlipidemia associated with type 2 diabetes mellitus (HCC)  - Lipid panel - TSH  3. Type 2 diabetes mellitus with stage 2 chronic kidney disease, without long-term current use of insulin (HCC)  - Hemoglobin A1c - Insulin, random  4. Vitamin D deficiency  - VITAMIN D 25 Hydroxy  5. Medication management  - CBC with Differential/Platelet - COMPLETE METABOLIC PANEL WITH GFR - Magnesium - Lipid panel - TSH - Hemoglobin A1c - Insulin, random - VITAMIN D 25 Hydroxy          Discussed  regular exercise, BP monitoring, weight control to achieve/maintain BMI less than 25 and discussed med and SE's. Recommended labs to assess and monitor clinical status with further disposition pending results of labs.  I discussed the assessment and treatment plan with the patient. The patient was provided an opportunity to ask questions and all were answered. The patient agreed with the plan and demonstrated an understanding of the instructions.  I provided over 30 minutes of exam, counseling, chart review and  complex critical decision making.   Marinus Maw, MD

## 2023-01-21 ENCOUNTER — Other Ambulatory Visit: Payer: Self-pay

## 2023-01-21 ENCOUNTER — Ambulatory Visit: Payer: Medicare Other | Admitting: Nurse Practitioner

## 2023-01-21 ENCOUNTER — Encounter: Payer: Self-pay | Admitting: Internal Medicine

## 2023-01-21 ENCOUNTER — Ambulatory Visit (INDEPENDENT_AMBULATORY_CARE_PROVIDER_SITE_OTHER): Payer: Medicare Other | Admitting: Internal Medicine

## 2023-01-21 VITALS — BP 130/68 | HR 80 | Temp 98.0°F | Resp 16 | Ht 61.0 in | Wt 130.4 lb

## 2023-01-21 DIAGNOSIS — N182 Chronic kidney disease, stage 2 (mild): Secondary | ICD-10-CM | POA: Diagnosis not present

## 2023-01-21 DIAGNOSIS — I700 Atherosclerosis of aorta: Secondary | ICD-10-CM

## 2023-01-21 DIAGNOSIS — Z79899 Other long term (current) drug therapy: Secondary | ICD-10-CM

## 2023-01-21 DIAGNOSIS — E1165 Type 2 diabetes mellitus with hyperglycemia: Secondary | ICD-10-CM | POA: Diagnosis not present

## 2023-01-21 DIAGNOSIS — E559 Vitamin D deficiency, unspecified: Secondary | ICD-10-CM | POA: Diagnosis not present

## 2023-01-21 DIAGNOSIS — E1169 Type 2 diabetes mellitus with other specified complication: Secondary | ICD-10-CM

## 2023-01-21 DIAGNOSIS — E1129 Type 2 diabetes mellitus with other diabetic kidney complication: Secondary | ICD-10-CM

## 2023-01-21 DIAGNOSIS — E785 Hyperlipidemia, unspecified: Secondary | ICD-10-CM

## 2023-01-21 DIAGNOSIS — I10 Essential (primary) hypertension: Secondary | ICD-10-CM

## 2023-01-21 DIAGNOSIS — E1122 Type 2 diabetes mellitus with diabetic chronic kidney disease: Secondary | ICD-10-CM

## 2023-01-21 MED ORDER — EMPAGLIFLOZIN 25 MG PO TABS: 25.0000 mg | ORAL_TABLET | Freq: Every day | ORAL | 3 refills | Status: DC

## 2023-01-22 LAB — CBC WITH DIFFERENTIAL/PLATELET
Absolute Lymphocytes: 1152 {cells}/uL (ref 850–3900)
Absolute Monocytes: 683 {cells}/uL (ref 200–950)
Basophils Absolute: 48 {cells}/uL (ref 0–200)
Basophils Relative: 0.7 %
Eosinophils Absolute: 373 {cells}/uL (ref 15–500)
Eosinophils Relative: 5.4 %
HCT: 40.2 % (ref 38.5–50.0)
Hemoglobin: 11.7 g/dL — ABNORMAL LOW (ref 13.2–17.1)
MCH: 19.5 pg — ABNORMAL LOW (ref 27.0–33.0)
MCHC: 29.1 g/dL — ABNORMAL LOW (ref 32.0–36.0)
MCV: 67.1 fL — ABNORMAL LOW (ref 80.0–100.0)
Monocytes Relative: 9.9 %
Neutro Abs: 4644 {cells}/uL (ref 1500–7800)
Neutrophils Relative %: 67.3 %
Platelets: 310 10*3/uL (ref 140–400)
RBC: 5.99 10*6/uL — ABNORMAL HIGH (ref 4.20–5.80)
RDW: 18.4 % — ABNORMAL HIGH (ref 11.0–15.0)
Total Lymphocyte: 16.7 %
WBC: 6.9 10*3/uL (ref 3.8–10.8)

## 2023-01-22 LAB — COMPLETE METABOLIC PANEL WITH GFR
AG Ratio: 1.4 (calc) (ref 1.0–2.5)
ALT: 19 U/L (ref 9–46)
AST: 17 U/L (ref 10–35)
Albumin: 4 g/dL (ref 3.6–5.1)
Alkaline phosphatase (APISO): 72 U/L (ref 35–144)
BUN: 11 mg/dL (ref 7–25)
CO2: 28 mmol/L (ref 20–32)
Calcium: 10.3 mg/dL (ref 8.6–10.3)
Chloride: 99 mmol/L (ref 98–110)
Creat: 0.91 mg/dL (ref 0.70–1.28)
Globulin: 2.8 g/dL (ref 1.9–3.7)
Glucose, Bld: 186 mg/dL — ABNORMAL HIGH (ref 65–99)
Potassium: 5.4 mmol/L — ABNORMAL HIGH (ref 3.5–5.3)
Sodium: 135 mmol/L (ref 135–146)
Total Bilirubin: 0.4 mg/dL (ref 0.2–1.2)
Total Protein: 6.8 g/dL (ref 6.1–8.1)
eGFR: 88 mL/min/{1.73_m2} (ref >=60)

## 2023-01-22 LAB — HEMOGLOBIN A1C
Hgb A1c MFr Bld: 11.6 %{Hb} — ABNORMAL HIGH (ref <5.7)
Mean Plasma Glucose: 286 mg/dL
eAG (mmol/L): 15.9 mmol/L

## 2023-01-22 LAB — LIPID PANEL
Cholesterol: 144 mg/dL (ref <200)
HDL: 46 mg/dL (ref >=40)
LDL Cholesterol (Calc): 78 mg/dL
Non-HDL Cholesterol (Calc): 98 mg/dL (ref <130)
Total CHOL/HDL Ratio: 3.1 (calc) (ref <5.0)
Triglycerides: 118 mg/dL (ref <150)

## 2023-01-22 LAB — TSH: TSH: 3.25 m[IU]/L (ref 0.40–4.50)

## 2023-01-22 LAB — INSULIN, RANDOM: Insulin: 5.9 u[IU]/mL

## 2023-01-22 LAB — CBC MORPHOLOGY

## 2023-01-22 LAB — VITAMIN D 25 HYDROXY (VIT D DEFICIENCY, FRACTURES): Vit D, 25-Hydroxy: 39 ng/mL (ref 30–100)

## 2023-01-22 LAB — MAGNESIUM: Magnesium: 1.5 mg/dL (ref 1.5–2.5)

## 2023-01-22 NOTE — Progress Notes (Signed)
<>*<>*<>*<>*<>*<>*<>*<>*<>*<>*<>*<>*<>*<>*<>*<>*<>*<>*<>*<>*<>*<>*<>*<>*<> <>*<>*<>*<>*<>*<>*<>*<>*<>*<>*<>*<>*<>*<>*<>*<>*<>*<>*<>*<>*<>*<>*<>*<>*<>  -Test results slightly outside the reference range are not unusual. If there is anything important, I will review this with you,  otherwise it is considered normal test values.  If you have further questions,  please do not hesitate to contact me at the office or via My Chart.   <>*<>*<>*<>*<>*<>*<>*<>*<>*<>*<>*<>*<>*<>*<>*<>*<>*<>*<>*<>*<>*<>*<>*<>*<> <>*<>*<>*<>*<>*<>*<>*<>*<>*<>*<>*<>*<>*<>*<>*<>*<>*<>*<>*<>*<>*<>*<>*<>*<>  -  Mild Chronic Anemia is about the same & Stable  <>*<>*<>*<>*<>*<>*<>*<>*<>*<>*<>*<>*<>*<>*<>*<>*<>*<>*<>*<>*<>*<>*<>*<>*<> <>*<>*<>*<>*<>*<>*<>*<>*<>*<>*<>*<>*<>*<>*<>*<>*<>*<>*<>*<>*<>*<>*<>*<>*<>  -  Glucose = 186 is way too high  !                          ( ( Goal is less than 120 mg% ) ) And   - A1c = 11.6 is dangerously high                            ( ( Goal is less  than 5.7 %  ) )  - It is EXTREMELY Important that you                                                        take your Diabetic medicines as Prescribed  !  !  !   That is to Take your Metformin  2 tablets 2 x  / day with Meals                                                                                                which is 4 tablets / day ! And  - Glipizide 5 mg  2 x /day with your 2 larger meals of  the day .   And - Take your Jardiance  1 tablet 1 x /day.   ================================================================  - Please check your blood sugars  2 x / day before Breakfast & Supper  &   "WRITE DOWN " & make a list   AND              Schedule an office visit in about 2 weeks so we can do more blood tests & check for Insulin antibodies.  <>*<>*<>*<>*<>*<>*<>*<>*<>*<>*<>*<>*<>*<>*<>*<>*<>*<>*<>*<>*<>*<>*<>*<>*<> <>*<>*<>*<>*<>*<>*<>*<>*<>*<>*<>*<>*<>*<>*<>*<>*<>*<>*<>*<>*<>*<>*<>*<>*<>  -  Magnesium = 1.5  is  Very Low - goal is betw 2.0 - 2.5,   - So..............[name]  Recommend that you take                                                      Magnesium 500 mg tablet  2 x / day with Meals    - also important to eat lots of  leafy green vegetables - spinach - Kale   - collards - greens - okra - asparagus - broccoli - quinoa - squash - almonds   - black, red, white beans -  peas - green beans  <>*<>*<>*<>*<>*<>*<>*<>*<>*<>*<>*<>*<>*<>*<>*<>*<>*<>*<>*<>*<>*<>*<>*<>*<> <>*<>*<>*<>*<>*<>*<>*<>*<>*<>*<>*<>*<>*<>*<>*<>*<>*<>*<>*<>*<>*<>*<>*<>*<>  -  Chol = 144 - Great   - Very low risk for Heart Attack  / Stroke  <>*<>*<>*<>*<>*<>*<>*<>*<>*<>*<>*<>*<>*<>*<>*<>*<>*<>*<>*<>*<>*<>*<>*<>*<> <>*<>*<>*<>*<>*<>*<>*<>*<>*<>*<>*<>*<>*<>*<>*<>*<>*<>*<>*<>*<>*<>*<>*<>*<>  -  Vitamin D = 39  is very Low  !    - Vitamin D goal is between 70-100.   - Please make sure that you are taking your Vitamin D 5,000 units as directed.   - It is very important as a natural anti-inflammatory and helping the                           immune system protect against viral infections, like the Covid-19    helping hair, skin, and nails, as well as reducing stroke and heart attack risk.   - It helps your bones and helps with mood.  - It also decreases numerous cancer risks so please                                                                                           take it as directed.   - Low Vit D is associated with a 200-300% higher risk for CANCER   and 200-300% higher risk for HEART   ATTACK  &  STROKE.    - It is also associated with higher death rate at younger ages,   autoimmune diseases like Rheumatoid arthritis, Lupus, Multiple Sclerosis.     - Also many other serious conditions, like depression, Alzheimer's  Dementia,  muscle aches, fatigue, fibromyalgia    <>*<>*<>*<>*<>*<>*<>*<>*<>*<>*<>*<>*<>*<>*<>*<>*<>*<>*<>*<>*<>*<>*<>*<>*<> <>*<>*<>*<>*<>*<>*<>*<>*<>*<>*<>*<>*<>*<>*<>*<>*<>*<>*<>*<>*<>*<>*<>*<>*<>  -  All Else - Electrolytes - Liver - Magnesium & Thyroid  - all  Normal / OK  <>*<>*<>*<>*<>*<>*<>*<>*<>*<>*<>*<>*<>*<>*<>*<>*<>*<>*<>*<>*<>*<>*<>*<>*<> <>*<>*<>*<>*<>*<>*<>*<>*<>*<>*<>*<>*<>*<>*<>*<>*<>*<>*<>*<>*<>*<>*<>*<>*<>

## 2023-01-30 ENCOUNTER — Other Ambulatory Visit: Payer: Self-pay | Admitting: Internal Medicine

## 2023-01-30 ENCOUNTER — Other Ambulatory Visit: Payer: Self-pay | Admitting: Nurse Practitioner

## 2023-01-30 DIAGNOSIS — K581 Irritable bowel syndrome with constipation: Secondary | ICD-10-CM

## 2023-02-12 ENCOUNTER — Ambulatory Visit (INDEPENDENT_AMBULATORY_CARE_PROVIDER_SITE_OTHER): Payer: Medicare Other | Admitting: Internal Medicine

## 2023-02-12 ENCOUNTER — Encounter: Payer: Self-pay | Admitting: Internal Medicine

## 2023-02-12 VITALS — BP 110/64 | HR 96 | Temp 98.0°F | Ht 61.0 in | Wt 130.0 lb

## 2023-02-12 DIAGNOSIS — E1129 Type 2 diabetes mellitus with other diabetic kidney complication: Secondary | ICD-10-CM | POA: Diagnosis not present

## 2023-02-12 DIAGNOSIS — E1122 Type 2 diabetes mellitus with diabetic chronic kidney disease: Secondary | ICD-10-CM | POA: Diagnosis not present

## 2023-02-12 DIAGNOSIS — E1165 Type 2 diabetes mellitus with hyperglycemia: Secondary | ICD-10-CM

## 2023-02-12 DIAGNOSIS — I10 Essential (primary) hypertension: Secondary | ICD-10-CM

## 2023-02-12 DIAGNOSIS — Z79899 Other long term (current) drug therapy: Secondary | ICD-10-CM | POA: Diagnosis not present

## 2023-02-12 DIAGNOSIS — N182 Chronic kidney disease, stage 2 (mild): Secondary | ICD-10-CM

## 2023-02-12 NOTE — Progress Notes (Signed)
[place]      ADULT   &   ADOLESCENT      INTERNAL MEDICINE  Lucky Cowboy, M.D.          Rance Muir, ANP        Adela Glimpse, FNP  [facility] [address]  [place], [place]. [address] Telephone [phone] Telefax [phone]       Future Appointments  Date Time Provider Department  02/12/2023 11:00 AM Lucky Cowboy, MD [facility]  04/28/2023 11:00 AM Raynelle Dick, NP [facility]  07/29/2023 11:30 AM Lucky Cowboy, MD [facility]  10/21/2023  9:00 AM Raynelle Dick, NP [facility]    History of Present Illness:       This very nice 72 y.o. married  man with HTN, HLD, T2_NIDDM and Vitamin D Deficiency presents for 1 month f/u of poorly controlled Diabetes on Metformin  500 xr x 4 tabs, Glipizide 5 mg bid and Jardiance  25 mg 1 x/day . 1 month ago his A1c = 11.6% . BMI 24.64 . Patient reports CBGs are better  ( less than 150 mg% ) . He denies any "polys" or blurred vision.    Current Outpatient Medications on File Prior to Visit  Medication Sig   aspirin EC 81 MG tablet Take daily.   VITAMIN D  5000 u Take 5,000 Units daily.   VITAMIN B-12  Take by mouth.   dicyclomine 20 MG tablet TAKE 1/2 TO 1 TABLET 4 TIMES A DAY   glipiZIDE 5 MG  tablet Take twice a day with meals if blood sugar is over 120   metFORMIN 500 MG tablet TAKE 2 TABLETS TWICE A DAY    Multiple Vitamin  Take 1 tablet  daily.   20 MG tablet TAKE 1 TABLET  EVERY DAY   telmisartan 20 MG tablet TAKE 1 TABLET DAILY   TRULICITY 4.5 MG/0.5ML  INJECT 4.5MG  UNDER THE SKIN ONCE A WEEK     Allergies  Allergen Reactions   Ace Inhibitors Cough   Statins Other (See Comments)    Elevated LFT's     Problem list  has Hyperlipidemia associated with type 2 diabetes mellitus (HCC); Carotid stenosis; Essential hypertension; Vitamin D deficiency; Neuropathy, diabetic (HCC); DM type 2 (diabetes mellitus, type 2) (HCC); Poor compliance; Overweight (BMI 25.0-29.9); beta  thalassemia minor (trait) anemia; Primary osteoarthritis of left knee; CKD stage 2 due to type 2 diabetes mellitus (HCC); Coronary artery disease involving native coronary artery of native heart without angina pectoris; Aortic Atherosclerosis (HCC) by CT scan  07/30/2022; and Poorly controlled type 2 diabetes mellitus with renal complication (HCC) on their problem list.   Observations/Objective:  BP 110/64   Pulse 96   Temp 98 F (36.7 C)   Ht 5\' 1"  (1.549 m)   Wt 130 lb (59 kg)   SpO2 98%   BMI 24.56 kg/m   HEENT - WNL. Neck - supple.  Chest - Clear equal BS. Cor - Nl HS. RRR w/o sig MGR. PP 1(+). No edema. MS- FROM w/o deformities.  Gait Nl. Neuro -  Nl w/o focal abnormalities.   Assessment and Plan:   1. Poorly controlled type 2 diabetes mellitus with renal complication (HCC)  - COMPLETE METABOLIC PANEL WITH GFR - Glutamic acid decarboxylase auto abs; Future - Insulin [employer] - C-peptide  2. Essential hypertension  - COMPLETE METABOLIC PANEL WITH GFR  3. Type 2 diabetes mellitus with stage 2 chronic kidney disease, without long-term current use  of insulin (HCC)  - COMPLETE METABOLIC PANEL WITH GFR  4. Medication management  - COMPLETE METABOLIC PANEL WITH GFR - Glutamic acid decarboxylase auto abs; Future - Insulin [employer] - C-peptide   - discussed with patient - >checking labs to r/o LADA   Follow Up Instructions:        I discussed the assessment and treatment plan with the patient. The patient was provided an opportunity to ask questions and all were answered. The patient agreed with the plan and demonstrated an understanding of the instructions.       The patient was advised to call back or seek an in-person evaluation if the symptoms worsen or if the condition fails to improve as anticipated.    Marinus Maw, MD

## 2023-02-12 NOTE — Patient Instructions (Signed)
Insulin Resistance  Insulin is a hormone that is made by the pancreas. Insulin allows blood sugar (glucose) to enter the cells in the body. Insulin helps the body use glucose for energy. Normally, the body is insulin sensitive, which means the cells in the body are effective at absorbing glucose. Insulin resistance is when the cells in the body do not respond properly to insulin and are not able to absorb glucose. The pancreas makes more insulin, but over time the body cannot make enough insulin to keep glucose at normal levels. Insulin resistance results in high blood glucose levels (hyperglycemia) and can lead to problems, including: Prediabetes. Type 2 diabetes (diabetes mellitus). Heart disease. High blood pressure (hypertension). Stroke. Polycystic ovary syndrome (PCOS). Nonalcoholic fatty liver disease. What are the causes? The exact cause of insulin resistance is not known. What increases the risk? The following factors may make you more likely to develop insulin resistance: Being overweight or obese, especially if a lot of your weight is in your waist area. Having an inactive (sedentary) lifestyle. Having above-normal glucose levels. Having abnormal cholesterol levels. Having sleep apnea. Being older than age 45. Using steroids. What are the signs or symptoms? This condition usually does not cause symptoms. A waist measurement of more than 35 inches (88.9 cm) for women and more than 40 inches (101.6 cm) for men may be a sign of insulin resistance. How is this diagnosed? There is no test to diagnose insulin resistance. However, your health care provider may diagnose insulin resistance based on: A physical exam. Your medical history. Blood tests that check your blood glucose level. How is this treated? Insulin resistance is treated with nutrition and lifestyle changes. These changes may include: Eating a healthy balance of nutritious foods. Getting more physical  activity. Maintaining a healthy weight. Stopping the use of any tobacco products. Your health care provider will work with you to change your nutrition and lifestyle as needed. In some cases, treatment may also include medicine to improve your insulin sensitivity. Follow these instructions at home: Activity Be physically active. Do moderate-intensity physical activity for at least 30 minutes on 5 or more days of the week, or as told by your health care provider. This could include brisk walking, biking, or water aerobics. Ask your health care provider what activities are safe for you. A mix of physical activities may be best, such as walking, swimming, biking, and strength training. Eating and drinking  Follow a healthy meal plan. This includes eating: Lean proteins. Complex carbohydrates. Examples of these include whole grains, starchy vegetables (potatoes, corn, peas), and beans. Fresh fruits and vegetables. Low-fat dairy products. Healthy fats. Follow instructions from your health care provider about eating or drinking restrictions. Make an appointment to see a diet and nutrition specialist (registered dietitian) to help you create a healthy eating plan. General instructions Check your blood glucose levels as told by your health care provider. Take over-the-counter and prescription medicines only as told by your health care provider. Lose weight as told by your health care provider. Losing 5-7% of your body weight can reverse insulin resistance. Your health care provider can determine how much weight loss is best for you and can help you lose weight safely. Do not use any products that contain nicotine or tobacco. These products include cigarettes, chewing tobacco, and vaping devices, such as e-cigarettes. If you need help quitting, ask your health care provider. Keep all follow-up visits. This is important. Contact a health care provider if: You have trouble losing   weight or  maintaining your goal weight. You gain weight. You have trouble following your prescribed meal plan. You have trouble exercising more. Summary Insulin resistance occurs when cells in the body do not respond properly to insulin and are not able to absorb blood sugar (glucose). The body makes more insulin, but over time the body cannot make enough insulin to keep blood sugar at normal levels. Insulin resistance is treated with nutrition and lifestyle changes, including eating a healthy balance of nutritious foods, getting more physical activity, and maintaining a healthy weight. Your health care provider will work with you to change your nutrition and lifestyle as needed. Treatment may also include medicine to improve your insulin sensitivity. Check your blood glucose levels as told by your health care provider. Keep all follow-up visits. This is important. This information is not intended to replace advice given to you by your health care provider. Make sure you discuss any questions you have with your health care provider. Document Revised: 11/15/2019 Document Reviewed: 11/15/2019 Elsevier Patient Education  2024 [employer].

## 2023-02-14 ENCOUNTER — Other Ambulatory Visit: Payer: Self-pay | Admitting: Nurse Practitioner

## 2023-02-14 DIAGNOSIS — T464X5A Adverse effect of angiotensin-converting-enzyme inhibitors, initial encounter: Secondary | ICD-10-CM

## 2023-02-14 DIAGNOSIS — I10 Essential (primary) hypertension: Secondary | ICD-10-CM

## 2023-02-19 LAB — COMPLETE METABOLIC PANEL WITH GFR
AG Ratio: 1.4 (calc) (ref 1.0–2.5)
ALT: 18 U/L (ref 9–46)
AST: 14 U/L (ref 10–35)
Albumin: 3.8 g/dL (ref 3.6–5.1)
Alkaline phosphatase (APISO): 68 U/L (ref 35–144)
BUN: 17 mg/dL (ref 7–25)
CO2: 29 mmol/L (ref 20–32)
Calcium: 9.2 mg/dL (ref 8.6–10.3)
Chloride: 98 mmol/L (ref 98–110)
Creat: 1.04 mg/dL (ref 0.70–1.28)
Globulin: 2.7 g/dL (ref 1.9–3.7)
Glucose, Bld: 232 mg/dL — ABNORMAL HIGH (ref 65–99)
Potassium: 5.4 mmol/L — ABNORMAL HIGH (ref 3.5–5.3)
Sodium: 133 mmol/L — ABNORMAL LOW (ref 135–146)
Total Bilirubin: 0.4 mg/dL (ref 0.2–1.2)
Total Protein: 6.5 g/dL (ref 6.1–8.1)
eGFR: 75 mL/min/{1.73_m2} (ref >=60)

## 2023-02-19 LAB — C-PEPTIDE: C-Peptide: 1.71 ng/mL (ref 0.80–3.85)

## 2023-02-19 LAB — INSULIN ANTIBODIES, BLOOD: Insulin Antibodies, Human: <0.4 U/mL (ref <0.4)

## 2023-03-20 DIAGNOSIS — M1711 Unilateral primary osteoarthritis, right knee: Secondary | ICD-10-CM | POA: Diagnosis not present

## 2023-03-20 DIAGNOSIS — M1712 Unilateral primary osteoarthritis, left knee: Secondary | ICD-10-CM | POA: Diagnosis not present

## 2023-04-11 ENCOUNTER — Other Ambulatory Visit: Payer: Self-pay | Admitting: Nurse Practitioner

## 2023-04-28 ENCOUNTER — Ambulatory Visit: Payer: Medicare Other | Admitting: Nurse Practitioner

## 2023-04-29 ENCOUNTER — Other Ambulatory Visit: Payer: Self-pay

## 2023-04-29 MED ORDER — METFORMIN HCL 500 MG PO TABS: ORAL_TABLET | ORAL | 0 refills | Status: AC

## 2023-07-29 ENCOUNTER — Ambulatory Visit: Payer: Medicare Other | Admitting: Internal Medicine

## 2023-08-04 ENCOUNTER — Other Ambulatory Visit: Payer: Self-pay | Admitting: Family

## 2023-09-25 ENCOUNTER — Encounter (HOSPITAL_COMMUNITY): Payer: Self-pay

## 2023-10-21 ENCOUNTER — Encounter: Payer: Medicare Other | Admitting: Nurse Practitioner

## 2023-10-29 ENCOUNTER — Encounter: Payer: Medicare Other | Admitting: Nurse Practitioner

## 2023-11-05 DIAGNOSIS — I255 Ischemic cardiomyopathy: Secondary | ICD-10-CM | POA: Insufficient documentation

## 2023-11-05 DIAGNOSIS — E785 Hyperlipidemia, unspecified: Secondary | ICD-10-CM | POA: Insufficient documentation

## 2023-11-05 NOTE — Progress Notes (Unsigned)
  Cardiology Office Note:   Date:  11/05/2023  ID:  William Haney, DOB Nov 22, 1950, MRN [id number] PCP: Tonita Fallow, MD  [facility] Providers Cardiologist:  Lynwood Schilling, MD {  History of Present Illness:   William Haney is a 73 y.o. male ho presents to the clinic today for routine follow up. He has a history of CAD status post CABG in 2008.  He has an ischemic cardiomyopathy and EF of 45 - 50% on the most recent echo in 2022.  ***    *** Past medical history significant for: CAD. LHC 09/01/2006 (positive stress test): Severe two-vessel CAD.  Proximal LAD 70%.  Mid LAD 80%.  LAD distal to D2 90%.  D1 95%.  D2 80%.  Ostial LCx 30%.  Proximal RCA 90%.  EF approximately 45% with hypokinesis of the mid anterior wall and akinesis of the posterior basal wall and mid inferior wall.  CTS consult. CABG x 4 09/03/2006: LIMA to LAD, SVG to D1, SVG to OM, SVG to RPL. Ischemic cardiomyopathy. Echo 06/08/2020: EF 45 to 50%.  Hypokinesis of LV, entire inferior septal wall, inferior wall and inferior lateral wall.  Normal RV function.  Mildly elevated PA pressure.  Mild LAE.  Trivial MR.  Mild to moderate AI. Carotid stenosis. Carotid duplex 05/21/2022: Right ICA 40 to 59%.  Left ICA 1 to 39%.  Bilateral subclavian artery flow was disturbed. Hypertension. Hyperlipidemia. Lipid panel 10/21/2022: LDL 59, HDL 39, TG 107, total 117. T2DM. CKD stage II.     ROS: ***  Studies Reviewed:    EKG:       ***  Risk Assessment/Calculations:   {Does this patient have ATRIAL FIBRILLATION?:[phone]} No BP recorded.  {Refresh Note OR Click here to enter BP  :1}***        Physical Exam:   VS:  There were no vitals taken for this visit.    Wt Readings from Last 3 Encounters:  02/12/23 130 lb (59 kg)  01/21/23 130 lb 6.4 oz (59.1 kg)  12/30/22 126 lb 12.8 oz (57.5 kg)     GEN: Well nourished, well developed in no acute distress NECK: No JVD; No carotid bruits CARDIAC: ***RR, *** murmurs, rubs,  gallops RESPIRATORY:  Clear to auscultation without rales, wheezing or rhonchi  ABDOMEN: Soft, non-tender, non-distended EXTREMITIES:  No edema; No deformity   ASSESSMENT AND PLAN:   CAD.  S/p CABG x 13 August 2006. *** Patient denies chest pain, tightness, pressure. Continue aspirin , metoprolol , Crestor .  Ischemic cardiomyopathy.  ***   Echo March 2022 showed EF 45 to 50%, mildly elevated PA pressure, mild LAE, mild to moderate AI.  Patient denies lower extremity edema, DOE, orthopnea or PND. Euvolemic and well compensated on exam.  Continue metoprolol , telmisartan , Jardiance .  Hypertension: BP was *** today 138/70. Patient denies headaches, dizziness or vision changes. Continue metoprolol , telmisartan .  Carotid stenosis:  The right has 40 - 59% stenosis.  ***   Hyperlipidemia.  ***  LDL August 2024 59, at goal.  Continue rosuvastatin .     Follow up ***  Signed, Lynwood Schilling, MD

## 2023-11-07 ENCOUNTER — Ambulatory Visit: Attending: Cardiology | Admitting: Cardiology

## 2023-11-07 ENCOUNTER — Encounter: Payer: Self-pay | Admitting: Cardiology

## 2023-11-07 VITALS — BP 129/69 | HR 73 | Ht 61.0 in | Wt 138.0 lb

## 2023-11-07 DIAGNOSIS — E785 Hyperlipidemia, unspecified: Secondary | ICD-10-CM

## 2023-11-07 DIAGNOSIS — I255 Ischemic cardiomyopathy: Secondary | ICD-10-CM | POA: Diagnosis not present

## 2023-11-07 DIAGNOSIS — I2510 Atherosclerotic heart disease of native coronary artery without angina pectoris: Secondary | ICD-10-CM | POA: Diagnosis not present

## 2023-11-07 DIAGNOSIS — I10 Essential (primary) hypertension: Secondary | ICD-10-CM | POA: Diagnosis not present

## 2023-11-07 DIAGNOSIS — I6521 Occlusion and stenosis of right carotid artery: Secondary | ICD-10-CM

## 2023-11-07 NOTE — Patient Instructions (Signed)
 Medication Instructions:  Your physician recommends that you continue on your current medications as directed. Please refer to the Current Medication list given to you today.  *If you need a refill on your cardiac medications before your next appointment, please call your pharmacy*  Lab Work: NONE If you have labs (blood work) drawn today and your tests are completely normal, you will receive your results only by: MyChart Message (if you have MyChart) OR A paper copy in the mail If you have any lab test that is abnormal or we need to change your treatment, we will call you to review the results.  Testing/Procedures: Echocardiogram Your physician has requested that you have an echocardiogram. Echocardiography is a painless test that uses sound waves to create images of your heart. It provides your doctor with information about the size and shape of your heart and how well your heart's chambers and valves are working. This procedure takes approximately one hour. There are no restrictions for this procedure. Please do NOT wear cologne, perfume, aftershave, or lotions (deodorant is allowed). Please arrive 15 minutes prior to your appointment time.  Please note: We ask at that you not bring children with you during ultrasound (echo/ vascular) testing. Due to room size and safety concerns, children are not allowed in the ultrasound rooms during exams. Our front office staff cannot provide observation of children in our lobby area while testing is being conducted. An adult accompanying a patient to their appointment will only be allowed in the ultrasound room at the discretion of the ultrasound technician under special circumstances. We apologize for any inconvenience.  Carotid Doppler Your physician has requested that you have a carotid duplex. This test is an ultrasound of the carotid arteries in your neck. It looks at blood flow through these arteries that supply the brain with blood. Allow one hour  for this exam. There are no restrictions or special instructions.   Follow-Up: At [facility], you and your health needs are our priority.  As part of our continuing mission to provide you with exceptional heart care, our providers are all part of one team.  This team includes your primary Cardiologist (physician) and Advanced Practice Providers or APPs (Physician Assistants and Nurse Practitioners) who all work together to provide you with the care you need, when you need it.  Your next appointment:   1 year  Provider:   [place], MD  We recommend signing up for the patient portal called MyChart.  Sign up information is provided on this After Visit Summary.  MyChart is used to connect with patients for Virtual Visits (Telemedicine).  Patients are able to view lab/test results, encounter notes, upcoming appointments, etc.  Non-urgent messages can be sent to your provider as well.   To learn more about what you can do with MyChart, go to [email or web address].

## 2023-11-11 NOTE — Progress Notes (Signed)
 Order(s) created erroneously. Erroneous order ID: [id number]  Order moved by: CHART CORRECTION ANALYST NINE, IDENTITY  Order move date/time: 11/11/2023 9:27 AM  Source Patient: [id number]  Source Contact: 11/07/2023  Destination Patient: [id number]  Destination Contact: 08/21/2022

## 2023-12-18 ENCOUNTER — Ambulatory Visit (HOSPITAL_COMMUNITY)
Admission: RE | Admit: 2023-12-18 | Discharge: 2023-12-18 | Disposition: A | Discharge status: Home / Self Care | Source: Ambulatory Visit | Attending: Cardiology | Admitting: Cardiology

## 2023-12-18 ENCOUNTER — Ambulatory Visit (HOSPITAL_BASED_OUTPATIENT_CLINIC_OR_DEPARTMENT_OTHER)
Admission: RE | Admit: 2023-12-18 | Discharge: 2023-12-18 | Disposition: A | Discharge status: Home / Self Care | Source: Ambulatory Visit | Attending: Cardiology | Admitting: Cardiology

## 2023-12-18 DIAGNOSIS — I6521 Occlusion and stenosis of right carotid artery: Secondary | ICD-10-CM

## 2023-12-18 DIAGNOSIS — I2510 Atherosclerotic heart disease of native coronary artery without angina pectoris: Secondary | ICD-10-CM

## 2023-12-18 DIAGNOSIS — I429 Cardiomyopathy, unspecified: Secondary | ICD-10-CM

## 2023-12-18 DIAGNOSIS — I255 Ischemic cardiomyopathy: Secondary | ICD-10-CM | POA: Insufficient documentation

## 2023-12-18 LAB — ECHOCARDIOGRAM COMPLETE
AR max vel: 1.09 cm2
AV Area VTI: 1.04 cm2
AV Area mean vel: 0.89 cm2
AV Mean grad: 7 mmHg
AV Peak grad: 11.2 mmHg
Ao pk vel: 1.67 m/s
Area-P 1/2: 4.74 cm2
S' Lateral: 4.84 cm

## 2023-12-18 MED ORDER — PERFLUTREN LIPID MICROSPHERE
1.0000 mL | INTRAVENOUS | Status: AC | PRN
  Administered 2023-12-18: 4 mL via INTRAVENOUS

## 2023-12-19 ENCOUNTER — Ambulatory Visit: Payer: Self-pay | Admitting: Cardiology

## 2024-01-01 ENCOUNTER — Other Ambulatory Visit: Payer: Self-pay

## 2024-01-01 ENCOUNTER — Telehealth: Payer: Self-pay | Admitting: Cardiology

## 2024-01-01 DIAGNOSIS — I255 Ischemic cardiomyopathy: Secondary | ICD-10-CM

## 2024-01-01 NOTE — Telephone Encounter (Signed)
 Pt was returning nurse regarding results since he received the letter to contact the office after they made 3 attempts to call pt. He's requesting a callback. Please advise

## 2024-01-01 NOTE — Progress Notes (Signed)
 Patient identification verified by 2 forms.  Went over Echo and Carotid duplex results. Order for repeat 1 year (Oct 2026).  Pt states just schedule the appointment, any day and any time. I am retired, I will make it work    Patient agrees with plan, no questions at this time

## 2024-01-01 NOTE — Telephone Encounter (Signed)
 Patient identification verified by 2 forms.  Went over Echo and Carotid duplex results. Order for repeat 1 year (Oct 2026).  Pt states just schedule the appointment, any day and any time. I am retired, I will make it work    Patient agrees with plan, no questions at this time
